# Patient Record
Sex: Female | Born: 1945 | Race: Black or African American | Hispanic: No | Smoking: Never smoker
Health system: Southern US, Community
[De-identification: ages and names within clinical notes are randomized; demographics above are authoritative.]

## PROBLEM LIST (undated history)

## (undated) DIAGNOSIS — M8580 Other specified disorders of bone density and structure, unspecified site: Secondary | ICD-10-CM

## (undated) DIAGNOSIS — E785 Hyperlipidemia, unspecified: Secondary | ICD-10-CM

## (undated) DIAGNOSIS — K625 Hemorrhage of anus and rectum: Secondary | ICD-10-CM

## (undated) DIAGNOSIS — M1990 Unspecified osteoarthritis, unspecified site: Secondary | ICD-10-CM

## (undated) DIAGNOSIS — K219 Gastro-esophageal reflux disease without esophagitis: Secondary | ICD-10-CM

## (undated) DIAGNOSIS — R1310 Dysphagia, unspecified: Secondary | ICD-10-CM

## (undated) DIAGNOSIS — Z860100 Personal history of colon polyps, unspecified: Secondary | ICD-10-CM

## (undated) DIAGNOSIS — R7303 Prediabetes: Secondary | ICD-10-CM

## (undated) DIAGNOSIS — F419 Anxiety disorder, unspecified: Secondary | ICD-10-CM

## (undated) DIAGNOSIS — C801 Malignant (primary) neoplasm, unspecified: Secondary | ICD-10-CM

## (undated) DIAGNOSIS — I10 Essential (primary) hypertension: Secondary | ICD-10-CM

## (undated) DIAGNOSIS — D869 Sarcoidosis, unspecified: Secondary | ICD-10-CM

## (undated) DIAGNOSIS — G4733 Obstructive sleep apnea (adult) (pediatric): Secondary | ICD-10-CM

## (undated) HISTORY — PX: BRONCHOSCOPY: SUR163

## (undated) HISTORY — DX: Essential (primary) hypertension: I10

## (undated) HISTORY — DX: Personal history of colonic polyps: Z86.010

## (undated) HISTORY — DX: Hyperlipidemia, unspecified: E78.5

## (undated) HISTORY — DX: Obstructive sleep apnea (adult) (pediatric): G47.33

## (undated) HISTORY — DX: Unspecified osteoarthritis, unspecified site: M19.90

## (undated) HISTORY — DX: Personal history of colon polyps, unspecified: Z86.0100

## (undated) HISTORY — DX: Gastro-esophageal reflux disease without esophagitis: K21.9

## (undated) HISTORY — DX: Dysphagia, unspecified: R13.10

## (undated) HISTORY — DX: Hemorrhage of anus and rectum: K62.5

## (undated) HISTORY — PX: TOTAL ABDOMINAL HYSTERECTOMY: SHX209

## (undated) HISTORY — DX: Other specified disorders of bone density and structure, unspecified site: M85.80

## (undated) HISTORY — DX: Sarcoidosis, unspecified: D86.9

---

## 2000-09-05 ENCOUNTER — Other Ambulatory Visit: Admission: RE | Admit: 2000-09-05 | Discharge: 2000-09-05 | Payer: Self-pay | Admitting: Obstetrics and Gynecology

## 2001-02-26 ENCOUNTER — Encounter: Admission: RE | Admit: 2001-02-26 | Discharge: 2001-02-26 | Payer: Self-pay | Admitting: *Deleted

## 2001-02-26 ENCOUNTER — Encounter: Payer: Self-pay | Admitting: *Deleted

## 2001-12-15 ENCOUNTER — Other Ambulatory Visit: Admission: RE | Admit: 2001-12-15 | Discharge: 2001-12-15 | Payer: Self-pay | Admitting: Obstetrics and Gynecology

## 2002-12-18 ENCOUNTER — Other Ambulatory Visit: Admission: RE | Admit: 2002-12-18 | Discharge: 2002-12-18 | Payer: Self-pay | Admitting: Obstetrics and Gynecology

## 2003-01-19 ENCOUNTER — Encounter: Payer: Self-pay | Admitting: *Deleted

## 2003-01-19 ENCOUNTER — Ambulatory Visit (HOSPITAL_COMMUNITY): Admission: RE | Admit: 2003-01-19 | Discharge: 2003-01-19 | Payer: Self-pay | Admitting: *Deleted

## 2003-03-03 ENCOUNTER — Ambulatory Visit (HOSPITAL_COMMUNITY): Admission: RE | Admit: 2003-03-03 | Discharge: 2003-03-03 | Payer: Self-pay | Admitting: Cardiology

## 2003-03-03 ENCOUNTER — Encounter (INDEPENDENT_AMBULATORY_CARE_PROVIDER_SITE_OTHER): Payer: Self-pay | Admitting: Cardiology

## 2003-03-15 ENCOUNTER — Ambulatory Visit (HOSPITAL_COMMUNITY): Admission: RE | Admit: 2003-03-15 | Discharge: 2003-03-15 | Payer: Self-pay | Admitting: Cardiology

## 2003-03-15 ENCOUNTER — Encounter (INDEPENDENT_AMBULATORY_CARE_PROVIDER_SITE_OTHER): Payer: Self-pay | Admitting: Cardiology

## 2003-06-11 ENCOUNTER — Ambulatory Visit: Payer: Self-pay

## 2004-04-20 ENCOUNTER — Encounter: Admission: RE | Admit: 2004-04-20 | Discharge: 2004-04-20 | Payer: Self-pay | Admitting: Internal Medicine

## 2004-09-11 ENCOUNTER — Other Ambulatory Visit: Admission: RE | Admit: 2004-09-11 | Discharge: 2004-09-11 | Payer: Self-pay | Admitting: Obstetrics and Gynecology

## 2004-11-21 ENCOUNTER — Encounter: Admission: RE | Admit: 2004-11-21 | Discharge: 2004-11-21 | Payer: Self-pay | Admitting: Cardiology

## 2004-12-06 ENCOUNTER — Ambulatory Visit: Payer: Self-pay | Admitting: Critical Care Medicine

## 2004-12-08 ENCOUNTER — Ambulatory Visit: Admission: RE | Admit: 2004-12-08 | Discharge: 2004-12-08 | Payer: Self-pay | Admitting: Critical Care Medicine

## 2004-12-08 ENCOUNTER — Encounter: Payer: Self-pay | Admitting: Critical Care Medicine

## 2004-12-12 ENCOUNTER — Ambulatory Visit (HOSPITAL_COMMUNITY): Admission: RE | Admit: 2004-12-12 | Discharge: 2004-12-12 | Payer: Self-pay | Admitting: Critical Care Medicine

## 2004-12-12 ENCOUNTER — Encounter (INDEPENDENT_AMBULATORY_CARE_PROVIDER_SITE_OTHER): Payer: Self-pay | Admitting: Specialist

## 2004-12-12 ENCOUNTER — Ambulatory Visit: Payer: Self-pay | Admitting: Critical Care Medicine

## 2004-12-28 ENCOUNTER — Ambulatory Visit: Payer: Self-pay | Admitting: Critical Care Medicine

## 2005-01-31 ENCOUNTER — Ambulatory Visit: Payer: Self-pay | Admitting: Critical Care Medicine

## 2005-02-27 ENCOUNTER — Ambulatory Visit: Payer: Self-pay | Admitting: Critical Care Medicine

## 2005-03-29 ENCOUNTER — Ambulatory Visit: Payer: Self-pay | Admitting: Critical Care Medicine

## 2005-04-18 ENCOUNTER — Ambulatory Visit: Payer: Self-pay | Admitting: Critical Care Medicine

## 2005-06-08 ENCOUNTER — Ambulatory Visit: Payer: Self-pay | Admitting: Critical Care Medicine

## 2005-08-06 ENCOUNTER — Ambulatory Visit: Payer: Self-pay | Admitting: Critical Care Medicine

## 2006-09-06 ENCOUNTER — Ambulatory Visit: Payer: Self-pay | Admitting: Critical Care Medicine

## 2006-09-11 ENCOUNTER — Encounter: Payer: Self-pay | Admitting: Critical Care Medicine

## 2006-09-11 ENCOUNTER — Ambulatory Visit: Payer: Self-pay | Admitting: Internal Medicine

## 2006-10-01 ENCOUNTER — Ambulatory Visit: Payer: Self-pay | Admitting: Critical Care Medicine

## 2006-10-11 ENCOUNTER — Ambulatory Visit: Payer: Self-pay | Admitting: Critical Care Medicine

## 2006-11-06 ENCOUNTER — Ambulatory Visit: Payer: Self-pay | Admitting: Critical Care Medicine

## 2006-11-06 LAB — CONVERTED CEMR LAB
ALT: 19 units/L (ref 0–40)
AST: 27 units/L (ref 0–37)
Albumin: 3.7 g/dL (ref 3.5–5.2)
Alkaline Phosphatase: 159 units/L — ABNORMAL HIGH (ref 39–117)
Basophils Absolute: 0 10*3/uL (ref 0.0–0.1)
Basophils Relative: 0.8 % (ref 0.0–1.0)
Bilirubin, Direct: 0.1 mg/dL (ref 0.0–0.3)
Eosinophils Absolute: 0.2 10*3/uL (ref 0.0–0.6)
Eosinophils Relative: 5.1 % — ABNORMAL HIGH (ref 0.0–5.0)
HCT: 36.6 % (ref 36.0–46.0)
Hemoglobin: 12.4 g/dL (ref 12.0–15.0)
Lymphocytes Relative: 30.8 % (ref 12.0–46.0)
MCHC: 33.8 g/dL (ref 30.0–36.0)
MCV: 82.3 fL (ref 78.0–100.0)
Monocytes Absolute: 0.4 10*3/uL (ref 0.2–0.7)
Monocytes Relative: 7.6 % (ref 3.0–11.0)
Neutro Abs: 2.8 10*3/uL (ref 1.4–7.7)
Neutrophils Relative %: 55.7 % (ref 43.0–77.0)
Platelets: 286 10*3/uL (ref 150–400)
RBC: 4.45 M/uL (ref 3.87–5.11)
RDW: 14.3 % (ref 11.5–14.6)
Total Bilirubin: 0.7 mg/dL (ref 0.3–1.2)
Total Protein: 8.9 g/dL — ABNORMAL HIGH (ref 6.0–8.3)
WBC: 4.9 10*3/uL (ref 4.5–10.5)

## 2006-12-04 ENCOUNTER — Ambulatory Visit: Payer: Self-pay | Admitting: Critical Care Medicine

## 2006-12-04 LAB — CONVERTED CEMR LAB
Basophils Absolute: 0 10*3/uL (ref 0.0–0.1)
Basophils Relative: 0.7 % (ref 0.0–1.0)
Eosinophils Absolute: 0.2 10*3/uL (ref 0.0–0.6)
Eosinophils Relative: 5.8 % — ABNORMAL HIGH (ref 0.0–5.0)
HCT: 34.4 % — ABNORMAL LOW (ref 36.0–46.0)
Hemoglobin: 11.8 g/dL — ABNORMAL LOW (ref 12.0–15.0)
Lymphocytes Relative: 34.7 % (ref 12.0–46.0)
MCHC: 34.3 g/dL (ref 30.0–36.0)
MCV: 83 fL (ref 78.0–100.0)
Monocytes Absolute: 0.2 10*3/uL (ref 0.2–0.7)
Monocytes Relative: 6 % (ref 3.0–11.0)
Neutro Abs: 2.1 10*3/uL (ref 1.4–7.7)
Neutrophils Relative %: 52.8 % (ref 43.0–77.0)
Platelets: 227 10*3/uL (ref 150–400)
RBC: 4.15 M/uL (ref 3.87–5.11)
RDW: 15.7 % — ABNORMAL HIGH (ref 11.5–14.6)
WBC: 3.8 10*3/uL — ABNORMAL LOW (ref 4.5–10.5)

## 2007-01-21 ENCOUNTER — Ambulatory Visit: Payer: Self-pay | Admitting: Critical Care Medicine

## 2007-03-21 DIAGNOSIS — G4733 Obstructive sleep apnea (adult) (pediatric): Secondary | ICD-10-CM | POA: Insufficient documentation

## 2007-03-21 DIAGNOSIS — D869 Sarcoidosis, unspecified: Secondary | ICD-10-CM | POA: Insufficient documentation

## 2007-03-21 DIAGNOSIS — D860 Sarcoidosis of lung: Secondary | ICD-10-CM

## 2007-03-21 DIAGNOSIS — I10 Essential (primary) hypertension: Secondary | ICD-10-CM | POA: Insufficient documentation

## 2007-03-21 DIAGNOSIS — I129 Hypertensive chronic kidney disease with stage 1 through stage 4 chronic kidney disease, or unspecified chronic kidney disease: Secondary | ICD-10-CM | POA: Insufficient documentation

## 2007-03-21 DIAGNOSIS — E785 Hyperlipidemia, unspecified: Secondary | ICD-10-CM | POA: Insufficient documentation

## 2007-03-21 HISTORY — DX: Obstructive sleep apnea (adult) (pediatric): G47.33

## 2007-03-21 HISTORY — DX: Sarcoidosis of lung: D86.0

## 2007-03-21 HISTORY — DX: Hypertensive chronic kidney disease with stage 1 through stage 4 chronic kidney disease, or unspecified chronic kidney disease: I12.9

## 2007-03-21 HISTORY — DX: Essential (primary) hypertension: I10

## 2007-05-27 ENCOUNTER — Encounter: Admission: RE | Admit: 2007-05-27 | Discharge: 2007-05-27 | Payer: Self-pay | Admitting: Internal Medicine

## 2007-06-11 ENCOUNTER — Ambulatory Visit: Payer: Self-pay | Admitting: Critical Care Medicine

## 2007-09-17 ENCOUNTER — Ambulatory Visit: Payer: Self-pay | Admitting: Critical Care Medicine

## 2007-09-22 ENCOUNTER — Encounter: Payer: Self-pay | Admitting: Critical Care Medicine

## 2007-10-06 ENCOUNTER — Ambulatory Visit: Payer: Self-pay | Admitting: Critical Care Medicine

## 2008-02-05 ENCOUNTER — Ambulatory Visit: Payer: Self-pay | Admitting: Critical Care Medicine

## 2008-02-16 ENCOUNTER — Encounter: Payer: Self-pay | Admitting: Critical Care Medicine

## 2008-02-17 ENCOUNTER — Encounter: Payer: Self-pay | Admitting: Critical Care Medicine

## 2008-02-18 ENCOUNTER — Encounter: Payer: Self-pay | Admitting: Critical Care Medicine

## 2008-06-02 ENCOUNTER — Ambulatory Visit: Payer: Self-pay | Admitting: Critical Care Medicine

## 2009-07-06 ENCOUNTER — Encounter: Admission: RE | Admit: 2009-07-06 | Discharge: 2009-07-14 | Payer: Self-pay | Admitting: Internal Medicine

## 2009-07-12 ENCOUNTER — Encounter: Admission: RE | Admit: 2009-07-12 | Discharge: 2009-07-12 | Payer: Self-pay | Admitting: Critical Care Medicine

## 2010-03-08 ENCOUNTER — Ambulatory Visit (HOSPITAL_BASED_OUTPATIENT_CLINIC_OR_DEPARTMENT_OTHER): Admission: RE | Admit: 2010-03-08 | Discharge: 2010-03-08 | Payer: Self-pay | Admitting: Neurology

## 2010-03-08 ENCOUNTER — Encounter: Payer: Self-pay | Admitting: Internal Medicine

## 2010-03-11 ENCOUNTER — Ambulatory Visit: Payer: Self-pay | Admitting: Internal Medicine

## 2010-05-03 ENCOUNTER — Ambulatory Visit: Payer: Self-pay | Admitting: Internal Medicine

## 2010-05-06 DIAGNOSIS — E118 Type 2 diabetes mellitus with unspecified complications: Secondary | ICD-10-CM

## 2010-05-06 DIAGNOSIS — E119 Type 2 diabetes mellitus without complications: Secondary | ICD-10-CM | POA: Insufficient documentation

## 2010-05-06 DIAGNOSIS — N182 Chronic kidney disease, stage 2 (mild): Secondary | ICD-10-CM | POA: Insufficient documentation

## 2010-05-06 DIAGNOSIS — E1122 Type 2 diabetes mellitus with diabetic chronic kidney disease: Secondary | ICD-10-CM | POA: Insufficient documentation

## 2010-05-06 HISTORY — DX: Type 2 diabetes mellitus without complications: E11.9

## 2010-06-06 ENCOUNTER — Encounter: Payer: Self-pay | Admitting: Internal Medicine

## 2010-06-13 ENCOUNTER — Ambulatory Visit
Admission: RE | Admit: 2010-06-13 | Discharge: 2010-06-13 | Payer: Self-pay | Source: Home / Self Care | Attending: Internal Medicine | Admitting: Internal Medicine

## 2010-07-04 ENCOUNTER — Encounter: Payer: Self-pay | Admitting: Internal Medicine

## 2010-07-04 NOTE — Assessment & Plan Note (Signed)
Summary: sleep/cb   Primary Provider/Referring Provider:  Dr. Dorothyann Peng  CC:  Sleep Consult-Dr. Vela Prose.  History of Present Illness: May 03, 2010- 65 yoF referred courtesy of Dr lewitt for concern of insomnia and sleep apnea. Her complaint is that she sleeps no more than 3-5 hrs/ night. She is alone and not told if she snores or kicks. She hasn't felt she slept well in about 8 years, blaming work stress until problems persisted after she retired in 2006. Dx'd with sleep apnea in 2004. Wears CPAP irregularly. Comfortable in bed.  Bedtime 10PM 30 minutes sleep latency, wakes 33-4 x/ night before up between 5-7AM. NPSG10/5/11- AHI 19.8/ hr moderate obstructive apnea. CPAP titrated to 10 cwp for AHI 0.8/hr.  Preventive Screening-Counseling & Management  Alcohol-Tobacco     Smoking Status: never  Current Medications (verified): 1)  Lescol Xl 80 Mg  Tb24 (Fluvastatin Sodium) .Marland Kitchen.. 1 Tab By Mouth Daily 2)  Felodipine 10 Mg Xr24h-Tab (Felodipine) .... Take 1 By Mouth Once Daily 3)  Vitamin D3 2000 Unit Caps (Cholecalciferol) .... Take 1 By Mouth Once Daily 4)  Glucosamine-Chondroitin  Caps (Glucosamine-Chondroit-Vit C-Mn) .... Take 2 By Mouth Once Daily 5)  Fish Oil 1000 Mg Caps (Omega-3 Fatty Acids) .... Take 2 By Mouth Once Daily 6)  Cpap .... 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 CPAP 10 HYPERTENSION (ICD-401.9) HYPERLIPIDEMIA (ICD-272.4) Diabetes, Type 2  Past Surgical History: Total Abdominal Hysterectomy Bronchoscopy- dx'd sarcoid  Family History: Allergies: sister,mother,daughter Asthma: daughter Heart Disease: irregular heartbeats, uncle and sister both havehad by heart  surgery(blockages) Father died accidental , did snore Mother- living  Social History: Divorced; lives with 3 daughters non smoker  No ETOH  Review of Systems      See HPI       The patient complains of shortness of breath with activity, non-productive cough, irregular heartbeats, indigestion, nasal congestion/difficulty breathing through nose, ear ache, and joint stiffness or pain.  The patient denies shortness of breath at rest, productive cough, coughing up blood, chest pain, acid heartburn, loss of appetite, weight change, abdominal pain, difficulty swallowing, sore throat, tooth/dental problems, headaches, sneezing, itching, anxiety, depression, hand/feet swelling, rash, change in color of mucus, and fever.    Vital Signs:  Patient profile:   65 year old female Height:      59 inches Weight:      157 pounds BMI:     31.82 O2 Sat:      97 % on Room air Pulse rate:   71 / minute BP sitting:   128 / 66  (left arm) Cuff size:   regular  Vitals Entered By: Reynaldo Minium CMA (May 03, 2010 10:37 AM)  O2 Flow:  Room air CC: Sleep Consult-Dr. Vela Prose   Physical Exam  Additional Exam:  General: A/Ox3; pleasant and cooperative, NAD, medium build SKIN: no rash, lesions NODES: no lymphadenopathy HEENT: [place]/AT, EOM- WNL, Conjuctivae- clear, PERRLA, TM-WNL, Nose- clear, Throat- clear and wnl NECK: Supple w/ fair ROM, JVD- none, normal carotid impulses w/o bruits Thyroid- normal to palpation CHEST: Clear to P&A HEART: RRR, no m/g/r heard ABDOMEN: No hepatosplenomegaly found. ZOX:WRUE, nl pulses, no edema  NEURO: Grossly intact to observation  Impression & Recommendations:  Problem # 1:  OBSTRUCTIVE SLEEP APNEA (ICD-327.23) We will start CPAP again. Her original company [employer], is out of business here. We will address her insomnia w/ a trial of temazepam, noting that she has found some meds including ambien have seemed to depress her. We discussed medical concerns,  importance of weight control and responsibility to drive carefully.   Medications Added to Medication List This Visit: 1)  Felodipine 10 Mg Xr24h-tab (Felodipine) .... Take 1 by mouth once daily 2)  Vitamin D3 2000 Unit Caps (Cholecalciferol) .... Take 1 by mouth once daily 3)  Glucosamine-chondroitin Caps (Glucosamine-chondroit-vit c-mn) .... Take 2 by mouth once daily 4)  Fish Oil 1000 Mg Caps (Omega-3 fatty acids) .... Take 2 by mouth once daily 5)  Cpap  .... Set on 2 6)  Temazepam 15 Mg Caps (Temazepam) .Marland Kitchen.. 1 for sleep if needed  Other Orders: Consultation Level IV (16109) DME Referral (DME)  Patient Instructions: 1)  Please schedule a follow-up appointment in 1 month. 2)  See PCC to start CPAP 3)  Script for trial of temaxzepam as a sleep aid to try if you wish.  4)  cc Dr Vela Prose, Dr Allyne Gee Prescriptions: TEMAZEPAM 15 MG CAPS (TEMAZEPAM) 1 for sleep if needed  #10 x 0   Entered and Authorized by:   Waymon Budge MD   Signed by:   Waymon Budge MD on 05/03/2010   Method used:   Print then Give to Patient   RxID:   [id number]

## 2010-07-06 NOTE — Assessment & Plan Note (Signed)
Summary: rov 1 month///kp   Primary Provider/Referring Provider:  Dr. Dorothyann Peng  CC:  1 month follow up visit-sleep; Using CPAP each night but wondering if pressure is too much..  History of Present Illness: History of Present Illness: May 03, 2010- 64 yoF referred courtesy of Dr lewitt for concern of insomnia and sleep apnea. Her complaint is that she sleeps no more than 3-5 hrs/ night. She is alone and not told if she snores or kicks. She hasn't felt she slept well in about 8 years, blaming work stress until problems persisted after she retired in 2006. Dx'd with sleep apnea in 2004. Wears CPAP irregularly. Comfortable in bed.  Bedtime 10PM 30 minutes sleep latency, wakes 33-4 x/ night before up between 5-7AM. NPSG10/5/11- AHI 19.8/ hr moderate obstructive apnea. CPAP titrated to 10 cwp for AHI 0.8/hr.  June 13, 2010- Insomnia, OSA Nurse-CC: 1 month follow up visit-sleep; Using CPAP each night but wondering if pressure is too much. At last visit we restarted CPAP and gave temazepam. CPAP seems too high with aerophagia. She is using it all night, every night and recognizes she is sleeping better with it. Temazepam didn't help sleep and caused crying spells similar to other sleep meds. We discussed trial of Silenor as a different chemistry worth trying.     Preventive Screening-Counseling & Management  Alcohol-Tobacco     Smoking Status: never  Current Medications (verified): 1)  Lescol Xl 80 Mg  Tb24 (Fluvastatin Sodium) .Marland Kitchen.. 1 Tab By Mouth Daily 2)  Felodipine 10 Mg Xr24h-Tab (Felodipine) .... Take 1 By Mouth Once Daily 3)  Vitamin D3 2000 Unit Caps (Cholecalciferol) .... Take 1 By Mouth Once Daily 4)  Glucosamine-Chondroitin  Caps (Glucosamine-Chondroit-Vit C-Mn) .... Take 2 By Mouth Once Daily 5)  Fish Oil 1000 Mg Caps (Omega-3 Fatty Acids) .... Take 2 By Mouth Once Daily 6)  Cpap .... Set On 2  Allergies (verified): No Known Drug Allergies  Vital Signs:  Patient  profile:   65 year old female Height:      59 inches Weight:      155.50 pounds BMI:     31.52 O2 Sat:      98 % on Room air Pulse rate:   73 / minute BP sitting:   136 / 70  (left arm) Cuff size:   regular  Vitals Entered By: Reynaldo Minium CMA (June 13, 2010 11:08 AM)  O2 Flow:  Room air CC: 1 month follow up visit-sleep; Using CPAP each night but wondering if pressure is too much.   Physical Exam  Additional Exam:  General: A/Ox3; pleasant and cooperative, NAD, medium build, very intelligent woman.  SKIN: no rash, lesions NODES: no lymphadenopathy HEENT: [place]/AT, EOM- WNL, Conjuctivae- clear, PERRLA, TM-WNL, Nose- clear, Throat- clear and wnl, Mallampati  II-III NECK: Supple w/ fair ROM, JVD- none, normal carotid impulses w/o bruits Thyroid- normal to palpation CHEST: Clear to P&A HEART: RRR, no m/g/r heard ABDOMEN: No hepatosplenomegaly found. KVQ:QVZD, nl pulses, no edema  NEURO: Grossly intact to observation      Impression & Recommendations:  Problem # 1:  OBSTRUCTIVE SLEEP APNEA (ICD-327.23) Compliance and control were good on 10 cwp as of download June 12, 2010.  We will reduce her pressure to 9 to reduce air swallowing.  Her chronic insomnia is not just related to OSA. We discussed sleep hygiene and will give a trial of Silenor samples.   Problem # 2:  SARCOIDOSIS (ICD-135) This is clincially inactive. She does  notice some seasonal congestion not likely related to any active concern now, but she notices it when she has to take cpap off to cough.   Medications Added to Medication List This Visit: 1)  Cpap 10 Cwp  2)  Cpap 9 Cwp Sms   Other Orders: Est. Patient Level III (52841) DME Referral (DME) Influenza Vaccine MCR (32440)  Patient Instructions: 1)  Please schedule a follow-up appointment in 4 months. 2)  We will have your CPAP pressure reduced to 9.  3)  Try samples of Silenor 6 mg, one each night, for sleep.  4)  Flu vax   Immunizations  Administered:  Influenza Vaccine # 1:    Vaccine Type: Fluvax MCR    Site: left deltoid    Mfr: GlaxoSmithKline    Dose: 0.5 ml    Route: IM    Given by: Carver Fila    Exp. Date: 12/02/2010    Lot #: [id number]  Flu Vaccine Consent Questions:    Do you have a history of severe allergic reactions to this vaccine? no    Any prior history of allergic reactions to egg and/or gelatin? no    Do you have a sensitivity to the preservative Thimersol? no    Do you have a past history of Guillan-Barre Syndrome? no    Do you currently have an acute febrile illness? no    Have you ever had a severe reaction to latex? no    Vaccine information given and explained to patient? yes    Are you currently pregnant? no

## 2010-10-06 ENCOUNTER — Encounter: Payer: Self-pay | Admitting: Internal Medicine

## 2010-10-10 ENCOUNTER — Encounter: Payer: Self-pay | Admitting: Internal Medicine

## 2010-10-10 ENCOUNTER — Ambulatory Visit (INDEPENDENT_AMBULATORY_CARE_PROVIDER_SITE_OTHER)
Admission: RE | Admit: 2010-10-10 | Discharge: 2010-10-10 | Disposition: A | Discharge status: Home / Self Care | Payer: Medicare Other | Source: Ambulatory Visit | Attending: Internal Medicine | Admitting: Internal Medicine

## 2010-10-10 ENCOUNTER — Ambulatory Visit (INDEPENDENT_AMBULATORY_CARE_PROVIDER_SITE_OTHER): Payer: Medicare Other | Admitting: Internal Medicine

## 2010-10-10 VITALS — BP 146/76 | HR 65 | Ht 59.0 in | Wt 153.8 lb

## 2010-10-10 DIAGNOSIS — G4733 Obstructive sleep apnea (adult) (pediatric): Secondary | ICD-10-CM

## 2010-10-10 DIAGNOSIS — D869 Sarcoidosis, unspecified: Secondary | ICD-10-CM

## 2010-10-10 NOTE — Assessment & Plan Note (Signed)
Control is good, but there is room to drop her pressure one more step and I would like to see if that can make her airswallowing and cough better. Will reduce pressure to 8.

## 2010-10-10 NOTE — Patient Instructions (Signed)
Orders-   1) PCC- reduce CPAP to 8 for comfort                   2) CXR- for dx sarcoid

## 2010-10-10 NOTE — Progress Notes (Signed)
  Subjective:    Patient ID: Mariah Burns, female    DOB: [birth year], 65 y.o.   MRN: [id number]  HPI 10/10/10- 89 yoF followed with hx OSA and Sarcoid, complicated by HTN and DM.  Last here June 13, 2010- We had reduced CPAP to 9 and compliance check looked good on that.  Mentions some vertigo on first getting up in AM. Asks about trying a little lower pressure.  Sometimes a nonproductive morning cough - discussed indication for cxr.    Review of Systems Constitutional:   No weight loss, night sweats,  Fevers, chills, fatigue, lassitude. HEENT:   No headaches,  Difficulty swallowing,  Tooth/dental problems,  Sore throat,                No sneezing, itching, ear ache, nasal congestion, post nasal drip,   CV:  No chest pain,  Orthopnea, PND, swelling in lower extremities, anasarca, dizziness, palpitations  GI  No heartburn, indigestion, abdominal pain, nausea, vomiting, diarrhea, change in bowel habits, loss of appetite  Resp: No shortness of breath with exertion or at rest.  No excess mucus, No coughing up of blood.  No change in color of mucus.  No wheezing.    Skin: no rash or lesions.  GU: no dysuria, change in color of urine, no urgency or frequency.  No flank pain.  MS:  .  No decreased range of motion.  No back pain.  Psych:  No change in mood or affect. No depression or anxiety.  No memory loss.      Objective:   Physical Exam General- Alert, Oriented, Affect-appropriate, Distress- none acute  Skin- rash-none, lesions- none, excoriation- none  Lymphadenopathy- none  Head- atraumatic  Eyes- Gross vision intact, PERRLA, conjunctivae clear, secretions  Ears- Hearing, canals, Tm L ,   R ,-normal  Nose- Clear,No-Septal dev, mucus, polyps, erosion, perforation   Throat- Mallampati II-III, mucosa clear , drainage- none, tonsils- atrophic  Neck- flexible , trachea midline, no stridor , thyroid nl, carotid no bruit  Chest - symmetrical excursion , unlabored   Heart/CV- RRR , no murmur , no gallop  , no rub, nl s1 s2                     - JVD- none , edema- none, stasis changes- none, varices- none     Lung- clear to P&A, wheeze- none, cough- none , dullness-none, rub- none     Chest wall-  Abd- tender-no, distended-no, bowel sounds-present, HSM- no  Br/ Gen/ Rectal- Not done, not indicated  Extrem- cyanosis- none, clubbing, none, atrophy- none, strength- nl  Neuro- grossly intact to observation         Assessment & Plan:

## 2010-10-10 NOTE — Assessment & Plan Note (Signed)
Mild persistent cough. We will update CXR.

## 2010-10-13 ENCOUNTER — Telehealth: Payer: Self-pay | Admitting: Internal Medicine

## 2010-10-13 NOTE — Progress Notes (Signed)
Quick Note:  LMTCB ______ 

## 2010-10-13 NOTE — Telephone Encounter (Signed)
CXR- scarring from Sarcoid. Probably nothing new. There is some fullness that is probably a lymph node, but we can look at it again in the future.   Pt called back and advised her of cxr results. Pt verbalized understanding and had no questions.

## 2010-10-13 NOTE — Telephone Encounter (Signed)
LMTCB- need to advise of cxr results per append.

## 2010-10-15 ENCOUNTER — Encounter: Payer: Self-pay | Admitting: Internal Medicine

## 2010-10-17 NOTE — Assessment & Plan Note (Signed)
[facility]                             PULMONARY OFFICE NOTE   ADONNA, HORSLEY                    MRN:          [id number]  DATE:01/21/2007                            DOB:          Nov 17, 1945    Ms. Riles is a 65 year old African-American female with history of  sarcoidosis.  We attempted to give her systemic Imuran after she failed  prednisone.  She had significant side effects of dizziness and nausea on  the Imuran.  All these symptoms have resolved since the Imuran has been  stopped.  Her fatigue and lethargy are improved, as well.  Her shortness  of breath is stable.  She still has some joint discomfort in the hands  and ankles.  She maintains Lescol 80 mg daily, Plendil 5 mg daily.   EXAM:  Temperature 98, blood pressure 120/78, pulse 79, saturation 97%  on room air.  CHEST:  Showed that to be clear without evidence of wheeze, rale or  other adventitious breath sounds.  CARDIAC EXAM:  Showed a regular rate and rhythm without S3, normal S1  and S2.  ABDOMEN:  Soft, nontender.  EXTREMITIES:  Showed no edema or clubbing.  SKIN:  Clear.  NEUROLOGIC EXAM:  Intact.   IMPRESSION:  Sarcoidosis, stable at this time.   PLAN:  The plan for this patient is to maintain off systemic anti-  inflammatory therapy at this time and we will see the patient back in  return followup.     Charlcie Cradle Delford Field, MD, [facility]  Electronically Signed    PEW/MedQ  DD: 01/21/2007  DT: 01/22/2007  Job #: [id number]

## 2010-10-17 NOTE — Assessment & Plan Note (Signed)
[facility]                             PULMONARY OFFICE NOTE   SARIN, COMUNALE                    MRN:          [id number]  DATE:12/04/2006                            DOB:          08-Jul-1945    [name] is a 65 year old African-American female with a history of  sarcoidosis, obstructive sleep apnea.  We had tried her on Imuran,  starting in March.  Unfortunately, over the last several weeks, she has  had increased nausea and is unable to tolerate the Imuran, also noting  dizziness.  Since stopping the Imuran, the nausea and dizziness have  resolved.  She is only on the Lescol 80 mg daily and Plendil 5 mg daily.   On exam, temp 98, blood pressure 128/70, pulse 79, saturation 97% on  room air.   PHYSICAL EXAMINATION:  GENERAL:  This is a mildly obese female in no  distress.  VITAL SIGNS:  Temp 98, blood pressure 140/76, pulse 78, saturation 96%  on room air.  CHEST:  A few dry rales at the bases, otherwise clear.  CARDIAC:  Regular rate and rhythm.  ABDOMEN:  Soft, nontender.  EXTREMITIES:  No edema or clubbing.   Labs today show a white count of 3.8, hemoglobin 11.8, platelet count  227,000.   IMPRESSION:  Sarcoidosis.  She actually had a positive response to  Imuran therapy but unfortunately has evolved multiple side effects and  now will need to stop the Imuran and no longer utilize it.  We will see  the patient back in followup in six weeks and make a determination  whether she may be a candidate for Plaquenil.     Charlcie Cradle Delford Field, MD, [facility]  Electronically Signed    PEW/MedQ  DD: 12/05/2006  DT: 12/05/2006  Job #: [id number]   cc:   Candyce Churn. Allyne Gee, M.D.

## 2010-10-17 NOTE — Assessment & Plan Note (Signed)
[facility]                             PULMONARY OFFICE NOTE   Mariah Burns, Mariah Burns                    MRN:          [id number]  DATE:10/11/2006                            DOB:          1945-07-01    [name] is a 65 year old African-American female with advanced  sarcoidosis, obstructive sleep apnea.  She is here to go over pulmonary  function results, which reveal a drop in her diffusion capacity of 30%  between 2006 and 2008.  Lung capacity is stable at 84% predicted.  The  spirometry shows an FE of 75-85 that has dropped by 20%.  She has now  started Imuran 100 mg daily.  She maintains Lescol and Plendil daily.   EXAMINATION:  VITAL SIGNS:  Temp 98, blood pressure 128/70, pulse 79,  saturation was 97% on room air.  CHEST:  Showed distant breath sounds with prolonged expiratory phase.  No wheezes or rhonchi were noted.  CARDIAC:  Showed a regular rate and rhythm without S3, normal S1, S2.  ABDOMEN:  Soft, nontender.  EXTREMITIES:  Showed no edema, clubbing or venous disease.  SKIN:  Clear.  NEUROLOGIC:  Intact.  HEENT:  Showed no JVD, no lymphadenopathy, oropharynx clear.  NECK:  Supple.   IMPRESSION:  That of sarcoidosis with poor response to prednisone  therapy.   PLAN:  To continue Imuran 100 mg daily.  She will return in 6 weeks for  a CBC and hepatic function panel.     Charlcie Cradle Delford Field, MD, [facility]  Electronically Signed    PEW/MedQ  DD: 10/11/2006  DT: 10/12/2006  Job #: [id number]   cc:   Candyce Churn. Allyne Gee, M.D.

## 2010-10-17 NOTE — Assessment & Plan Note (Signed)
[facility]                             PULMONARY OFFICE NOTE   Mariah Burns, Mariah Burns                    MRN:          [id number]  DATE:11/06/2006                            DOB:          27-Dec-1945    Mariah Burns is a 65 year old female with history of sarcoidosis,  obstructive sleep apnea. She has had continued loss of energy level and  is still short of breath with walking. She has been on Imuran now for  one month at 100 mg daily. Maintains Plendil 5 mg daily, Lescol XL 80 mg  daily.   PHYSICAL EXAMINATION:  Temperature 97.8, blood pressure 130/76, pulse  81, saturation 97% room air.  CHEST: Showed diminished breath sounds. No wheeze or rhonchi noted.  CARDIAC: Showed a regular rate and rhythm without S3. Normal S1, S2.  ABDOMEN: Soft, nontender.  EXTREMITIES: Showed no edema or clubbing.  SKIN: Was clear.   IMPRESSION:  Sarcoidosis, indeterminate response as of yet to Imuran  therapy.   PLAN:  For this patient is to maintain Imuran 100 mg daily and we will  check a CBC and liver function profile today in the office and she will  return in six weeks.     Charlcie Cradle Delford Field, MD, [facility]  Electronically Signed    PEW/MedQ  DD: 11/06/2006  DT: 11/06/2006  Job #: [id number]

## 2010-10-20 NOTE — Op Note (Signed)
NAME:  LATONDRA, GEBHART NO.:  [id number]   MEDICAL RECORD NO.:  [id number]          PATIENT TYPE:  AMB   LOCATION:  ENDO                         FACILITY:  [facility]   PHYSICIAN:  Shan Levans, M.D. LHCDATE OF BIRTH:  Apr 03, 1946   DATE OF PROCEDURE:  12/12/2004  DATE OF DISCHARGE:                                 OPERATIVE REPORT   INDICATIONS:  Bilateral pulmonary nodules, evaluate for cause.   OPERATOR:  Shan Levans, M.D.   ANESTHESIA:  One percent Xylocaine local.   PREOPERATIVE MEDICATION:  Demerol 30 mg IV, Versed 3 mg IV.   PROCEDURE:  The Olympus video bronchoscope was introduced via the right  naris. The upper airways were visualized and were unremarkable. The entire  tracheobronchial tree was visualized and revealed diffuse endotracheal  nodular lesions seen compatible with sarcoidosis. Attention was then paid to  the right lower lobe. Transbronchial biopsies to the lateral segment were  obtained x5. Bronchial washings were obtained.   COMPLICATIONS:  None.   IMPRESSION:  Bilateral endobronchial lesions and bilateral pulmonary nodule  changes compatible with sarcoidosis.   RECOMMENDATIONS:  Follow up pathologic data.       PW/MEDQ  D:  12/12/2004  T:  12/12/2004  Job:  [id number]

## 2010-10-20 NOTE — Assessment & Plan Note (Signed)
[facility]                             PULMONARY OFFICE NOTE   Mariah, Burns                    MRN:          [id number]  DATE:09/06/2006                            DOB:          June 01, 1946    Mariah Burns is a 65 year old, African-American female, a history of  sarcoidosis and obstructive sleep apnea.  She has had pulmonary  involvement, had adverse effects to corticosteroids.  We last saw her in  March of 2007.  At that time, we recommended Imuran; however, she failed  to fill the prescription.  She maintains Lescol 80 mg daily, Plendil 5  mg daily.  Despite not filling the Imuran, her cough and shortness of  breath have actually improved over the past year, and she is currently  not having active respiratory complaints.   PHYSICAL EXAMINATION:  GENERAL:  This is a middle-aged, African-American  female in no distress.  VITAL SIGNS:  Temp 98, blood pressure 168/82, pulse 88, saturation 98%  room air.  CHEST:  Completely clear to auscultation, and there was no evidence of  wheeze or rhonchi.  CARDIAC:  Regular rate and rhythm without S3, normal S1 S2.  ABDOMEN:  Soft and nontender.  EXTREMITIES:  No edema or clubbing.  SKIN:  Clear.   IMPRESSION:  Sarcoidosis, stable at this time.   PLAN:  Obtain a surveillance CT scan of the chest and a full set of  pulmonary function studies, and then she will return in followup.     Charlcie Cradle Delford Field, MD, [facility]  Electronically Signed    PEW/MedQ  DD: 09/06/2006  DT: 09/06/2006  Job #: [id number]   cc:   Candyce Churn. Allyne Gee, M.D.  Kathryne Hitch, MD

## 2011-04-10 ENCOUNTER — Ambulatory Visit (INDEPENDENT_AMBULATORY_CARE_PROVIDER_SITE_OTHER): Payer: Medicare Other | Admitting: Internal Medicine

## 2011-04-10 ENCOUNTER — Encounter: Payer: Self-pay | Admitting: Internal Medicine

## 2011-04-10 VITALS — BP 138/78 | HR 67 | Ht 59.0 in | Wt 155.8 lb

## 2011-04-10 DIAGNOSIS — G4733 Obstructive sleep apnea (adult) (pediatric): Secondary | ICD-10-CM

## 2011-04-10 DIAGNOSIS — Z23 Encounter for immunization: Secondary | ICD-10-CM

## 2011-04-10 DIAGNOSIS — G4730 Sleep apnea, unspecified: Secondary | ICD-10-CM

## 2011-04-10 NOTE — Patient Instructions (Signed)
[facility]  [facility] cwp

## 2011-04-10 NOTE — Progress Notes (Signed)
Subjective:    Patient ID: Mariah Burns, female    DOB: 25-Feb-1946, 65 y.o.   MRN: [id number]  HPI 10/10/10- 73 yoF followed with hx OSA and Sarcoid, complicated by HTN and DM.  Last here June 13, 2010- We had reduced CPAP to 9 and compliance check looked good on that.  Mentions some vertigo on first getting up in AM. Asks about trying a little lower pressure.  Sometimes a nonproductive morning cough - discussed indication for cxr.   04/10/11- 64 yoF followed with hx OSA and Sarcoid, complicated by HTN and DM.  We discussed flu vaccine. She is using CPAP but says some days it makes her cough or burp or feel bloated. She asks help with sleep maintenance. Sleep onset is good but she wakes after a couple of hours. The problem may be partly uncomfortable CPAP. Her mouth gets dry despite using Biotene.  Review of Systems Constitutional:   No-   weight loss, night sweats, fevers, chills, fatigue, lassitude. HEENT:   No-  headaches, difficulty swallowing, tooth/dental problems, sore throat,       No-  sneezing, itching, ear ache, nasal congestion, post nasal drip,  CV:  No-   chest pain, orthopnea, PND, swelling in lower extremities, anasarca, dizziness, palpitations Resp: No-   shortness of breath with exertion or at rest.              No-   productive cough,  No non-productive cough,  No- coughing up of blood.              No-   change in color of mucus.  No- wheezing.   Skin: No-   rash or lesions. GI:  No-   heartburn, indigestion, abdominal pain, nausea, vomiting, diarrhea,                 change in bowel habits, loss of appetite GU: No-   dysuria, change in color of urine, no urgency or frequency.  No- flank pain. MS:  No-   joint pain or swelling.  No- decreased range of motion.  No- back pain. Neuro-     nothing unusual Psych:  No- change in mood or affect. No depression or anxiety.  No memory loss.      Objective:   Physical Exam General- Alert, Oriented, Affect-appropriate,  Distress- none acute; medium build Skin- rash-none, lesions- none, excoriation- none Lymphadenopathy- none Head- atraumatic            Eyes- Gross vision intact, PERRLA, conjunctivae clear secretions            Ears- Hearing, canals-normal            Nose- Clear, no-Septal dev, mucus, polyps, erosion, perforation             Throat- Mallampati II , mucosa clear , drainage- none, tonsils- atrophic Neck- flexible , trachea midline, no stridor , thyroid nl, carotid no bruit Chest - symmetrical excursion , unlabored           Heart/CV- RRR , no murmur , no gallop  , no rub, nl s1 s2                           - JVD- none , edema- none, stasis changes- none, varices- none           Lung- clear to P&A, wheeze- none, cough- none , dullness-none, rub- none  Chest wall-  Abd- tender-no, distended-no, bowel sounds-present, HSM- no Br/ Gen/ Rectal- Not done, not indicated Extrem- cyanosis- none, clubbing, none, atrophy- none, strength- nl Neuro- grossly intact to observation

## 2011-04-12 NOTE — Assessment & Plan Note (Signed)
Her sleep apnea is controlled but it sounds as if her CPAP was uncomfortably high. We're going to reduce pressure from 8-7. If this does not help we will auto titrate. Flu vaccine.

## 2011-05-01 ENCOUNTER — Telehealth: Payer: Self-pay | Admitting: Internal Medicine

## 2011-05-01 NOTE — Telephone Encounter (Signed)
LMOMTCB x 1 

## 2011-05-01 NOTE — Telephone Encounter (Signed)
I spoke with the patient and she states she has not heard from [facility] about setting cpap pressure. I ATCx1 and line was busy I WCB. Carron Curie, CMA

## 2011-05-01 NOTE — Telephone Encounter (Signed)
Pt can be reached at [phone].  Mariah Burns

## 2011-05-01 NOTE — Telephone Encounter (Signed)
Second Request faxed to [facility] at [phone] to contact patient ASAP to reduce CPAP pressure to 7 cwp.

## 2011-05-01 NOTE — Telephone Encounter (Signed)
Pt is aware and we will forward the msg to the PCC's so they have the new fax number.

## 2011-05-01 NOTE — Telephone Encounter (Signed)
SMS local phone # has been disconnected and was giving # [phone] to call. I called them and they state they never received fax on pt to decrease CPAP pressure. SMS states to refax the order to [phone]. LM for pt to call back to make her aware. Will forward ot [facility] so they can refax order as well

## 2011-05-02 NOTE — Telephone Encounter (Signed)
LMOAM for Mariah Burns with [facility] about this issue. Truddie Crumble that I faxed over a second request last evening. Asked that patient be contacted today to arrange this pressure change. Spoke with patient and advised her of the above. Asked patient that if she hasn't heard from anyone from [facility] by 4:30 pm today to call our office back and ask to speak with me. Pt voiced understanding.

## 2011-09-03 HISTORY — PX: NEUROPLASTY / TRANSPOSITION ULNAR NERVE AT ELBOW: SUR895

## 2011-10-05 ENCOUNTER — Ambulatory Visit: Payer: Self-pay | Admitting: Obstetrics and Gynecology

## 2011-10-05 ENCOUNTER — Encounter: Payer: Self-pay | Admitting: Obstetrics and Gynecology

## 2011-10-05 ENCOUNTER — Ambulatory Visit (INDEPENDENT_AMBULATORY_CARE_PROVIDER_SITE_OTHER): Payer: Medicare Other | Admitting: Obstetrics and Gynecology

## 2011-10-05 VITALS — BP 138/62 | Temp 98.7°F | Ht 59.0 in | Wt 155.0 lb

## 2011-10-05 DIAGNOSIS — M899 Disorder of bone, unspecified: Secondary | ICD-10-CM

## 2011-10-05 DIAGNOSIS — Z01419 Encounter for gynecological examination (general) (routine) without abnormal findings: Secondary | ICD-10-CM

## 2011-10-05 DIAGNOSIS — Z124 Encounter for screening for malignant neoplasm of cervix: Secondary | ICD-10-CM

## 2011-10-05 DIAGNOSIS — E785 Hyperlipidemia, unspecified: Secondary | ICD-10-CM

## 2011-10-05 DIAGNOSIS — E119 Type 2 diabetes mellitus without complications: Secondary | ICD-10-CM

## 2011-10-05 DIAGNOSIS — I10 Essential (primary) hypertension: Secondary | ICD-10-CM

## 2011-10-05 DIAGNOSIS — M8580 Other specified disorders of bone density and structure, unspecified site: Secondary | ICD-10-CM | POA: Insufficient documentation

## 2011-10-05 DIAGNOSIS — G4733 Obstructive sleep apnea (adult) (pediatric): Secondary | ICD-10-CM

## 2011-10-05 MED ORDER — FLUCONAZOLE 150 MG PO TABS: 150.0000 mg | ORAL_TABLET | Freq: Once | ORAL | Status: AC

## 2011-10-05 MED ORDER — CLINDAMYCIN HCL 300 MG PO CAPS: 300.0000 mg | ORAL_CAPSULE | Freq: Two times a day (BID) | ORAL | Status: DC

## 2011-10-05 NOTE — Progress Notes (Signed)
Subjective:    Mariah Burns is a 66 y.o. female who presents for annual exam complaining of vaginal dryness with mild irritation.  Review of Systems Pertinent items are noted in HPI. Gastrointestinal:No change in bowel habits, no abdominal pain, no rectal bleeding Genitourinary:negative for dysuria, frequency, hematuria, nocturia and urinary incontinence    Objective:  BP 138/62  Temp. 98.7 F    Weight 155    Height 4\' 11"  BMI: 31.3 General Appearance: Alert, appropriate appearance for age. No acute distress HEENT: Grossly normal Neck / Thyroid: Supple, no masses, nodes or enlargement Lungs: clear to auscultation bilaterally Back: No CVA tenderness Breast Exam: No masses or nodes.No dimpling, nipple retraction or discharge. Cardiovascular: Regular rate and rhythm. S1, S2, no murmur Gastrointestinal: Soft, non-tender, no masses or organomegaly Pelvic Exam: External genitalia: atrophic Vaginal: atrophic mucosa Cervix: removed surgically uterus removed surgically adnexae-no masses or tendeness Rectovaginal: normal rectal, no masses Lymphatic Exam: Non-palpable nodes in neck, clavicular, axillary, or inguinal regions Skin: no rash or abnormalities Neurologic: Normal gait and speech, no tremor  Psychiatric: Alert and oriented, appropriate affect.  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

## 2011-10-05 NOTE — Progress Notes (Signed)
Regular Periods: no Mammogram: yes  Monthly Breast Ex.: no Exercise: yes  Tetanus < 10 years: yes Seatbelts: yes  NI. Bladder Functn.: yes Abuse at home: no  Daily BM's: yes Stressful Work: no  Healthy Diet: yes Sigmoid-Colonoscopy: 2 years ago nl  Calcium: no Medical problems this year: vaginal dryness   LAST PAP:2006 nl  Contraception: pm Mammogram:  2011 nl  PCP: DR. Dorothyann Peng  ZOX:WRUEAVW ON ELBOW FMH: NO CHANGE Last Bone Scan:WITHIN 10 YEARS

## 2011-10-05 NOTE — Patient Instructions (Signed)
Give patient information on the endometrial ablation and/or Novasure.

## 2011-10-09 ENCOUNTER — Encounter: Payer: Self-pay | Admitting: Internal Medicine

## 2011-10-09 ENCOUNTER — Ambulatory Visit (INDEPENDENT_AMBULATORY_CARE_PROVIDER_SITE_OTHER): Payer: Medicare Other | Admitting: Internal Medicine

## 2011-10-09 ENCOUNTER — Ambulatory Visit: Payer: Self-pay | Admitting: Obstetrics and Gynecology

## 2011-10-09 VITALS — BP 144/72 | HR 69 | Ht 59.0 in | Wt 154.6 lb

## 2011-10-09 DIAGNOSIS — G4733 Obstructive sleep apnea (adult) (pediatric): Secondary | ICD-10-CM

## 2011-10-09 DIAGNOSIS — D869 Sarcoidosis, unspecified: Secondary | ICD-10-CM

## 2011-10-09 NOTE — Progress Notes (Signed)
Subjective:    Patient ID: Mariah Burns, female    DOB: 06/17/1945, 66 y.o.   MRN: [id number]  HPI 10/10/10- 66 yoF followed with hx OSA and Sarcoid, complicated by HTN and DM.  Last here June 13, 2010- We had reduced CPAP to 9 and compliance check looked good on that.  Mentions some vertigo on first getting up in AM. Asks about trying a little lower pressure.  Sometimes a nonproductive morning cough - discussed indication for cxr.   04/10/11- 66 yoF followed with hx OSA and Sarcoid, complicated by HTN and DM.  We discussed flu vaccine. She is using CPAP but says some days it makes her cough or burp or feel bloated. She asks help with sleep maintenance. Sleep onset is good but she wakes after a couple of hours. The problem may be partly uncomfortable CPAP. Her mouth gets dry despite using Biotene.  10/09/11-66 yoF followed with hx OSA and Sarcoid, complicated by HTN and DM.  Using Oxycodone for shoulder pain and helps sleep;still using CPAP; breathing is doing well Comfortable with CPAP 7/SMS, but understands DME company has changed. We will need to help put her in touch with her new company which is probably [employer]. Does get some bloating and some dry mouth from CPAP suggesting air swallowing. We may be able to reduce the pressure a little if necessary.  Review of Systems-see HPI Constitutional:   No-   weight loss, night sweats, fevers, chills, fatigue, lassitude. HEENT:   No-  headaches, difficulty swallowing, tooth/dental problems, sore throat,       No-  sneezing, itching, ear ache, nasal congestion, post nasal drip,  CV:  No-   chest pain, orthopnea, PND, swelling in lower extremities, anasarca, dizziness, palpitations Resp: No-   shortness of breath with exertion or at rest.              No-   productive cough,  No non-productive cough,  No- coughing up of blood.              No-   change in color of mucus.  No- wheezing.   Skin: No-   rash or lesions. GI:  No-   heartburn,  indigestion, abdominal pain, nausea, vomiting,  GU:  MS:  No-   joint pain or swelling.   Neuro-     nothing unusual Psych:  No- change in mood or affect. No depression or anxiety.  No memory loss.      Objective:   Physical Exam General- Alert, Oriented, Affect-appropriate, Distress- none acute; medium build Skin- rash-none, lesions- none, excoriation- none Lymphadenopathy- none Head- atraumatic            Eyes- Gross vision intact, PERRLA, conjunctivae clear secretions            Ears- Hearing, canals-normal            Nose- Clear, no-Septal dev, mucus, polyps, erosion, perforation             Throat- Mallampati II , mucosa clear , drainage- none, tonsils- atrophic Neck- flexible , trachea midline, no stridor , thyroid nl, carotid no bruit Chest - symmetrical excursion , unlabored           Heart/CV- RRR , no murmur , no gallop  , no rub, nl s1 s2                           - JVD- none ,  edema- none, stasis changes- none, varices- none           Lung- clear to P&A, wheeze- none, cough- none , dullness-none, rub- none           Chest wall-  Abd- t Br/ Gen/ Rectal- Not done, not indicated Extrem- cyanosis- none, clubbing, none, atrophy- none, strength- nl Neuro- grossly intact to observation

## 2011-10-09 NOTE — Patient Instructions (Signed)
Order- [facility] - DME Was [facility] so now ?[employer]  Change CPAP to autotitration 5-10 cwp      Dx OSA

## 2011-10-14 NOTE — Assessment & Plan Note (Signed)
This is clinically in remission

## 2011-10-14 NOTE — Assessment & Plan Note (Addendum)
Current DME is [facility] pressure recorded as 9. Plan-auto titrate for pressure recommendation.

## 2011-12-03 ENCOUNTER — Other Ambulatory Visit: Payer: Self-pay | Admitting: Orthopaedic Surgery

## 2011-12-03 DIAGNOSIS — M25531 Pain in right wrist: Secondary | ICD-10-CM

## 2011-12-18 ENCOUNTER — Ambulatory Visit
Admission: RE | Admit: 2011-12-18 | Discharge: 2011-12-18 | Disposition: A | Discharge status: Home / Self Care | Payer: Medicare Other | Source: Ambulatory Visit | Attending: Orthopaedic Surgery | Admitting: Orthopaedic Surgery

## 2011-12-18 DIAGNOSIS — M25531 Pain in right wrist: Secondary | ICD-10-CM

## 2011-12-18 MED ORDER — IOHEXOL 180 MG/ML  SOLN
2.5000 mL | Freq: Once | INTRAMUSCULAR | Status: AC | PRN
  Administered 2011-12-18: 2.5 mL via INTRA_ARTICULAR

## 2012-01-16 ENCOUNTER — Other Ambulatory Visit: Payer: Self-pay | Admitting: Gastroenterology

## 2012-01-16 DIAGNOSIS — R1310 Dysphagia, unspecified: Secondary | ICD-10-CM

## 2012-01-21 ENCOUNTER — Ambulatory Visit
Admission: RE | Admit: 2012-01-21 | Discharge: 2012-01-21 | Disposition: A | Discharge status: Home / Self Care | Payer: Medicare Other | Source: Ambulatory Visit | Attending: Gastroenterology | Admitting: Gastroenterology

## 2012-01-21 DIAGNOSIS — R1310 Dysphagia, unspecified: Secondary | ICD-10-CM

## 2012-02-14 ENCOUNTER — Other Ambulatory Visit: Payer: Self-pay | Admitting: Internal Medicine

## 2012-02-14 DIAGNOSIS — E041 Nontoxic single thyroid nodule: Secondary | ICD-10-CM

## 2012-02-21 ENCOUNTER — Ambulatory Visit
Admission: RE | Admit: 2012-02-21 | Discharge: 2012-02-21 | Disposition: A | Discharge status: Home / Self Care | Payer: Medicare Other | Source: Ambulatory Visit | Attending: Internal Medicine | Admitting: Internal Medicine

## 2012-02-21 DIAGNOSIS — E041 Nontoxic single thyroid nodule: Secondary | ICD-10-CM

## 2012-02-27 ENCOUNTER — Ambulatory Visit
Admission: RE | Admit: 2012-02-27 | Discharge: 2012-02-27 | Disposition: A | Discharge status: Home / Self Care | Payer: Medicare Other | Source: Ambulatory Visit | Attending: Internal Medicine | Admitting: Internal Medicine

## 2012-02-27 ENCOUNTER — Other Ambulatory Visit: Payer: Self-pay | Admitting: Internal Medicine

## 2012-02-27 ENCOUNTER — Other Ambulatory Visit (HOSPITAL_COMMUNITY)
Admission: RE | Admit: 2012-02-27 | Discharge: 2012-02-27 | Disposition: A | Discharge status: Home / Self Care | Payer: Medicare Other | Source: Ambulatory Visit | Attending: Interventional Radiology | Admitting: Interventional Radiology

## 2012-02-27 DIAGNOSIS — E041 Nontoxic single thyroid nodule: Secondary | ICD-10-CM

## 2012-02-27 DIAGNOSIS — E042 Nontoxic multinodular goiter: Secondary | ICD-10-CM

## 2012-02-27 DIAGNOSIS — E049 Nontoxic goiter, unspecified: Secondary | ICD-10-CM | POA: Insufficient documentation

## 2012-04-15 ENCOUNTER — Encounter: Payer: Self-pay | Admitting: Internal Medicine

## 2012-04-15 ENCOUNTER — Ambulatory Visit (INDEPENDENT_AMBULATORY_CARE_PROVIDER_SITE_OTHER): Payer: Medicare Other | Admitting: Internal Medicine

## 2012-04-15 VITALS — BP 126/76 | HR 62 | Ht 59.0 in | Wt 152.0 lb

## 2012-04-15 DIAGNOSIS — D869 Sarcoidosis, unspecified: Secondary | ICD-10-CM

## 2012-04-15 DIAGNOSIS — Z23 Encounter for immunization: Secondary | ICD-10-CM

## 2012-04-15 DIAGNOSIS — G4733 Obstructive sleep apnea (adult) (pediatric): Secondary | ICD-10-CM

## 2012-04-15 NOTE — Patient Instructions (Addendum)
Order-DME/[facility]  Replacement CPAP mask of choice and supplies                                             Narrow CPAP autopap range 8-15 cwp                                              Ask about disconnecting hose from mask if you need to get up, rather than un-strapping    DX OSA       Flu vax

## 2012-04-15 NOTE — Progress Notes (Signed)
Subjective:    Patient ID: Mariah Burns, female    DOB: 1946-01-27, 66 y.o.   MRN: [id number]  HPI 10/10/10- 44 yoF followed with hx OSA and Sarcoid, complicated by HTN and DM.  Last here June 13, 2010- We had reduced CPAP to 9 and compliance check looked good on that.  Mentions some vertigo on first getting up in AM. Asks about trying a little lower pressure.  Sometimes a nonproductive morning cough - discussed indication for cxr.   04/10/11- 64 yoF followed with hx OSA and Sarcoid, complicated by HTN and DM.  We discussed flu vaccine. She is using CPAP but says some days it makes her cough or burp or feel bloated. She asks help with sleep maintenance. Sleep onset is good but she wakes after a couple of hours. The problem may be partly uncomfortable CPAP. Her mouth gets dry despite using Biotene.  10/09/11-64 yoF followed with hx OSA and Sarcoid, complicated by HTN and DM.  Using Oxycodone for shoulder pain and helps sleep;still using CPAP; breathing is doing well Comfortable with CPAP 7/SMS, but understands DME company has changed. We will need to help put her in touch with her new company which is probably [employer]. Does get some bloating and some dry mouth from CPAP suggesting air swallowing. We may be able to reduce the pressure a little if necessary.  04/15/12- 64 yoF followed with hx OSA and Sarcoid, complicated by HTN and DM.  Still using CPAP autopap/ [facility] every night for about 4-5 hours. Breathing is doing well but does have some SOB at times Mask leak dries her eyes so she takes the mask off when she gets up to the bathroom and leaves it off. 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  Cardiomegaly.  Question right hilar adenopathy versus prominent vasculature.  Original Report Authenticated By: Cyndie Chime, M.D.   Review of  Systems-see HPI Constitutional:   No-   weight loss, night sweats, fevers, chills, +fatigue, lassitude. HEENT:   No-  headaches, difficulty swallowing, tooth/dental problems, sore throat,       No-  sneezing, itching, ear ache, nasal congestion, post nasal drip,  CV:  No-   chest pain, orthopnea, PND, swelling in lower extremities, anasarca, dizziness, palpitations Resp: No-   shortness of breath with exertion or at rest.              No-   productive cough,  No non-productive cough,  No- coughing up of blood.              No-   change in color of mucus.  No- wheezing.   Skin: No-   rash or lesions. GI:  No-   heartburn, indigestion, abdominal pain, nausea, vomiting,  GU:  MS:  No-   joint pain or swelling.   Neuro-     nothing unusual Psych:  No- change in mood or affect. No depression or anxiety.  No memory loss.      Objective:   Physical Exam General- Alert, Oriented, Affect-appropriate, Distress- none acute; medium build Skin- rash-none, lesions- none, excoriation- none Lymphadenopathy- none Head- atraumatic            Eyes- Gross vision intact, PERRLA, conjunctivae clear secretions            Ears- Hearing, canals-normal  Nose- Clear, no-Septal dev, mucus, polyps, erosion, perforation             Throat- Mallampati II , mucosa clear , drainage- none, tonsils- atrophic Neck- flexible , trachea midline, no stridor , thyroid nl, carotid no bruit Chest - symmetrical excursion , unlabored           Heart/CV- RRR , no murmur , no gallop  , no rub, nl s1 s2                           - JVD- none , edema- none, stasis changes- none, varices- none           Lung- clear to P&A, wheeze- none, cough- none , dullness-none, rub- none           Chest wall-  Abd-  Br/ Gen/ Rectal- Not done, not indicated Extrem- cyanosis- none, clubbing, none, atrophy- none, strength- nl Neuro- grossly intact to observation

## 2012-04-25 NOTE — Assessment & Plan Note (Signed)
Good control. Some problems with dryness and discomfort which we discussed. Plan-replacement mask of choice. Narrow AutoPap range between 8 and 15

## 2012-04-25 NOTE — Assessment & Plan Note (Signed)
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 Hopefully this is burned out.

## 2012-09-18 ENCOUNTER — Ambulatory Visit (INDEPENDENT_AMBULATORY_CARE_PROVIDER_SITE_OTHER)
Admission: RE | Admit: 2012-09-18 | Discharge: 2012-09-18 | Disposition: A | Discharge status: Home / Self Care | Payer: Medicare Other | Source: Ambulatory Visit | Attending: Internal Medicine | Admitting: Internal Medicine

## 2012-09-18 ENCOUNTER — Ambulatory Visit (INDEPENDENT_AMBULATORY_CARE_PROVIDER_SITE_OTHER): Payer: Medicare Other | Admitting: Internal Medicine

## 2012-09-18 ENCOUNTER — Encounter: Payer: Self-pay | Admitting: Internal Medicine

## 2012-09-18 VITALS — BP 138/68 | HR 76 | Ht 59.5 in | Wt 154.8 lb

## 2012-09-18 DIAGNOSIS — D869 Sarcoidosis, unspecified: Secondary | ICD-10-CM

## 2012-09-18 DIAGNOSIS — G4733 Obstructive sleep apnea (adult) (pediatric): Secondary | ICD-10-CM

## 2012-09-18 NOTE — Patient Instructions (Addendum)
Order- CXR  Dx hx Sarcoid  Order- DME [facility] order- CPAP mask of choice and supplies    Dx OSA

## 2012-09-18 NOTE — Progress Notes (Signed)
Subjective:    Patient ID: Mariah Burns, female    DOB: January 05, 1946, 67 y.o.   MRN: [id number]  HPI 10/10/10- 59 yoF followed with hx OSA and Sarcoid, complicated by HTN and DM.  Last here June 13, 2010- We had reduced CPAP to 9 and compliance check looked good on that.  Mentions some vertigo on first getting up in AM. Asks about trying a little lower pressure.  Sometimes a nonproductive morning cough - discussed indication for cxr.   04/10/11- 64 yoF followed with hx OSA and Sarcoid, complicated by HTN and DM.  We discussed flu vaccine. She is using CPAP but says some days it makes her cough or burp or feel bloated. She asks help with sleep maintenance. Sleep onset is good but she wakes after a couple of hours. The problem may be partly uncomfortable CPAP. Her mouth gets dry despite using Biotene.  10/09/11-64 yoF followed with hx OSA and Sarcoid, complicated by HTN and DM.  Using Oxycodone for shoulder pain and helps sleep;still using CPAP; breathing is doing well Comfortable with CPAP 7/SMS, but understands DME company has changed. We will need to help put her in touch with her new company which is probably [employer]. Does get some bloating and some dry mouth from CPAP suggesting air swallowing. We may be able to reduce the pressure a little if necessary.  04/15/12- 64 yoF followed with hx OSA and Sarcoid, complicated by HTN and DM.  Still using CPAP autopap/ [facility] every night for about 4-5 hours. Breathing is doing well but does have some SOB at times Mask leak dries her eyes so she takes the mask off when she gets up to the bathroom and leaves it off. 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  Cardiomegaly.  Question right hilar adenopathy versus prominent vasculature.  Original Report Authenticated By: Cyndie Chime, M.D.    09/18/12- 39 yoF  followed with hx OSA and Sarcoid, complicated by HTN and DM.  FOLLOWS FOR: pt wearing CPAP everynight, on avg 2-5hrs per night- patient states she needs new mask ordered (worn), c/o dry mouth in morning from CPAP -- denies any new concerns CPAP 9/[facility]. All night every night with good control. No routine cough or chest tightness. Exercise class II days a week. She may get a little tired with that and with swimming 3 days per week.  Review of Systems-see HPI Constitutional:   No-   weight loss, night sweats, fevers, chills, +fatigue, lassitude. HEENT:   No-  headaches, difficulty swallowing, tooth/dental problems, sore throat,       No-  sneezing, itching, ear ache, nasal congestion, post nasal drip,  CV:  No-   chest pain, orthopnea, PND, swelling in lower extremities, anasarca, dizziness, palpitations Resp: No-   shortness of breath with exertion or at rest.              No-   productive cough,  No non-productive cough,  No- coughing up of blood.              No-   change in color of mucus.  No- wheezing.   Skin: No-   rash or lesions. GI:  No-   heartburn, indigestion, abdominal pain, nausea, vomiting,  GU:  MS:  No-   joint pain or swelling.   Neuro-     nothing unusual  Psych:  No- change in mood or affect. No depression or anxiety.  No memory loss.  Objective:   Physical Exam General- Alert, Oriented, Affect-appropriate, Distress- none acute; medium build Skin- rash-none, lesions- none, excoriation- none Lymphadenopathy- none Head- atraumatic            Eyes- Gross vision intact, PERRLA, conjunctivae clear secretions            Ears- Hearing, canals-normal            Nose- Clear, no-Septal dev, mucus, polyps, erosion, perforation             Throat- Mallampati II , mucosa +dry , drainage- none, tonsils- atrophic Neck- flexible , trachea midline, no stridor , thyroid nl, carotid no bruit Chest - symmetrical excursion , unlabored           Heart/CV- RRR , no murmur , no gallop   , no rub, nl s1 s2                           - JVD- none , edema- none, stasis changes- none, varices- none           Lung- clear to P&A, wheeze- none, cough- none , dullness-none, rub- none           Chest wall-  Abd-  Br/ Gen/ Rectal- Not done, not indicated Extrem- cyanosis- none, clubbing, none, atrophy- none, strength- nl Neuro- grossly intact to observation

## 2012-09-25 ENCOUNTER — Telehealth: Payer: Self-pay | Admitting: Internal Medicine

## 2012-09-25 NOTE — Telephone Encounter (Signed)
Per EPIC this order was faxed over on 09-18-12 to [facility]. [facility] can you please check into what the issue is with the order for cpap mask and supplies. Millie also states she needs last OV note as well.Carron Curie, CMA

## 2012-09-25 NOTE — Assessment & Plan Note (Signed)
Clinically in remission. We reviewed symptoms that could suggest reactivation

## 2012-09-25 NOTE — Telephone Encounter (Signed)
Order and ov faxed to [facility]

## 2012-09-25 NOTE — Assessment & Plan Note (Signed)
Good compliance and control as she continues CPAP now at 9

## 2012-09-26 NOTE — Progress Notes (Signed)
Quick Note:  Pt aware of results. ______ 

## 2013-07-25 ENCOUNTER — Encounter (HOSPITAL_COMMUNITY): Payer: Self-pay | Admitting: Emergency Medicine

## 2013-07-25 ENCOUNTER — Emergency Department (HOSPITAL_COMMUNITY)
Admission: EM | Admit: 2013-07-25 | Discharge: 2013-07-25 | Disposition: A | Discharge status: Home / Self Care | Payer: Medicare Other | Attending: Emergency Medicine | Admitting: Emergency Medicine

## 2013-07-25 ENCOUNTER — Emergency Department (HOSPITAL_COMMUNITY): Payer: Medicare Other

## 2013-07-25 DIAGNOSIS — Z7982 Long term (current) use of aspirin: Secondary | ICD-10-CM | POA: Insufficient documentation

## 2013-07-25 DIAGNOSIS — I10 Essential (primary) hypertension: Secondary | ICD-10-CM | POA: Insufficient documentation

## 2013-07-25 DIAGNOSIS — E119 Type 2 diabetes mellitus without complications: Secondary | ICD-10-CM | POA: Insufficient documentation

## 2013-07-25 DIAGNOSIS — Y939 Activity, unspecified: Secondary | ICD-10-CM | POA: Insufficient documentation

## 2013-07-25 DIAGNOSIS — Z8669 Personal history of other diseases of the nervous system and sense organs: Secondary | ICD-10-CM | POA: Insufficient documentation

## 2013-07-25 DIAGNOSIS — W010XXA Fall on same level from slipping, tripping and stumbling without subsequent striking against object, initial encounter: Secondary | ICD-10-CM | POA: Insufficient documentation

## 2013-07-25 DIAGNOSIS — Z79899 Other long term (current) drug therapy: Secondary | ICD-10-CM | POA: Insufficient documentation

## 2013-07-25 DIAGNOSIS — W19XXXA Unspecified fall, initial encounter: Secondary | ICD-10-CM

## 2013-07-25 DIAGNOSIS — S060X9A Concussion with loss of consciousness of unspecified duration, initial encounter: Secondary | ICD-10-CM | POA: Insufficient documentation

## 2013-07-25 DIAGNOSIS — E785 Hyperlipidemia, unspecified: Secondary | ICD-10-CM | POA: Insufficient documentation

## 2013-07-25 DIAGNOSIS — S1093XA Contusion of unspecified part of neck, initial encounter: Secondary | ICD-10-CM

## 2013-07-25 DIAGNOSIS — S0093XA Contusion of unspecified part of head, initial encounter: Secondary | ICD-10-CM

## 2013-07-25 DIAGNOSIS — Z8739 Personal history of other diseases of the musculoskeletal system and connective tissue: Secondary | ICD-10-CM | POA: Insufficient documentation

## 2013-07-25 DIAGNOSIS — W1809XA Striking against other object with subsequent fall, initial encounter: Secondary | ICD-10-CM | POA: Insufficient documentation

## 2013-07-25 DIAGNOSIS — Y92009 Unspecified place in unspecified non-institutional (private) residence as the place of occurrence of the external cause: Secondary | ICD-10-CM | POA: Insufficient documentation

## 2013-07-25 DIAGNOSIS — Z8619 Personal history of other infectious and parasitic diseases: Secondary | ICD-10-CM | POA: Insufficient documentation

## 2013-07-25 DIAGNOSIS — S0003XA Contusion of scalp, initial encounter: Secondary | ICD-10-CM | POA: Insufficient documentation

## 2013-07-25 DIAGNOSIS — S0083XA Contusion of other part of head, initial encounter: Secondary | ICD-10-CM

## 2013-07-25 DIAGNOSIS — S060XAA Concussion with loss of consciousness status unknown, initial encounter: Secondary | ICD-10-CM

## 2013-07-25 NOTE — Discharge Instructions (Signed)

## 2013-07-25 NOTE — ED Notes (Signed)
Bed: WA10 Expected date:  Expected time:  Means of arrival:  Comments: Hold for triage  

## 2013-07-25 NOTE — ED Provider Notes (Signed)
CSN: [id number]     Arrival date & time 07/25/13  1646 History   First MD Initiated Contact with Patient 07/25/13 1722     Chief Complaint  Patient presents with  . Fall  . Head Injury     (Consider location/radiation/quality/duration/timing/severity/associated sxs/prior Treatment) HPI 68 year old female who fell at home this morning and struck the back of her head. She denies any loss of consciousness, vomiting, visual change, or lateralized weakness. She does have some bilateral frontal headache. She was seen at an urgent care advice come to the emergency department for further evaluation and a head CT. Past Medical History  Diagnosis Date  . 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 OSA (obstructive sleep apnea)     NPSG 03/08/2010  AHI 19.8/hr   . Hypertension   . Hyperlipidemia   . Type 2 diabetes mellitus   . Osteopenia    Past Surgical History  Procedure Laterality Date  . Total abdominal hysterectomy    . Bronchoscopy      dx'd sarcoid  . Neuroplasty / transposition ulnar nerve at elbow  09/2011   Family History  Problem Relation Age of Onset  . Allergies Sister   . Heart disease Sister   . Allergies Mother   . Allergies Daughter   . Asthma Daughter   . Heart disease Sister   . Gout Brother    History  Substance Use Topics  . Smoking status: Never Smoker   . Smokeless tobacco: Never Used  . Alcohol Use: No   OB History   Grav Para Term Preterm Abortions TAB SAB Ect Mult Living   3 3        3      Review of Systems  All other systems reviewed and are negative.      Allergies  Review of patient's allergies indicates no known allergies.  Home Medications   Current Outpatient Rx  Name  Route  Sig  Dispense  Refill  . Cholecalciferol (VITAMIN D3) 2000 UNITS TABS   Oral   Take 1 tablet by mouth daily.           . felodipine (PLENDIL) 10 MG 24 hr  tablet   Oral   Take 10 mg by mouth daily.           . fluvastatin XL (LESCOL XL) 80 MG 24 hr tablet   Oral   Take 80 mg by mouth daily.           Marland Kitchen aspirin 81 MG tablet   Oral   Take 81 mg by mouth. Every once in a while.          . carvedilol (COREG) 3.125 MG tablet   Oral   Take 1 tablet by mouth Twice daily.         . fish oil-omega-3 fatty acids 1000 MG capsule   Oral   Take 2 capsules by mouth daily.           Marland Kitchen GLUCOSAMINE-CHONDROITIN PO   Oral   Take 2 tablets by mouth daily.           . Misc Natural Products (TART CHERRY ADVANCED PO)   Oral   Take 2 tablets by mouth daily.         Marland Kitchen NITROSTAT 0.4 MG SL tablet   Sublingual   Place 1 tablet under the tongue  as needed.         Marland Kitchen omeprazole (PRILOSEC) 40 MG capsule   Oral   Take 1 tablet by mouth Daily.         Marland Kitchen oxyCODONE-acetaminophen (PERCOCET) 5-325 MG per tablet   Oral   Take 1 tablet by mouth At bedtime.          BP 148/58  Pulse 60  Temp(Src) 98.4 F (36.9 C) (Oral)  Resp 18  SpO2 98% Physical Exam  Nursing note and vitals reviewed. Constitutional: She is oriented to person, place, and time. She appears well-developed and well-nourished.  HENT:  Head: Normocephalic and atraumatic.  Right Ear: Tympanic membrane and external ear normal.  Left Ear: Tympanic membrane and external ear normal.  Nose: Nose normal. Right sinus exhibits no maxillary sinus tenderness and no frontal sinus tenderness. Left sinus exhibits no maxillary sinus tenderness and no frontal sinus tenderness.  Mouth/Throat: Oropharynx is clear and moist.  Eyes: Conjunctivae and EOM are normal. Pupils are equal, round, and reactive to light. Right eye exhibits no nystagmus. Left eye exhibits no nystagmus.  Neck: Normal range of motion. Neck supple.  Cardiovascular: Normal rate, regular rhythm, normal heart sounds and intact distal pulses.   Pulmonary/Chest: Effort normal and breath sounds normal. No respiratory  distress. She exhibits no tenderness.  Abdominal: Soft. Bowel sounds are normal. She exhibits no distension and no mass. There is no tenderness.  Musculoskeletal: Normal range of motion. She exhibits no edema and no tenderness.  Neurological: She is alert and oriented to person, place, and time. She has normal strength and normal reflexes. No sensory deficit. She displays a negative Romberg sign. GCS eye subscore is 4. GCS verbal subscore is 5. GCS motor subscore is 6.  Reflex Scores:      Tricep reflexes are 2+ on the right side and 2+ on the left side.      Bicep reflexes are 2+ on the right side and 2+ on the left side.      Brachioradialis reflexes are 2+ on the right side and 2+ on the left side.      Patellar reflexes are 2+ on the right side and 2+ on the left side.      Achilles reflexes are 2+ on the right side and 2+ on the left side. Patient with normal gait without ataxia, shuffling, spasm, or antalgia. Speech is normal without dysarthria, dysphasia, or aphasia. Muscle strength is 5/5 in bilateral shoulders, elbow flexor and extensors, wrist flexor and extensors, and intrinsic hand muscles. 5/5 bilateral lower extremity hip flexors, extensors, knee flexors and extensors, and ankle dorsi and plantar flexors.    Skin: Skin is warm and dry. No rash noted.  Psychiatric: She has a normal mood and affect. Her behavior is normal. Judgment and thought content normal.    ED Course  Procedures (including critical care time) Labs Review Labs Reviewed - No data to display Imaging Review Ct Head Wo Contrast  07/25/2013   CLINICAL DATA:  [place], striking the back of the head.  EXAM: CT HEAD WITHOUT CONTRAST  TECHNIQUE: Contiguous axial images were obtained from the base of the skull through the vertex without intravenous contrast.  COMPARISON:  05/27/2007.  FINDINGS: Ventricular system normal in size and appearance for age. No mass lesion. No midline shift. No acute hemorrhage or hematoma. No  extra-axial fluid collections. No evidence of acute infarction. No focal brain parenchymal abnormalities. No significant interval change.  No skull fractures or other focal osseous  abnormalities involving the skull. Visualized paranasal sinuses, bilateral mastoid air cells, and bilateral middle ear cavities well-aerated.  IMPRESSION: Normal and stable examination.   Electronically Signed   By: Evangeline Dakin M.D.   On: 07/25/2013 18:59    EKG Interpretation   None       MDM   Final diagnoses:  Fall  Head contusion  Concussion       Shaune Pollack, MD 07/26/13 [phone]

## 2013-07-25 NOTE — ED Notes (Signed)
Pt presents with c/o fall that occurred around 12:30 pm today. Pt slipped on the ice and hit her head. Pt says she does not remember any LOC but felt dazed after the fall. Pt was seen at Urgent Care and they referred her here to rule out any bleeding. Pt is alert and oriented x 4, experiencing some dizziness and sensitivity to light.

## 2013-09-01 ENCOUNTER — Encounter: Payer: Self-pay | Admitting: Diagnostic Neuroimaging

## 2013-09-01 ENCOUNTER — Ambulatory Visit (INDEPENDENT_AMBULATORY_CARE_PROVIDER_SITE_OTHER): Payer: Medicare Other | Admitting: Diagnostic Neuroimaging

## 2013-09-01 ENCOUNTER — Encounter (INDEPENDENT_AMBULATORY_CARE_PROVIDER_SITE_OTHER): Payer: Self-pay

## 2013-09-01 VITALS — BP 153/72 | HR 59 | Temp 98.0°F | Ht 59.0 in | Wt 153.0 lb

## 2013-09-01 DIAGNOSIS — F0781 Postconcussional syndrome: Secondary | ICD-10-CM

## 2013-09-01 NOTE — Patient Instructions (Signed)
POST CONCUSSION SYNDROME - Gradually increase physical activity as tolerated.   Insomnia Insomnia is frequent trouble falling and/or staying asleep. Insomnia can be a long term problem or a short term problem. Both are common. Insomnia can be a short term problem when the wakefulness is related to a certain stress or worry. Long term insomnia is often related to ongoing stress during waking hours and/or poor sleeping habits. Overtime, sleep deprivation itself can make the problem worse. Every little thing feels more severe because you are overtired and your ability to cope is decreased.  TREATMENT   Try melatonin supplement at bedtime  You can promote easier sleeping by making lifestyle changes such as:  Using relaxation techniques that help with breathing and reduce muscle tension.  Exercising earlier in the day.  Changing your diet and the time of your last meal. No night time snacks.  Establish a regular time to go to bed.  Counseling can help with stressful problems and worry.  Soothing music and white noise may be helpful if there are background noises you cannot remove.  Stop tedious detailed work at least one hour before bedtime. HOME CARE INSTRUCTIONS   Keep a diary. Inform your caregiver about your progress. This includes any medication side effects. See your caregiver regularly. Take note of:  Times when you are asleep.  Times when you are awake during the night.  The quality of your sleep.  How you feel the next day. This information will help your caregiver care for you.  Get out of bed if you are still awake after 15 minutes. Read or do some quiet activity. Keep the lights down. Wait until you feel sleepy and go back to bed.  Keep regular sleeping and waking hours. Avoid naps.  Exercise regularly.  Avoid distractions at bedtime. Distractions include watching television or engaging in any intense or detailed activity like attempting to balance the household  checkbook.  Develop a bedtime ritual. Keep a familiar routine of bathing, brushing your teeth, climbing into bed at the same time each night, listening to soothing music. Routines increase the success of falling to sleep faster.  Use relaxation techniques. This can be using breathing and muscle tension release routines. It can also include visualizing peaceful scenes. You can also help control troubling or intruding thoughts by keeping your mind occupied with boring or repetitive thoughts like the old concept of counting sheep. You can make it more creative like imagining planting one beautiful flower after another in your backyard garden.  During your day, work to eliminate stress. When this is not possible use some of the previous suggestions to help reduce the anxiety that accompanies stressful situations.

## 2013-09-01 NOTE — Progress Notes (Signed)
[facility]  PATIENT: Mariah Burns DOB: [birth year]  REFERRING CLINICIAN: Domenick Bookbinder HISTORY FROM: patient  REASON FOR VISIT: new consult   HISTORICAL  CHIEF COMPLAINT:  Chief Complaint  Patient presents with  . Fall    HISTORY OF PRESENT ILLNESS:   68 year old female here for evaluation of post traumatic headaches since 07/25/13.  Patient was walking, slipped on ice, fell and struck her head. She had significant stunned and dazed feeling. She had pain on the right side of her head and left occipital region. Patient was able to stand up and go inside. When patient's daughter came home they went to urgent care and then referred emergency room. Patient was diagnosed with concussion. Neuroimaging studies are unremarkable.  Since that time patient has had intermittent headaches, blurred vision, photosensitivity, photophobia phonophobia, decreased attention, verbal and cognitive difficulties. She's also having more agitation lately.  REVIEW OF SYSTEMS: Full 14 system review of systems performed and notable only for snoring years but isn't double vision memory loss confusion headache slurred speech dizziness not a sleep.  ALLERGIES: No Known Allergies  HOME MEDICATIONS: Outpatient Prescriptions Prior to Visit  Medication Sig Dispense Refill  . aspirin 81 MG tablet Take 81 mg by mouth. Every once in a while.       . carvedilol (COREG) 3.125 MG tablet Take 1 tablet by mouth Twice daily.      . Cholecalciferol (VITAMIN D3) 2000 UNITS TABS Take 1 tablet by mouth daily.        . felodipine (PLENDIL) 10 MG 24 hr tablet Take 10 mg by mouth daily.        . fish oil-omega-3 fatty acids 1000 MG capsule Take 2 capsules by mouth daily.        . fluvastatin XL (LESCOL XL) 80 MG 24 hr tablet Take 80 mg by mouth daily.        Marland Kitchen GLUCOSAMINE-CHONDROITIN PO Take 2 tablets by mouth daily.        . Misc Natural Products (TART CHERRY ADVANCED PO) Take 2 tablets by mouth daily.       Marland Kitchen NITROSTAT 0.4 MG SL tablet Place 1 tablet under the tongue as needed.      Marland Kitchen omeprazole (PRILOSEC) 40 MG capsule Take 1 tablet by mouth Daily.      Marland Kitchen oxyCODONE-acetaminophen (PERCOCET) 5-325 MG per tablet Take 1 tablet by mouth At bedtime.       No facility-administered medications prior to visit.    PAST MEDICAL HISTORY: Past Medical History  Diagnosis Date  . 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 OSA (obstructive sleep apnea)     NPSG 03/08/2010  AHI 19.8/hr   . Hypertension   . Hyperlipidemia   . Type 2 diabetes mellitus   . Osteopenia     PAST SURGICAL HISTORY: Past Surgical History  Procedure Laterality Date  . Total abdominal hysterectomy    . Bronchoscopy      dx'd sarcoid  . Neuroplasty / transposition ulnar nerve at elbow  09/2011    FAMILY HISTORY: Family History  Problem Relation Age of Onset  . Allergies Sister   . Heart disease Sister   . Allergies Mother   . Allergies Daughter   . Asthma Daughter   . Heart disease Sister   . Gout Brother   . Heart failure Father  SOCIAL HISTORY:  History   Social History  . Marital Status: Divorced    Spouse Name: N/A    Number of Children: 3  . Years of Education: College   Occupational History  . Retired    Social History Main Topics  . Smoking status: Never Smoker   . Smokeless tobacco: Never Used  . Alcohol Use: No  . Drug Use: No  . Sexual Activity: Not on file   Other Topics Concern  . Not on file   Social History Narrative   Patient lives at home with daughter.   Caffeine Use: none     PHYSICAL EXAM  Filed Vitals:   09/01/13 0943  BP: 153/72  Pulse: 59  Temp: 98 F (36.7 C)  TempSrc: Oral  Height: 4\' 11"  (1.499 m)  Weight: 153 lb (69.4 kg)    Not recorded    Body mass index is 30.89 kg/(m^2).  GENERAL EXAM: Patient is in no distress; well developed, nourished and  groomed; neck is supple; SOFT SPOKEN  CARDIOVASCULAR: Regular rate and rhythm, no murmurs, no carotid bruits  NEUROLOGIC: MENTAL STATUS: awake, alert, oriented to person, place and time, recent and remote memory intact, normal attention and concentration, language fluent, comprehension intact, naming intact, fund of knowledge appropriate CRANIAL NERVE: no papilledema on fundoscopic exam, pupils equal and reactive to light, visual fields full to confrontation, extraocular muscles intact, no nystagmus, facial sensation and strength symmetric, hearing intact, palate elevates symmetrically, uvula midline, shoulder shrug symmetric, tongue midline. MOTOR: normal bulk and tone, full strength in the BUE, BLE SENSORY: normal and symmetric to light touch, pinprick, temperature, vibration COORDINATION: finger-nose-finger, fine finger movements normal REFLEXES: deep tendon reflexes present and symmetric GAIT/STATION: narrow based gait; able to walk tandem; romberg is negative    DIAGNOSTIC DATA (LABS, IMAGING, TESTING) - I reviewed patient records, labs, notes, testing and imaging myself where available.  Lab Results  Component Value Date   WBC 3.8* 12/04/2006   HGB 11.8* 12/04/2006   HCT 34.4* 12/04/2006   MCV 83.0 12/04/2006   PLT 227 12/04/2006      Component Value Date/Time   PROT 8.9* 11/06/2006 1557   ALBUMIN 3.7 11/06/2006 1557   AST 27 11/06/2006 1557   ALT 19 11/06/2006 1557   ALKPHOS 159* 11/06/2006 1557   BILITOT 0.7 11/06/2006 1557   No results found for this basename: CHOL, HDL, LDLCALC, LDLDIRECT, TRIG, CHOLHDL   No results found for this basename: HGBA1C   No results found for this basename: VITAMINB12   No results found for this basename: TSH    I reviewed images myself and agree with interpretation. -vrp  07/25/13 CT HEAD - Normal and stable examination.   ASSESSMENT AND PLAN  68 y.o. 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 Neuro  exam unremarkable. Also continue to struggle with insomnia (since 2005).  PLAN: - sleep hygiene strategies reviewed - gradually increase physical activity - ibuprofen prn headache - symptoms should gradually improve over next 3-6 months, or earlier  Return if symptoms worsen or fail to improve, for return to PCP.    Penni Bombard, MD 11/28/9483, 46:27 AM Certified in Neurology, Neurophysiology and Neuroimaging  [facility] [address] [place], [place] [address] [phone]

## 2013-09-18 ENCOUNTER — Ambulatory Visit (INDEPENDENT_AMBULATORY_CARE_PROVIDER_SITE_OTHER): Payer: Medicare Other | Admitting: Internal Medicine

## 2013-09-18 ENCOUNTER — Encounter: Payer: Self-pay | Admitting: Internal Medicine

## 2013-09-18 ENCOUNTER — Encounter (INDEPENDENT_AMBULATORY_CARE_PROVIDER_SITE_OTHER): Payer: Self-pay

## 2013-09-18 VITALS — BP 126/74 | HR 58 | Ht 59.5 in | Wt 156.6 lb

## 2013-09-18 DIAGNOSIS — G4733 Obstructive sleep apnea (adult) (pediatric): Secondary | ICD-10-CM

## 2013-09-18 DIAGNOSIS — D869 Sarcoidosis, unspecified: Secondary | ICD-10-CM

## 2013-09-18 NOTE — Patient Instructions (Signed)
Order- DME [facility]- reduce CPAP to 8 cwp      Dx OSA  Please call as needed

## 2013-09-18 NOTE — Progress Notes (Signed)
Subjective:    Patient ID: Mariah Burns, female    DOB: 1945-11-06, 68 y.o.   MRN: [id number]  HPI 10/10/10- 22 yoF followed with hx OSA and Sarcoid, complicated by HTN and DM.  Last here June 13, 2010- We had reduced CPAP to 9 and compliance check looked good on that.  Mentions some vertigo on first getting up in AM. Asks about trying a little lower pressure.  Sometimes a nonproductive morning cough - discussed indication for cxr.   04/10/11- 64 yoF followed with hx OSA and Sarcoid, complicated by HTN and DM.  We discussed flu vaccine. She is using CPAP but says some days it makes her cough or burp or feel bloated. She asks help with sleep maintenance. Sleep onset is good but she wakes after a couple of hours. The problem may be partly uncomfortable CPAP. Her mouth gets dry despite using Biotene.  10/09/11-64 yoF followed with hx OSA and Sarcoid, complicated by HTN and DM.  Using Oxycodone for shoulder pain and helps sleep;still using CPAP; breathing is doing well Comfortable with CPAP 7/SMS, but understands DME company has changed. We will need to help put her in touch with her new company which is probably [employer]. Does get some bloating and some dry mouth from CPAP suggesting air swallowing. We may be able to reduce the pressure a little if necessary.  04/15/12- 84 yoF followed with hx OSA and Sarcoid, complicated by HTN and DM.  Still using CPAP autopap/ [facility] every night for about 4-5 hours. Breathing is doing well but does have some SOB at times Mask leak dries her eyes so she takes the mask off when she gets up to the bathroom and leaves it off. 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  Cardiomegaly.  Question right hilar adenopathy versus prominent vasculature.  Original Report Authenticated By: Raelyn Number, M.D.    09/18/12- 28 yoF  followed with hx OSA and Sarcoid, complicated by HTN and DM.  FOLLOWS FOR: pt wearing CPAP everynight, on avg 2-5hrs per night- patient states she needs new mask ordered (worn), c/o dry mouth in morning from CPAP -- denies any new concerns CPAP 9/[facility]. All night every night with good control. No routine cough or chest tightness. Exercise class II days a week. She may get a little tired with that and with swimming 3 days per week.  09/18/13-67  yoF followed with hx OSA and Sarcoid, complicated by HTN and DM.  FOLLOWS FOR: wears CPAP 9/ [facility] every night for about 5 hours; DME is [facility] Occasionally CPAP over dries her airway. She dislikes Biotene. Likes the nasal pillows mask. Occasional dyspnea on exertion while walking, not consistent.  Review of Systems-see HPI Constitutional:   No-   weight loss, night sweats, fevers, chills, +fatigue, lassitude. HEENT:   No-  headaches, difficulty swallowing, tooth/dental problems, sore throat,       No-  sneezing, itching, ear ache, nasal congestion, post nasal drip,  CV:  No-   chest pain, orthopnea, PND, swelling in lower extremities, anasarca, dizziness, palpitations Resp: N+shortness of breath with exertion or at rest.              No-   productive cough,  No non-productive cough,  No- coughing up of blood.  No-   change in color of mucus.  No- wheezing.   Skin: No-   rash or lesions. GI:  No-   heartburn, indigestion, abdominal pain, nausea, vomiting,  GU:  MS:  No-   joint pain or swelling.   Neuro-     nothing unusual Psych:  No- change in mood or affect. No depression or anxiety.  No memory loss.  Objective:   Physical Exam General- Alert, Oriented, Affect-appropriate, Distress- none acute; medium build Skin- rash-none, lesions- none, excoriation- none Lymphadenopathy- none Head- atraumatic            Eyes- Gross vision intact, PERRLA, conjunctivae clear secretions            Ears- Hearing,  canals-normal            Nose- Clear, no-Septal dev, mucus, polyps, erosion, perforation             Throat- Mallampati III , mucosa- not very dry , drainage- none, tonsils- atrophic Neck- flexible , trachea midline, no stridor , thyroid nl, carotid no bruit Chest - symmetrical excursion , unlabored           Heart/CV- RRR , no murmur , no gallop  , no rub, nl s1 s2                           - JVD- none , edema- none, stasis changes- none, varices- none           Lung- clear to P&A, wheeze- none, cough- none , dullness-none, rub- none           Chest wall-  Abd-  Br/ Gen/ Rectal- Not done, not indicated Extrem- cyanosis- none, clubbing, none, atrophy- none, strength- nl Neuro- grossly intact to observation

## 2013-10-18 NOTE — Assessment & Plan Note (Signed)
Dyspnea on exertion sounds pretty vague in her description. I suggested she walk more regularly for stamina and see what she notices.

## 2013-10-18 NOTE — Assessment & Plan Note (Signed)
Good compliance and control at 9 but because of the dryness we're going to try reducing her flow to 8

## 2014-04-05 ENCOUNTER — Encounter: Payer: Self-pay | Admitting: Internal Medicine

## 2014-09-20 ENCOUNTER — Encounter: Payer: Self-pay | Admitting: Internal Medicine

## 2014-09-20 ENCOUNTER — Ambulatory Visit (INDEPENDENT_AMBULATORY_CARE_PROVIDER_SITE_OTHER): Payer: Medicare Other | Admitting: Internal Medicine

## 2014-09-20 VITALS — BP 120/68 | HR 67 | Ht 59.5 in | Wt 152.4 lb

## 2014-09-20 DIAGNOSIS — G4733 Obstructive sleep apnea (adult) (pediatric): Secondary | ICD-10-CM

## 2014-09-20 NOTE — Patient Instructions (Addendum)
Order- DME [facility] change fixed CPAP 11, Please advise patient when she might be able to get a new machine  Download on CPAP 11 for pressure compliance       Dx OSA

## 2014-09-20 NOTE — Progress Notes (Signed)
Subjective:    Patient ID: Mariah Burns, female    DOB: 07/03/1945, 69 y.o.   MRN: [id number]  HPI 10/10/10- 18 yoF followed with hx OSA and Sarcoid, complicated by HTN and DM.  Last here June 13, 2010- We had reduced CPAP to 9 and compliance check looked good on that.  Mentions some vertigo on first getting up in AM. Asks about trying a little lower pressure.  Sometimes a nonproductive morning cough - discussed indication for cxr.   04/10/11- 64 yoF followed with hx OSA and Sarcoid, complicated by HTN and DM.  We discussed flu vaccine. She is using CPAP but says some days it makes her cough or burp or feel bloated. She asks help with sleep maintenance. Sleep onset is good but she wakes after a couple of hours. The problem may be partly uncomfortable CPAP. Her mouth gets dry despite using Biotene.  10/09/11-64 yoF followed with hx OSA and Sarcoid, complicated by HTN and DM.  Using Oxycodone for shoulder pain and helps sleep;still using CPAP; breathing is doing well Comfortable with CPAP 7/SMS, but understands DME company has changed. We will need to help put her in touch with her new company which is probably [place]. Does get some bloating and some dry mouth from CPAP suggesting air swallowing. We may be able to reduce the pressure a little if necessary.  04/15/12- 50 yoF followed with hx OSA and Sarcoid, complicated by HTN and DM.  Still using CPAP autopap/ [facility] every night for about 4-5 hours. Breathing is doing well but does have some SOB at times Mask leak dries her eyes so she takes the mask off when she gets up to the bathroom and leaves it off. 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  Cardiomegaly.  Question right hilar adenopathy versus prominent vasculature.  Original Report Authenticated By: Raelyn Number, M.D.    09/18/12- 10 yoF  followed with hx OSA and Sarcoid, complicated by HTN and DM.  FOLLOWS FOR: pt wearing CPAP everynight, on avg 2-5hrs per night- patient states she needs new mask ordered (worn), c/o dry mouth in morning from CPAP -- denies any new concerns CPAP 9/[facility]. All night every night with good control. No routine cough or chest tightness. Exercise class II days a week. She may get a little tired with that and with swimming 3 days per week.  09/18/13-67  yoF followed with hx OSA and Sarcoid, complicated by HTN and DM.  FOLLOWS FOR: wears CPAP 9/ Choice [facility] every night for about 5 hours; DME is [facility] Occasionally CPAP over dries her airway. She dislikes Biotene. Likes the nasal pillows mask. Occasional dyspnea on exertion while walking, not consistent.  09/20/14- 66  yoF followed with hx OSA and Sarcoid, complicated by HTN and DM FOLLOWS FOR: DME is [facility]; Pt states she wears CPAP auto 8-15/ every night for about 3 hours.[facility]. Takes CPAP off when she wakes up during the night almost every night. Watches TV for a while then dozes back off to sleep. Download shows inadequate use but good control on auto titration 8-15  Review of Systems-see HPI Constitutional:   No-   weight loss, night sweats, fevers, chills, +fatigue, lassitude. HEENT:   No-  headaches, difficulty swallowing, tooth/dental problems, sore throat,       No-  sneezing, itching, ear ache,  nasal congestion, post nasal drip,  CV:  No-   chest pain, orthopnea, PND, swelling in lower extremities, anasarca, dizziness, palpitations Resp: N+shortness of breath with exertion or at rest.              No-   productive cough,  No non-productive cough,  No- coughing up of blood.              No-   change in color of mucus.  No- wheezing.   Skin: No-   rash or lesions. GI:  No-   heartburn, indigestion, abdominal pain, nausea, vomiting,  GU:  MS:  No-   joint pain or swelling.   Neuro-     nothing unusual Psych:  No-  change in mood or affect. No depression or anxiety.  No memory loss.  Objective:   Physical Exam General- Alert, Oriented, Affect-appropriate, Distress- none acute; medium build Skin- rash-none, lesions- none, excoriation- none Lymphadenopathy- none Head- atraumatic            Eyes- Gross vision intact, PERRLA, conjunctivae clear secretions            Ears- Hearing, canals-normal            Nose- Clear, no-Septal dev, mucus, polyps, erosion, perforation             Throat- Mallampati III , mucosa- not very dry , drainage- none, tonsils- atrophic Neck- flexible , trachea midline, no stridor , thyroid nl, carotid no bruit Chest - symmetrical excursion , unlabored           Heart/CV- RRR , no murmur , no gallop  , no rub, nl s1 s2                           - JVD- none , edema- none, stasis changes- none, varices- none           Lung- clear to P&A, wheeze- none, cough- none , dullness-none, rub- none           Chest wall-  Abd-  Br/ Gen/ Rectal- Not done, not indicated Extrem- cyanosis- none, clubbing, none, atrophy- none, strength- nl Neuro- grossly intact to observation

## 2014-11-27 ENCOUNTER — Encounter (HOSPITAL_COMMUNITY): Payer: Self-pay | Admitting: *Deleted

## 2014-11-27 ENCOUNTER — Emergency Department (HOSPITAL_COMMUNITY): Payer: Medicare Other

## 2014-11-27 ENCOUNTER — Emergency Department (HOSPITAL_COMMUNITY)
Admission: EM | Admit: 2014-11-27 | Discharge: 2014-11-27 | Disposition: A | Discharge status: Home / Self Care | Payer: Medicare Other | Attending: Emergency Medicine | Admitting: Emergency Medicine

## 2014-11-27 DIAGNOSIS — Z79899 Other long term (current) drug therapy: Secondary | ICD-10-CM | POA: Insufficient documentation

## 2014-11-27 DIAGNOSIS — Z862 Personal history of diseases of the blood and blood-forming organs and certain disorders involving the immune mechanism: Secondary | ICD-10-CM | POA: Insufficient documentation

## 2014-11-27 DIAGNOSIS — Z8669 Personal history of other diseases of the nervous system and sense organs: Secondary | ICD-10-CM | POA: Diagnosis not present

## 2014-11-27 DIAGNOSIS — R002 Palpitations: Secondary | ICD-10-CM | POA: Diagnosis present

## 2014-11-27 DIAGNOSIS — E785 Hyperlipidemia, unspecified: Secondary | ICD-10-CM | POA: Insufficient documentation

## 2014-11-27 DIAGNOSIS — E119 Type 2 diabetes mellitus without complications: Secondary | ICD-10-CM | POA: Insufficient documentation

## 2014-11-27 DIAGNOSIS — I10 Essential (primary) hypertension: Secondary | ICD-10-CM | POA: Insufficient documentation

## 2014-11-27 DIAGNOSIS — Z8739 Personal history of other diseases of the musculoskeletal system and connective tissue: Secondary | ICD-10-CM | POA: Insufficient documentation

## 2014-11-27 DIAGNOSIS — Z7982 Long term (current) use of aspirin: Secondary | ICD-10-CM | POA: Insufficient documentation

## 2014-11-27 LAB — CBC
HEMATOCRIT: 36.4 % (ref 36.0–46.0)
HEMOGLOBIN: 12.2 g/dL (ref 12.0–15.0)
MCH: 28 pg (ref 26.0–34.0)
MCHC: 33.5 g/dL (ref 30.0–36.0)
MCV: 83.5 fL (ref 78.0–100.0)
PLATELETS: 206 10*3/uL (ref 150–400)
RBC: 4.36 MIL/uL (ref 3.87–5.11)
RDW: 14.2 % (ref 11.5–15.5)
WBC: 4.5 10*3/uL (ref 4.0–10.5)

## 2014-11-27 LAB — I-STAT TROPONIN, ED: TROPONIN I, POC: 0.01 ng/mL (ref 0.00–0.08)

## 2014-11-27 LAB — BASIC METABOLIC PANEL
Anion gap: 8 (ref 5–15)
BUN: 12 mg/dL (ref 6–20)
CHLORIDE: 107 mmol/L (ref 101–111)
CO2: 26 mmol/L (ref 22–32)
CREATININE: 0.91 mg/dL (ref 0.44–1.00)
Calcium: 9.2 mg/dL (ref 8.9–10.3)
GFR calc non Af Amer: >60 mL/min (ref >60)
Glucose, Bld: 99 mg/dL (ref 65–99)
POTASSIUM: 3.4 mmol/L — AB (ref 3.5–5.1)
Sodium: 141 mmol/L (ref 135–145)

## 2014-11-27 NOTE — ED Notes (Signed)
Pt is in stable condition upon d/c and is escorted via wheelchair by staff from ED.

## 2014-11-27 NOTE — Discharge Instructions (Signed)

## 2014-11-27 NOTE — ED Notes (Signed)
Pt sent here from triage urgent care due to palpitations, HR 66 at triage. No acute distress noted. Denies CP or SOB.

## 2014-11-27 NOTE — ED Provider Notes (Signed)
CSN: [id number]     Arrival date & time 11/27/14  1645 History   First MD Initiated Contact with Patient 11/27/14 1653     Chief Complaint  Patient presents with  . Palpitations     (Consider location/radiation/quality/duration/timing/severity/associated sxs/prior Treatment) HPI Comments: 69 year old female presenting with palpitations beginning at 1 PM today while she was laying in her recliner. Reports a sensation of her heart beating fast and as if she can "see her heartbeat". Prior to living in her recliner, she was not doing anything exertional. Palpitations lasted about 3 hours, until she arrived at urgent care for evaluation. Palpitations slowly eased off on their own without any intervention. At the urgent care, they didn't EKG and advised her to go to the emergency department. At no point has she had chest pain, shortness of breath, nausea, vomiting, diaphoresis. Denies recent illness. States she has been feeling perfectly fine. One year ago, she saw Dr. Einar Gip and states she had a stress test which was normal. No issues since.  Patient is a 69 y.o. female presenting with palpitations. The history is provided by the patient.  Palpitations   Past Medical History  Diagnosis Date  . 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 OSA (obstructive sleep apnea)     NPSG 03/08/2010  AHI 19.8/hr   . Hypertension   . Hyperlipidemia   . Type 2 diabetes mellitus   . Osteopenia    Past Surgical History  Procedure Laterality Date  . Total abdominal hysterectomy    . Bronchoscopy      dx'd sarcoid  . Neuroplasty / transposition ulnar nerve at elbow  09/2011   Family History  Problem Relation Age of Onset  . Allergies Sister   . Heart disease Sister   . Allergies Mother   . Allergies Daughter   . Asthma Daughter   . Heart disease Sister   . Gout Brother   . Heart failure Father     History  Substance Use Topics  . Smoking status: Never Smoker   . Smokeless tobacco: Never Used  . Alcohol Use: No   OB History    Gravida Para Term Preterm AB TAB SAB Ectopic Multiple Living   3 3        3      Review of Systems  Cardiovascular: Positive for palpitations.  All other systems reviewed and are negative.     Allergies  Motrin and Omeprazole  Home Medications   Prior to Admission medications   Medication Sig Start Date End Date Taking? Authorizing Provider  alendronate (FOSAMAX) 70 MG tablet Take 1 tablet by mouth once a week. On Friday 06/29/14   Historical Provider, MD  aspirin 81 MG tablet Take 81 mg by mouth. Every once in a while.     Historical Provider, MD  Cholecalciferol (VITAMIN D3) 2000 UNITS TABS Take 1 tablet by mouth daily.      Historical Provider, MD  felodipine (PLENDIL) 10 MG 24 hr tablet Take 10 mg by mouth daily.      Historical Provider, MD  fish oil-omega-3 fatty acids 1000 MG capsule Take 2 capsules by mouth daily.      Historical Provider, MD  fluvastatin XL (LESCOL XL) 80 MG 24 hr tablet Take 80 mg by mouth daily.      Historical Provider, MD  GLUCOSAMINE-CHONDROITIN PO Take 2 tablets by mouth  daily.      Historical Provider, MD  Misc Natural Products (TART CHERRY ADVANCED PO) Take 2 tablets by mouth daily.    Historical Provider, MD  NITROSTAT 0.4 MG SL tablet Place 1 tablet under the tongue as needed. 02/29/12   Historical Provider, MD  traMADol (ULTRAM) 50 MG tablet Take 1-2 tablets by mouth at bedtime as needed    Historical Provider, MD   BP 163/58 mmHg  Pulse 66  Temp(Src) 98 F (36.7 C) (Oral)  Resp 18  Ht 4\' 11"  (1.499 m)  Wt 152 lb (68.947 kg)  BMI 30.68 kg/m2  SpO2 96% Physical Exam  Constitutional: She is oriented to person, place, and time. She appears well-developed and well-nourished. No distress.  HENT:  Head: Normocephalic and atraumatic.  Mouth/Throat: Oropharynx is clear and moist.  Eyes: Conjunctivae and EOM  are normal. Pupils are equal, round, and reactive to light.  Neck: Normal range of motion. Neck supple. No JVD present.  Cardiovascular: Normal rate, regular rhythm, normal heart sounds and intact distal pulses.   No extremity edema.  Pulmonary/Chest: Effort normal and breath sounds normal. No respiratory distress.  Abdominal: Soft. Bowel sounds are normal. There is no tenderness.  Musculoskeletal: Normal range of motion. She exhibits no edema.  Neurological: She is alert and oriented to person, place, and time. She has normal strength. No sensory deficit.  Speech fluent, goal oriented. Moves limbs without ataxia. Equal grip strength bilateral.  Skin: Skin is warm and dry. She is not diaphoretic.  Psychiatric: She has a normal mood and affect. Her behavior is normal.  Nursing note and vitals reviewed.   ED Course  Procedures (including critical care time) Labs Review Labs Reviewed  BASIC METABOLIC PANEL - Abnormal; Notable for the following:    Potassium 3.4 (*)    All other components within normal limits  CBC  I-STAT TROPOININ, ED    Imaging Review Dg Chest 2 View  11/27/2014   CLINICAL DATA:  Heart palpitations. Hypertension. History of sarcoid.  EXAM: CHEST  2 VIEW  COMPARISON:  09/18/2012  FINDINGS: Heart size is normal. There is no pleural effusion or edema. No airspace consolidation identified. Mild diffuse coarsened interstitial markings appear similar to the previous exam. No superimposed airspace consolidation.  IMPRESSION: 1. Chronic interstitial coarsening. 2. No acute findings.   Electronically Signed   By: Kerby Moors M.D.   On: 11/27/2014 17:27     EKG Interpretation   Date/Time:  Saturday November 27 2014 16:51:07 EDT Ventricular Rate:  67 PR Interval:  152 QRS Duration: 118 QT Interval:  438 QTC Calculation: 462 R Axis:   -18 Text Interpretation:  Normal sinus rhythm Incomplete right bundle branch  block Nonspecific T wave abnormality Confirmed by HARRISON   MD, FORREST  ([id number]) on 11/27/2014 5:09:56 PM      MDM   Final diagnoses:  Palpitations   Nontoxic appearing, NAD. AF VSS. Asymptomatic here in the emergency department. At no point did she have any chest pain or shortness of breath. Workup without any acute findings. Doubt ACS. Doubt PE. Patient stable for discharge. Advised her to follow-up with both her PCP and cardiologist. Return precautions given. Patient states understanding of treatment care plan and is agreeable.  Discussed with attending Dr. Aline Brochure who also evaluated patient and agrees with plan of care.   Carman Ching, PA-C 11/27/14 1759  Pamella Pert, MD 11/28/14 [phone]

## 2014-12-16 ENCOUNTER — Encounter: Payer: Medicare Other | Attending: Internal Medicine

## 2014-12-16 VITALS — Ht 59.0 in | Wt 151.8 lb

## 2014-12-16 DIAGNOSIS — E119 Type 2 diabetes mellitus without complications: Secondary | ICD-10-CM

## 2014-12-16 DIAGNOSIS — Z6830 Body mass index (BMI) 30.0-30.9, adult: Secondary | ICD-10-CM | POA: Diagnosis not present

## 2014-12-16 DIAGNOSIS — Z713 Dietary counseling and surveillance: Secondary | ICD-10-CM | POA: Diagnosis not present

## 2014-12-19 NOTE — Assessment & Plan Note (Signed)
Educated again about importance of meeting compliance guidelines for her own benefit. Needs to use her machine at least 4 hours a night, at least 70% of nights. Plan-DME [facility] change CPAP to fixed pressure 11

## 2014-12-23 DIAGNOSIS — E119 Type 2 diabetes mellitus without complications: Secondary | ICD-10-CM | POA: Diagnosis not present

## 2014-12-23 NOTE — Progress Notes (Signed)

## 2014-12-23 NOTE — Progress Notes (Signed)

## 2014-12-30 DIAGNOSIS — E119 Type 2 diabetes mellitus without complications: Secondary | ICD-10-CM

## 2015-01-03 NOTE — Progress Notes (Signed)
Patient was seen on 12/30/14 for the third of a series of three diabetes self-management courses at the [facility].   Catalina Gravel the amount of activity recommended for healthy living . Describe activities suitable for individual needs . Identify ways to regularly incorporate activity into daily life . Identify barriers to activity and ways to over come these barriers  Identify diabetes medications being personally used and their primary action for lowering glucose and possible side effects . Describe role of stress on blood glucose and develop strategies to address psychosocial issues . Identify diabetes complications and ways to prevent them  Explain how to manage diabetes during illness . Evaluate success in meeting personal goal . Establish 2-3 goals that they will plan to diligently work on until they return for the  32-month follow-up visit  Goals:   I will count my carb choices at most meals and snacks  I will be active  7 times a week  I will test my glucose at least 1 times a day, 7 days a week  I will look at patterns in my record book at least 4 days a month  Exclude stressful thing/people from my life  Your patient has identified these potential barriers to change:  Stress  Your patient has identified their diabetes self-care support plan as  [facility] On-line Resources Plan:  Attend Core 4 in 4 months

## 2015-02-17 ENCOUNTER — Encounter: Payer: Self-pay | Admitting: Interventional Cardiology

## 2015-02-17 ENCOUNTER — Ambulatory Visit (INDEPENDENT_AMBULATORY_CARE_PROVIDER_SITE_OTHER): Payer: Medicare Other | Admitting: Interventional Cardiology

## 2015-02-17 VITALS — BP 140/74 | HR 69 | Ht 59.0 in | Wt 152.4 lb

## 2015-02-17 DIAGNOSIS — I450 Right fascicular block: Secondary | ICD-10-CM

## 2015-02-17 DIAGNOSIS — R0600 Dyspnea, unspecified: Secondary | ICD-10-CM | POA: Insufficient documentation

## 2015-02-17 DIAGNOSIS — I4510 Unspecified right bundle-branch block: Secondary | ICD-10-CM | POA: Insufficient documentation

## 2015-02-17 DIAGNOSIS — I313 Pericardial effusion (noninflammatory): Secondary | ICD-10-CM | POA: Insufficient documentation

## 2015-02-17 DIAGNOSIS — E118 Type 2 diabetes mellitus with unspecified complications: Secondary | ICD-10-CM

## 2015-02-17 DIAGNOSIS — D869 Sarcoidosis, unspecified: Secondary | ICD-10-CM | POA: Diagnosis not present

## 2015-02-17 DIAGNOSIS — I3139 Other pericardial effusion (noninflammatory): Secondary | ICD-10-CM

## 2015-02-17 DIAGNOSIS — R0609 Other forms of dyspnea: Secondary | ICD-10-CM | POA: Insufficient documentation

## 2015-02-17 DIAGNOSIS — I319 Disease of pericardium, unspecified: Secondary | ICD-10-CM

## 2015-02-17 DIAGNOSIS — G4733 Obstructive sleep apnea (adult) (pediatric): Secondary | ICD-10-CM | POA: Diagnosis not present

## 2015-02-17 DIAGNOSIS — I10 Essential (primary) hypertension: Secondary | ICD-10-CM | POA: Diagnosis not present

## 2015-02-17 DIAGNOSIS — R011 Cardiac murmur, unspecified: Secondary | ICD-10-CM | POA: Insufficient documentation

## 2015-02-17 HISTORY — DX: Unspecified right bundle-branch block: I45.10

## 2015-02-17 HISTORY — DX: Other pericardial effusion (noninflammatory): I31.39

## 2015-02-17 LAB — BRAIN NATRIURETIC PEPTIDE: PRO B NATRI PEPTIDE: 25 pg/mL (ref 0.0–100.0)

## 2015-02-17 NOTE — Progress Notes (Signed)
Cardiology Office Note   Date:  02/17/2015   ID:  Mariah Burns, DOB Oct 29, 1945, MRN [id number]  PCP:  [name], MD  Cardiologist:  Sinclair Grooms, MD   Chief Complaint  Patient presents with  . Shortness of Breath      History of Present Illness: Mariah Burns is a 69 y.o. female who presents for dyspnea. She has had a prior cardiac evaluation by Dr. Adrian Prows. Results of that evaluation are not available. There is also a history of hypertension, type 2 diabetes, sarcoidosis, hyperlipidemia, and obstructive sleep apnea.  In June 2016 the patient developed a sudden episode of tachycardia with associated dyspnea. The episode lasted greater than 2 hours.  It started in the early afternoon after eating lunch. She had never had this sensation previously. Since recovery from that episode she has had no recurrence of palpitation. She also denies even brief episodes of similar complaint. She has noticed some exertional dyspnea but no chest discomfort. There is a 2-4 year history of diet controlled diabetes mellitus.  She cannot tell me why she had the evaluation with Dr.G. It apparently included an echocardiogram and an exercise treadmill test. She states that her diagnosis was never given.     Past Medical History  Diagnosis Date  . 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 OSA (obstructive sleep apnea)     NPSG 03/08/2010  AHI 19.8/hr   . Hypertension   . Hyperlipidemia   . Type 2 diabetes mellitus   . Osteopenia     Past Surgical History  Procedure Laterality Date  . Total abdominal hysterectomy    . Bronchoscopy      dx'd sarcoid  . Neuroplasty / transposition ulnar nerve at elbow  09/2011     Current Outpatient Prescriptions  Medication Sig Dispense Refill  . aspirin 81 MG tablet Take 81 mg by mouth. Every once in a while.     . Cholecalciferol  (VITAMIN D3) 2000 UNITS TABS Take 1 tablet by mouth daily.      . felodipine (PLENDIL) 10 MG 24 hr tablet Take 10 mg by mouth daily.      . fish oil-omega-3 fatty acids 1000 MG capsule Take 2 capsules by mouth daily.      . fluvastatin XL (LESCOL XL) 80 MG 24 hr tablet Take 80 mg by mouth daily.      Marland Kitchen GLUCOSAMINE-CHONDROITIN PO Take 2 tablets by mouth daily.      . Misc Natural Products (TART CHERRY ADVANCED PO) Take 2 tablets by mouth daily.    Marland Kitchen NITROSTAT 0.4 MG SL tablet Place 1 tablet under the tongue every 5 (five) minutes as needed for chest pain.     . traMADol (ULTRAM) 50 MG tablet Take 1-2 tablets by mouth at bedtime as needed for pain.     No current facility-administered medications for this visit.    Allergies:   Fosamax; Omeprazole; and Motrin    Social History:  The patient  reports that she has never smoked. She has never used smokeless tobacco. She reports that she does not drink alcohol or use illicit drugs.   Family History:  The patient's family history includes Allergies in her daughter, mother, and sister; Gout in her brother and brother; Healthy in her daughter and daughter; Heart disease in her brother, sister, and sister; Heart failure  in her father; Hypertension in her brother, brother, brother, and brother; Lupus in her sister; Post-traumatic stress disorder in her brother.    ROS:  Please see the history of present illness.   Otherwise, review of systems are positive for back discomfort occasional chest discomfort and shortness of breath. Palpitations as noted above..   All other systems are reviewed and negative.    PHYSICAL EXAM: VS:  BP 140/74 mmHg  Pulse 69  Ht 4\' 11"  (1.499 m)  Wt 69.128 kg (152 lb 6.4 oz)  BMI 30.76 kg/m2  SpO2 97% , BMI Body mass index is 30.76 kg/(m^2). GEN: Well nourished, well developed, in no acute distress HEENT: normal Neck: no JVD, carotid bruits, or masses Cardiac: RRR.  There is a 2/6 systolic crescendo decrescendo murmur.  No rub, or gallop. There is no edema. Respiratory:  clear to auscultation bilaterally, normal work of breathing. GI: soft, nontender, nondistended, + BS MS: no deformity or atrophy Skin: warm and dry, no rash Neuro:  Strength and sensation are intact Psych: euthymic mood, full affect   EKG:  EKG is not ordered today. The ekg reveals normal sinus rhythm with incomplete right bundle branch block versus RVH from the June the emergency room visit.   Recent Labs: 11/27/2014: BUN 12; Creatinine, Ser 0.91; Hemoglobin 12.2; Platelets 206; Potassium 3.4*; Sodium 141    Lipid Panel No results found for: CHOL, TRIG, HDL, CHOLHDL, VLDL, LDLCALC, LDLDIRECT    Wt Readings from Last 3 Encounters:  02/17/15 69.128 kg (152 lb 6.4 oz)  12/23/14 68.856 kg (151 lb 12.8 oz)  11/27/14 68.947 kg (152 lb)      Other studies Reviewed: Additional studies/ records that were reviewed today include: I reviewed in detail the records from Dr Baird Cancer. I reviewed the emergency room visit and evaluation from June 2016.Marland Kitchen The findings include right ventricular conduction delay is noted.. An echocardiogram performed in 2004 demonstrated a moderate pericardial effusion. This was prior to the diagnosis of sarcoidosis. Heart valves were normal at that time.    ASSESSMENT AND PLAN:   1. Dyspnea on exertion Uncertain need the allergy and may be related to diastolic dysfunction. Rule out anginal equivalent  2. Essential hypertension Borderline control  3. DM (diabetes mellitus), type 2 with complications Diet controlled  4. Obstructive sleep apnea Wears C Pap  5. Sarcoidosis Followed by pulmonary 1 and she nearly  6. Episode of tachycardia in June 2016 without documented arrhythmia.  Baseline EKG reveals incomplete right bundle branch block. Certainly atrial fibrillation and PSVT would be considerations. We will further evaluate if palpitations recur.  7. Systolic heart murmur  Compatible with aortic  valve sclerosis  8. History of pericardial effusion,  2004 prior to the diagnosis of sarcoidosis.   Current medicines are reviewed at length with the patient today.  The patient has the following concerns regarding medicines: No discussion concerning her medications as there are no active CV therapies. Apparently Dr. Mellody Dance started carvedilol but she discontinued it because it caused her to feel weak..  The following changes/actions have been instituted:    Obtain previous cardiac workup from Dr. Einar Gip. This may have been done within the past 2 years.  2-D Doppler echocardiogram to reassess heart murmur. I suspect aortic valve sclerosis. This will also allow [place] to follow-up on the possible presence of pericardial effusion  BNP  If recurrent episodes of tachycardia, will need to wear a continuous monitor. We discussed this today and she is not in favor  currently as there've been no recurrent episodes.   We will consolidate thoughts concerning her condition after obtaining the information from Dr. Darnell Level. and reviewing and updated ECHO  I advocated low-salt diet and weight reduction for blood pressure control    Labs/ tests ordered today include:   Orders Placed This Encounter  Procedures  . B Nat Peptide  . Echocardiogram     Disposition:   FU with HS in 6 weeks  Signed, Sinclair Grooms, MD  02/17/2015 9:22 AM    [facility] [place], [place], [place]  [address] Phone: [phone]; Fax: [phone]

## 2015-02-17 NOTE — Patient Instructions (Signed)
Medication Instructions:  Your physician recommends that you continue on your current medications as directed. Please refer to the Current Medication list given to you today.   Labwork: Bnp today  Testing/Procedures: Your physician has requested that you have an echocardiogram. Echocardiography is a painless test that uses sound waves to create images of your heart. It provides your doctor with information about the size and shape of your heart and how well your heart's chambers and valves are working. This procedure takes approximately one hour. There are no restrictions for this procedure.   Follow-Up: You have a follow up appointment scheduled on 03/29/15 @ 9:45am  Any Other Special Instructions Will Be Listed Below (If Applicable).

## 2015-02-18 ENCOUNTER — Telehealth: Payer: Self-pay | Admitting: Interventional Cardiology

## 2015-02-18 NOTE — Telephone Encounter (Signed)
ROI faxed to [facility] office records rec placed in chart prep bin.

## 2015-02-22 ENCOUNTER — Telehealth: Payer: Self-pay

## 2015-02-22 NOTE — Telephone Encounter (Signed)
Pt aware of lab results. No evidence of fluid buildup In the lungs based on the lab tests Pt verbalized understanding.

## 2015-02-22 NOTE — Telephone Encounter (Signed)
-----   Message from Belva Crome, MD sent at 02/18/2015  6:12 PM EDT ----- No evidence of fluid buildup  In the lungs based on the lab tests

## 2015-02-24 ENCOUNTER — Other Ambulatory Visit: Payer: Self-pay

## 2015-02-24 ENCOUNTER — Ambulatory Visit (HOSPITAL_COMMUNITY): Payer: Medicare Other | Attending: Cardiology

## 2015-02-24 DIAGNOSIS — I517 Cardiomegaly: Secondary | ICD-10-CM | POA: Insufficient documentation

## 2015-02-24 DIAGNOSIS — I272 Other secondary pulmonary hypertension: Secondary | ICD-10-CM | POA: Insufficient documentation

## 2015-02-24 DIAGNOSIS — I340 Nonrheumatic mitral (valve) insufficiency: Secondary | ICD-10-CM | POA: Diagnosis not present

## 2015-02-24 DIAGNOSIS — R0609 Other forms of dyspnea: Secondary | ICD-10-CM

## 2015-02-24 DIAGNOSIS — I313 Pericardial effusion (noninflammatory): Secondary | ICD-10-CM | POA: Diagnosis not present

## 2015-02-24 DIAGNOSIS — I351 Nonrheumatic aortic (valve) insufficiency: Secondary | ICD-10-CM | POA: Insufficient documentation

## 2015-02-24 DIAGNOSIS — R0600 Dyspnea, unspecified: Secondary | ICD-10-CM | POA: Insufficient documentation

## 2015-02-25 ENCOUNTER — Telehealth: Payer: Self-pay

## 2015-02-25 NOTE — Telephone Encounter (Signed)
Pt aware of echo results. Normal LV function. 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 Small circumferential pericardial effusion. Heart murmur is related to vigorous pumping function. No worries based upon this study. Pt verbalized understanding.

## 2015-02-25 NOTE — Telephone Encounter (Signed)
-----   Message from Belva Crome, MD sent at 02/24/2015  6:29 PM EDT ----- Normal LV function. 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 Small circumferential pericardial effusion. Heart murmur is related to vigorous pumping function. No worries based upon this study.

## 2015-03-29 ENCOUNTER — Ambulatory Visit (INDEPENDENT_AMBULATORY_CARE_PROVIDER_SITE_OTHER): Payer: Medicare Other | Admitting: Interventional Cardiology

## 2015-03-29 ENCOUNTER — Encounter: Payer: Self-pay | Admitting: Interventional Cardiology

## 2015-03-29 VITALS — BP 140/70 | HR 70 | Ht 59.0 in | Wt 153.4 lb

## 2015-03-29 DIAGNOSIS — G4733 Obstructive sleep apnea (adult) (pediatric): Secondary | ICD-10-CM | POA: Diagnosis not present

## 2015-03-29 DIAGNOSIS — R0609 Other forms of dyspnea: Secondary | ICD-10-CM

## 2015-03-29 DIAGNOSIS — I450 Right fascicular block: Secondary | ICD-10-CM

## 2015-03-29 DIAGNOSIS — R0600 Dyspnea, unspecified: Secondary | ICD-10-CM

## 2015-03-29 DIAGNOSIS — I4510 Unspecified right bundle-branch block: Secondary | ICD-10-CM

## 2015-03-29 DIAGNOSIS — I319 Disease of pericardium, unspecified: Secondary | ICD-10-CM

## 2015-03-29 DIAGNOSIS — I313 Pericardial effusion (noninflammatory): Secondary | ICD-10-CM

## 2015-03-29 DIAGNOSIS — I10 Essential (primary) hypertension: Secondary | ICD-10-CM

## 2015-03-29 DIAGNOSIS — I3139 Other pericardial effusion (noninflammatory): Secondary | ICD-10-CM

## 2015-03-29 NOTE — Progress Notes (Signed)
Cardiology Office Note   Date:  03/29/2015   ID:  Mariah Burns, DOB June 11, 1945, MRN [id number]  PCP:  Mariah Greenland, MD  Cardiologist:  Mariah Grooms, MD   Chief Complaint  Patient presents with  . Shortness of Breath      History of Present Illness: Mariah Burns is a 69 y.o. female who presents for  Mild LVH, sarcoidosis, mild pulmonary hypertension, hypertension, hyperlipidemia, and diabetes.   Patient has experienced some mild to moderate exertional dyspnea. She also had 1 instance of palpitation associated with rapid heartbeat. This is not recurred. An evaluation that included an echocardiogram, BNP, and review of the previous cardiac evaluation by Dr.Ganji demonstrates minimal left ventricular hypertrophy, normal LV function, mild pulmonary hypertension, and a normal nuclear perfusion study with stress.   She has not been wearing her C Pap apparatus.  There are no clinical features to suggest coronary artery disease. She had a low risk myocardial perfusion study 1-1/2 years ago at the office of Dr. Einar Burns. If further ischemic evaluation is necessary it would need to be with coronary angiography.    Past Medical History  Diagnosis Date  . 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 OSA (obstructive sleep apnea)     NPSG 03/08/2010  AHI 19.8/hr   . Hypertension   . Hyperlipidemia   . Type 2 diabetes mellitus ([place])   . Osteopenia     Past Surgical History  Procedure Laterality Date  . Total abdominal hysterectomy    . Bronchoscopy      dx'd sarcoid  . Neuroplasty / transposition ulnar nerve at elbow  09/2011     Current Outpatient Prescriptions  Medication Sig Dispense Refill  . aspirin 81 MG tablet Take 81 mg by mouth. Every once in a while.     . Cholecalciferol (VITAMIN D3) 2000 UNITS TABS Take 1 tablet by mouth daily.      . felodipine  (PLENDIL) 10 MG 24 hr tablet Take 10 mg by mouth daily.      . fish oil-omega-3 fatty acids 1000 MG capsule Take 2 capsules by mouth daily.      . fluvastatin XL (LESCOL XL) 80 MG 24 hr tablet Take 80 mg by mouth daily.      Marland Kitchen GLUCOSAMINE-CHONDROITIN PO Take 2 tablets by mouth daily.      . Misc Natural Products (TART CHERRY ADVANCED PO) Take 2 tablets by mouth daily.    Marland Kitchen NITROSTAT 0.4 MG SL tablet Place 1 tablet under the tongue every 5 (five) minutes as needed for chest pain.     . traMADol (ULTRAM) 50 MG tablet Take 1-2 tablets by mouth at bedtime as needed for pain.     No current facility-administered medications for this visit.    Allergies:   Fosamax; Omeprazole; and Motrin    Social History:  The patient  reports that she has never smoked. She has never used smokeless tobacco. She reports that she does not drink alcohol or use illicit drugs.   Family History:  The patient's family history includes Allergies in her daughter, mother, and sister; Gout in her brother and brother; Healthy in her daughter and daughter; Heart disease in her brother, sister, and sister; Heart failure in her father; Hypertension in her brother, brother, brother, and brother; Lupus in her sister; Post-traumatic stress disorder in her  brother.    ROS:  Please see the history of present illness.   Otherwise, review of systems are positive for  Snoring, cough, difficulty sleeping with C Pap. No recent equipment update..   All other systems are reviewed and negative.    PHYSICAL EXAM: VS:  BP 140/70 mmHg  Pulse 70  Ht 4\' 11"  (1.499 m)  Wt 69.582 kg (153 lb 6.4 oz)  BMI 30.97 kg/m2  SpO2 98% , BMI Body mass index is 30.97 kg/(m^2). GEN: Well nourished, well developed, in no acute distress HEENT: normal Neck: no JVD, carotid bruits, or masses Cardiac: RRR.  There is no murmur, rub, or gallop. There is no edema. Respiratory:  clear to auscultation bilaterally, normal work of breathing. GI: soft, nontender,  nondistended, + BS MS: no deformity or atrophy Skin: warm and dry, no rash Neuro:  Strength and sensation are intact Psych: euthymic mood, full affect   EKG:  EKG is not ordered today   Recent Labs: 11/27/2014: BUN 12; Creatinine, Ser 0.91; Hemoglobin 12.2; Platelets 206; Potassium 3.4*; Sodium 141 02/17/2015: Pro B Natriuretic peptide (BNP) 25.0    Lipid Panel No results found for: CHOL, TRIG, HDL, CHOLHDL, VLDL, LDLCALC, LDLDIRECT    Wt Readings from Last 3 Encounters:  03/29/15 69.582 kg (153 lb 6.4 oz)  02/17/15 69.128 kg (152 lb 6.4 oz)  12/23/14 68.856 kg (151 lb 12.8 oz)      Other studies Reviewed: Additional studies/ records that were reviewed today include:  Records from [facility]. The findings include  Office notes and prior evaluation..    ASSESSMENT AND PLAN:  1. Dyspnea on exertion  stable with mild to moderate dyspnea on exertion. Workup including a repeat echo and BNP were mostly unremarkable. There was mild pulmonary hypertension an mild LVH unchanged from 2013. Negative ischemic workup in 2013.  2. Essential hypertension  adequate control  3. Obstructive sleep apnea  not currently wearing her C Pap  4. Pericardial effusion  no evidence of pericardial effusion on recent echo  5. Incomplete right bundle branch block  no change  Current medicines are reviewed at length with the patient today.  The patient has the following concerns regarding medicines:  Questions whether she should resume carvedilol which was prescribed in 2013. She has not taken this medication in over a year..  The following changes/actions have been instituted:     Reassurance with reference to the heart   Expressed the need to resume C Pap therapy for sleep apnea   Continuous ambulatory monitoring if palpitations recur. It would be to exclude the possibility of paroxysmal atrial fibrillation.  If dyspnea worsens we will need to do further evaluation perhaps with coronary  angiography.  Clinical observation for now  Labs/ tests ordered today include:  No orders of the defined types were placed in this encounter.     Disposition:   FU with HS   When necessary    Signed, Mariah Grooms, MD  03/29/2015 11:03 AM    [facility] [place], [place], [place]  [address] Phone: [phone]; Fax: [phone]

## 2015-03-29 NOTE — Patient Instructions (Signed)
Medication Instructions:  No changes.  Labwork: none  Testing/Procedures: None   Follow-Up: You do not need to schedule a follow up appointment.    If you need a refill on your cardiac medications before your next appointment, please call your pharmacy.

## 2015-03-30 ENCOUNTER — Telehealth: Payer: Self-pay | Admitting: Internal Medicine

## 2015-03-30 DIAGNOSIS — G4733 Obstructive sleep apnea (adult) (pediatric): Secondary | ICD-10-CM

## 2015-03-30 NOTE — Telephone Encounter (Signed)
Spoke with pt. States that she is having issues with her CPAP machine. The machine is not tracking how many hours she is using the machine. Advised her that she would need to contact her home care company about this. Also she is only using the machine 2 hours per night for the past few months. Last night she woke up and her mouth and sinuses were very dry. She wants her pressure changed, it's currently at 11cm.  CY - please advise. Thanks.

## 2015-03-30 NOTE — Telephone Encounter (Signed)
Spoke with pt. She is aware of CY's response. Order has been placed. Nothing further was needed.

## 2015-03-30 NOTE — Telephone Encounter (Signed)
Please order CPAP download and ask her to have her DME company check her machine for any mechanical problems.

## 2015-04-15 ENCOUNTER — Encounter: Payer: Self-pay | Admitting: Internal Medicine

## 2015-05-04 ENCOUNTER — Ambulatory Visit: Payer: Medicare Other

## 2015-05-04 ENCOUNTER — Encounter: Payer: Medicare Other | Attending: Internal Medicine

## 2015-05-04 VITALS — Wt 156.1 lb

## 2015-05-04 DIAGNOSIS — E118 Type 2 diabetes mellitus with unspecified complications: Secondary | ICD-10-CM | POA: Insufficient documentation

## 2015-05-04 DIAGNOSIS — Z713 Dietary counseling and surveillance: Secondary | ICD-10-CM | POA: Insufficient documentation

## 2015-05-04 NOTE — Progress Notes (Signed)
Appt start time: 0900 end time:  1000.  Patient was seen on 05/04/2015 for a review of the series of three diabetes self-management courses at the [facility]. The following learning objectives were met by the patient during this class:  . Reviewed blood glucose monitoring and interpretation including the recommended target ranges and Hgb A1c.  . Reviewed on carb counting, importance of regularly scheduled meals/snacks, and meal planning.  . Reviewed the effects of physical activity on glucose levels and long-term glucose control.  Recommended goal of 150 minutes of physical activity/week. . Reviewed patient medications and discussed role of medication on blood glucose and possible side effects. . Discussed strategies to manage stress, psychosocial issues, and other obstacles to diabetes management. . Encouraged moderate weight reduction to improve glucose levels.   . Reviewed short-term complications: hyper- and hypo-glycemia.  Discussed causes, symptoms, and treatment options. . Reviewed prevention, detection, and treatment of long-term complications.  Discussed the role of prolonged elevated glucose levels on body systems.  Goals:  Follow Diabetes Meal Plan as instructed  Eat 3 meals and 2 snacks, every 3-5 hrs  Limit carbohydrate intake to 45 grams carbohydrate/meal Limit carbohydrate intake to 15 grams carbohydrate/snack Add lean protein foods to meals/snacks  Monitor glucose levels as instructed by your doctor  Aim for goal of 15-30 mins of physical activity daily as tolerated  Bring food record and glucose log to your next nutrition visit

## 2015-09-20 ENCOUNTER — Ambulatory Visit (INDEPENDENT_AMBULATORY_CARE_PROVIDER_SITE_OTHER): Payer: Medicare Other | Admitting: Internal Medicine

## 2015-09-20 ENCOUNTER — Encounter: Payer: Self-pay | Admitting: Internal Medicine

## 2015-09-20 ENCOUNTER — Other Ambulatory Visit: Payer: Medicare Other

## 2015-09-20 ENCOUNTER — Ambulatory Visit: Payer: Medicare Other | Admitting: Internal Medicine

## 2015-09-20 ENCOUNTER — Ambulatory Visit (INDEPENDENT_AMBULATORY_CARE_PROVIDER_SITE_OTHER)
Admission: RE | Admit: 2015-09-20 | Discharge: 2015-09-20 | Disposition: A | Discharge status: Home / Self Care | Payer: Medicare Other | Source: Ambulatory Visit | Attending: Internal Medicine | Admitting: Internal Medicine

## 2015-09-20 VITALS — BP 118/72 | HR 67 | Ht 59.5 in | Wt 155.8 lb

## 2015-09-20 DIAGNOSIS — R011 Cardiac murmur, unspecified: Secondary | ICD-10-CM | POA: Diagnosis not present

## 2015-09-20 DIAGNOSIS — G4733 Obstructive sleep apnea (adult) (pediatric): Secondary | ICD-10-CM | POA: Diagnosis not present

## 2015-09-20 DIAGNOSIS — D869 Sarcoidosis, unspecified: Secondary | ICD-10-CM

## 2015-09-20 DIAGNOSIS — R0600 Dyspnea, unspecified: Secondary | ICD-10-CM

## 2015-09-20 DIAGNOSIS — R0609 Other forms of dyspnea: Secondary | ICD-10-CM | POA: Diagnosis not present

## 2015-09-20 NOTE — Assessment & Plan Note (Signed)
Download confirms inadequate use but good control. We will try reducing the pressure from 11-9

## 2015-09-20 NOTE — Assessment & Plan Note (Signed)
I doubt her valvular heart disease is hemodynamically significant. Plan-office spirometry done-within normal limits. She is encouraged to walk more regularly for exercise and to discuss fatigue in the legs with her cardiologist for possibility of peripheral vascular disease.

## 2015-09-20 NOTE — Progress Notes (Signed)
Subjective:    Patient ID: Mariah Burns, female    DOB: 11/16/1945, 70 y.o.   MRN: [id number]  HPI 10/10/10- 48 yoF followed with hx OSA and Sarcoid, complicated by HTN and DM.  Last here June 13, 2010- We had reduced CPAP to 9 and compliance check looked good on that.  Mentions some vertigo on first getting up in AM. Asks about trying a little lower pressure.  Sometimes a nonproductive morning cough - discussed indication for cxr.   04/10/11- 64 yoF followed with hx OSA and Sarcoid, complicated by HTN and DM.  We discussed flu vaccine. She is using CPAP but says some days it makes her cough or burp or feel bloated. She asks help with sleep maintenance. Sleep onset is good but she wakes after a couple of hours. The problem may be partly uncomfortable CPAP. Her mouth gets dry despite using Biotene.  10/09/11-64 yoF followed with hx OSA and Sarcoid, complicated by HTN and DM.  Using Oxycodone for shoulder pain and helps sleep;still using CPAP; breathing is doing well Comfortable with CPAP 7/SMS, but understands DME company has changed. We will need to help put her in touch with her new company which is probably [place]. Does get some bloating and some dry mouth from CPAP suggesting air swallowing. We may be able to reduce the pressure a little if necessary.  04/15/12- 52 yoF followed with hx OSA and Sarcoid, complicated by HTN and DM.  Still using CPAP autopap/ [facility] every night for about 4-5 hours. Breathing is doing well but does have some SOB at times Mask leak dries her eyes so she takes the mask off when she gets up to the bathroom and leaves it off. 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  Cardiomegaly.  Question right hilar adenopathy versus prominent vasculature.  Original Report Authenticated By: Raelyn Number, M.D.    09/18/12- 33 yoF  followed with hx OSA and Sarcoid, complicated by HTN and DM.  FOLLOWS FOR: pt wearing CPAP everynight, on avg 2-5hrs per night- patient states she needs new mask ordered (worn), c/o dry mouth in morning from CPAP -- denies any new concerns CPAP 9/[facility]. All night every night with good control. No routine cough or chest tightness. Exercise class II days a week. She may get a little tired with that and with swimming 3 days per week.  09/18/13-67  yoF followed with hx OSA and Sarcoid, complicated by HTN and DM.  FOLLOWS FOR: wears CPAP 9/ Choice [facility] every night for about 5 hours; DME is [facility] Occasionally CPAP over dries her airway. She dislikes Biotene. Likes the nasal pillows mask. Occasional dyspnea on exertion while walking, not consistent.  09/20/14- 35  yoF followed with hx OSA and Sarcoid, complicated by HTN and DM FOLLOWS FOR: DME is [facility]; Pt states she wears CPAP auto 8-15/ every night for about 3 hours.[facility]. Takes CPAP off when she wakes up during the night almost every night. Watches TV for a while then dozes back off to sleep. Download shows inadequate use but good control on auto titration 8-15  09/20/2015-70 year old female never smoker followed for OSA, Sarcoid, complicated by HBP, DM CPAP 11/[facility]> 9 FOLLOWS FOR: DME [facility]; DL attached.Pt does not wear CPAP all night-gets to be too much air through the night Pressure too high, causes  belching Mentions dyspnea with exertion, not new. She gets a "tired" feeling in her legs while walking  No chest pain or palpitation. We reviewed chest x-ray with shallow inspiration and prominent interstitial markings consistent with old sarcoid. Moderate pulmonary hypertension identified on echocardiogram 02/24/2015 at attributed to sarcoid CXR 11/27/2014 IMPRESSION: 1. Chronic interstitial coarsening. 2. No acute findings. Electronically Signed  By: Kerby Moors M.D.  On: 11/27/2014  17:27  Review of Systems-see HPI Constitutional:   No-   weight loss, night sweats, fevers, chills, +fatigue, lassitude. HEENT:   No-  headaches, difficulty swallowing, tooth/dental problems, sore throat,       No-  sneezing, itching, ear ache, nasal congestion, post nasal drip,  CV:  No-   chest pain, orthopnea, PND, swelling in lower extremities, anasarca, dizziness, palpitations Resp: +shortness of breath with exertion or at rest.              No-   productive cough,  No non-productive cough,  No- coughing up of blood.              No-   change in color of mucus.  No- wheezing.   Skin: No-   rash or lesions. GI:  No-   heartburn, indigestion, abdominal pain, nausea, vomiting,  GU:  MS:  No-   joint pain or swelling.   Neuro-     nothing unusual Psych:  No- change in mood or affect. No depression or anxiety.  No memory loss.  Objective:   Physical Exam General- Alert, Oriented, Affect-appropriate, Distress- none acute; medium build Skin- rash-none, lesions- none, excoriation- none Lymphadenopathy- none Head- atraumatic            Eyes- Gross vision intact, PERRLA, conjunctivae clear secretions            Ears- Hearing, canals-normal            Nose- Clear, no-Septal dev, mucus, polyps, erosion, perforation             Throat- Mallampati III , mucosa- not very dry , drainage- none, tonsils- atrophic Neck- flexible , trachea midline, no stridor , thyroid nl, carotid no bruit Chest - symmetrical excursion , unlabored           Heart/CV- RRR ,  Murmur+Trace systolic AS , no gallop  , no rub, nl s1 s2                           - JVD- none , edema- none, stasis changes- none, varices- none           Lung- clear to P&A, wheeze- none, cough- none , dullness-none, rub- none           Chest wall-  Abd-  Br/ Gen/ Rectal- Not done, not indicated Extrem- cyanosis- none, clubbing, none, atrophy- none, strength- nl Neuro- grossly intact to observation

## 2015-09-20 NOTE — Patient Instructions (Signed)
Order- office spirometry     Dx dyspnea on exertion, sarcoid  Order- lab-    ACE level    Dx sarcoid   Order- CXR   Dx sarcoid   Order- DME [facility]- reduce CPAP pressure to 9    Continue mask of choice, humidifier, supplies, AirView  Dx OSA  Please call as needed

## 2015-09-20 NOTE — Assessment & Plan Note (Addendum)
Sounds like hemodynamically insignificant aortic stenosis. Note echocardiogram assessment by Dr. Smith/cardiology

## 2015-09-20 NOTE — Assessment & Plan Note (Addendum)
Chest x-ray is consistent with old stage IV sarcoid. Symptoms don't suggest active inflammatory disease. Plan-ACE level, CXR

## 2015-09-23 LAB — ACETAMINOPHEN LEVEL: Acetaminophen (Tylenol), Serum: <10 mg/L — ABNORMAL LOW (ref 10–20)

## 2015-09-26 ENCOUNTER — Other Ambulatory Visit: Payer: Medicare Other

## 2015-09-26 DIAGNOSIS — D869 Sarcoidosis, unspecified: Secondary | ICD-10-CM

## 2015-09-26 NOTE — Progress Notes (Signed)
Pt advised that additional lab ordered. Pt will be coming by today to have drawn. Advised that we will call with results. Nothing further needed.

## 2015-09-27 LAB — ANGIOTENSIN CONVERTING ENZYME: ANGIOTENSIN-CONVERTING ENZYME: 15 U/L (ref 8–52)

## 2015-09-28 ENCOUNTER — Telehealth: Payer: Self-pay | Admitting: Internal Medicine

## 2015-09-28 NOTE — Telephone Encounter (Signed)
Notes Recorded by Deneise Lever, MD on 09/20/2015 at 2:30 PM CXR- Stable thickened markings consistent with old, burned- out sarcoid as discussed at office meeting. Nothing looks active  Called spoke with pt. Reviewed results and recs. Pt voiced understanding and had no further questions.

## 2015-09-28 NOTE — Progress Notes (Signed)
Quick Note:  Called spoke with pt. Reviewed results and recs. Pt voiced understanding and had no further questions. ______ 

## 2015-09-29 ENCOUNTER — Encounter: Payer: Self-pay | Admitting: Internal Medicine

## 2015-09-30 ENCOUNTER — Telehealth: Payer: Self-pay | Admitting: Internal Medicine

## 2015-09-30 NOTE — Progress Notes (Signed)
Quick Note:  Called spoke with pt. Reviewed results and recs. Pt voiced understanding and had no further questions. ______ 

## 2015-09-30 NOTE — Telephone Encounter (Signed)
Notes Recorded by Deneise Lever, MD on 09/28/2015 at 9:29 PM ACE level is low and normal. This argues against active sarcoid. Looks like just old scarring now.  Called spoke with pt. Reviewed results and recs. Pt voiced understanding and had no further questions.

## 2015-11-02 ENCOUNTER — Telehealth: Payer: Self-pay | Admitting: Internal Medicine

## 2015-11-02 NOTE — Telephone Encounter (Signed)
Spoke with Ivin Booty at [facility]. Pt has been scheduled to see TP on 11/21/15 at 10:15am. Nothing further was needed.

## 2015-11-21 ENCOUNTER — Encounter: Payer: Self-pay | Admitting: Adult Health

## 2015-11-21 ENCOUNTER — Ambulatory Visit (INDEPENDENT_AMBULATORY_CARE_PROVIDER_SITE_OTHER): Payer: Medicare Other | Admitting: Adult Health

## 2015-11-21 VITALS — BP 130/66 | HR 63 | Temp 98.0°F | Ht 59.0 in | Wt 153.0 lb

## 2015-11-21 DIAGNOSIS — G4733 Obstructive sleep apnea (adult) (pediatric): Secondary | ICD-10-CM

## 2015-11-21 NOTE — Assessment & Plan Note (Signed)
Well compensated on C Pap good compliance  Plan  Use saline nasal spray and gel At bedtime  .  Also try Saline lubricating eye drops.  Adjust face mask for comfort.  Wear CPAP At bedtime  .  Goal is to wear for at least 4hr each night .  Work on weight loss.  Do not drive if sleepy.  Follow up Dr. Annamaria Boots  In 1 year and As needed

## 2015-11-21 NOTE — Patient Instructions (Addendum)
Use saline nasal spray and gel At bedtime  .  Also try Saline lubricating eye drops.  Adjust face mask for comfort.  Wear CPAP At bedtime  .  Goal is to wear for at least 4hr each night .  Work on weight loss.  Do not drive if sleepy.  Follow up Dr. Annamaria Boots  In 1 year and As needed

## 2015-11-21 NOTE — Progress Notes (Signed)
Subjective:    Patient ID: Mariah Burns, female    DOB: Apr 15, 1946, 70 y.o.   MRN: [id number]  HPI 70 yo female with OSA    11/21/2015 Follow up : OSA .  Pt returns for a 2 month follow up . She has recently gotten A new C Pap machine. Patient says that she likes her new machine and is doing well. She feels more rested. Uses CPAP on average 3-8 hours nightly, mask fits well. Pt c/o watery eyes in the morning. Discussed using saline eyedrops. Download shows excellent compliance . Avg usage at [place] . AHI at 4.2. Min leaks.     Past Medical History  Diagnosis Date  . 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 OSA (obstructive sleep apnea)     NPSG 03/08/2010  AHI 19.8/hr   . Hypertension   . Hyperlipidemia   . Type 2 diabetes mellitus ([place])   . Osteopenia    Current Outpatient Prescriptions on File Prior to Visit  Medication Sig Dispense Refill  . aspirin 81 MG tablet Take 81 mg by mouth. Every once in a while.     . Cholecalciferol (VITAMIN D3) 2000 UNITS TABS Take 1 tablet by mouth daily.      . felodipine (PLENDIL) 10 MG 24 hr tablet Take 10 mg by mouth daily.      . fish oil-omega-3 fatty acids 1000 MG capsule Take 2 capsules by mouth daily.      . fluvastatin XL (LESCOL XL) 80 MG 24 hr tablet Take 80 mg by mouth daily.      Marland Kitchen GLUCOSAMINE-CHONDROITIN PO Take 2 tablets by mouth daily.      . Misc Natural Products (TART CHERRY ADVANCED PO) Take 2 tablets by mouth daily.    Marland Kitchen NITROSTAT 0.4 MG SL tablet Place 1 tablet under the tongue every 5 (five) minutes as needed for chest pain.     . traMADol (ULTRAM) 50 MG tablet Take 1-2 tablets by mouth at bedtime as needed for pain.     No current facility-administered medications on file prior to visit.    Review of Systems Constitutional:   No  weight loss, night sweats,  Fevers, chills,  +fatigue, or  lassitude.  HEENT:    No headaches,  Difficulty swallowing,  Tooth/dental problems, or  Sore throat,                No sneezing, itching, ear ache,  +nasal congestion, post nasal drip,   CV:  No chest pain,  Orthopnea, PND, swelling in lower extremities, anasarca, dizziness, palpitations, syncope.   GI  No heartburn, indigestion, abdominal pain, nausea, vomiting, diarrhea, change in bowel habits, loss of appetite, bloody stools.   Resp: No shortness of breath with exertion or at rest.  No excess mucus, no productive cough,  No non-productive cough,  No coughing up of blood.  No change in color of mucus.  No wheezing.  No chest wall deformity  Skin: no rash or lesions.  GU: no dysuria, change in color of urine, no urgency or frequency.  No flank pain, no hematuria   MS:  No joint pain or swelling.  No decreased range of motion.  No back pain.  Psych:  No change in mood or affect. No depression or anxiety.  No memory loss.  Objective:   Physical Exam   Filed Vitals:   11/21/15 1038  BP: 130/66  Pulse: 63  Temp: 98 F (36.7 C)  TempSrc: Oral  Height: 4\' 11"  (1.499 m)  Weight: 153 lb (69.4 kg)  SpO2: 99%    GEN: A/Ox3; pleasant , NAD, elderly   HEENT:  [place]/AT,  EACs-clear, TMs-wnl, NOSE-clear, THROAT-clear, no lesions, no postnasal drip or exudate noted. Class 2-3 MP airway   NECK:  Supple w/ fair ROM; no JVD; normal carotid impulses w/o bruits; no thyromegaly or nodules palpated; no lymphadenopathy.  RESP  Clear  P & A; w/o, wheezes/ rales/ or rhonchi.no accessory muscle use, no dullness to percussion  CARD:  RRR, no m/r/g  , no peripheral edema, pulses intact, no cyanosis or clubbing.  GI:   Soft & nt; nml bowel sounds; no organomegaly or masses detected.  Musco: Warm bil, no deformities or joint swelling noted.   Neuro: alert, no focal deficits noted.    Skin: Warm, no lesions or rashes  Tai Skelly NP-C  [facility]  11/21/2015

## 2015-12-14 ENCOUNTER — Encounter: Payer: Self-pay | Admitting: Adult Health

## 2015-12-27 ENCOUNTER — Telehealth: Payer: Self-pay | Admitting: Internal Medicine

## 2015-12-27 DIAGNOSIS — G4733 Obstructive sleep apnea (adult) (pediatric): Secondary | ICD-10-CM

## 2015-12-27 NOTE — Telephone Encounter (Signed)
Called spoke with pt. Reviewed CY's recs. She voiced understanding and had no further questions. Order has been placed. Nothing further needed at this time.

## 2015-12-27 NOTE — Telephone Encounter (Signed)
Ok - order- DME reduce CPAP [presure to 7, continue mask of choice, humidifier, supplies, AirView  Dx OSA

## 2015-12-27 NOTE — Telephone Encounter (Signed)
Called spoke with pt. She is requesting her CPAP pressure be reduced. She c/o gas, dry mouth, watery eyes, dizziness, and not being able to stay asleep due to pressure. I explained to her that an order was placed on 09/20/15 to reduce her pressure to 9. She states that she feels this needs to be lowered. I explained that I would send a message to CY for his recs. She voiced understanding and had no further questions.   CY please advise

## 2016-03-05 ENCOUNTER — Ambulatory Visit (INDEPENDENT_AMBULATORY_CARE_PROVIDER_SITE_OTHER): Payer: Medicare Other | Admitting: Rheumatology

## 2016-03-05 DIAGNOSIS — M1712 Unilateral primary osteoarthritis, left knee: Secondary | ICD-10-CM | POA: Diagnosis not present

## 2016-03-05 DIAGNOSIS — M1711 Unilateral primary osteoarthritis, right knee: Secondary | ICD-10-CM | POA: Diagnosis not present

## 2016-03-12 ENCOUNTER — Ambulatory Visit (INDEPENDENT_AMBULATORY_CARE_PROVIDER_SITE_OTHER): Payer: Medicare Other | Admitting: Rheumatology

## 2016-03-12 DIAGNOSIS — M1711 Unilateral primary osteoarthritis, right knee: Secondary | ICD-10-CM

## 2016-03-12 DIAGNOSIS — M1712 Unilateral primary osteoarthritis, left knee: Secondary | ICD-10-CM

## 2016-06-05 ENCOUNTER — Ambulatory Visit: Payer: Medicare Other | Admitting: Cardiology

## 2016-06-05 NOTE — Progress Notes (Signed)
Cardiology Office Note   Date:  06/06/2016   ID:  Mariah Burns, DOB 10-Mar-1946, MRN [id number]  PCP:  [name], MD  Cardiologist:  Dr. Tamala Julian     Chief Complaint  Patient presents with  . Pre-op Exam    for colonoscopy      History of Present Illness: Mariah Burns is a 71 y.o. female who presents for surgical clearance for colonoscopy.    She has a hx of Mild LVH, sarcoidosis, mild pulmonary hypertension, hypertension, hyperlipidemia, and diabetes.  An evaluation that included an echocardiogram, BNP, and review of the previous cardiac evaluation by Dr.Ganji demonstrates minimal left ventricular hypertrophy, normal LV function, mild pulmonary hypertension, and a normal nuclear perfusion study with stress.   She is wearing her C Pap apparatus now .  She had a low risk myocardial perfusion study 2-1/2 years ago at the office of Dr. Einar Burns. per previous note from Dr. Tamala Julian " If further ischemic evaluation is necessary it would need to be with coronary angiography".    Today she denies any chest pain.  She does walk up 2 flights of stairs at times without chest pain.  No significant SOB.  Dr. Annamaria Boots follows her sarcoid and sleep apnea.   She has had 2 brief episodes of tachycardia a year apart that lasted about 5 mins and no associated symptoms.    She had blood in hemoccult and colonoscopy was recommended.  Scheduled for 06/13/16 with Dr. Collene Mares.  Past Medical History:  Diagnosis Date  . Hyperlipidemia   . Hypertension   . OSA (obstructive sleep apnea)    NPSG 03/08/2010  AHI 19.8/hr   . Osteopenia   . 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 Type 2 diabetes mellitus ([place])     Past Surgical History:  Procedure Laterality Date  . BRONCHOSCOPY     dx'd sarcoid  . NEUROPLASTY / TRANSPOSITION ULNAR NERVE AT ELBOW  09/2011  . TOTAL ABDOMINAL HYSTERECTOMY        Current Outpatient Prescriptions  Medication Sig Dispense Refill  . aspirin 81 MG tablet Take 81 mg by mouth. Every once in a while.     . Cholecalciferol (VITAMIN D3) 2000 UNITS TABS Take 1 tablet by mouth daily.      . felodipine (PLENDIL) 10 MG 24 hr tablet Take 10 mg by mouth daily.      . fish oil-omega-3 fatty acids 1000 MG capsule Take 2 capsules by mouth daily.      Marland Kitchen GLUCOSAMINE-CHONDROITIN PO Take 2 tablets by mouth daily.      Marland Kitchen glucose blood (ONETOUCH VERIO) test strip use as directed to check blood sugars 1 time per day dx: e11.65    . L-Methylfolate-Algae-B12-B6 (METANX) 3-90.314-2-35 MG CAPS     . Misc Natural Products (TART CHERRY ADVANCED PO) Take 2 tablets by mouth daily.    . nitroGLYCERIN (NITROSTAT) 0.4 MG SL tablet Place 1 tablet (0.4 mg total) under the tongue every 5 (five) minutes as needed for chest pain. 25 tablet 2  . ONETOUCH DELICA LANCETS 99991111 MISC stick use as directed to check blood sugars 1 time per day dx: e11.65     No current facility-administered medications for this visit.     Allergies:   Fosamax [alendronate sodium]; Omeprazole; and Motrin [ibuprofen]    Social History:  The patient  reports  that she has never smoked. She has never used smokeless tobacco. She reports that she does not drink alcohol or use drugs.   Family History:  The patient's family history includes Allergies in her daughter, mother, and sister; Gout in her brother and brother; Healthy in her daughter and daughter; Heart disease in her brother, sister, and sister; Heart failure in her father; Hypertension in her brother, brother, brother, and brother; Lupus in her sister; Post-traumatic stress disorder in her brother.    ROS:  General:no colds or fevers, mild weight loss Skin:no rashes or ulcers HEENT:no blurred vision, no congestion CV:see HPI PUL:see HPI GI:no diarrhea constipation or melena, no indigestion GU:no hematuria, no dysuria MS:no joint pain, no  claudication Neuro:no syncope, no lightheadedness Endo:+ diabetes, no thyroid disease  Wt Readings from Last 3 Encounters:  06/06/16 150 lb (68 kg)  11/21/15 153 lb (69.4 kg)  09/20/15 155 lb 12.8 oz (70.7 kg)     PHYSICAL EXAM: VS:  BP 140/70 (BP Location: Right Arm, Patient Position: Sitting, Cuff Size: Normal)   Pulse 72   Ht 4\' 11"  (1.499 m)   Wt 150 lb (68 kg)   BMI 30.30 kg/m  , BMI Body mass index is 30.3 kg/m. General:Pleasant affect, NAD Skin:Warm and dry, brisk capillary refill HEENT:normocephalic, sclera clear, mucus membranes moist Neck:supple, no JVD, no bruits  Heart:S1S2 RRR with soft systolic murmur, no gallup, rub or click Lungs:clear without rales, rhonchi, or wheezes [id number], non tender, + BS, do not palpate liver spleen or masses Ext:no lower ext edema, 2+ pedal pulses, 2+ radial pulses Neuro:alert and oriented X 3, MAE, follows commands, + facial symmetry    EKG:  EKG is ordered today. The ekg ordered today demonstrates SR with RBBB.     Recent Labs: No results found for requested labs within last 8760 hours.    Lipid Panel No results found for: CHOL, TRIG, HDL, CHOLHDL, VLDL, LDLCALC, LDLDIRECT     Other studies Reviewed: Additional studies/ records that were reviewed today include: . Echo 02/2015 Study Conclusions  - Left ventricle: The cavity size was normal. Wall thickness was   increased in a pattern of mild LVH. Systolic function was   vigorous. The estimated ejection fraction was in the range of 65%   to 70%. Wall motion was normal; there were no regional wall   motion abnormalities. Doppler parameters are consistent with   abnormal left ventricular relaxation (grade 1 diastolic   dysfunction). - Aortic valve: There was no stenosis. There was trivial   regurgitation. - Mitral valve: There was trivial regurgitation. - Left atrium: The atrium was mildly dilated. - Right ventricle: The cavity size was normal. Systolic function    was normal. - Tricuspid valve: Peak RV-RA gradient (S): 39 mm Hg. - Pulmonary arteries: PA peak pressure: 42 mm Hg (S). - Inferior vena cava: The vessel was normal in size. The   respirophasic diameter changes were in the normal range (= 50%),   consistent with normal central venous pressure. - Pericardium, extracardiac: Small circumferential pericardial   effusion.  Impressions:  - Normal LV size with mild LV hypertrophy. EF 65-70%. Normal RV   size and systolic function. No significant valvular   abnormalities. Small pericardial effusion. Mild pulmonary   hypertension.   ASSESSMENT AND PLAN:  1.  Pre-op exam for colonoscopy for melena, no chest pain and no change in her routine SOB.  She is able to walk up 2 flights of stairs without stopping without chest  pain or SOB. Previous neg nuc for ischemia 2.5 yrs ago and Echo with pulm HTN.  Will review with Dr. Tamala Julian but pt stable for colonoscopy.   2. DM-2 followed by PCP.  3. Hyperlipidemia followed by PCP       Current medicines are reviewed with the patient today.  The patient Has no concerns regarding medicines.  The following changes have been made:  See above Labs/ tests ordered today include:see above  Disposition:   FU:  see above  Signed, Cecilie Kicks, NP  06/06/2016 12:00 PM    [address], [place], [place], [place] Phone: [phone]; Fax: [phone]

## 2016-06-06 ENCOUNTER — Encounter (INDEPENDENT_AMBULATORY_CARE_PROVIDER_SITE_OTHER): Payer: Self-pay

## 2016-06-06 ENCOUNTER — Other Ambulatory Visit: Payer: Self-pay | Admitting: *Deleted

## 2016-06-06 ENCOUNTER — Encounter: Payer: Self-pay | Admitting: Cardiology

## 2016-06-06 ENCOUNTER — Ambulatory Visit (INDEPENDENT_AMBULATORY_CARE_PROVIDER_SITE_OTHER): Payer: Medicare Other | Admitting: Cardiology

## 2016-06-06 VITALS — BP 140/70 | HR 72 | Ht 59.0 in | Wt 150.0 lb

## 2016-06-06 DIAGNOSIS — I10 Essential (primary) hypertension: Secondary | ICD-10-CM

## 2016-06-06 DIAGNOSIS — I4510 Unspecified right bundle-branch block: Secondary | ICD-10-CM | POA: Diagnosis not present

## 2016-06-06 DIAGNOSIS — Z01818 Encounter for other preprocedural examination: Secondary | ICD-10-CM

## 2016-06-06 DIAGNOSIS — D869 Sarcoidosis, unspecified: Secondary | ICD-10-CM | POA: Diagnosis not present

## 2016-06-06 MED ORDER — NITROGLYCERIN 0.4 MG SL SUBL: 0.4000 mg | SUBLINGUAL_TABLET | SUBLINGUAL | 2 refills | Status: DC | PRN

## 2016-06-06 NOTE — Progress Notes (Signed)
I think it is okay and appropriate to clear for low stress/risk procedure without further workup in absence of symptoms.

## 2016-06-06 NOTE — Patient Instructions (Signed)
Medication Instructions:    Your physician recommends that you continue on your current medications as directed. Please refer to the Current Medication list given to you today.  --- If you need a refill on your cardiac medications before your next appointment, please call your pharmacy. ---  Labwork:  None ordered  Testing/Procedures:  None ordered  Follow-Up:  Your physician wants you to follow-up in: 1 year with Dr. Tamala Julian. reminder letter in the mail two months in advance. If you don't receive a letter, please call our office to schedule the follow-up appointment.  Thank you for choosing [facility]!!

## 2016-06-13 LAB — HM COLONOSCOPY

## 2016-06-18 ENCOUNTER — Ambulatory Visit (INDEPENDENT_AMBULATORY_CARE_PROVIDER_SITE_OTHER): Payer: Medicare Other | Admitting: Rheumatology

## 2016-06-18 ENCOUNTER — Encounter: Payer: Self-pay | Admitting: Rheumatology

## 2016-06-18 VITALS — BP 145/62 | HR 62 | Resp 13 | Ht 59.0 in | Wt 150.0 lb

## 2016-06-18 DIAGNOSIS — M25561 Pain in right knee: Secondary | ICD-10-CM

## 2016-06-18 DIAGNOSIS — G8929 Other chronic pain: Secondary | ICD-10-CM | POA: Diagnosis not present

## 2016-06-18 DIAGNOSIS — M170 Bilateral primary osteoarthritis of knee: Secondary | ICD-10-CM | POA: Diagnosis not present

## 2016-06-18 DIAGNOSIS — D869 Sarcoidosis, unspecified: Secondary | ICD-10-CM | POA: Diagnosis not present

## 2016-06-18 DIAGNOSIS — M25552 Pain in left hip: Secondary | ICD-10-CM

## 2016-06-18 DIAGNOSIS — M25511 Pain in right shoulder: Secondary | ICD-10-CM | POA: Diagnosis not present

## 2016-06-18 DIAGNOSIS — M25562 Pain in left knee: Secondary | ICD-10-CM

## 2016-06-18 DIAGNOSIS — M25551 Pain in right hip: Secondary | ICD-10-CM | POA: Diagnosis not present

## 2016-06-18 DIAGNOSIS — M25512 Pain in left shoulder: Secondary | ICD-10-CM

## 2016-06-18 NOTE — Progress Notes (Signed)
Office Visit Note  Patient: Mariah Burns             Date of Birth: December 26, 1945           MRN: [id number]             PCP: [name], MD Referring: Glendale Chard, MD Visit Date: 06/18/2016 Occupation: @GUAROCC @    Subjective:  No chief complaint on file. 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 female  Last seen 12/15/2015 Doing well with the pulmonary sarcoidosis. No flares. Continues to see Dr. Annamaria Boots.   Complaining of having joint pain to bilateral shoulders hips knee joints hands and feet.  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 She'll be seeing her for Dr. next week and I've asked the patient to bring it up with them and if not she can discuss it with PCP.  Patient has ongoing knee joint pain and she feels like she hurts more now than she did before she had her Hyalgan injections. I advised the patient that if she has this type of pain she may be due for knee surgery and she would benefit from a orthopedic referral. She states that the pain comes from time to time and on most a she is doing okay and is not that bad but when the pain is severe she calls a pain of 10 on a scale of 0-10   Activities of Daily Living:  Patient reports morning stiffness for 30 minutes.   Patient Denies nocturnal pain.  Difficulty dressing/grooming: Denies Difficulty climbing stairs: Reports Difficulty getting out of chair: Reports Difficulty using hands for taps, buttons, cutlery, and/or writing: Reports   Review of Systems  Constitutional: Negative for fatigue.  HENT: Negative for mouth sores and mouth dryness.   Eyes: Negative for dryness.  Respiratory: Negative for shortness of breath.   Gastrointestinal: Negative for constipation and diarrhea.  Musculoskeletal: Negative for myalgias and myalgias.  Skin: Negative for sensitivity to sunlight.  Psychiatric/Behavioral: Negative for decreased concentration and sleep  disturbance.    PMFS History:  Patient Active Problem List   Diagnosis Date Noted  . Dyspnea on exertion 02/17/2015  . Incomplete right bundle branch block 02/17/2015  . Systolic murmur 0000000  . Pericardial effusion 02/17/2015  . Osteopenia 10/05/2011  . DM (diabetes mellitus), type 2 with complications ([place]) [id number]  . SARCOIDOSIS 03/21/2007  . HYPERLIPIDEMIA 03/21/2007  . Obstructive sleep apnea 03/21/2007  . Essential hypertension 03/21/2007    Past Medical History:  Diagnosis Date  . Hyperlipidemia   . Hypertension   . OSA (obstructive sleep apnea)    NPSG 03/08/2010  AHI 19.8/hr   . Osteopenia   . 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 Type 2 diabetes mellitus ([facility])     Family History  Problem Relation Age of Onset  . Allergies Sister   . Heart disease Sister   . Lupus Sister   . Allergies Mother   . Allergies Daughter   . Healthy Daughter   . Heart disease Sister   . Gout Brother   . Hypertension Brother   . Heart disease Brother   . Heart failure Father   . Gout Brother   . Hypertension Brother   . Hypertension Brother   . Hypertension Brother   . Post-traumatic stress disorder Brother   .  Healthy Daughter    Past Surgical History:  Procedure Laterality Date  . BRONCHOSCOPY     dx'd sarcoid  . NEUROPLASTY / TRANSPOSITION ULNAR NERVE AT ELBOW  09/2011  . TOTAL ABDOMINAL HYSTERECTOMY     Social History   Social History Narrative   Patient lives at home with daughter.   Caffeine Use: none     Objective: Vital Signs: BP (!) 145/62 (BP Location: Left Arm, Patient Position: Sitting, Cuff Size: Large)   Pulse 62   Resp 13   Ht 4\' 11"  (1.499 m)   Wt 150 lb (68 kg)   BMI 30.30 kg/m    Physical Exam  Constitutional: She is oriented to person, place, and time. She appears well-developed and well-nourished.  HENT:  Head: Normocephalic  and atraumatic.  Eyes: EOM are normal. Pupils are equal, round, and reactive to light.  Cardiovascular: Normal rate, regular rhythm and normal heart sounds.  Exam reveals no gallop and no friction rub.   No murmur heard. Pulmonary/Chest: Effort normal and breath sounds normal. She has no wheezes. She has no rales.  Abdominal: Soft. Bowel sounds are normal. She exhibits no distension. There is no tenderness. There is no guarding. No hernia.  Musculoskeletal: Normal range of motion. She exhibits no edema, tenderness or deformity.  Lymphadenopathy:    She has no cervical adenopathy.  Neurological: She is alert and oriented to person, place, and time. Coordination normal.  Skin: Skin is warm and dry. Capillary refill takes less than 2 seconds. No rash noted.  Psychiatric: She has a normal mood and affect. Her behavior is normal.  Nursing note and vitals reviewed.    Musculoskeletal Exam:  Full range of motion of all joints Grip strength is equal and strong bilaterally Fiber myalgia tender points are all absent  CDAI Exam: CDAI Homunculus Exam:   Joint Counts:  CDAI Tender Joint count: 0 CDAI Swollen Joint count: 0  No synovitis   Investigation: No additional findings.  No visits with results within 4 Month(s) from this visit.  Latest known visit with results is:  Appointment on 09/26/2015  Component Date Value Ref Range Status  . Angiotensin-Converting Enzyme 09/27/2015 15  8 - 52 U/L Final    Imaging: No results found. Follow-up on pulmonary sarcoidosis, OA of the knee joint, knee joint pain  Speciality Comments: No specialty comments available.    Procedures:  No procedures performed Allergies: Fosamax [alendronate sodium]; Omeprazole; Motrin [ibuprofen]; and Fluvastatin   Assessment / Plan:     Visit Diagnoses: SARCOIDOSIS  Chronic pain of both knees  Primary osteoarthritis of both knees  Hip pain, bilateral  Acute pain of both shoulders    Plan: Sarcoidosis. Stable. #2: OA of the knee joint. Worse pain now than before despite getting Hyalgan few months ago. #3: Advised patient she may need an orthopedic referral. Patient declines at this time. 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 She will be seeing a foot doctor soon and she will bring it up with them. If that's not appropriate she will discuss with her PCP. #5: No change in treatment #6: Patient continues to do physical therapy/exercises. This includes water aerobics  Orders: No orders of the defined types were placed in this encounter.  No orders of the defined types were placed in this encounter.   Face-to-face time spent with patient was 30 minutes. 50% of time was spent in counseling and coordination of care.  Follow-Up Instructions: Return in about 6 months (around 12/16/2016) for sarcoidosis; ddd  c spine; oa kj; kj pain; sj pain;  feet neuropathy.   Eliezer Lofts, PA-C  Note - This record has been created using [employer].  Chart creation errors have been sought, but may not always  have been located. Such creation errors do not reflect on  the standard of medical care.

## 2016-08-16 ENCOUNTER — Telehealth: Payer: Self-pay | Admitting: Interventional Cardiology

## 2016-08-16 NOTE — Telephone Encounter (Signed)
If she is not having chest discomfort and new cardiac complaints, she is cleared to proceed with knee replacement surgery. Aspirin hold is okay.

## 2016-08-16 NOTE — Telephone Encounter (Signed)
Follow up message    Pt is returning Jennifer's call , surgical clearance

## 2016-08-16 NOTE — Telephone Encounter (Signed)
Spoke with pt and she denies any cardiac complaints.  Advised I will send clearance to requesting office.

## 2016-08-16 NOTE — Telephone Encounter (Signed)
Left message to call back  

## 2016-08-16 NOTE — Telephone Encounter (Signed)
Request for surgical clearance:  1. What type of surgery is being performed? Left knee scope   2. When is this surgery scheduled?  Pending   3. Are there any medications that need to be held prior to surgery and how long? ASA, if needed.   4. Name of physician performing surgery?  Dr. Mardelle Matte   5. What is your office phone and fax number? Fax [phone] Jules Husbands

## 2016-09-20 ENCOUNTER — Ambulatory Visit: Payer: Medicare Other | Admitting: Internal Medicine

## 2016-11-20 ENCOUNTER — Encounter: Payer: Self-pay | Admitting: Internal Medicine

## 2016-11-22 ENCOUNTER — Ambulatory Visit (INDEPENDENT_AMBULATORY_CARE_PROVIDER_SITE_OTHER): Payer: Medicare Other | Admitting: Internal Medicine

## 2016-11-22 ENCOUNTER — Encounter: Payer: Self-pay | Admitting: Internal Medicine

## 2016-11-22 VITALS — BP 124/64 | HR 56 | Ht 59.0 in | Wt 146.2 lb

## 2016-11-22 DIAGNOSIS — G4733 Obstructive sleep apnea (adult) (pediatric): Secondary | ICD-10-CM

## 2016-11-22 DIAGNOSIS — D869 Sarcoidosis, unspecified: Secondary | ICD-10-CM | POA: Diagnosis not present

## 2016-11-22 NOTE — Progress Notes (Signed)
Subjective:    Patient ID: Mariah Burns, female    DOB: Feb 18, 1946, 71 y.o.   MRN: [id number]  HPI female never smoker followed for OSA, Sarcoid, complicated by HBP, DM NPSG 03/08/2010  AHI 19.8/hr  Office spirometry 09/20/15- WNL-FEV1/FVC 0.85 ------------------------------------------------------------------------------------------  09/20/2015-71 year old female never smoker followed for OSA, Sarcoid, complicated by HBP, DM CPAP 11/[facility]> 9 FOLLOWS FOR: DME [facility]; DL attached.Pt does not wear CPAP all night-gets to be too much air through the night Pressure too high, causes belching Mentions dyspnea with exertion, not new. She gets a "tired" feeling in her legs while walking  No chest pain or palpitation. We reviewed chest x-ray with shallow inspiration and prominent interstitial markings consistent with old sarcoid. Moderate pulmonary hypertension identified on echocardiogram 02/24/2015 at attributed to sarcoid CXR 11/27/2014 IMPRESSION: 1. Chronic interstitial coarsening. 2. No acute findings. Electronically Signed  By: Kerby Moors M.D.  On: 11/27/2014 17:27  11/22/16- 71 year old female never smoker followed for OSA, Sarcoid, complicated by HBP, DM CPAP 7/[facility] OSA follow up for DME is CHO[facility] patient sleeps for about 5 hours a night  patient states that her CPAP is working well for her.  She denies new issues or concerns. Download confirms 77% 4 hour compliance, AHI 7.7/hour. She says she feels a little groggy in the morning and wonder what her oxygen levels were like. We had reduce CPAP pressure originally from 11, finally down to 7 for comfort. CXR 09/20/15 Chronic interstitial thickening/prominence, likely residua of prior sarcoidosis. No new opacity. No adenopathy. Stable cardiac silhouette.  Review of Systems-see HPI Constitutional:   No-   weight loss, night sweats, fevers, chills, +fatigue, lassitude. HEENT:   No-  headaches,  difficulty swallowing, tooth/dental problems, sore throat,       No-  sneezing, itching, ear ache, nasal congestion, post nasal drip,  CV:  No-   chest pain, orthopnea, PND, swelling in lower extremities, anasarca, dizziness, palpitations Resp: +shortness of breath with exertion or at rest.              No-   productive cough,  No non-productive cough,  No- coughing up of blood.              No-   change in color of mucus.  No- wheezing.   Skin: No-   rash or lesions. GI:  No-   heartburn, indigestion, abdominal pain, nausea, vomiting,  GU:  MS:  No-   joint pain or swelling.   Neuro-     nothing unusual Psych:  No- change in mood or affect. No depression or anxiety.  No memory loss.  Objective:   Physical Exam   Stable baseline exam General- Alert, Oriented, Affect-appropriate, Distress- none acute; medium build Skin- rash-none, lesions- none, excoriation- none Lymphadenopathy- none Head- atraumatic            Eyes- Gross vision intact, PERRLA, conjunctivae clear secretions            Ears- Hearing, canals-normal            Nose- Clear, no-Septal dev, mucus, polyps, erosion, perforation             Throat- Mallampati III , mucosa- not very dry , drainage- none, tonsils- atrophic Neck- flexible , trachea midline, no stridor , thyroid nl, carotid no bruit Chest - symmetrical excursion , unlabored           Heart/CV- RRR ,  Murmur+Trace systolic AS , no gallop  ,  no rub, nl s1 s2                           - JVD- none , edema- none, stasis changes- none, varices- none           Lung- clear to P&A, wheeze- none, cough- none , dullness-none, rub- none           Chest wall-  Abd-  Br/ Gen/ Rectal- Not done, not indicated Extrem- cyanosis- none, clubbing, none, atrophy- none, strength- nl Neuro- grossly intact to observation

## 2016-11-22 NOTE — Patient Instructions (Signed)
We can continue CPAP 7, mask of choice, humidifier, supplies, AirView     Dx OSA   Order- ONOX room air     Dx Sarcoid  Please call if we can help

## 2016-11-22 NOTE — Assessment & Plan Note (Signed)
Satisfactory compliance. Control could be a little better with residual AHI 7.7/hour. Medically the difference between 7.7/hour and goal of 5/hour is trivial. Since we had turned her pressure down for comfort because of air swallowing complaints, we're not going to increase now. She asks about her oxygen saturation at night so we will check overnight oximetry on CPAP.

## 2016-11-22 NOTE — Assessment & Plan Note (Signed)
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

## 2016-12-10 ENCOUNTER — Encounter: Payer: Self-pay | Admitting: Rheumatology

## 2016-12-10 ENCOUNTER — Ambulatory Visit (INDEPENDENT_AMBULATORY_CARE_PROVIDER_SITE_OTHER): Payer: Medicare Other | Admitting: Rheumatology

## 2016-12-10 VITALS — BP 128/62 | Resp 16 | Ht 59.0 in | Wt 148.0 lb

## 2016-12-10 DIAGNOSIS — G8929 Other chronic pain: Secondary | ICD-10-CM | POA: Diagnosis not present

## 2016-12-10 DIAGNOSIS — M5030 Other cervical disc degeneration, unspecified cervical region: Secondary | ICD-10-CM

## 2016-12-10 DIAGNOSIS — M25562 Pain in left knee: Secondary | ICD-10-CM

## 2016-12-10 DIAGNOSIS — M25561 Pain in right knee: Secondary | ICD-10-CM | POA: Diagnosis not present

## 2016-12-10 DIAGNOSIS — D869 Sarcoidosis, unspecified: Secondary | ICD-10-CM

## 2016-12-10 DIAGNOSIS — M810 Age-related osteoporosis without current pathological fracture: Secondary | ICD-10-CM

## 2016-12-10 NOTE — Progress Notes (Signed)
Office Visit Note  Patient: Mariah Burns             Date of Birth: [birth year]           MRN: [id number]             PCP: Glendale Chard, MD Referring: Glendale Chard, MD Visit Date: 12/10/2016 Occupation: @GUAROCC @    Subjective:  Medication Management   History of Present Illness: Mariah Burns is a 71 y.o. female    Patient saw Dr. Mardelle Matte, orthopedist, approximately March or April 2018 and had a discussion regarding her knee pain. She does not recall the exact contents of the conversation but he did offer some kind of surgery and she believes it may have been "or arthroscopic surgery". Patient has not had a return call phone call from him to schedule surgery so she is not moving forward with that at this time.   Patient recalls that Dr. Mardelle Matte may have used the word "torn meniscus" during their office visit. I do not have access to those notes nor does patient recalls precisely what was discussed and so I'm unable to know for sure what the exact discussion was.  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 She is not short of breath. No flares. Patient does not require any immunosuppressant at this time for sarcoidosis. Her pulmonologist is Dr. Annamaria Boots who she sees annually. Her last visit Dr. Janee Morn office was June 2018 and her exam was normal.     Activities of Daily Living:  Patient reports morning stiffness for 15 minutes.   Patient Denies nocturnal pain.  Difficulty dressing/grooming: Denies Difficulty climbing stairs: Denies Difficulty getting out of chair: Denies Difficulty using hands for taps, buttons, cutlery, and/or writing: Denies   No Rheumatology ROS completed.   PMFS History:  Patient Active Problem List   Diagnosis Date Noted  . Dyspnea on exertion 02/17/2015  . Incomplete right bundle branch block 02/17/2015  . Systolic murmur 82/50/5397  . Pericardial effusion 02/17/2015  . Osteopenia 10/05/2011  . DM (diabetes mellitus), type 2  with complications ([place]) 67/34/1937  . SARCOIDOSIS 03/21/2007  . HYPERLIPIDEMIA 03/21/2007  . Obstructive sleep apnea 03/21/2007  . Essential hypertension 03/21/2007    Past Medical History:  Diagnosis Date  . Hyperlipidemia   . Hypertension   . OSA (obstructive sleep apnea)    NPSG 03/08/2010  AHI 19.8/hr   . Osteopenia   . 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 Type 2 diabetes mellitus (HCC)     Family History  Problem Relation Age of Onset  . Allergies Sister   . Heart disease Sister   . Lupus Sister   . Allergies Mother   . Allergies Daughter   . Healthy Daughter   . Heart disease Sister   . Gout Brother   . Hypertension Brother   . Heart disease Brother   . Heart failure Father   . Gout Brother   . Hypertension Brother   . Hypertension Brother   . Hypertension Brother   . Post-traumatic stress disorder Brother   . Healthy Daughter    Past Surgical History:  Procedure Laterality Date  . BRONCHOSCOPY     dx'd sarcoid  . NEUROPLASTY / TRANSPOSITION ULNAR NERVE AT ELBOW  09/2011  . TOTAL ABDOMINAL HYSTERECTOMY     Social History   Social History Narrative  Patient lives at home with daughter.   Caffeine Use: none     Objective: Vital Signs: BP 128/62   Resp 16   Ht 4\' 11"  (1.499 m)   Wt 148 lb (67.1 kg)   BMI 29.89 kg/m    Physical Exam   Musculoskeletal Exam:  Full range of motion of all joints Grip strength is equal and strong bilaterally Fiber myalgia tender points are all absent  CDAI Exam: No CDAI exam completed.  No synovitis on examination except Right fifth MCP with deformity after trauma many years ago no synovitis  Investigation: No additional findings. No visits with results within 6 Month(s) from this visit.  Latest known visit with results is:  Appointment on 09/26/2015  Component Date Value Ref Range Status  .  Angiotensin-Converting Enzyme 09/26/2015 15  8 - 52 U/L Final     Imaging: No results found.  Speciality Comments: No specialty comments available.    Procedures:  No procedures performed Allergies: Fosamax [alendronate sodium]; Omeprazole; Motrin [ibuprofen]; and Fluvastatin   Assessment / Plan:     Visit Diagnoses: Sarcoidosis  Chronic pain of both knees - 12/10/2016: Possible left knee with meniscal tear according to patient after she discussed it with Dr. Mardelle Matte after an MRI was done  Chronic pain of left knee  Age-related osteoporosis without current pathological fracture   Plan: #1: Sarcoidosis. No flare. Doing well. Seeing Dr. Annamaria Boots, pulmonologist annually. Her last visit was June 2018 and everything was normal.  #2: High risk prescription: None.; Uses Voltaren gel when necessary  #3: Left knee pain. Patient saw Dr. Mardelle Matte at [facility] orthopedics. She does not recall the exact conversation, he did offer surgery. She thinks that she may have a meniscal tear based on her recollection of the discussion. She did have an MRI done.  #4: Osteoporosis. Bone density done recently at Dr. Baird Cancer office. Patient wants to bring the bone density results to our office so we can take over treating osteoporosis. She has not tolerated Fosamax well in the past. She does not recall what the current bone density results were that she plans to stop by their office get a copy and bring it to our office.  #5. Return to clinic in 5 months for sarcoidosis visit and we may need an earlier visit to discuss bone density report and treatment options.  Orders: No orders of the defined types were placed in this encounter.  No orders of the defined types were placed in this encounter.   Face-to-face time spent with patient was 30 minutes. 50% of time was spent in counseling and coordination of care.  Follow-Up Instructions: No Follow-up on file.   Eliezer Lofts, PA-C  Note -  This record has been created using [employer].  Chart creation errors have been sought, but may not always  have been located. Such creation errors do not reflect on  the standard of medical care.

## 2016-12-17 ENCOUNTER — Ambulatory Visit: Payer: Medicare Other | Admitting: Rheumatology

## 2017-01-30 DIAGNOSIS — N6001 Solitary cyst of right breast: Secondary | ICD-10-CM

## 2017-01-30 HISTORY — DX: Solitary cyst of right breast: N60.01

## 2017-03-13 HISTORY — PX: KNEE ARTHROPLASTY: SHX992

## 2017-05-02 NOTE — Progress Notes (Deleted)
Office Visit Note  Patient: Mariah Burns             Date of Birth: [birth year]           MRN: [id number]             PCP: Glendale Chard, MD Referring: Glendale Chard, MD Visit Date: 05/14/2017 Occupation: @GUAROCC @    Subjective:  No chief complaint on file.   History of Present Illness: Mariah Burns is a 71 y.o. female ***   Activities of Daily Living:  Patient reports morning stiffness for *** {minute/hour:19697}.   Patient {ACTIONS;DENIES/REPORTS:21021675::"Denies"} nocturnal pain.  Difficulty dressing/grooming: {ACTIONS;DENIES/REPORTS:21021675::"Denies"} Difficulty climbing stairs: {ACTIONS;DENIES/REPORTS:21021675::"Denies"} Difficulty getting out of chair: {ACTIONS;DENIES/REPORTS:21021675::"Denies"} Difficulty using hands for taps, buttons, cutlery, and/or writing: {ACTIONS;DENIES/REPORTS:21021675::"Denies"}   No Rheumatology ROS completed.   PMFS History:  Patient Active Problem List   Diagnosis Date Noted  . Dyspnea on exertion 02/17/2015  . Incomplete right bundle branch block 02/17/2015  . Systolic murmur 27/78/2423  . Pericardial effusion 02/17/2015  . Osteopenia 10/05/2011  . DM (diabetes mellitus), type 2 with complications ([place]) 53/61/4431  . SARCOIDOSIS 03/21/2007  . HYPERLIPIDEMIA 03/21/2007  . Obstructive sleep apnea 03/21/2007  . Essential hypertension 03/21/2007    Past Medical History:  Diagnosis Date  . Hyperlipidemia   . Hypertension   . OSA (obstructive sleep apnea)    NPSG 03/08/2010  AHI 19.8/hr   . Osteopenia   . 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 Type 2 diabetes mellitus (HCC)     Family History  Problem Relation Age of Onset  . Allergies Sister   . Heart disease Sister   . Lupus Sister   . Allergies Mother   . Allergies Daughter   . Healthy Daughter   . Heart disease Sister   . Gout Brother   . Hypertension  Brother   . Heart disease Brother   . Heart failure Father   . Gout Brother   . Hypertension Brother   . Hypertension Brother   . Hypertension Brother   . Post-traumatic stress disorder Brother   . Healthy Daughter    Past Surgical History:  Procedure Laterality Date  . BRONCHOSCOPY     dx'd sarcoid  . NEUROPLASTY / TRANSPOSITION ULNAR NERVE AT ELBOW  09/2011  . TOTAL ABDOMINAL HYSTERECTOMY     Social History   Social History Narrative   Patient lives at home with daughter.   Caffeine Use: none     Objective: Vital Signs: There were no vitals taken for this visit.   Physical Exam   Musculoskeletal Exam: ***  CDAI Exam: No CDAI exam completed.    Investigation: No additional findings.   Imaging: No results found.  Speciality Comments: No specialty comments available.    Procedures:  No procedures performed Allergies: Fosamax [alendronate sodium]; Omeprazole; Motrin [ibuprofen]; and Fluvastatin   Assessment / Plan:     Visit Diagnoses: No diagnosis found.    Orders: No orders of the defined types were placed in this encounter.  No orders of the defined types were placed in this encounter.   Face-to-face time spent with patient was *** minutes. 50% of time was spent in counseling and coordination of care.  Follow-Up Instructions: No Follow-up on file.   Earnestine Mealing, CMA  Note - This record has been created using Editor, commissioning.  Chart creation errors have  been sought, but may not always  have been located. Such creation errors do not reflect on  the standard of medical care.

## 2017-05-14 ENCOUNTER — Ambulatory Visit: Payer: Medicare Other | Admitting: Rheumatology

## 2017-05-16 NOTE — Progress Notes (Signed)
Office Visit Note  Patient: Mariah Burns             Date of Birth: 01/26/1946           MRN: [id number]             PCP: Glendale Chard, MD Referring: Glendale Chard, MD Visit Date: 05/20/2017 Occupation: @GUAROCC @    Subjective:  Bilateral elbow pain    History of Present Illness: Mariah Burns is a 71 y.o. female with history of pulmonary sarcoidosis and osteoarthritis.  Patient states she not had any recent flares of Sarcoidosis.  She denies any shortness of breath.  She has been doing water aerobics and walking frequently.  She states she see Dr. Annamaria Boots once yearly, and will be following up with him around June 2019.   Patient states that since her last visit she had a left knee arthroscopy with Dr. Mardelle Matte on 03/07/17.  She states she has been doing very well and follows up with Dr. Mardelle Matte on 05/22/17.  Denies any mechanical symptoms at this time. She denies any joint pain or swelling.  She continues to have epicondylitis of both elbows.  She has noticed slightly decreased grip strength.     Activities of Daily Living:  Patient reports morning stiffness for 5 minutes.   Patient Reports nocturnal pain.  Difficulty dressing/grooming: Denies Difficulty climbing stairs: Reports Difficulty getting out of chair: Reports Difficulty using hands for taps, buttons, cutlery, and/or writing: Reports   Review of Systems  Constitutional: Positive for fatigue. Negative for weakness.  HENT: Negative for mouth sores, mouth dryness and nose dryness.   Eyes: Negative for redness and dryness.  Respiratory: Negative for cough, hemoptysis, shortness of breath and difficulty breathing.   Cardiovascular: Negative for chest pain, palpitations, hypertension and swelling in legs/feet.  Gastrointestinal: Negative for blood in stool, constipation and diarrhea.  Endocrine: Negative for increased urination.  Genitourinary: Negative for painful urination.  Musculoskeletal: Positive for  arthralgias, joint pain and morning stiffness. Negative for joint swelling, myalgias, muscle weakness, muscle tenderness and myalgias.  Skin: Negative for color change, rash, hair loss, nodules/bumps, redness, skin tightness, ulcers and sensitivity to sunlight.  Allergic/Immunologic: Negative for susceptible to infections.  Neurological: Negative for dizziness, numbness and headaches.  Hematological: Negative for swollen glands.  Psychiatric/Behavioral: Positive for depressed mood and sleep disturbance (Interrupted sleep due to CPAP). The patient is not nervous/anxious.     PMFS History:  Patient Active Problem List   Diagnosis Date Noted  . Dyspnea on exertion 02/17/2015  . Incomplete right bundle branch block 02/17/2015  . Systolic murmur 94/85/4627  . Pericardial effusion 02/17/2015  . Osteopenia 10/05/2011  . DM (diabetes mellitus), type 2 with complications ([place]) 03/50/0938  . SARCOIDOSIS 03/21/2007  . HYPERLIPIDEMIA 03/21/2007  . Obstructive sleep apnea 03/21/2007  . Essential hypertension 03/21/2007    Past Medical History:  Diagnosis Date  . Hyperlipidemia   . Hypertension   . OSA (obstructive sleep apnea)    NPSG 03/08/2010  AHI 19.8/hr   . Osteopenia   . 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 Type 2 diabetes mellitus (HCC)     Family History  Problem Relation Age of Onset  . Allergies Sister   . Heart disease Sister   . Lupus Sister   . Allergies Mother   . Allergies Daughter   . Healthy Daughter   .  Heart disease Sister   . Gout Brother   . Hypertension Brother   . Heart disease Brother   . Heart failure Father   . Gout Brother   . Hypertension Brother   . Hypertension Brother   . Hypertension Brother   . Post-traumatic stress disorder Brother   . Healthy Daughter    Past Surgical History:  Procedure Laterality Date  . BRONCHOSCOPY     dx'd sarcoid    . KNEE ARTHROPLASTY Left 03/13/2017  . NEUROPLASTY / TRANSPOSITION ULNAR NERVE AT ELBOW  09/2011  . TOTAL ABDOMINAL HYSTERECTOMY     Social History   Social History Narrative   Patient lives at home with daughter.   Caffeine Use: none     Objective: Vital Signs: BP 133/60 (BP Location: Left Arm, Patient Position: Sitting, Cuff Size: Normal)   Pulse 61   Resp 16   Ht 4\' 11"  (1.499 m)   Wt 148 lb (67.1 kg)   BMI 29.89 kg/m    Physical Exam  Constitutional: She is oriented to person, place, and time. She appears well-developed and well-nourished.  HENT:  Head: Normocephalic and atraumatic.  Eyes: Conjunctivae and EOM are normal.  Neck: Normal range of motion.  Cardiovascular: Normal rate, regular rhythm, normal heart sounds and intact distal pulses.  Pulmonary/Chest: Effort normal and breath sounds normal.  Abdominal: Soft. Bowel sounds are normal.  Lymphadenopathy:    She has no cervical adenopathy.  Neurological: She is alert and oriented to person, place, and time.  Skin: Skin is warm and dry. Capillary refill takes less than 2 seconds.  Psychiatric: She has a normal mood and affect. Her behavior is normal.  Nursing note and vitals reviewed.    Musculoskeletal Exam: C-spine, thoracic, and lumbar spine good ROM. Shoulder joints, elbow joints, and wrist joints good ROM. Bilateral elbow epicondylitis.  MCPs, PIPs, and DIPs good ROM with no synovitis. DIP synovial thickening.  Hip joints, knee joints, and ankle joints.  MTPs, PIP, and DIP good ROM with no synovitis.    CDAI Exam: No CDAI exam completed.    Investigation: No additional findings.   Imaging: No results found.  Speciality Comments: No specialty comments available.    Procedures:  No procedures performed Allergies: Fosamax [alendronate sodium]; Omeprazole; Motrin [ibuprofen]; and Fluvastatin   Assessment / Plan:     Visit Diagnoses: Pulmonary sarcoidosis ([place]) - +RF, +ANA: Patient is doing well  clinically.  She has had no recent flares or shortness of breath.  She is being seen yearly by Dr. Annamaria Boots.  Her next appointment will be around June 2019.    Primary osteoarthritis of both hands: No synovitis on exam. Discussed joint protection and muscle strengthening.  She uses a "stress ball" frequently at home for muscle strengthening.    Primary osteoarthritis of both knees: Good ROM with no synovitis.  Left knee doing well after arthroscopic surgery with Dr. Mardelle Matte.  She will be following up with him on 05/22/17.  She performs water aerobics and walks several days a week for exercise.     DDD (degenerative disc disease), cervical: Good ROM with no discomfort.    Lateral epicondylitis of both elbows: Continues to have discomfort.  Patient was given a handout for exercises she can perform at home.  Declined brace at this time.  Discussed physical therapy as a treatment option.    Carpal tunnel syndrome, unspecified laterality: Continues to have numbness and tingling in hands. Had surgery on ulnar nerve, which did not  help her symptoms.    Other medical conditions are listed as follows:   History of hypertension  History of vitamin D deficiency: She continues to take 2,000 IU of vitamin D daily.    History of sleep apnea: Uses CPAP machine  History of diabetes mellitus  Osteopenia of multiple sites: Managed by Dr. Baird Cancer.  She is going to continue going to Dr. Baird Cancer for management of osteopenia.    History of hyperlipidemia    Orders: No orders of the defined types were placed in this encounter.  No orders of the defined types were placed in this encounter.     Follow-Up Instructions: Return in about 6 months (around 11/18/2017) for Sarcoidosis, Osteoarthritis. Bo Merino, MD Note - This record has been created using Editor, commissioning.  Chart creation errors have been sought, but may not always  have been located. Such creation errors do not reflect on  the standard of  medical care.

## 2017-05-20 ENCOUNTER — Ambulatory Visit: Payer: Medicare Other | Admitting: Rheumatology

## 2017-05-20 ENCOUNTER — Encounter: Payer: Self-pay | Admitting: Rheumatology

## 2017-05-20 VITALS — BP 133/60 | HR 61 | Resp 16 | Ht 59.0 in | Wt 148.0 lb

## 2017-05-20 DIAGNOSIS — M7712 Lateral epicondylitis, left elbow: Secondary | ICD-10-CM

## 2017-05-20 DIAGNOSIS — Z8669 Personal history of other diseases of the nervous system and sense organs: Secondary | ICD-10-CM

## 2017-05-20 DIAGNOSIS — Z8639 Personal history of other endocrine, nutritional and metabolic disease: Secondary | ICD-10-CM

## 2017-05-20 DIAGNOSIS — G5600 Carpal tunnel syndrome, unspecified upper limb: Secondary | ICD-10-CM

## 2017-05-20 DIAGNOSIS — M5030 Other cervical disc degeneration, unspecified cervical region: Secondary | ICD-10-CM

## 2017-05-20 DIAGNOSIS — M19041 Primary osteoarthritis, right hand: Secondary | ICD-10-CM | POA: Diagnosis not present

## 2017-05-20 DIAGNOSIS — M7711 Lateral epicondylitis, right elbow: Secondary | ICD-10-CM | POA: Diagnosis not present

## 2017-05-20 DIAGNOSIS — M19042 Primary osteoarthritis, left hand: Secondary | ICD-10-CM

## 2017-05-20 DIAGNOSIS — D860 Sarcoidosis of lung: Secondary | ICD-10-CM | POA: Diagnosis not present

## 2017-05-20 DIAGNOSIS — Z8679 Personal history of other diseases of the circulatory system: Secondary | ICD-10-CM

## 2017-05-20 DIAGNOSIS — M170 Bilateral primary osteoarthritis of knee: Secondary | ICD-10-CM

## 2017-05-20 DIAGNOSIS — M8589 Other specified disorders of bone density and structure, multiple sites: Secondary | ICD-10-CM | POA: Diagnosis not present

## 2017-05-20 NOTE — Patient Instructions (Signed)
Tennis Elbow  Tennis Elbow Rehab Ask your health care provider which exercises are safe for you. Do exercises exactly as told by your health care provider and adjust them as directed. It is normal to feel mild stretching, pulling, tightness, or discomfort as you do these exercises, but you should stop right away if you feel sudden pain or your pain gets worse. Do not begin these exercises until told by your health care provider. Stretching and range of motion exercises These exercises warm up your muscles and joints and improve the movement and flexibility of your elbow. These exercises also help to relieve pain, numbness, and tingling. Exercise A: Wrist extensor stretch 1. Extend your left / right elbow with your fingers pointing down. 2. Gently pull the palm of your left / right hand toward you until you feel a gentle stretch on the top of your forearm. 3. To increase the stretch, push your left / right hand toward the outer edge or pinkie side of your forearm. 4. Hold this position for __________ seconds. Repeat __________ times. Complete this exercise __________ times a day. If directed by your health care provider, repeat this stretch except do it with a bent elbow this time. Exercise B: Wrist flexor stretch  1. Extend your left / right elbow and turn your palm upward. 2. Gently pull your left / right palm and fingertips back so your wrist extends and your fingers point more toward the ground. 3. You should feel a gentle stretch on the inside of your forearm. 4. Hold this position for __________ seconds. Repeat __________ times. Complete this exercise __________ times a day. If directed by your health care provider, repeat this stretch except do it with a bent elbow this time. Strengthening exercises These exercises build strength and endurance in your elbow. Endurance is the ability to use your muscles for a long time, even after they get tired. Exercise C: Wrist extensors  1. Sit with  your left / right forearm palm-down and fully supported on a table or countertop. Your elbow should be resting below the height of your shoulder. 2. Let your left / right wrist extend over the edge of the surface. 3. Loosely hold a __________ weight or a piece of rubber exercise band or tubing in your left / right hand. Slowly curl your left / right hand up toward your forearm. If you are using band or tubing, hold the band or tubing in place with your other hand to provide resistance. 4. Hold this position for __________ seconds. 5. Slowly return to the starting position. Repeat __________ times. Complete this exercise __________ times a day. Exercise D: Radial deviators  1. Stand with a __________ weight in your left / righthand. Or, sit while holding a rubber exercise band or tubing with your other arm supported on a table or countertop. Position your hand so your thumb is on top. 2. Raise your hand upward in front of you so your thumb travels toward your forearm, or pull up on the rubber tubing. 3. Hold this position for __________ seconds. 4. Slowly return to the starting position. Repeat __________ times. Complete this exercise __________ times a day. Exercise E: Eccentric wrist extensors 1. Sit with your left / right forearm palm-down and fully supported on a table or countertop. Your elbow should be resting below the height of your shoulder. 2. If told by your health care provider, hold a __________ weight in your hand. 3. Let your left / right wrist extend over  the edge of the surface. 4. Use your other hand to lift up your left / right hand toward your forearm. Keep your forearm on the table. 5. Using only the muscles in your left / right hand, slowly lower your hand back down to the starting position. Repeat __________ times. Complete this exercise __________ times a day. This information is not intended to replace advice given to you by your health care provider. Make sure you  discuss any questions you have with your health care provider. Document Released: 05/21/2005 Document Revised: 01/25/2016 Document Reviewed: 02/17/2015 Elsevier Interactive Patient Education  2018 [employer]. Tennis elbow is puffiness (inflammation) of the outer tendons of your forearm close to your elbow. Your tendons attach your muscles to your bones. Tennis elbow can happen in any sport or job in which you use your elbow too much. It is caused by doing the same motion over and over. Tennis elbow can cause:  Pain and tenderness in your forearm and the outer part of your elbow.  A burning feeling. This runs from your elbow through your arm.  Weak grip in your hands.  Follow these instructions at home: Activity  Rest your elbow and wrist as told by your doctor. Try to avoid any activities that caused the problem until your doctor says that you can do them again.  If a physical therapist teaches you exercises, do all of them as told.  If you lift an object, lift it with your palm facing up. This is easier on your elbow. Lifestyle  If your tennis elbow is caused by sports, check your equipment and make sure that: ? You are using it correctly. ? It fits you well.  If your tennis elbow is caused by work, take breaks often, if you are able. Talk with your manager about doing your work in a way that is safe for you. ? If your tennis elbow is caused by computer use, talk with your manager about any changes that can be made to your work setup. General instructions  If told, apply ice to the painful area: ? Put ice in a plastic bag. ? Place a towel between your skin and the bag. ? Leave the ice on for 20 minutes, 2-3 times per day.  Take medicines only as told by your doctor.  If you were given a brace, wear it as told by your doctor.  Keep all follow-up visits as told by your doctor. This is important. Contact a doctor if:  Your pain does not get better with treatment.  Your  pain gets worse.  You have weakness in your forearm, hand, or fingers.  You cannot feel your forearm, hand, or fingers. This information is not intended to replace advice given to you by your health care provider. Make sure you discuss any questions you have with your health care provider. Document Released: 11/08/2009 Document Revised: 01/19/2016 Document Reviewed: 05/17/2014 Elsevier Interactive Patient Education  [employer].

## 2017-09-25 ENCOUNTER — Encounter: Payer: Self-pay | Admitting: Interventional Cardiology

## 2017-10-10 ENCOUNTER — Encounter: Payer: Self-pay | Admitting: Interventional Cardiology

## 2017-10-10 ENCOUNTER — Ambulatory Visit: Payer: Medicare Other | Admitting: Interventional Cardiology

## 2017-10-10 VITALS — BP 138/66 | HR 59 | Ht 59.0 in | Wt 145.0 lb

## 2017-10-10 DIAGNOSIS — I4510 Unspecified right bundle-branch block: Secondary | ICD-10-CM | POA: Diagnosis not present

## 2017-10-10 DIAGNOSIS — I313 Pericardial effusion (noninflammatory): Secondary | ICD-10-CM | POA: Diagnosis not present

## 2017-10-10 DIAGNOSIS — E118 Type 2 diabetes mellitus with unspecified complications: Secondary | ICD-10-CM | POA: Diagnosis not present

## 2017-10-10 DIAGNOSIS — D869 Sarcoidosis, unspecified: Secondary | ICD-10-CM | POA: Diagnosis not present

## 2017-10-10 DIAGNOSIS — I10 Essential (primary) hypertension: Secondary | ICD-10-CM

## 2017-10-10 DIAGNOSIS — I3139 Other pericardial effusion (noninflammatory): Secondary | ICD-10-CM

## 2017-10-10 NOTE — Patient Instructions (Signed)
Medication Instructions:  Your physician recommends that you continue on your current medications as directed. Please refer to the Current Medication list given to you today.  Labwork: None  Testing/Procedures: None  Follow-Up: Your physician wants you to follow-up in: 1 year with Dr. Tamala Julian or a NP on his team.  You will receive a reminder letter in the mail two months in advance. If you don't receive a letter, please call our office to schedule the follow-up appointment.   Any Other Special Instructions Will Be Listed Below (If Applicable).     If you need a refill on your cardiac medications before your next appointment, please call your pharmacy.

## 2017-10-10 NOTE — Progress Notes (Signed)
Cardiology Office Note    Date:  10/10/2017   ID:  Mariah Burns, DOB 01-02-1946, MRN [id number]  PCP:  Glendale Chard, MD  Cardiologist: Sinclair Grooms, MD   Chief Complaint  Patient presents with  . Hypertension  . Irregular Heart Beat    History of Present Illness:  Mariah Burns is a 72 y.o. female has a hx of Mild LVH, sarcoidosis, mild pulmonary hypertension, hypertension, hyperlipidemia, and diabetes.  Doing well.  Denies cardiac complaints.  Does have dyspnea with exertion.  She has a water aerobics at least 3 times per week.  She tries to walk in her neighborhood.  Knee arthritis limits her mobility.  She denies orthopnea.  No chest pain.  Has occasional palpitations and one occasion 2 years ago that started without provocation while at rest.  It lasted minutes.  She went to an urgent care center and was told she was okay.  By the time she was evaluated in the rapid heart beating had resolved.   Past Medical History:  Diagnosis Date  . Hyperlipidemia   . Hypertension   . OSA (obstructive sleep apnea)    NPSG 03/08/2010  AHI 19.8/hr   . Osteopenia   . 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 Type 2 diabetes mellitus ([place])     Past Surgical History:  Procedure Laterality Date  . BRONCHOSCOPY     dx'd sarcoid  . KNEE ARTHROPLASTY Left 03/13/2017  . NEUROPLASTY / TRANSPOSITION ULNAR NERVE AT ELBOW  09/2011  . TOTAL ABDOMINAL HYSTERECTOMY      Current Medications: Outpatient Medications Prior to Visit  Medication Sig Dispense Refill  . Ascorbic Acid (VITAMIN C PO) Chew one (1) tablet by mouth weekly.    Marland Kitchen aspirin 81 MG tablet Take 81 mg by mouth. Every once in a while.     . Cholecalciferol (VITAMIN D3) 2000 UNITS TABS Take 1 tablet by mouth daily.      . felodipine (PLENDIL) 10 MG 24 hr tablet Take 10 mg by mouth daily.      . fish oil-omega-3 fatty  acids 1000 MG capsule Take 2 capsules by mouth daily.      Marland Kitchen GLUCOSAMINE-CHONDROITIN PO Take 2 tablets by mouth daily.      Marland Kitchen glucose blood (ONETOUCH VERIO) test strip use as directed to check blood sugars 1 time per day dx: e11.65    . L-Methylfolate-Algae-B12-B6 (METANX) 3-90.314-2-35 MG CAPS Take 1 capsule by mouth 2 (two) times daily.     . Liniments (BLUE-EMU SUPER STRENGTH EX) Apply 1 application topically daily as needed (pain).     . Misc Natural Products (TART CHERRY ADVANCED PO) Take 2 tablets by mouth daily.    . nitroGLYCERIN (NITROSTAT) 0.4 MG SL tablet Place 1 tablet (0.4 mg total) under the tongue every 5 (five) minutes as needed for chest pain. 25 tablet 2  . ONETOUCH DELICA LANCETS 51W MISC stick use as directed to check blood sugars 1 time per day dx: e11.65    . Polyvinyl Alcohol-Povidone PF (REFRESH) 1.4-0.6 % SOLN Place 1 drop into both eyes daily as needed (dry eyes).     . Ascorbic Acid (VITAMIN C PO) Take by mouth daily.    . diclofenac sodium (VOLTAREN) 1 % GEL Apply topically 4 (four) times daily.     No facility-administered medications prior to visit.  Allergies:   Fosamax [alendronate sodium]; Omeprazole; Motrin [ibuprofen]; and Fluvastatin   Social History   Socioeconomic History  . Marital status: Divorced    Spouse name: Not on file  . Number of children: 3  . Years of education: College  . Highest education level: Not on file  Occupational History  . Occupation: Retired    Fish farm manager: RETIRED  Social Needs  . Financial resource strain: Not on file  . Food insecurity:    Worry: Not on file    Inability: Not on file  . Transportation needs:    Medical: Not on file    Non-medical: Not on file  Tobacco Use  . Smoking status: Never Smoker  . Smokeless tobacco: Never Used  Substance and Sexual Activity  . Alcohol use: No  . Drug use: No  . Sexual activity: Not on file  Lifestyle  . Physical activity:    Days per week: Not on file    Minutes  per session: Not on file  . Stress: Not on file  Relationships  . Social connections:    Talks on phone: Not on file    Gets together: Not on file    Attends religious service: Not on file    Active member of club or organization: Not on file    Attends meetings of clubs or organizations: Not on file    Relationship status: Not on file  Other Topics Concern  . Not on file  Social History Narrative   Patient lives at home with daughter.   Caffeine Use: none     Family History:  The patient's family history includes Allergies in her daughter, mother, and sister; Gout in her brother and brother; Healthy in her daughter and daughter; Heart disease in her brother, sister, and sister; Heart failure in her father; Hypertension in her brother, brother, brother, and brother; Lupus in her sister; Post-traumatic stress disorder in her brother.   ROS:   Please see the history of present illness.    None  All other systems reviewed and are negative.   PHYSICAL EXAM:   VS:  BP 138/66   Pulse (!) 59   Ht 4\' 11"  (1.499 m)   Wt 145 lb (65.8 kg)   BMI 29.29 kg/m    GEN: Well nourished, well developed, in no acute distress  HEENT: normal  Neck: no JVD, carotid bruits, or masses Cardiac: RRR; no murmurs, rubs, or gallops,no edema  Respiratory:  clear to auscultation bilaterally, normal work of breathing GI: soft, nontender, nondistended, + BS MS: no deformity or atrophy  Skin: warm and dry, no rash Neuro:  Alert and Oriented x 3, Strength and sensation are intact Psych: euthymic mood, full affect  Wt Readings from Last 3 Encounters:  10/10/17 145 lb (65.8 kg)  05/20/17 148 lb (67.1 kg)  12/10/16 148 lb (67.1 kg)      Studies/Labs Reviewed:   EKG:  EKG normal sinus rhythm with right bundle and left axis deviation.  Prominent voltage suggesting LVH.  When compared to prior, no changes noted.  Recent Labs: No results found for requested labs within last 8760 hours.   Lipid Panel No  results found for: CHOL, TRIG, HDL, CHOLHDL, VLDL, LDLCALC, LDLDIRECT  Additional studies/ records that were reviewed today include:  2D echocardiogram September 2016: Impressions:  - Normal LV size with mild LV hypertrophy. EF 65-70%. Normal RV   size and systolic function. No significant valvular   abnormalities. Small pericardial effusion. Mild pulmonary  hypertension.   ASSESSMENT:    1. Pericardial effusion   2. Sarcoidosis   3. Incomplete right bundle branch block   4. Essential hypertension   5. Type 2 diabetes mellitus with complication, without long-term current use of insulin (HCC)      PLAN:  In order of problems listed above:  1. No clinical evidence of pericardial disease. 2. Followed by primary care 3. Unchanged 4. Adequate control.  Discussed target of 130/80 mmHg. 5. Encourage aerobic activity to help with risk factor control.  Encouraged to call us if any prolonged palpitations.  Chest pain and shortness of breath would also be a concern especially if associated with edema.  Clinical cardiovascular follow-up in 1 year with me or an APP for risk factor reassessment.  Medication Adjustments/Labs and Tests Ordered: Current medicines are reviewed at length with the patient today.  Concerns regarding medicines are outlined above.  Medication changes, Labs and Tests ordered today are listed in the Patient Instructions below. Patient Instructions  Medication Instructions:  Your physician recommends that you continue on your current medications as directed. Please refer to the Current Medication list given to you today.  Labwork: None  Testing/Procedures: None  Follow-Up: Your physician wants you to follow-up in: 1 year with Dr. Tamala Julian or a NP on his team.  You will receive a reminder letter in the mail two months in advance. If you don't receive a letter, please call our office to schedule the follow-up appointment.   Any Other Special Instructions Will Be  Listed Below (If Applicable).     If you need a refill on your cardiac medications before your next appointment, please call your pharmacy.      Signed, Sinclair Grooms, MD  10/10/2017 11:27 AM    [address], [place], [place]  [address] Phone: [phone]; Fax: [phone]

## 2017-11-21 ENCOUNTER — Encounter: Payer: Self-pay | Admitting: Internal Medicine

## 2017-11-22 ENCOUNTER — Telehealth: Payer: Self-pay | Admitting: Internal Medicine

## 2017-11-22 ENCOUNTER — Encounter: Payer: Self-pay | Admitting: Internal Medicine

## 2017-11-22 ENCOUNTER — Ambulatory Visit: Payer: Medicare Other | Admitting: Internal Medicine

## 2017-11-22 VITALS — BP 122/78 | HR 70 | Ht 59.0 in | Wt 149.4 lb

## 2017-11-22 DIAGNOSIS — G4733 Obstructive sleep apnea (adult) (pediatric): Secondary | ICD-10-CM

## 2017-11-22 DIAGNOSIS — D869 Sarcoidosis, unspecified: Secondary | ICD-10-CM | POA: Diagnosis not present

## 2017-11-22 MED ORDER — ZALEPLON 5 MG PO CAPS: 5.0000 mg | ORAL_CAPSULE | Freq: Every evening | ORAL | 5 refills | Status: DC | PRN

## 2017-11-22 NOTE — Progress Notes (Signed)
Subjective:    Patient ID: Mariah Burns, female    DOB: [birth year], 72 y.o.   MRN: [id number]  HPI female never smoker followed for OSA, Sarcoid, complicated by HBP, DM NPSG 03/08/2010  AHI 19.8/hr  Office spirometry 09/20/15- WNL-FEV1/FVC 0.85 ------------------------------------------------------------------------------------------ 11/22/16- 72 year old female never smoker followed for OSA, Sarcoid, complicated by HBP, DM CPAP 7/[facility] OSA follow up for DME is CHOICE patient sleeps for about 5 hours a night  patient states that her CPAP is working well for her.  She denies new issues or concerns. Download confirms 77% 4 hour compliance, AHI 7.7/hour. She says she feels a little groggy in the morning and wonder what her oxygen levels were like. We had reduced CPAP pressure originally from 11, finally down to 7 for comfort. CXR 09/20/15 Chronic interstitial thickening/prominence, likely residua of prior sarcoidosis. No new opacity. No adenopathy. Stable cardiac silhouette.  11/22/17- 72 year old female never smoker followed for OSA, Sarcoid, complicated by HBP, DM CPAP 7/[facility] -----Sarcoidosis and OSA: DME [facility]. Pt states her breathing is doing well and wears CPAP nightly-DL attached and will need order for new supplies.  Download compliance 90%, AHI 3.7/ hr.  Sleeps better with CPAP but cites insomnia, waking 2AM with some difficulty getting back to sleep, which leaves her tired in the AM. Denies significant cough, rash, nightsweats or adenopathy to suggest active sarcoid.  Review of Systems-see HPI  + = positive Constitutional:   No-   weight loss, night sweats, fevers, chills, +fatigue, lassitude. HEENT:   No-  headaches, difficulty swallowing, tooth/dental problems, sore throat,       No-  sneezing, itching, ear ache, nasal congestion, post nasal drip,  CV:  No-   chest pain, orthopnea, PND, swelling in lower extremities, anasarca, dizziness,  palpitations Resp: +shortness of breath with exertion or at rest.              No-   productive cough,  No non-productive cough,  No- coughing up of blood.              No-   change in color of mucus.  No- wheezing.   Skin: No-   rash or lesions. GI:  No-   heartburn, indigestion, abdominal pain, nausea, vomiting,  GU:  MS:  No-   joint pain or swelling.   Neuro-     nothing unusual Psych:  No- change in mood or affect. No depression or anxiety.  No memory loss.  Objective:   Physical Exam   Stable baseline exam General- Alert, Oriented, Affect-appropriate, Distress- none acute; medium build Skin- rash-none, lesions- none, excoriation- none Lymphadenopathy- none Head- atraumatic            Eyes- Gross vision intact, PERRLA, conjunctivae clear secretions            Ears- Hearing, canals-normal            Nose- Clear, no-Septal dev, mucus, polyps, erosion, perforation             Throat- Mallampati III , mucosa- not very dry , drainage- none, tonsils- atrophic Neck- flexible , trachea midline, no stridor , thyroid nl, carotid no bruit Chest - symmetrical excursion , unlabored           Heart/CV- RRR ,  Murmur+Trace systolic AS , no gallop  , no rub, nl s1 s2                           -  JVD- none , edema- none, stasis changes- none, varices- none           Lung- clear to P&A, wheeze- none, cough- none , dullness-none, rub- none           Chest wall-  Abd-  Br/ Gen/ Rectal- Not done, not indicated Extrem- cyanosis- none, clubbing, none, atrophy- none, strength- nl Neuro- grossly intact to observation

## 2017-11-22 NOTE — Patient Instructions (Signed)
I suggest a nap and simple comfort measures like fluids and throat lozenges today, otc cold remedies if needed.  Order- DME [facility]- continue CPAP 7, mask of choice, humidifier, supplies, AirView                              She needs replacement mask and supplies now please  Please call if we can help

## 2017-11-22 NOTE — Progress Notes (Signed)
Office Visit Note  Patient: Mariah Burns             Date of Birth: [birth year]           MRN: [id number]             PCP: Glendale Chard, MD Referring: Glendale Chard, MD Visit Date: 11/28/2017 Occupation: @GUAROCC @    Subjective:  Pain in multiple joints   History of Present Illness: Mariah Burns is a 72 y.o. female with history of pulmonary sarcoidosis, osteoarthritis, and DDD.  She reports she has occasional discomfort in bilateral knee joints and feet.  She occasionally has discomfort in the lower back.  She will intermittently have bilateral trochanteric bursitis.  Her right shoulder has been causing more discomfort with ROM.   She denies any shortness of breath or pulmonary symptoms.  She had a follow up visit with Dr. Annamaria Boots on 11/22/17.  Per patient everything has been stable.     Activities of Daily Living:  Patient reports morning stiffness for 2-5 minutes.   Patient Reports nocturnal pain.  Difficulty dressing/grooming: Denies Difficulty climbing stairs: Denies Difficulty getting out of chair: Reports Difficulty using hands for taps, buttons, cutlery, and/or writing: Denies   Review of Systems  Constitutional: Positive for fatigue. Negative for fever.  HENT: Negative for mouth sores, mouth dryness and nose dryness.   Eyes: Negative for pain, visual disturbance and dryness.  Respiratory: Negative for cough, hemoptysis, shortness of breath and difficulty breathing.   Cardiovascular: Negative for chest pain, palpitations, hypertension and swelling in legs/feet.  Gastrointestinal: Negative for blood in stool, constipation and diarrhea.  Endocrine: Negative for increased urination.  Genitourinary: Negative for difficulty urinating and painful urination.  Musculoskeletal: Positive for arthralgias, joint pain, myalgias, muscle weakness, morning stiffness and myalgias. Negative for joint swelling and muscle tenderness.  Skin: Negative for color change, pallor,  rash, hair loss, nodules/bumps, skin tightness, ulcers and sensitivity to sunlight.  Allergic/Immunologic: Negative for susceptible to infections.  Neurological: Negative for dizziness and headaches.  Hematological: Negative for bruising/bleeding tendency and swollen glands.  Psychiatric/Behavioral: Positive for depressed mood and sleep disturbance. The patient is not nervous/anxious.     PMFS History:  Patient Active Problem List   Diagnosis Date Noted  . Dyspnea on exertion 02/17/2015  . Incomplete right bundle branch block 02/17/2015  . Systolic murmur 62/83/1517  . Pericardial effusion 02/17/2015  . Osteopenia 10/05/2011  . DM (diabetes mellitus), type 2 with complications ([place]) 61/60/7371  . SARCOIDOSIS 03/21/2007  . HYPERLIPIDEMIA 03/21/2007  . Obstructive sleep apnea 03/21/2007  . Essential hypertension 03/21/2007    Past Medical History:  Diagnosis Date  . Hyperlipidemia   . Hypertension   . OSA (obstructive sleep apnea)    NPSG 03/08/2010  AHI 19.8/hr   . Osteopenia   . 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 Type 2 diabetes mellitus (HCC)     Family History  Problem Relation Age of Onset  . Allergies Sister   . Heart disease Sister   . Lupus Sister   . Allergies Mother   . Allergies Daughter   . Healthy Daughter   . Heart disease Sister   . Gout Brother   . Hypertension Brother   . Heart disease Brother   . Heart failure Father   . Gout Brother   . Hypertension Brother   . Hypertension Brother   .  Hypertension Brother   . Post-traumatic stress disorder Brother   . Healthy Daughter    Past Surgical History:  Procedure Laterality Date  . BRONCHOSCOPY     dx'd sarcoid  . KNEE ARTHROPLASTY Left 03/13/2017  . NEUROPLASTY / TRANSPOSITION ULNAR NERVE AT ELBOW  09/2011  . TOTAL ABDOMINAL HYSTERECTOMY     Social History   Social History Narrative    Patient lives at home with daughter.   Caffeine Use: none     Objective: Vital Signs: BP (!) 153/93 (BP Location: Left Arm, Patient Position: Sitting, Cuff Size: Normal)   Pulse (!) 59   Ht 4\' 11"  (1.499 m)   Wt 148 lb (67.1 kg)   BMI 29.89 kg/m    Physical Exam  Constitutional: She is oriented to person, place, and time. She appears well-developed and well-nourished.  HENT:  Head: Normocephalic and atraumatic.  Eyes: Conjunctivae and EOM are normal.  Neck: Normal range of motion.  Cardiovascular: Normal rate, regular rhythm, normal heart sounds and intact distal pulses.  Pulmonary/Chest: Effort normal and breath sounds normal.  Abdominal: Soft. Bowel sounds are normal.  Lymphadenopathy:    She has no cervical adenopathy.  Neurological: She is alert and oriented to person, place, and time.  Skin: Skin is warm and dry. Capillary refill takes less than 2 seconds.  Psychiatric: She has a normal mood and affect. Her behavior is normal.  Nursing note and vitals reviewed.   Musculoskeletal Exam: Slightly limited ROM of C-spine.  Thoracic and lumbar spine good ROM. She has tenderness in the sacral region. Right shoulder joint discomfort with ROM. Left shoulder full ROM with no discomfort. Elbow joints, wrist joints, MCPs, PIPs, and DIPs good ROM with no synovitis.  Hip joints, knee joints, ankle joints, MTPs, PIPs, and DIPs good ROM with no synovitis.  No warmth or effusion of knee joints.  No tenderness of trochanteric bursa bilaterally.   CDAI Exam: No CDAI exam completed.    Investigation: No additional findings.   Imaging: No results found.  Speciality Comments: No specialty comments available.    Procedures:  No procedures performed Allergies: Fosamax [alendronate sodium]; Omeprazole; Motrin [ibuprofen]; and Fluvastatin   Assessment / Plan:     Visit Diagnoses: Pulmonary sarcoidosis ([place]) - +RF, +ANA: She is clinically doing well.  She has not had any recurrences of  shortness of breath recently.  Her last visit with Dr. Annamaria Boots was on 11/22/17.  According to Dr. Janee Morn note, sarcoidosis is in long-term remission and reactivation is unlikely.  She was advised to follow up with Dr. Annamaria Boots if she develops any new pulmonary symptoms.    Primary osteoarthritis of both hands: She has no synovitis.  She has no discomfort in her hands at this time.  Joint protection and muscle strengthening were discussed.   Primary osteoarthritis of both knees: No warmth or effusion of bilateral knee joints.  She has good range of motion on exam.  She has been having occasional discomfort in the knee joint especially kneeling or climbing up steps.  DDD (degenerative disc disease), cervical: She has had a limited range of motion of her C-spine.  No symptoms of radiculopathy at this time.  She has no discomfort in her neck at this time.  Carpal tunnel syndrome, unspecified laterality: She continues to have intermittent numbness in bilateral hands.   Lateral epicondylitis of both elbows: She has no tenderness on exam today.  Osteopenia of multiple sites - Managed by Dr. Baird Cancer.  She is going  to continue going to Dr. Baird Cancer for management of osteopenia.    Sacral pain: She has been having increased sacral back pain.  We discussed exercises she can perform at home.  We also discussed limiting how long she sits during the day.  She should also try using a cushion if she is sitting for prolonged periods of time.   Other medical conditions are listed as follows:   History of hyperlipidemia  History of hypertension  History of sleep apnea - Uses CPAP machine  History of diabetes mellitus  History of vitamin D deficiency    Orders: No orders of the defined types were placed in this encounter.  No orders of the defined types were placed in this encounter.  Face-to-face time spent patient was 30 minutes.  Greater than 50% time was spent counseling and coordination of  care.   Follow-Up Instructions: Return in about 6 months (around 05/30/2018) for Pulmonary sarcoidosis, Osteoarthritis, DDD.   Ofilia Neas, PA-C I examined and evaluated the patient with Hazel Sams PA.  Patient complains of multiple arthralgias.  She had no synovitis on examination.  She has been experiencing a lot of sacral discomfort and had tenderness on palpation over her coccyx and sacral region on my exam.  Some exercises were demonstrated in the office.  The plan of care was discussed as noted above.  Bo Merino, MD Note - This record has been created using Editor, commissioning.  Chart creation errors have been sought, but may not always  have been located. Such creation errors do not reflect on  the standard of medical care.

## 2017-11-22 NOTE — Telephone Encounter (Signed)
Received a fax from the pharmacy for [place]. I have called [employer] at [phone] and have initiated a PA.#[id number]. I will follow up on this.

## 2017-11-25 NOTE — Assessment & Plan Note (Signed)
She continues to benefit from CPAP with better sleep. Educated on good sleep habits and discussed insomnia.  She would need something with short half-life because she is waking at 2 AM. Plan-continue CPAP 7.  Try Sonata 5 mg with discussion good sleep hygiene for insomnia component.

## 2017-11-25 NOTE — Assessment & Plan Note (Signed)
Early in long-term remission.  Reactivation is unlikely as discussed.

## 2017-11-26 NOTE — Telephone Encounter (Signed)
rec'd documents from [place] regarding medication of Zaleplon is denied. Provided these documents to [facility] and placed in CY cubby  Routed message to Katie to follow up on with patient.

## 2017-11-27 NOTE — Telephone Encounter (Signed)
  Per CMM- We are unable to process your request for prior authorization for ZALEPLON CAP 5MG  for the above member due to [employer] has a denied request on file for ZALEPLON CAP 5MG  for this member. Please refer to the appeals process outlined in the original denial or contact Prior Authorization Department at [phone] for further questions.  Will route to Katie to follow up

## 2017-11-28 ENCOUNTER — Ambulatory Visit: Payer: Medicare Other | Admitting: Rheumatology

## 2017-11-28 ENCOUNTER — Encounter: Payer: Self-pay | Admitting: Rheumatology

## 2017-11-28 VITALS — BP 153/93 | HR 59 | Ht 59.0 in | Wt 148.0 lb

## 2017-11-28 DIAGNOSIS — M7711 Lateral epicondylitis, right elbow: Secondary | ICD-10-CM | POA: Diagnosis not present

## 2017-11-28 DIAGNOSIS — M533 Sacrococcygeal disorders, not elsewhere classified: Secondary | ICD-10-CM | POA: Diagnosis not present

## 2017-11-28 DIAGNOSIS — M170 Bilateral primary osteoarthritis of knee: Secondary | ICD-10-CM | POA: Diagnosis not present

## 2017-11-28 DIAGNOSIS — Z8639 Personal history of other endocrine, nutritional and metabolic disease: Secondary | ICD-10-CM

## 2017-11-28 DIAGNOSIS — M5030 Other cervical disc degeneration, unspecified cervical region: Secondary | ICD-10-CM | POA: Diagnosis not present

## 2017-11-28 DIAGNOSIS — M7712 Lateral epicondylitis, left elbow: Secondary | ICD-10-CM

## 2017-11-28 DIAGNOSIS — Z8669 Personal history of other diseases of the nervous system and sense organs: Secondary | ICD-10-CM | POA: Diagnosis not present

## 2017-11-28 DIAGNOSIS — G5600 Carpal tunnel syndrome, unspecified upper limb: Secondary | ICD-10-CM | POA: Diagnosis not present

## 2017-11-28 DIAGNOSIS — M8589 Other specified disorders of bone density and structure, multiple sites: Secondary | ICD-10-CM | POA: Diagnosis not present

## 2017-11-28 DIAGNOSIS — M19042 Primary osteoarthritis, left hand: Secondary | ICD-10-CM | POA: Diagnosis not present

## 2017-11-28 DIAGNOSIS — M19041 Primary osteoarthritis, right hand: Secondary | ICD-10-CM | POA: Diagnosis not present

## 2017-11-28 DIAGNOSIS — D860 Sarcoidosis of lung: Secondary | ICD-10-CM

## 2017-11-28 DIAGNOSIS — Z8679 Personal history of other diseases of the circulatory system: Secondary | ICD-10-CM

## 2018-01-28 ENCOUNTER — Telehealth: Payer: Self-pay | Admitting: Internal Medicine

## 2018-01-28 NOTE — Telephone Encounter (Signed)
PA started on CMM KEY: [id number]  PA in process and will take up to 72 hours for decision.

## 2018-01-29 NOTE — Telephone Encounter (Signed)
Called patient, informed patient that [employer] was denied via her insurance and MD Young recommends [employer]. Patient voiced understanding and was okay with changing to [place]. Belsomra called into [facility] at [address]. Nothing further needed at this time.

## 2018-01-29 NOTE — Telephone Encounter (Signed)
Attempted to call pt. I did not receive an answer. I have left a message for pt to return our call.   Will route message to [facility] for follow up.

## 2018-01-29 NOTE — Telephone Encounter (Signed)
Please tell her insurance will not cover the Sonata I suggested, and wants her to use Belsomra.  Offer Rx Belsomra 15 mg, # 30, 1 at bedtime for sleep.

## 2018-01-29 NOTE — Telephone Encounter (Signed)
Pt is calling back [phone]

## 2018-01-29 NOTE — Telephone Encounter (Signed)
Received a denial via fax from the pt's insurance company. Pt must try and fail Belsomra and [place].  CY - please advise. Thanks.

## 2018-02-26 ENCOUNTER — Telehealth: Payer: Self-pay | Admitting: Internal Medicine

## 2018-02-26 NOTE — Telephone Encounter (Signed)
PA initiated for Zaleplon 5mg  Capsules today 02/26/2018 Key: [id number] - PA Case ID: [id number] - Rx #: [id number]  Additional Information Required We are unable to process your request for prior authorization for ZALEPLON CAP 5MG  for the above member due to [facility] has a denied request on file for ZALEPLON CAP 5MG  for this member. Please refer to the appeals process outlined in the original denial or contact Prior Authorization Department at [phone] for further questions.  Routing to [employer] to f/u with patient and PA process for an appeal.

## 2018-02-28 NOTE — Telephone Encounter (Signed)
Spoke with Joellen Jersey regarding this medication fo rZaleplon 5mg . Pt is aware she has to pay out of pocket for medications. Nothing further needed.

## 2018-04-06 ENCOUNTER — Other Ambulatory Visit: Payer: Self-pay | Admitting: Internal Medicine

## 2018-05-15 ENCOUNTER — Encounter: Payer: Self-pay | Admitting: Internal Medicine

## 2018-05-15 ENCOUNTER — Ambulatory Visit: Payer: Medicare Other | Admitting: Internal Medicine

## 2018-05-15 VITALS — BP 132/80 | HR 55 | Temp 98.1°F | Ht 59.0 in | Wt 146.8 lb

## 2018-05-15 DIAGNOSIS — F430 Acute stress reaction: Secondary | ICD-10-CM

## 2018-05-15 DIAGNOSIS — M816 Localized osteoporosis [Lequesne]: Secondary | ICD-10-CM

## 2018-05-15 DIAGNOSIS — M545 Low back pain, unspecified: Secondary | ICD-10-CM

## 2018-05-15 DIAGNOSIS — N182 Chronic kidney disease, stage 2 (mild): Secondary | ICD-10-CM | POA: Insufficient documentation

## 2018-05-15 DIAGNOSIS — I129 Hypertensive chronic kidney disease with stage 1 through stage 4 chronic kidney disease, or unspecified chronic kidney disease: Secondary | ICD-10-CM | POA: Diagnosis not present

## 2018-05-15 DIAGNOSIS — G4733 Obstructive sleep apnea (adult) (pediatric): Secondary | ICD-10-CM | POA: Diagnosis not present

## 2018-05-15 DIAGNOSIS — Z6829 Body mass index (BMI) 29.0-29.9, adult: Secondary | ICD-10-CM | POA: Insufficient documentation

## 2018-05-15 DIAGNOSIS — E1122 Type 2 diabetes mellitus with diabetic chronic kidney disease: Secondary | ICD-10-CM | POA: Diagnosis not present

## 2018-05-15 DIAGNOSIS — G8929 Other chronic pain: Secondary | ICD-10-CM

## 2018-05-15 HISTORY — DX: Hypertensive chronic kidney disease with stage 1 through stage 4 chronic kidney disease, or unspecified chronic kidney disease: I12.9

## 2018-05-15 HISTORY — DX: Chronic kidney disease, stage 2 (mild): N18.2

## 2018-05-15 LAB — POCT URINALYSIS DIPSTICK
Bilirubin, UA: NEGATIVE
Glucose, UA: NEGATIVE
KETONES UA: NEGATIVE
Nitrite, UA: NEGATIVE
PROTEIN UA: NEGATIVE
RBC UA: NEGATIVE
SPEC GRAV UA: 1.02 (ref 1.010–1.025)
Urobilinogen, UA: 0.2 E.U./dL
pH, UA: 6 (ref 5.0–8.0)

## 2018-05-15 NOTE — Patient Instructions (Signed)

## 2018-05-16 LAB — CMP14+EGFR
A/G RATIO: 1.7 (ref 1.2–2.2)
ALK PHOS: 73 IU/L (ref 39–117)
ALT: 16 IU/L (ref 0–32)
AST: 21 IU/L (ref 0–40)
Albumin: 4.9 g/dL — ABNORMAL HIGH (ref 3.5–4.8)
BILIRUBIN TOTAL: 0.5 mg/dL (ref 0.0–1.2)
BUN/Creatinine Ratio: 14 (ref 12–28)
BUN: 14 mg/dL (ref 8–27)
CHLORIDE: 108 mmol/L — AB (ref 96–106)
CO2: 23 mmol/L (ref 20–29)
Calcium: 9.5 mg/dL (ref 8.7–10.3)
Creatinine, Ser: 0.97 mg/dL (ref 0.57–1.00)
GFR calc Af Amer: 67 mL/min/{1.73_m2} (ref >59)
GFR calc non Af Amer: 58 mL/min/{1.73_m2} — ABNORMAL LOW (ref >59)
GLOBULIN, TOTAL: 2.9 g/dL (ref 1.5–4.5)
Glucose: 95 mg/dL (ref 65–99)
POTASSIUM: 3.6 mmol/L (ref 3.5–5.2)
Sodium: 149 mmol/L — ABNORMAL HIGH (ref 134–144)
Total Protein: 7.8 g/dL (ref 6.0–8.5)

## 2018-05-16 LAB — LIPID PANEL
CHOLESTEROL TOTAL: 216 mg/dL — AB (ref 100–199)
Chol/HDL Ratio: 3.4 ratio (ref 0.0–4.4)
HDL: 64 mg/dL (ref >39)
LDL CALC: 138 mg/dL — AB (ref 0–99)
TRIGLYCERIDES: 72 mg/dL (ref 0–149)
VLDL CHOLESTEROL CAL: 14 mg/dL (ref 5–40)

## 2018-05-16 LAB — HEMOGLOBIN A1C
Est. average glucose Bld gHb Est-mCnc: 126 mg/dL
Hgb A1c MFr Bld: 6 % — ABNORMAL HIGH (ref 4.8–5.6)

## 2018-05-17 ENCOUNTER — Encounter: Payer: Self-pay | Admitting: Internal Medicine
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
## 2018-05-17 NOTE — Progress Notes (Signed)
Your hba1c is 6.0, this is great. Your LDL, bad chol is 138. Ideally, this should be less than 70 as a diabetic. Are you willing to take a cholesterol medication? Have you not tolerated them in the past? Your sodium level is elevated, this implies that you are not drinking enough water. This is important to help preserve your kidney fxn. Pls make necessary changes.

## 2018-05-19 ENCOUNTER — Telehealth: Payer: Self-pay | Admitting: Internal Medicine

## 2018-05-19 NOTE — Telephone Encounter (Signed)
Medication name and strength: Zaleplon 5mg  capsule Provider: Dr. Annamaria Boots  Was the PA started on CMM?  Yes If yes, please enter the Key: [id number] Time frame for approval/denial: Time frame was not given  Will route to [facility] for follow up.

## 2018-05-21 ENCOUNTER — Telehealth: Payer: Self-pay

## 2018-05-21 MED ORDER — TRAZODONE HCL 50 MG PO TABS: 50.0000 mg | ORAL_TABLET | Freq: Every day | ORAL | 5 refills | Status: DC

## 2018-05-21 NOTE — Telephone Encounter (Signed)
The Zaleplon has been denied Dr. Annamaria Boots has advised to try trazodone 50 mg qhs. Patient is aware nothing further needed at this time.

## 2018-05-21 NOTE — Telephone Encounter (Signed)
Left the pt a message to call back for her lab results. 

## 2018-05-21 NOTE — Telephone Encounter (Signed)
-----   Message from Glendale Chard, MD sent at 05/17/2018  6:20 AM EST ----- Your hba1c is 6.0, this is great. Your LDL, bad chol is 138. Ideally, this should be less than 70 as a diabetic. Are you willing to take a cholesterol medication? Have you not tolerated them in the past? Your sodium level is elevated, this implies that you are not drinking enough water. This is important to help preserve your kidney fxn. Pls make necessary changes.

## 2018-05-21 NOTE — Progress Notes (Signed)
pls call in pravastatin 40mg  take MWF only. #30/ 1 refill. Schedule for chol check with me in six weeks.

## 2018-05-22 ENCOUNTER — Other Ambulatory Visit: Payer: Self-pay

## 2018-05-22 ENCOUNTER — Telehealth: Payer: Self-pay

## 2018-05-22 MED ORDER — PRAVASTATIN SODIUM 40 MG PO TABS: ORAL_TABLET | ORAL | 1 refills | Status: DC

## 2018-05-22 NOTE — Telephone Encounter (Signed)
-----   Message from Glendale Chard, MD sent at 05/21/2018  6:12 PM EST ----- pls call in pravastatin 40mg  take MWF only. #30/ 1 refill. Schedule for chol check with me in six weeks.

## 2018-05-22 NOTE — Telephone Encounter (Signed)
Left the pt a message to call back so that I can let her know pravastatin has been sent to the pharmacy for her to take Mon, Wednes, Fri and that she needs a f/u appt with Dr. Baird Cancer in 6 weeks.

## 2018-05-23 ENCOUNTER — Telehealth: Payer: Self-pay

## 2018-05-23 ENCOUNTER — Other Ambulatory Visit: Payer: Self-pay

## 2018-05-23 DIAGNOSIS — Z1159 Encounter for screening for other viral diseases: Secondary | ICD-10-CM

## 2018-05-23 NOTE — Telephone Encounter (Signed)
Pt notified of pravastatin sent to the pharmacy.  Follow-up appt scheduled and the pt was also notified that the office needed her to return to the lab for lab work.

## 2018-05-23 NOTE — Telephone Encounter (Signed)
-----   Message from Glendale Chard, MD sent at 05/17/2018  9:42 AM EST ----- Pls add on hepatitis c ab to her labs

## 2018-05-24 LAB — HEPATITIS C ANTIBODY: Hep C Virus Ab: <0.1 s/co ratio (ref 0.0–0.9)

## 2018-05-30 NOTE — Progress Notes (Signed)
Office Visit Note  Patient: Mariah Burns             Date of Birth: 1945-11-09           MRN: [id number]             PCP: Glendale Chard, MD Referring: Glendale Chard, MD Visit Date: 06/12/2018 Occupation: @GUAROCC @  Subjective:  Lower back pain   History of Present Illness: Mariah Burns is a 72 y.o. female with history of pulmonary sarcoidosis, osteoarthritis, and DDD.  She reports she has been having increased lower back pain recently.  She was evaluated by Dr. Baird Cancer who recommended starting physical therapy which has been helping.  She states her lower back pain wakes her up at night occasionally.  She denies any radiating pain or numbness.  She denies any lower extremity muscle weakness.   She states she has pain in the left knee joint, right thumb, and bilateral shoulder joints. She denies any joint swelling.  She denies any flares of pulmonary sarcoidosis.  She has occasional SOB with exertion but denies any coughing.  She is following up with Dr. Annamaria Boots on 11/24/18.   Activities of Daily Living:  Patient reports morning stiffness for 5  minutes.   Patient Reports nocturnal pain.  Difficulty dressing/grooming: Denies Difficulty climbing stairs: Denies Difficulty getting out of chair: Reports Difficulty using hands for taps, buttons, cutlery, and/or writing: Denies  Review of Systems  Constitutional: Positive for fatigue.  HENT: Positive for mouth dryness. Negative for mouth sores and nose dryness.   Eyes: Positive for dryness. Negative for pain and visual disturbance.  Respiratory: Negative for cough, hemoptysis, shortness of breath and difficulty breathing.   Cardiovascular: Negative for chest pain, palpitations, hypertension and swelling in legs/feet.  Gastrointestinal: Negative for blood in stool, constipation and diarrhea.  Endocrine: Negative for increased urination.  Genitourinary: Negative for painful urination.  Musculoskeletal: Positive for arthralgias  and joint pain. Negative for joint swelling, myalgias, muscle weakness, morning stiffness, muscle tenderness and myalgias.  Skin: Negative for color change, pallor, rash, hair loss, nodules/bumps, skin tightness, ulcers and sensitivity to sunlight.  Allergic/Immunologic: Negative for susceptible to infections.  Neurological: Negative for dizziness, numbness, headaches and weakness.  Hematological: Negative for swollen glands.  Psychiatric/Behavioral: Positive for depressed mood and sleep disturbance. The patient is nervous/anxious.     PMFS History:  Patient Active Problem List   Diagnosis Date Noted  . Chronic renal disease, stage II 05/15/2018  . Hypertensive nephropathy 05/15/2018  . Adult BMI 29.0-29.9 kg/sq m 05/15/2018  . Dyspnea on exertion 02/17/2015  . Incomplete right bundle branch block 02/17/2015  . Systolic murmur 93/71/6967  . Pericardial effusion 02/17/2015  . Osteopenia 10/05/2011  . DM (diabetes mellitus), type 2 with complications ([place]) 89/38/1017  . SARCOIDOSIS 03/21/2007  . HYPERLIPIDEMIA 03/21/2007  . Obstructive sleep apnea 03/21/2007  . Essential hypertension 03/21/2007    Past Medical History:  Diagnosis Date  . Hyperlipidemia   . Hypertension   . OSA (obstructive sleep apnea)    NPSG 03/08/2010  AHI 19.8/hr   . Osteopenia   . 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 Type 2 diabetes mellitus (HCC)     Family History  Problem Relation Age of Onset  . Allergies Sister   . Heart disease Sister   . Lupus Sister   . Allergies Mother   .  Allergies Daughter   . Healthy Daughter   . Heart disease Sister   . Gout Brother   . Hypertension Brother   . Heart disease Brother   . Heart failure Father   . Gout Brother   . Hypertension Brother   . Hypertension Brother   . Hypertension Brother   . Post-traumatic stress disorder Brother   . Healthy Daughter      Past Surgical History:  Procedure Laterality Date  . BRONCHOSCOPY     dx'd sarcoid  . KNEE ARTHROPLASTY Left 03/13/2017  . NEUROPLASTY / TRANSPOSITION ULNAR NERVE AT ELBOW  09/2011  . TOTAL ABDOMINAL HYSTERECTOMY     Social History   Social History Narrative   Patient lives at home with daughter.   Caffeine Use: none    Objective: Vital Signs: BP 132/67 (BP Location: Left Arm, Patient Position: Sitting, Cuff Size: Small)   Pulse (!) 57   Resp 12   Ht 4\' 11"  (1.499 m)   Wt 151 lb (68.5 kg)   BMI 30.50 kg/m    Physical Exam Vitals signs and nursing note reviewed.  Constitutional:      Appearance: She is well-developed.  HENT:     Head: Normocephalic and atraumatic.  Eyes:     Conjunctiva/sclera: Conjunctivae normal.  Neck:     Musculoskeletal: Normal range of motion.  Cardiovascular:     Rate and Rhythm: Normal rate and regular rhythm.     Heart sounds: Normal heart sounds.  Pulmonary:     Effort: Pulmonary effort is normal.     Breath sounds: Normal breath sounds.  Abdominal:     General: Bowel sounds are normal.     Palpations: Abdomen is soft.  Lymphadenopathy:     Cervical: No cervical adenopathy.  Skin:    General: Skin is warm and dry.     Capillary Refill: Capillary refill takes less than 2 seconds.  Neurological:     Mental Status: She is alert and oriented to person, place, and time.  Psychiatric:        Behavior: Behavior normal.      Musculoskeletal Exam: C-spine good ROM. Limited ROM of lumbar spine with discomfort. Midline spinal tenderness in lumbar region.  No SI joint tenderness. Shoulder joints, elbow joints, wrist joints, MCPs, PIPs, and DIPs good ROM with no synovitis.  Complete fist formation bilaterally. Mild PIP and DIP synovial thickening.  Synovial thickening of bilateral CMC joints.  Hip joints, knee joints, ankle joints, MTPs, PIPs, and DIPs good ROM with no synovitis.  No warmth or effusion of knee joints.  No tenderness or swelling  of ankle joints.  No tenderness over trochanteric bursa bilaterally.   CDAI Exam: CDAI Score: Not documented Patient Global Assessment: Not documented; Provider Global Assessment: Not documented Swollen: Not documented; Tender: Not documented Joint Exam   Not documented   There is currently no information documented on the homunculus. Go to the Rheumatology activity and complete the homunculus joint exam.  Investigation: No additional findings.  Imaging: No results found.  Recent Labs: Lab Results  Component Value Date   WBC 4.5 11/27/2014   HGB 12.2 11/27/2014   PLT 206 11/27/2014   NA 149 (H) 05/15/2018   K 3.6 05/15/2018   CL 108 (H) 05/15/2018   CO2 23 05/15/2018   GLUCOSE 95 05/15/2018   BUN 14 05/15/2018   CREATININE 0.97 05/15/2018   BILITOT 0.5 05/15/2018   ALKPHOS 73 05/15/2018   AST 21 05/15/2018   ALT  16 05/15/2018   PROT 7.8 05/15/2018   ALBUMIN 4.9 (H) 05/15/2018   CALCIUM 9.5 05/15/2018   GFRAA 67 05/15/2018    Speciality Comments: No specialty comments available.  Procedures:  No procedures performed Allergies: Fosamax [alendronate sodium]; Omeprazole; Motrin [ibuprofen]; and Fluvastatin   Assessment / Plan:     Visit Diagnoses: Pulmonary sarcoidosis ([place]) - +RF, +ANA:  She has not had any recent flares or recurrences. She has occasional dyspnea on exertion.  She denies any coughing or new pulmonary symptoms.  She will be following up with Dr. Annamaria Boots on 11/24/18. According to his most recent note from 11/25/17 she appears to be in long-term remission and reactivation is unlikely. She is seeing him on a yearly basis.  She was advised to notify us and Dr. Annamaria Boots if she develops any new or worsening symptoms.   Primary osteoarthritis of both hands: She has mild PIP and DIP synovial thickening consistent with osteoarthritis of both hands.  She has no synovitis on exam. She has complete fist formation bilaterally.  Joint protection and muscle strengthening were  discussed.   Primary osteoarthritis of both knees: No warmth or effusion. She has good ROM with no discomfort.  She has been having increased left knee joint pain.  She reports she has been unable to ride the stationary bike recently.  She is going to water aerobics.  She declined cortisone injections and visco gel injections due to having inadequate responses in the past.  She is seeing an orthopedic specialist.   DDD (degenerative disc disease), cervical: She has good ROM of the C-spine.  She has no symptoms of radiculopathy at this time.   Chronic midline low back pain without sciatica: She has limited painful ROM of the lumbar spine.  She has midline spinal tenderness in the lumbar region.  She has no SI joint tenderness.  She has no symptoms of sciatica at this time.  She declined a x-ray at this time.  She is going to physical therapy, which has been helping.  She was given additional exercises that she can perform at home.  She was advised to notify us if she develops worsening lower back pain.   Carpal tunnel syndrome, unspecified laterality: Resolved.   Lateral epicondylitis of both elbows: Resolved.   Osteopenia of multiple sites - Managed by Dr. Baird Cancer.  Other medical conditions are listed as follows:   History of sleep apnea - Uses CPAP machine  History of vitamin D deficiency  History of hyperlipidemia  History of diabetes mellitus  History of hypertension   Orders: No orders of the defined types were placed in this encounter.  No orders of the defined types were placed in this encounter.    Follow-Up Instructions: Return in about 6 months (around 12/11/2018) for Pulmonary sarcoidosis, Osteoarthritis, DDD.   Ofilia Neas, PA-C   I examined and evaluated the patient with Hazel Sams PA.  Patient had no synovitis on examination.  We reviewed her chart and her pulmonary sarcoidosis is in remission.  She continues to have some discomfort in her joints from underlying  osteoarthritis and degenerative disc disease.  The plan of care was discussed as noted above.  Bo Merino, MD  Note - This record has been created using Editor, commissioning.  Chart creation errors have been sought, but may not always  have been located. Such creation errors do not reflect on  the standard of medical care.

## 2018-06-01 NOTE — Progress Notes (Signed)
You are neg for hepatitis c virus.

## 2018-06-12 ENCOUNTER — Encounter: Payer: Self-pay | Admitting: Rheumatology

## 2018-06-12 ENCOUNTER — Ambulatory Visit: Payer: Medicare Other | Admitting: Rheumatology

## 2018-06-12 VITALS — BP 132/67 | HR 57 | Resp 12 | Ht 59.0 in | Wt 151.0 lb

## 2018-06-12 DIAGNOSIS — Z8639 Personal history of other endocrine, nutritional and metabolic disease: Secondary | ICD-10-CM

## 2018-06-12 DIAGNOSIS — M170 Bilateral primary osteoarthritis of knee: Secondary | ICD-10-CM

## 2018-06-12 DIAGNOSIS — M19041 Primary osteoarthritis, right hand: Secondary | ICD-10-CM

## 2018-06-12 DIAGNOSIS — Z8679 Personal history of other diseases of the circulatory system: Secondary | ICD-10-CM

## 2018-06-12 DIAGNOSIS — M7712 Lateral epicondylitis, left elbow: Secondary | ICD-10-CM

## 2018-06-12 DIAGNOSIS — G8929 Other chronic pain: Secondary | ICD-10-CM

## 2018-06-12 DIAGNOSIS — M5030 Other cervical disc degeneration, unspecified cervical region: Secondary | ICD-10-CM

## 2018-06-12 DIAGNOSIS — G5600 Carpal tunnel syndrome, unspecified upper limb: Secondary | ICD-10-CM

## 2018-06-12 DIAGNOSIS — D860 Sarcoidosis of lung: Secondary | ICD-10-CM

## 2018-06-12 DIAGNOSIS — M545 Low back pain, unspecified: Secondary | ICD-10-CM

## 2018-06-12 DIAGNOSIS — Z8669 Personal history of other diseases of the nervous system and sense organs: Secondary | ICD-10-CM

## 2018-06-12 DIAGNOSIS — M7711 Lateral epicondylitis, right elbow: Secondary | ICD-10-CM

## 2018-06-12 DIAGNOSIS — M8589 Other specified disorders of bone density and structure, multiple sites: Secondary | ICD-10-CM

## 2018-06-12 DIAGNOSIS — M19042 Primary osteoarthritis, left hand: Secondary | ICD-10-CM

## 2018-06-12 NOTE — Patient Instructions (Signed)
Back Exercises If you have pain in your back, do these exercises 2-3 times each day or as told by your doctor. When the pain goes away, do the exercises once each day, but repeat the steps more times for each exercise (do more repetitions). If you do not have pain in your back, do these exercises once each day or as told by your doctor. Exercises Single Knee to Chest Do these steps 3-5 times in a row for each leg: 1. Lie on your back on a firm bed or the floor with your legs stretched out. 2. Bring one knee to your chest. 3. Hold your knee to your chest by grabbing your knee or thigh. 4. Pull on your knee until you feel a gentle stretch in your lower back. 5. Keep doing the stretch for 10-30 seconds. 6. Slowly let go of your leg and straighten it. Pelvic Tilt Do these steps 5-10 times in a row: 1. Lie on your back on a firm bed or the floor with your legs stretched out. 2. Bend your knees so they point up to the ceiling. Your feet should be flat on the floor. 3. Tighten your lower belly (abdomen) muscles to press your lower back against the floor. This will make your tailbone point up to the ceiling instead of pointing down to your feet or the floor. 4. Stay in this position for 5-10 seconds while you gently tighten your muscles and breathe evenly. Cat-Cow Do these steps until your lower back bends more easily: 1. Get on your hands and knees on a firm surface. Keep your hands under your shoulders, and keep your knees under your hips. You may put padding under your knees. 2. Let your head hang down, and make your tailbone point down to the floor so your lower back is round like the back of a cat. 3. Stay in this position for 5 seconds. 4. Slowly lift your head and make your tailbone point up to the ceiling so your back hangs low (sags) like the back of a cow. 5. Stay in this position for 5 seconds.  Press-Ups Do these steps 5-10 times in a row: 1. Lie on your belly (face-down) on the  floor. 2. Place your hands near your head, about shoulder-width apart. 3. While you keep your back relaxed and keep your hips on the floor, slowly straighten your arms to raise the top half of your body and lift your shoulders. Do not use your back muscles. To make yourself more comfortable, you may change where you place your hands. 4. Stay in this position for 5 seconds. 5. Slowly return to lying flat on the floor.  Bridges Do these steps 10 times in a row: 1. Lie on your back on a firm surface. 2. Bend your knees so they point up to the ceiling. Your feet should be flat on the floor. 3. Tighten your butt muscles and lift your butt off of the floor until your waist is almost as high as your knees. If you do not feel the muscles working in your butt and the back of your thighs, slide your feet 1-2 inches farther away from your butt. 4. Stay in this position for 3-5 seconds. 5. Slowly lower your butt to the floor, and let your butt muscles relax. If this exercise is too easy, try doing it with your arms crossed over your chest. Belly Crunches Do these steps 5-10 times in a row: 1. Lie on your back on a  firm bed or the floor with your legs stretched out. 2. Bend your knees so they point up to the ceiling. Your feet should be flat on the floor. 3. Cross your arms over your chest. 4. Tip your chin a little bit toward your chest but do not bend your neck. 5. Tighten your belly muscles and slowly raise your chest just enough to lift your shoulder blades a tiny bit off of the floor. 6. Slowly lower your chest and your head to the floor. Back Lifts Do these steps 5-10 times in a row: 1. Lie on your belly (face-down) with your arms at your sides, and rest your forehead on the floor. 2. Tighten the muscles in your legs and your butt. 3. Slowly lift your chest off of the floor while you keep your hips on the floor. Keep the back of your head in line with the curve in your back. Look at the floor  while you do this. 4. Stay in this position for 3-5 seconds. 5. Slowly lower your chest and your face to the floor. Contact a doctor if:  Your back pain gets a lot worse when you do an exercise.  Your back pain does not lessen 2 hours after you exercise. If you have any of these problems, stop doing the exercises. Do not do them again unless your doctor says it is okay. Get help right away if:  You have sudden, very bad back pain. If this happens, stop doing the exercises. Do not do them again unless your doctor says it is okay. This information is not intended to replace advice given to you by your health care provider. Make sure you discuss any questions you have with your health care provider. Document Released: 06/23/2010 Document Revised: 02/12/2018 Document Reviewed: 07/15/2014 Elsevier Interactive Patient Education  2019 [employer] [facility] Ask your health care provider which exercises are safe for you. Do exercises exactly as told by your health care provider and adjust them as directed. It is normal to feel mild stretching, pulling, tightness, or discomfort as you do these exercises, but you should stop right away if you feel sudden pain or your pain gets worse.Do not begin these exercises until told by your health care provider. Stretching and range of motion exercises These exercises warm up your muscles and joints and improve the movement and flexibility of your hip and pelvis. Exercise A: Quadriceps, prone  7. Lie on your abdomen on a firm surface, such as a bed or padded floor. 8. Bend your left / right knee and hold your ankle. If you cannot reach your ankle or pant leg, loop a belt around your foot and grab the belt instead. 9. Gently pull your heel toward your buttocks. Your knee should not slide out to the side. You should feel a stretch in the front of your thigh and knee. 10. Hold this position for __________ seconds. Repeat __________ times.  Complete this stretch __________ times a day. Exercise B: Iliotibial band  5. Lie on your side with your left / right leg in the top position. 6. Bend both of your knees and grab your left / right ankle. Stretch out your bottom arm to help you balance. 7. Slowly bring your top knee back so your thigh goes behind your trunk. 8. Slowly lower your top leg toward the floor until you feel a gentle stretch on the outside of your left / right hip and thigh. If you do not feel  a stretch and your knee will not fall farther, place the heel of your other foot on top of your knee and pull your knee down toward the floor with your foot. 9. Hold this position for __________ seconds. Repeat __________ times. Complete this stretch __________ times a day. Strengthening exercises These exercises build strength and endurance in your hip and pelvis. Endurance is the ability to use your muscles for a long time, even after they get tired. Exercise C: Straight leg raises (hip abductors)  6. Lie on your side with your left / right leg in the top position. Lie so your head, shoulder, knee, and hip line up. You may bend your bottom knee to help you balance. 7. Roll your hips slightly forward so your hips are stacked directly over each other and your left / right knee is facing forward. 8. Tense the muscles in your outer thigh and lift your top leg 4-6 inches (10-15 cm). 9. Hold this position for __________ seconds. 10. Slowly return to the starting position. Let your muscles relax completely before doing another repetition. Repeat __________ times. Complete this exercise __________ times a day. Exercise D: Straight leg raises (hip extensors) 6. Lie on your abdomen on your bed or a firm surface. You can put a pillow under your hips if that is more comfortable. 7. Bend your left / right knee so your foot is straight up in the air. 8. Squeeze your buttock muscles and lift your left / right thigh off the bed. Do not let  your back arch. 9. Tense this muscle as hard as you can without increasing any knee pain. 10. Hold this position for __________ seconds. 11. Slowly lower your leg to the starting position and allow it to relax completely. Repeat __________ times. Complete this exercise __________ times a day. Exercise E: Hip hike 6. Stand sideways on a bottom step. Stand on your left / right leg with your other foot unsupported next to the step. You can hold onto the railing or wall if needed for balance. 7. Keep your knees straight and your torso square. Then, lift your left / right hip up toward the ceiling. 8. Slowly let your left / right hip lower toward the floor, past the starting position. Your foot should get closer to the floor. Do not lean or bend your knees. Repeat __________ times. Complete this exercise __________ times a day. This information is not intended to replace advice given to you by your health care provider. Make sure you discuss any questions you have with your health care provider. Document Released: 05/21/2005 Document Revised: 01/24/2016 Document Reviewed: 04/22/2015 Elsevier Interactive Patient Education  2019 [employer].

## 2018-06-17 ENCOUNTER — Ambulatory Visit: Payer: Medicare Other | Admitting: Internal Medicine

## 2018-06-30 ENCOUNTER — Ambulatory Visit: Payer: Medicare Other | Admitting: Internal Medicine

## 2018-06-30 ENCOUNTER — Encounter: Payer: Self-pay | Admitting: Internal Medicine

## 2018-06-30 VITALS — BP 144/88 | HR 57 | Temp 98.1°F | Ht 59.8 in | Wt 148.6 lb

## 2018-06-30 DIAGNOSIS — E7800 Pure hypercholesterolemia, unspecified: Secondary | ICD-10-CM

## 2018-06-30 DIAGNOSIS — N182 Chronic kidney disease, stage 2 (mild): Secondary | ICD-10-CM | POA: Diagnosis not present

## 2018-06-30 DIAGNOSIS — J0100 Acute maxillary sinusitis, unspecified: Secondary | ICD-10-CM | POA: Diagnosis not present

## 2018-06-30 DIAGNOSIS — E1122 Type 2 diabetes mellitus with diabetic chronic kidney disease: Secondary | ICD-10-CM

## 2018-06-30 DIAGNOSIS — I129 Hypertensive chronic kidney disease with stage 1 through stage 4 chronic kidney disease, or unspecified chronic kidney disease: Secondary | ICD-10-CM

## 2018-06-30 MED ORDER — BENZONATATE 100 MG PO CAPS: 100.0000 mg | ORAL_CAPSULE | Freq: Four times a day (QID) | ORAL | 1 refills | Status: DC | PRN

## 2018-06-30 MED ORDER — AMOXICILLIN-POT CLAVULANATE 875-125 MG PO TABS: 1.0000 | ORAL_TABLET | Freq: Two times a day (BID) | ORAL | 0 refills | Status: AC

## 2018-06-30 NOTE — Progress Notes (Signed)
Subjective:     Patient ID: Mariah Burns , female    DOB: 07/25/1945 , 73 y.o.   MRN: [id number]   Chief Complaint  Patient presents with  . Hypertension  . Cough  . Hyperlipidemia    HPI  Hypertension  This is a chronic problem. The current episode started more than 1 year ago. The problem has been gradually improving since onset. The problem is uncontrolled. Pertinent negatives include no blurred vision, chest pain, palpitations or shortness of breath. Risk factors for coronary artery disease include diabetes mellitus, dyslipidemia, obesity, post-menopausal state and sedentary lifestyle. The current treatment provides moderate improvement. Compliance problems include exercise.  Hypertensive end-organ damage includes kidney disease. Identifiable causes of hypertension include chronic renal disease.  Cough  This is a new problem. The current episode started in the past 7 days. The problem has been gradually worsening. The cough is non-productive. Associated symptoms include ear congestion, nasal congestion and rhinorrhea. Pertinent negatives include no chest pain or shortness of breath. The symptoms are aggravated by lying down.  Hyperlipidemia  This is a chronic problem. The current episode started more than 1 year ago. Recent lipid tests were reviewed and are high. Exacerbating diseases include chronic renal disease and diabetes. Pertinent negatives include no chest pain or shortness of breath. Current antihyperlipidemic treatment includes statins.     Past Medical History:  Diagnosis Date  . Hyperlipidemia   . Hypertension   . OSA (obstructive sleep apnea)    NPSG 03/08/2010  AHI 19.8/hr   . Osteopenia   . 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 Type 2 diabetes mellitus (HCC)      Family History  Problem Relation Age of Onset  . Allergies Sister   . Heart disease Sister    . Lupus Sister   . Allergies Mother   . Allergies Daughter   . Healthy Daughter   . Heart disease Sister   . Gout Brother   . Hypertension Brother   . Heart disease Brother   . Heart failure Father   . Gout Brother   . Hypertension Brother   . Hypertension Brother   . Hypertension Brother   . Post-traumatic stress disorder Brother   . Healthy Daughter      Current Outpatient Medications:  .  felodipine (PLENDIL) 10 MG 24 hr tablet, TAKE 1 TABLET BY MOUTH EVERY DAY, Disp: 90 tablet, Rfl: 2 .  fish oil-omega-3 fatty acids 1000 MG capsule, Take 2 capsules by mouth daily.  , Disp: , Rfl:  .  GLUCOSAMINE-CHONDROITIN PO, Take 2 tablets by mouth daily.  , Disp: , Rfl:  .  glucose blood (ONETOUCH VERIO) test strip, use as directed to check blood sugars 1 time per day dx: e11.65, Disp: , Rfl:  .  L-Methylfolate-Algae-B12-B6 (METANX) 3-90.314-2-35 MG CAPS, Take 1 capsule by mouth 2 (two) times daily. , Disp: , Rfl:  .  Liniments (BLUE-EMU SUPER STRENGTH EX), Apply 1 application topically daily as needed (pain). , Disp: , Rfl:  .  Misc Natural Products (TART CHERRY ADVANCED PO), Take 2 tablets by mouth daily., Disp: , Rfl:  .  nitroGLYCERIN (NITROSTAT) 0.4 MG SL tablet, Place 1 tablet (0.4 mg total) under the tongue every 5 (five) minutes as needed for chest pain., Disp: 25 tablet, Rfl: 2 .  ONETOUCH DELICA LANCETS 94H MISC, stick use as directed to check  blood sugars 1 time per day dx: e11.65, Disp: , Rfl:  .  Polyvinyl Alcohol-Povidone PF (REFRESH) 1.4-0.6 % SOLN, Place 1 drop into both eyes daily as needed (dry eyes). , Disp: , Rfl:  .  pravastatin (PRAVACHOL) 40 MG tablet, Take 1 capsule by mouth on Monday, Wednesday, Friday, Disp: 30 tablet, Rfl: 1 .  Probiotic Product (PROBIOTIC PO), Take by mouth., Disp: , Rfl:  .  amoxicillin-clavulanate (AUGMENTIN) 875-125 MG tablet, Take 1 tablet by mouth 2 (two) times daily for 10 days., Disp: 20 tablet, Rfl: 0 .  benzonatate (TESSALON PERLES) 100  MG capsule, Take 1 capsule (100 mg total) by mouth every 6 (six) hours as needed for cough., Disp: 30 capsule, Rfl: 1 .  traZODone (DESYREL) 50 MG tablet, Take 50 mg by mouth at bedtime., Disp: , Rfl:    Allergies  Allergen Reactions  . Fosamax [Alendronate Sodium] Other (See Comments)    REACTION: "CAUSE GI UPSET AND FATIGUE"  . Omeprazole Other (See Comments)    Lower ab pain  . Motrin [Ibuprofen] Other (See Comments)    GI upset- has to eat prior to taking  . Fluvastatin Other (See Comments)    mild memoryproblems of confusion      Review of Systems  HENT: Positive for rhinorrhea and sinus pressure.   Eyes: Negative for blurred vision.  Respiratory: Positive for cough. Negative for shortness of breath.   Cardiovascular: Negative.  Negative for chest pain and palpitations.  Gastrointestinal: Negative.   Neurological: Negative.   Psychiatric/Behavioral: Negative.      Today's Vitals   06/30/18 1101  BP: (!) 144/88  Pulse: (!) 57  Temp: 98.1 F (36.7 C)  TempSrc: Oral  SpO2: 98%  Weight: 148 lb 9.6 oz (67.4 kg)  Height: 4' 11.8" (1.519 m)  PainSc: 2   PainLoc: Knee   Body mass index is 29.22 kg/m.   Objective:  Physical Exam Vitals signs and nursing note reviewed.  Constitutional:      Appearance: Normal appearance.  HENT:     Head: Normocephalic and atraumatic.     Comments: Maxillary sinuses tender to palpation    Right Ear: Tympanic membrane, ear canal and external ear normal.     Left Ear: Tympanic membrane, ear canal and external ear normal.     Mouth/Throat:     Mouth: Mucous membranes are moist.     Pharynx: Oropharynx is clear. Posterior oropharyngeal erythema present.  Cardiovascular:     Rate and Rhythm: Normal rate and regular rhythm.     Heart sounds: Normal heart sounds.  Pulmonary:     Effort: Pulmonary effort is normal.     Breath sounds: Normal breath sounds.  Skin:    General: Skin is warm.  Neurological:     General: No focal deficit  present.     Mental Status: She is alert.  Psychiatric:        Mood and Affect: Mood normal.         Assessment And Plan:     1. Parenchymal renal hypertension, stage 1 through stage 4 or unspecified chronic kidney disease  Fair control. She will continue with current meds. Today's elevation could be due to her acute illness.   2. Chronic renal disease, stage II  Chronic, I will check GFR, Cr at her next visit. She is encouraged to stay well hydrated.   3. Acute non-recurrent maxillary sinusitis  She was given rx augmentin 875mg  twice daily. She is encouraged to take  full abx course.  She was also given rx tessalon perles to use prn cough.   4. Pure hypercholesterolemia  I will check labs as listed below. She will continue with statin three days weekly. This schedule is working well with her, no myalgias.   - Lipid panel - Liver Profile  5. Type 2 diabetes mellitus with stage 2 chronic kidney disease, without long-term current use of insulin (HCC)  Chronic. She will rto in July 2020 for her next DM evaluation. Importance of regular exercise was discussed with the patient in full detail.         [name], MD

## 2018-06-30 NOTE — Patient Instructions (Signed)

## 2018-07-01 LAB — LIPID PANEL
Chol/HDL Ratio: 3.2 ratio (ref 0.0–4.4)
Cholesterol, Total: 172 mg/dL (ref 100–199)
HDL: 54 mg/dL (ref >39)
LDL CALC: 105 mg/dL — AB (ref 0–99)
Triglycerides: 67 mg/dL (ref 0–149)
VLDL Cholesterol Cal: 13 mg/dL (ref 5–40)

## 2018-07-01 LAB — HEPATIC FUNCTION PANEL
ALBUMIN: 4.6 g/dL (ref 3.7–4.7)
ALT: 22 IU/L (ref 0–32)
AST: 27 IU/L (ref 0–40)
Alkaline Phosphatase: 68 IU/L (ref 39–117)
Bilirubin Total: 0.4 mg/dL (ref 0.0–1.2)
Bilirubin, Direct: 0.1 mg/dL (ref 0.00–0.40)
Total Protein: 7.5 g/dL (ref 6.0–8.5)

## 2018-07-03 ENCOUNTER — Telehealth: Payer: Self-pay

## 2018-07-03 NOTE — Telephone Encounter (Signed)
Left the pt a message to call back for her lab results. 

## 2018-07-03 NOTE — Telephone Encounter (Signed)
-----   Message from Glendale Chard, MD sent at 07/02/2018  6:15 PM EST ----- Your ldl, bad chol is 105. This is down from 138! Wonderful! I am so proud of you! Continue with current meds and move as much as possible. Your liver enzymes are nl.

## 2018-08-03 DIAGNOSIS — C801 Malignant (primary) neoplasm, unspecified: Secondary | ICD-10-CM

## 2018-08-03 HISTORY — DX: Malignant (primary) neoplasm, unspecified: C80.1

## 2018-08-04 ENCOUNTER — Other Ambulatory Visit: Payer: Self-pay | Admitting: Radiology

## 2018-08-06 ENCOUNTER — Telehealth: Payer: Self-pay | Admitting: Hematology

## 2018-08-06 NOTE — Telephone Encounter (Signed)
Spoke with patient to confirm afternoon [facility] appointment for 3/11, [facility] patient no packet sent

## 2018-08-11 ENCOUNTER — Encounter: Payer: Self-pay | Admitting: *Deleted

## 2018-08-11 DIAGNOSIS — Z170 Estrogen receptor positive status [ER+]: Secondary | ICD-10-CM

## 2018-08-11 DIAGNOSIS — C50411 Malignant neoplasm of upper-outer quadrant of right female breast: Secondary | ICD-10-CM

## 2018-08-11 HISTORY — DX: Malignant neoplasm of upper-outer quadrant of right female breast: C50.411

## 2018-08-11 HISTORY — DX: Estrogen receptor positive status (ER+): Z17.0

## 2018-08-13 ENCOUNTER — Encounter: Payer: Self-pay | Admitting: Hematology

## 2018-08-13 ENCOUNTER — Encounter: Payer: Self-pay | Admitting: Physical Therapy

## 2018-08-13 ENCOUNTER — Inpatient Hospital Stay: Payer: Medicare Other | Attending: Hematology | Admitting: Hematology

## 2018-08-13 ENCOUNTER — Other Ambulatory Visit: Payer: Self-pay

## 2018-08-13 ENCOUNTER — Inpatient Hospital Stay: Payer: Medicare Other

## 2018-08-13 ENCOUNTER — Ambulatory Visit: Payer: Self-pay

## 2018-08-13 ENCOUNTER — Ambulatory Visit: Payer: Medicare Other | Attending: General Surgery | Admitting: Physical Therapy

## 2018-08-13 ENCOUNTER — Ambulatory Visit
Admission: RE | Admit: 2018-08-13 | Discharge: 2018-08-13 | Disposition: A | Discharge status: Home / Self Care | Payer: Medicare Other | Source: Ambulatory Visit | Attending: Radiation Oncology | Admitting: Radiation Oncology

## 2018-08-13 ENCOUNTER — Telehealth: Payer: Self-pay | Admitting: Hematology

## 2018-08-13 VITALS — BP 155/58 | HR 70 | Temp 98.9°F | Resp 18 | Ht 59.0 in | Wt 150.1 lb

## 2018-08-13 DIAGNOSIS — M1712 Unilateral primary osteoarthritis, left knee: Secondary | ICD-10-CM

## 2018-08-13 DIAGNOSIS — Z170 Estrogen receptor positive status [ER+]: Secondary | ICD-10-CM | POA: Diagnosis not present

## 2018-08-13 DIAGNOSIS — M1711 Unilateral primary osteoarthritis, right knee: Secondary | ICD-10-CM

## 2018-08-13 DIAGNOSIS — C50411 Malignant neoplasm of upper-outer quadrant of right female breast: Secondary | ICD-10-CM

## 2018-08-13 DIAGNOSIS — G4730 Sleep apnea, unspecified: Secondary | ICD-10-CM

## 2018-08-13 DIAGNOSIS — R7303 Prediabetes: Secondary | ICD-10-CM | POA: Diagnosis not present

## 2018-08-13 DIAGNOSIS — D860 Sarcoidosis of lung: Secondary | ICD-10-CM

## 2018-08-13 DIAGNOSIS — G629 Polyneuropathy, unspecified: Secondary | ICD-10-CM | POA: Diagnosis not present

## 2018-08-13 DIAGNOSIS — M19019 Primary osteoarthritis, unspecified shoulder: Secondary | ICD-10-CM

## 2018-08-13 DIAGNOSIS — R293 Abnormal posture: Secondary | ICD-10-CM | POA: Diagnosis present

## 2018-08-13 DIAGNOSIS — I10 Essential (primary) hypertension: Secondary | ICD-10-CM | POA: Diagnosis not present

## 2018-08-13 DIAGNOSIS — M8580 Other specified disorders of bone density and structure, unspecified site: Secondary | ICD-10-CM | POA: Diagnosis not present

## 2018-08-13 LAB — CMP (CANCER CENTER ONLY)
ALT: 14 U/L (ref 0–44)
AST: 18 U/L (ref 15–41)
Albumin: 4 g/dL (ref 3.5–5.0)
Alkaline Phosphatase: 79 U/L (ref 38–126)
Anion gap: 10 (ref 5–15)
BUN: 12 mg/dL (ref 8–23)
CO2: 26 mmol/L (ref 22–32)
Calcium: 9.5 mg/dL (ref 8.9–10.3)
Chloride: 107 mmol/L (ref 98–111)
Creatinine: 0.91 mg/dL (ref 0.44–1.00)
GFR, Est AFR Am: >60 mL/min (ref >60)
Glucose, Bld: 110 mg/dL — ABNORMAL HIGH (ref 70–99)
Potassium: 3.5 mmol/L (ref 3.5–5.1)
Sodium: 143 mmol/L (ref 135–145)
Total Bilirubin: 0.5 mg/dL (ref 0.3–1.2)
Total Protein: 8.2 g/dL — ABNORMAL HIGH (ref 6.5–8.1)

## 2018-08-13 LAB — CBC WITH DIFFERENTIAL (CANCER CENTER ONLY)
Abs Immature Granulocytes: 0.02 10*3/uL (ref 0.00–0.07)
Basophils Absolute: 0 10*3/uL (ref 0.0–0.1)
Basophils Relative: 1 %
Eosinophils Absolute: 0.1 10*3/uL (ref 0.0–0.5)
Eosinophils Relative: 3 %
HCT: 37.4 % (ref 36.0–46.0)
HEMOGLOBIN: 12 g/dL (ref 12.0–15.0)
Immature Granulocytes: 0 %
Lymphocytes Relative: 36 %
Lymphs Abs: 1.7 10*3/uL (ref 0.7–4.0)
MCH: 28.2 pg (ref 26.0–34.0)
MCHC: 32.1 g/dL (ref 30.0–36.0)
MCV: 88 fL (ref 80.0–100.0)
Monocytes Absolute: 0.4 10*3/uL (ref 0.1–1.0)
Monocytes Relative: 9 %
Neutro Abs: 2.4 10*3/uL (ref 1.7–7.7)
Neutrophils Relative %: 51 %
Platelet Count: 204 10*3/uL (ref 150–400)
RBC: 4.25 MIL/uL (ref 3.87–5.11)
RDW: 14.2 % (ref 11.5–15.5)
WBC Count: 4.6 10*3/uL (ref 4.0–10.5)
nRBC: 0 % (ref 0.0–0.2)

## 2018-08-13 NOTE — Patient Instructions (Signed)

## 2018-08-13 NOTE — Telephone Encounter (Signed)
No los per 3/11. °

## 2018-08-13 NOTE — Therapy (Signed)
[place], [place], [address] Phone: [phone]   Fax:  [phone]  Physical Therapy Evaluation  Patient Details  Name: Mariah Burns MRN: [id number] Date of Birth: 02-Dec-1945 Referring Provider (PT): Dr. Excell Seltzer   Encounter Date: 08/13/2018  PT End of Session - 08/13/18 1946    Visit Number  1    Number of Visits  2    Date for PT Re-Evaluation  10/08/18    PT Start Time  1527    PT Stop Time  1553    PT Time Calculation (min)  26 min    Activity Tolerance  Patient tolerated treatment well    Behavior During Therapy  [facility] for tasks assessed/performed       Past Medical History:  Diagnosis Date  . Hyperlipidemia   . Hypertension   . OSA (obstructive sleep apnea)    NPSG 03/08/2010  AHI 19.8/hr   . Osteopenia   . 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 Type 2 diabetes mellitus ([place])     Past Surgical History:  Procedure Laterality Date  . BRONCHOSCOPY     dx'd sarcoid  . KNEE ARTHROPLASTY Left 03/13/2017  . NEUROPLASTY / TRANSPOSITION ULNAR NERVE AT ELBOW  09/2011  . TOTAL ABDOMINAL HYSTERECTOMY      There were no vitals filed for this visit.   Subjective Assessment - 08/13/18 1933    Subjective  Patient reports she is here today to be seen by her medical team for her newly diagnosed right breat cancer.    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 It measures 1.9 cm and is located in the upper outer quadrant. It is ER/PR positive and HER2 negative with a Ki67 of 10%.     Patient Stated Goals  Reduce lymphedema risk and learn post op shoulder ROM HEP    Currently in Pain?  No/denies   Reports osteoarthritis in many joints; no c/o pain today         [facility] PT Assessment - 08/13/18 0001      Assessment   Medical Diagnosis  Right breast cancer    Referring Provider (PT)  Dr. Excell Seltzer    Onset Date/Surgical Date  08/06/18    Hand Dominance  Right    Prior Therapy  none      Precautions   Precautions  Other (comment)    Precaution Comments  active cancer      Restrictions   Weight Bearing Restrictions  No      Balance Screen   Has the patient fallen in the past 6 months  No    Has the patient had a decrease in activity level because of a fear of falling?   No    Is the patient reluctant to leave their home because of a fear of falling?   No      Home Environment   Living Environment  Private residence    Living Arrangements  Children   Lives with 37 y.o. daughter   Available Help at Discharge  Family      Prior Function   Level of [place]  Retired    Leisure  She does water aerobics and walks  15 min daily      Cognition   Overall Cognitive Status  Within Functional Limits for tasks assessed      Posture/Postural Control   Posture/Postural Control  Postural limitations    Postural Limitations  Rounded Shoulders;Forward head;Increased thoracic kyphosis      ROM / Strength   AROM / PROM / Strength  AROM;Strength      AROM   AROM Assessment Site  Shoulder;Cervical    Right/Left Shoulder  Right;Left    Right Shoulder Extension  45 Degrees    Right Shoulder Flexion  134 Degrees    Right Shoulder ABduction  147 Degrees    Right Shoulder Internal Rotation  60 Degrees    Right Shoulder External Rotation  60 Degrees    Left Shoulder Extension  33 Degrees    Left Shoulder Flexion  122 Degrees    Left Shoulder ABduction  130 Degrees    Left Shoulder Internal Rotation  61 Degrees    Left Shoulder External Rotation  76 Degrees    Cervical Flexion  WNL    Cervical Extension  WNL    Cervical - Right Side Bend  WNL    Cervical - Left Side Bend  WNL    Cervical - Right Rotation   WNL    Cervical - Left Rotation  WNL      Strength   Overall Strength  Within functional limits for tasks performed        LYMPHEDEMA/ONCOLOGY QUESTIONNAIRE - 08/13/18 1941      Type   Cancer Type  Right breast cancer      Lymphedema Assessments   Lymphedema Assessments  Upper extremities      Right Upper Extremity Lymphedema   10 cm Proximal to Olecranon Process  29.5 cm    Olecranon Process  24.9 cm    10 cm Proximal to Ulnar Styloid Process  22.3 cm    Just Proximal to Ulnar Styloid Process  15.4 cm    Across Hand at [employer]  19.6 cm    At [place] of 2nd Digit  6.3 cm      Left Upper Extremity Lymphedema   10 cm Proximal to Olecranon Process  28.9 cm    Olecranon Process  24.3 cm    10 cm Proximal to Ulnar Styloid Process  21.3 cm    Just Proximal to Ulnar Styloid Process  14.8 cm    Across Hand at [employer]  17.9 cm    At [place] of 2nd Digit  6.2 cm          Quick Dash - 08/13/18 0001    Open a tight or new jar  Severe difficulty    Do heavy household chores (wash walls, wash floors)  Moderate difficulty    Carry a shopping bag or briefcase  Moderate difficulty    Wash your back  Mild difficulty    Use a knife to cut food  No difficulty    Recreational activities in which you take some force or impact through your arm, shoulder, or hand (golf, hammering, tennis)  Mild difficulty    During the past week, to what extent has your arm, shoulder or hand problem interfered with your normal social activities with family, friends, neighbors, or groups?  Not at all    During the past week, to what extent has your arm, shoulder or hand problem limited your work or other regular daily activities  Slightly  Arm, shoulder, or hand pain.  Mild    Tingling (pins and needles) in your arm, shoulder, or hand  Mild    Difficulty Sleeping  Mild difficulty    DASH Score  29.55 %        Objective measurements completed on examination: See above findings.     Patient  was instructed today in a home exercise program today for post op shoulder range of motion. These included active assist shoulder flexion in sitting, scapular retraction, wall walking with shoulder abduction, and hands behind head external rotation.  She was encouraged to do these twice a day, holding 3 seconds and repeating 5 times when permitted by her physician.        PT Education - 08/13/18 1944    Education Details  Lymphedema risk reduction and post op shoulder ROM HEP    Person(s) Educated  Patient;Child(ren)    Methods  Explanation;Demonstration;Handout    Comprehension  Returned demonstration;Verbalized understanding          PT Long Term Goals - 08/13/18 1955      PT LONG TERM GOAL #1   Title  Patient will demonstrate she has regained full shoulder ROM and function post operatively compared to baselines.    Time  [facility] Goals - 08/13/18 1954      Patient will be able to verbalize understanding of pertinent lymphedema risk reduction practices relevant to her diagnosis specifically related to skin care.   Time  1    Period  Days    Status  Achieved      Patient will be able to return demonstrate and/or verbalize understanding of the post-op home exercise program related to regaining shoulder range of motion.   Time  1    Period  Days    Status  Achieved      Patient will be able to verbalize understanding of the importance of attending the postoperative After Breast Cancer Class for further lymphedema risk reduction education and therapeutic exercise.   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 It measures 1.9 cm and is located in the upper outer quadrant. It is ER/PR positive and HER2 negative with a Ki67 of 10%.  Her multidisciplinary medical  team met prior to her assessments to determine a recommended treatment plan. She is planning to have a right lumpectomy and sentinel node biopsy followed by Oncotype testing, possible radiation, and anti-estrogen therapy. She will benefit from a post op PT assessment to determine needs.    Stability/Clinical Decision Making  Stable/Uncomplicated    Clinical Decision Making  Low    Rehab Potential  Excellent    PT Frequency  --   Eval and 1 f/u visit   PT Treatment/Interventions  ADLs/Self Care Home Management;Patient/family education;Therapeutic exercise    PT Next Visit Plan  Will reassess 3-4 weeks post op to determine needs    PT Home Exercise Plan  Post op shoulder ROM HEP    Consulted and Agree with Plan of Care  Patient;Family member/caregiver    Family Member Consulted  daughter       Patient will benefit from skilled therapeutic  intervention in order to improve the following deficits and impairments:  Decreased range of motion, Impaired UE functional use, Pain, Decreased knowledge of precautions  Visit Diagnosis: Malignant neoplasm of upper-outer quadrant of right breast in female, estrogen receptor positive ([place]) - Plan: PT plan of care cert/re-cert  Abnormal posture - Plan: PT plan of care cert/re-cert   Patient will follow up at outpatient cancer rehab 3-4 weeks following surgery.  If the patient requires physical therapy at that time, a specific plan will be dictated and sent to the referring physician for approval. The patient was educated today on appropriate basic range of motion exercises to begin post operatively and the importance of attending the After Breast Cancer class following surgery.  Patient was educated today on lymphedema risk reduction practices as it pertains to recommendations that will benefit the patient immediately following surgery.  She verbalized good understanding.      Problem List Patient Active Problem List   Diagnosis Date Noted  . Malignant  neoplasm of upper-outer quadrant of right breast in female, estrogen receptor positive ([place]) 08/11/2018  . Chronic renal disease, stage II 05/15/2018  . Hypertensive nephropathy 05/15/2018  . Adult BMI 29.0-29.9 kg/sq m 05/15/2018  . Dyspnea on exertion 02/17/2015  . Incomplete right bundle branch block 02/17/2015  . Systolic murmur 92/06/69  . Pericardial effusion 02/17/2015  . Osteopenia 10/05/2011  . DM (diabetes mellitus), type 2 with complications ([place]) 21/97/5883  . SARCOIDOSIS 03/21/2007  . HYPERLIPIDEMIA 03/21/2007  . Obstructive sleep apnea 03/21/2007  . Essential hypertension 03/21/2007   Annia Friendly, PT 08/13/18 8:01 PM  [place], [place], [address] Phone: [phone]   Fax:  [phone]  Name: Mariah Burns MRN: [id number] Date of Birth: March 18, 1946

## 2018-08-13 NOTE — Progress Notes (Signed)
[place]   Telephone:([phone]) [phone] Fax:([phone] [place] Note   Patient Care Team: Glendale Chard, MD as PCP - General (Internal Medicine) Mauro Kaufmann, RN as Oncology Nurse Navigator Rockwell Germany, RN as Oncology Nurse Navigator Excell Seltzer, MD as Consulting Physician (General Surgery) Truitt Merle, MD as Consulting Physician (Hematology) Gery Pray, MD as Consulting Physician (Radiation Oncology)  Date of Service:  08/13/2018   CHIEF COMPLAINTS/PURPOSE OF CONSULTATION:  Newly Diagnosed Malignant neoplasm of upper-outer quadrant of right breast    Oncology History   Cancer Staging Malignant neoplasm of upper-outer quadrant of right breast in female, estrogen receptor positive ([place]) Staging form: Breast, AJCC 8th Edition - Clinical stage from 08/04/2018: Stage IA (cT1c, cN0, cM0, G2, ER+, PR+, HER2-) - Signed by Truitt Merle, MD on 08/12/2018       Malignant neoplasm of upper-outer quadrant of right breast in female, estrogen receptor positive ([place])   07/29/2018 Mammogram    Diagnostic Mammgram 07/29/18  IMPRESSION:  The 1.7cm oval mass in the right breast upper outer qudrant middle third, 3-5 from nipple is suspicious of malignancy.     08/04/2018 Cancer Staging    Staging form: Breast, AJCC 8th Edition - Clinical stage from 08/04/2018: Stage IA (cT1c, cN0, cM0, G2, ER+, PR+, HER2-) - Signed by Truitt Merle, MD on 08/12/2018    08/04/2018 Initial Biopsy    Diagnosis 08/04/18  Breast, right, needle core biopsy, 1.7 cm irregular solid mass at 9 o'clock, 4 cm fn - INVASIVE DUCTAL CARCINOMA WITH EXTRACELLULAR MUCIN.    08/04/2018 Receptors her2    Results: IMMUNOHISTOCHEMICAL AND MORPHOMETRIC ANALYSIS PERFORMED MANUALLY The tumor cells are NEGATIVE for Her2 (0). Estrogen Receptor: 100%, POSITIVE, STRONG STAINING INTENSITY Progesterone Receptor: 100%, POSITIVE, STRONG STAINING INTENSITY Proliferation Marker Ki67: 10%    08/11/2018 Initial  Diagnosis    Malignant neoplasm of upper-outer quadrant of right breast in female, estrogen receptor positive ([place])      HISTORY OF PRESENTING ILLNESS:  Mariah Burns 73 y.o. female is a here because of newly diagnosed right breast cancer. The patient presents to the Breast clinic today accompanied by her 2 daughters.   She found her mass by screening mammogram. She did not feel any lump in her breast. She feels at baseline with no noticeable breast change.  Today the patient notes OA pain in her knees, back, and shoulder. Mostly stable. She denies chest, breathing or abdominal issues. She eats adequately and denies weight loss. She notes neuropathy in her feet for which she takes Mentanx. She notes she is active with water aerobics. She has not been doing that lately.   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 She had 3 adult children. She lives with her youngest daughter. She does not drink or smoke.  She notes being pre-diabetic, not on medication. She has a PMHx of HLD, HTN, osteopenia and sarcoidosis in her lungs. She has sleep apnea and uses CPAP machine.  She had orthoscopic surgery to repair her meniscus and since then her knee pain is better. She ambulates well. She takes Glucosamine for her knee. She notes she has h/o of ulcers. She tries not to take meds that effect it. She notes her menopause was fine and took medication through this time. I reviewed her medication list with her.  She denies knowing any family history of cancer.     GYN HISTORY  Menarchal: 10 LMP: 1975 from hysterectomy due to menorrhagia. She went through menopause  later in her 14s.  Contraceptive: NA HRT: No G3P3: all daughters, first at 49yo.     REVIEW OF SYSTEMS:    Constitutional: Denies fevers, chills or abnormal night sweats Eyes: Denies blurriness of vision, double vision or watery eyes Ears, nose, mouth, throat, and face: Denies mucositis or sore throat Respiratory: Denies cough, dyspnea or  wheezes Cardiovascular: Denies palpitation, chest discomfort or lower extremity swelling Gastrointestinal:  Denies nausea, heartburn or change in bowel habits Skin: Denies abnormal skin rashes MSK: (+) OA in knees, shoulders, back   Lymphatics: Denies new lymphadenopathy or easy bruising Neurological: Denies new weaknesses (+) Nueropathy in feet  Behavioral/Psych: Mood is stable, no new changes  Breast: (+) Soreness from biopsy  All other systems were reviewed with the patient and are negative.   MEDICAL HISTORY:  Past Medical History:  Diagnosis Date  . Hyperlipidemia   . Hypertension   . OSA (obstructive sleep apnea)    NPSG 03/08/2010  AHI 19.8/hr   . Osteopenia   . 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 Type 2 diabetes mellitus ([place])     SURGICAL HISTORY: Past Surgical History:  Procedure Laterality Date  . BRONCHOSCOPY     dx'd sarcoid  . KNEE ARTHROPLASTY Left 03/13/2017  . NEUROPLASTY / TRANSPOSITION ULNAR NERVE AT ELBOW  09/2011  . TOTAL ABDOMINAL HYSTERECTOMY      SOCIAL HISTORY: Social History   Socioeconomic History  . Marital status: Divorced    Spouse name: Not on file  . Number of children: 3  . Years of education: College  . Highest education level: Not on file  Occupational History  . Occupation: Retired    Fish farm manager: RETIRED  Social Needs  . Financial resource strain: Not on file  . Food insecurity:    Worry: Not on file    Inability: Not on file  . Transportation needs:    Medical: Not on file    Non-medical: Not on file  Tobacco Use  . Smoking status: Never Smoker  . Smokeless tobacco: Never Used  Substance and Sexual Activity  . Alcohol use: No  . Drug use: No  . Sexual activity: Not on file  Lifestyle  . Physical activity:    Days per week: Not on file    Minutes per session: Not on file  . Stress: Not on file  Relationships  .  Social connections:    Talks on phone: Not on file    Gets together: Not on file    Attends religious service: Not on file    Active member of club or organization: Not on file    Attends meetings of clubs or organizations: Not on file    Relationship status: Not on file  . Intimate partner violence:    Fear of current or ex partner: Not on file    Emotionally abused: Not on file    Physically abused: Not on file    Forced sexual activity: Not on file  Other Topics Concern  . Not on file  Social History Narrative   Patient lives at home with daughter.   Caffeine Use: none    FAMILY HISTORY: Family History  Problem Relation Age of Onset  . Allergies Sister   . Heart disease Sister   . Lupus Sister   . Allergies Mother   . Allergies Daughter   . Healthy Daughter   .  Heart disease Sister   . Gout Brother   . Hypertension Brother   . Heart disease Brother   . Heart failure Father   . Gout Brother   . Hypertension Brother   . Hypertension Brother   . Hypertension Brother   . Post-traumatic stress disorder Brother   . Healthy Daughter     ALLERGIES:  is allergic to fosamax [alendronate sodium]; omeprazole; motrin [ibuprofen]; and fluvastatin.  MEDICATIONS:  Current Outpatient Medications  Medication Sig Dispense Refill  . aspirin 81 MG chewable tablet Chew by mouth daily. Not taking daily    . benzonatate (TESSALON PERLES) 100 MG capsule Take 1 capsule (100 mg total) by mouth every 6 (six) hours as needed for cough. 30 capsule 1  . felodipine (PLENDIL) 10 MG 24 hr tablet TAKE 1 TABLET BY MOUTH EVERY DAY 90 tablet 2  . fish oil-omega-3 fatty acids 1000 MG capsule Take 2 capsules by mouth daily.      Marland Kitchen GLUCOSAMINE-CHONDROITIN PO Take 2 tablets by mouth daily.      Marland Kitchen glucose blood (ONETOUCH VERIO) test strip use as directed to check blood sugars 1 time per day dx: e11.65    . L-Methylfolate-Algae-B12-B6 (METANX) 3-90.314-2-35 MG CAPS Take 1 capsule by mouth 2 (two) times  daily.     . Liniments (BLUE-EMU SUPER STRENGTH EX) Apply 1 application topically daily as needed (pain).     . Misc Natural Products (TART CHERRY ADVANCED PO) Take 2 tablets by mouth daily.    Glory Rosebush DELICA LANCETS 16B MISC stick use as directed to check blood sugars 1 time per day dx: e11.65    . Polyvinyl Alcohol-Povidone PF (REFRESH) 1.4-0.6 % SOLN Place 1 drop into both eyes daily as needed (dry eyes).     . pravastatin (PRAVACHOL) 40 MG tablet Take 1 capsule by mouth on Monday, Wednesday, Friday 30 tablet 1  . nitroGLYCERIN (NITROSTAT) 0.4 MG SL tablet Place 1 tablet (0.4 mg total) under the tongue every 5 (five) minutes as needed for chest pain. (Patient not taking: Reported on 08/13/2018) 25 tablet 2  . Probiotic Product (PROBIOTIC PO) Take by mouth.     No current facility-administered medications for this visit.     PHYSICAL EXAMINATION: ECOG PERFORMANCE STATUS: 0 - Asymptomatic  Vitals:   08/13/18 1322  BP: (!) 155/58  Pulse: 70  Resp: 18  Temp: 98.9 F (37.2 C)  SpO2: 100%   Filed Weights   08/13/18 1322  Weight: 150 lb 1.6 oz (68.1 kg)    GENERAL:alert, no distress and comfortable SKIN: skin color, texture, turgor are normal, no rashes or significant lesions EYES: normal, conjunctiva are pink and non-injected, sclera clear OROPHARYNX:no exudate, no erythema and lips, buccal mucosa, and tongue normal  NECK: supple, thyroid normal size, non-tender, without nodularity LYMPH:  no palpable lymphadenopathy in the cervical, axillary or inguinal LUNGS: clear to auscultation and percussion with normal breathing effort HEART: regular rate and no lower extremity edema (+) Mild heart murmur  ABDOMEN:abdomen soft, non-tender and normal bowel sounds Musculoskeletal:no cyanosis of digits and no clubbing  PSYCH: alert & oriented x 3 with fluent speech NEURO: no focal motor/sensory deficits BREAST: (+) right breast Mild skin ecchymosis at biopsy site with 1.5x2cm lump in right  breast around 7:00 position possibly from biopsy (+) No other palpable mass or adenopathy in either breasts  LABORATORY DATA:  I have reviewed the data as listed CBC Latest Ref Rng & Units 08/13/2018 11/27/2014 12/04/2006  WBC 4.0 -  10.5 K/uL 4.6 4.5 3.8(L)  Hemoglobin 12.0 - 15.0 g/dL 12.0 12.2 11.8(L)  Hematocrit 36.0 - 46.0 % 37.4 36.4 34.4(L)  Platelets 150 - 400 K/uL 204 206 227    CMP Latest Ref Rng & Units 08/13/2018 06/30/2018 05/15/2018  Glucose 70 - 99 mg/dL 110(H) - 95  BUN 8 - 23 mg/dL 12 - 14  Creatinine 0.44 - 1.00 mg/dL 0.91 - 0.97  Sodium 135 - 145 mmol/L 143 - 149(H)  Potassium 3.5 - 5.1 mmol/L 3.5 - 3.6  Chloride 98 - 111 mmol/L 107 - 108(H)  CO2 22 - 32 mmol/L 26 - 23  Calcium 8.9 - 10.3 mg/dL 9.5 - 9.5  Total Protein 6.5 - 8.1 g/dL 8.2(H) 7.5 7.8  Total Bilirubin 0.3 - 1.2 mg/dL 0.5 0.4 0.5  Alkaline Phos 38 - 126 U/L 79 68 73  AST 15 - 41 U/L 18 27 21   ALT 0 - 44 U/L 14 22 16      RADIOGRAPHIC STUDIES: I have personally reviewed the radiological images as listed and agreed with the findings in the report. No results found.  ASSESSMENT & PLAN:  Mariah Burns is a 73 y.o. African-American female with a history of mild heart murmur, osteoarthritis, HLD, HTN, Osteopenia, Pre-diabetic (not on medication), sarcoidosis of lungs, and sleep apnea (on CPAP).   1. Malignant neoplasm of upper-outer quadrant of right breast, Stage IA, c(T1c,N0,M0), ER/PR+, HER2-, Grade II -We discussed her image findings and the biopsy results in great details. She has invasive ductal carcinoma of right breast.  -Giving the relative small size of tumor, she is a candidate for lumpectomy with possible sentinel lymph node biopsy. She is agreeable with that. She was seen by Dr. Excell Seltzer today and likely will proceed with surgery soon.  -I discussed the option of Oncotype Dx test on the surgical sample to determine her risk of recurrence and benefit of adjuvant chemotherapy. I suspect low risk  disease given the strong ER and PR positive disease and early stage. She is interested. She is 22, but overall health is good, would be a candidate for chemo if needed -She was also seen by radiation oncologist Dr. Sondra Come today who discussed her option for adjuvant radiation to reduce the risk for local recurrence.  -Giving the strong ER and PR expression in her postmenopausal status, I recommend adjuvant endocrine therapy with aromatase inhibitor for a total of 5-10 years to reduce the risk of cancer recurrence. Potential benefits and side effects were discussed with patient and she is interested.  -We also discussed the breast cancer surveillance after her surgery. She will continue annual screening mammogram, self exam, and a routine office visit with lab and exam with [place]. -Labs reviewed, CBC and CMP WNL, BG at 110, Protein at 8.2.  -F/u likely after radiation   2. Genetic Testing  -She is not very aware of her family history of cancer.  -Given she has 3 daughters, she will consider genetic testing even if out of pocket.   3. HTN and DM -overall controlled, f/u with PCP   PLAN:  -Proceed with surgery soon  -oncotype on surgical sample  -F/u likely after radiation or sooner if needed    No orders of the defined types were placed in this encounter.   All questions were answered. The patient knows to call the clinic with any problems, questions or concerns. I spent 30 minutes counseling the patient face to face. The total time spent in the appointment was 45 minutes  and more than 50% was on counseling.     Truitt Merle, MD 08/13/2018 8:04 PM  I, Joslyn Devon, am acting as scribe for Truitt Merle, MD.   I have reviewed the above documentation for accuracy and completeness, and I agree with the above.

## 2018-08-13 NOTE — Progress Notes (Signed)
Nutrition Assessment  Reason for Assessment:  Pt seen in Breast Clinic  ASSESSMENT:  73 year old female with new diagnosis of breast cancer.  Past medical history of DM, HTN.    Patient reports normal appetite  Medications:  reviewed  Labs: glucose 110  Anthropometrics:   Height: 4'11" Weight: 150 lb 1.6 oz BMI: 30   NUTRITION DIAGNOSIS: Food and nutrition related knowledge deficit related to new diagnosis of breast cancer as evidenced by no prior need for nutrition related information.  INTERVENTION:   Discussed and provided packet of information regarding nutritional tips for breast cancer patients.  Questions answered.  Teachback method used.  Contact information provided and patient knows to contact me with questions/concerns.    MONITORING, EVALUATION, and GOAL: Pt will consume a healthy plant based diet to maintain lean body mass throughout treatment.   Jennell Janosik B. Zenia Resides, [place], [place] Registered Dietitian [phone] (pager)

## 2018-08-13 NOTE — Progress Notes (Signed)
Radiation Oncology         [phone]) [phone] ________________________________  Multidisciplinary Breast Oncology Clinic [facility]) Initial Outpatient Consultation  Name: Mariah Burns MRN: [id number]  Date: 08/13/2018  DOB: 1945/07/15  [id number], Bailey Mech, MD  Excell Seltzer, MD   REFERRING PHYSICIAN: Excell Seltzer, MD  DIAGNOSIS: The encounter diagnosis was Malignant neoplasm of upper-outer quadrant of right breast in female, estrogen receptor positive ([place]).   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 Malignant neoplasm of upper-outer quadrant of right breast in female, estrogen receptor positive ([place]) C50.411    Z17.0    Oncology History   Cancer Staging Malignant neoplasm of upper-outer quadrant of right breast in female, estrogen receptor positive ([place]) Staging form: Breast, AJCC 8th Edition - Clinical stage from 08/04/2018: Stage IA (cT1c, cN0, cM0, G2, ER+, PR+, HER2-) - Signed by Truitt Merle, MD on 08/12/2018       Malignant neoplasm of upper-outer quadrant of right breast in female, estrogen receptor positive ([place])   07/29/2018 Mammogram    Diagnostic Mammgram 07/29/18  IMPRESSION:  The 1.7cm oval mass in the right breast upper outer qudrant middle third, 3-5 from nipple is suspicious of malignancy.     08/04/2018 Cancer Staging    Staging form: Breast, AJCC 8th Edition - Clinical stage from 08/04/2018: Stage IA (cT1c, cN0, cM0, G2, ER+, PR+, HER2-) - Signed by Truitt Merle, MD on 08/12/2018    08/04/2018 Initial Biopsy    Diagnosis 08/04/18  Breast, right, needle core biopsy, 1.7 cm irregular solid mass at 9 o'clock, 4 cm fn - INVASIVE DUCTAL CARCINOMA WITH EXTRACELLULAR MUCIN.    08/04/2018 Receptors her2    Results: IMMUNOHISTOCHEMICAL AND MORPHOMETRIC ANALYSIS PERFORMED MANUALLY The tumor cells are NEGATIVE for Her2 (0). Estrogen Receptor: 100%, POSITIVE, STRONG STAINING INTENSITY Progesterone  Receptor: 100%, POSITIVE, STRONG STAINING INTENSITY Proliferation Marker Ki67: 10%    08/11/2018 Initial Diagnosis    Malignant neoplasm of upper-outer quadrant of right breast in female, estrogen receptor positive ([place])    HISTORY OF PRESENT ILLNESS::Mariah Burns is a 73 y.o. female who is presenting to the office today for evaluation of her newly diagnosed breast cancer. She is accompanied by two daughters. She is doing well overall.   She had routine screening mammography on 08/05/18 showing a possible abnormality in the right breast. She underwent bilateral diagnostic mammography with tomography and right breast ultrasonography at [facility] on 08/05/18 showing: 1.7 cm irregular solid mass in the right breast suspicious for malignancy.  Biopsy on 08/04/18 showed: a 1.7 cm irregular solid mass at 9 o'clock, 4 cm fn, invasive ductal carcinoma with extracellular mucin. Prognostic indicators significant for: estrogen receptor, 100% positive and progesterone receptor, 100% positive, both with strong staining intensity. Proliferation marker Ki67 at 10%. HER2 negative.  Menarche: 73 years old Age at first live birth: 73 years old GP: GxP3 LMP: 1975 Contraceptive: Yes HRT: No   The patient was referred today for presentation in the multidisciplinary conference.  Radiology studies and pathology slides were presented there for review and discussion of treatment options.  A consensus was discussed regarding potential next steps.  PREVIOUS RADIATION THERAPY: No  PAST MEDICAL HISTORY:  has a past medical history of Hyperlipidemia, Hypertension, OSA (obstructive sleep apnea), Osteopenia, Sarcoidosis, and Type 2 diabetes mellitus ([place]).    PAST SURGICAL HISTORY: Past Surgical History:  Procedure Laterality Date   BRONCHOSCOPY  dx'd sarcoid   KNEE ARTHROPLASTY Left 03/13/2017   NEUROPLASTY / TRANSPOSITION ULNAR NERVE AT ELBOW  09/2011   TOTAL ABDOMINAL HYSTERECTOMY      FAMILY HISTORY: family  history includes Allergies in her daughter, mother, and sister; Gout in her brother and brother; Healthy in her daughter and daughter; Heart disease in her brother, sister, and sister; Heart failure in her father; Hypertension in her brother, brother, brother, and brother; Lupus in her sister; Post-traumatic stress disorder in her brother.  SOCIAL HISTORY:  reports that she has never smoked. She has never used smokeless tobacco. She reports that she does not drink alcohol or use drugs. 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 She had 3 adult children. She lives with her youngest daughter.  ALLERGIES: Fosamax [alendronate sodium]; Omeprazole; Motrin [ibuprofen]; and Fluvastatin  MEDICATIONS:  Current Outpatient Medications  Medication Sig Dispense Refill   aspirin 81 MG chewable tablet Chew by mouth daily. Not taking daily     benzonatate (TESSALON PERLES) 100 MG capsule Take 1 capsule (100 mg total) by mouth every 6 (six) hours as needed for cough. 30 capsule 1   felodipine (PLENDIL) 10 MG 24 hr tablet TAKE 1 TABLET BY MOUTH EVERY DAY 90 tablet 2   fish oil-omega-3 fatty acids 1000 MG capsule Take 2 capsules by mouth daily.       GLUCOSAMINE-CHONDROITIN PO Take 2 tablets by mouth daily.       glucose blood (ONETOUCH VERIO) test strip use as directed to check blood sugars 1 time per day dx: e11.65     L-Methylfolate-Algae-B12-B6 (METANX) 3-90.314-2-35 MG CAPS Take 1 capsule by mouth 2 (two) times daily.      Liniments (BLUE-EMU SUPER STRENGTH EX) Apply 1 application topically daily as needed (pain).      Misc Natural Products (TART CHERRY ADVANCED PO) Take 2 tablets by mouth daily.     nitroGLYCERIN (NITROSTAT) 0.4 MG SL tablet Place 1 tablet (0.4 mg total) under the tongue every 5 (five) minutes as needed for chest pain. (Patient not taking: Reported on 08/13/2018) 25 tablet 2   ONETOUCH DELICA LANCETS 94B MISC stick use as directed to check blood sugars 1 time per day dx: e11.65      Polyvinyl Alcohol-Povidone PF (REFRESH) 1.4-0.6 % SOLN Place 1 drop into both eyes daily as needed (dry eyes).      pravastatin (PRAVACHOL) 40 MG tablet Take 1 capsule by mouth on Monday, Wednesday, Friday 30 tablet 1   Probiotic Product (PROBIOTIC PO) Take by mouth.     No current facility-administered medications for this encounter.     REVIEW OF SYSTEMS:  REVIEW OF SYSTEMS: A 10+ POINT REVIEW OF SYSTEMS WAS OBTAINED including neurology, dermatology, psychiatry, cardiac, respiratory, lymph, extremities, GI, GU, musculoskeletal, constitutional, reproductive, HEENT.On the provided form, she reports night sweats, sleep loss, pain, SOB with exertion, sleeping on multiple pillows, heartburn, back pain, arthritis, anxiety, depression, suicidal thoughts, and diabetes. She denies any other symptoms.    PHYSICAL EXAM:  Vitals with BMI 08/13/2018  Height 4' 11"   Weight 150 lbs 2 oz  BMI 09.6  Systolic 283  Diastolic 58  Pulse 70  Respirations 18   Lungs are clear to auscultation bilaterally. Heart has regular rate and rhythm. No palpable cervical, supraclavicular, or axillary adenopathy. Abdomen soft, non-tender, normal bowel sounds. Breast: Left breast with no palpable mass, nipple discharge, or bleeding. Right breast with some bruising in the lower outer quadrant and a palpable area of induration measuring approximately 2.5  x 3 cm. She also has some steristrips in place. .   ECOG = 1  0 - Asymptomatic (Fully active, able to carry on all predisease activities without restriction)  1 - Symptomatic but completely ambulatory (Restricted in physically strenuous activity but ambulatory and able to carry out work of a light or sedentary nature. For example, light housework, office work)  2 - Symptomatic, <50% in bed during the day (Ambulatory and capable of all self care but unable to carry out any work activities. Up and about more than 50% of waking hours)  3 - Symptomatic, >50% in bed, but not  bedbound (Capable of only limited self-care, confined to bed or chair 50% or more of waking hours)  4 - Bedbound (Completely disabled. Cannot carry on any self-care. Totally confined to bed or chair)  5 - Death   Eustace Pen MM, Creech RH, Tormey DC, et al. [phone]). "Toxicity and response criteria of the [facility]". [place] Oncol. 5 (6): 649-55  LABORATORY DATA:  Lab Results  Component Value Date   WBC 4.6 08/13/2018   HGB 12.0 08/13/2018   HCT 37.4 08/13/2018   MCV 88.0 08/13/2018   PLT 204 08/13/2018   Lab Results  Component Value Date   NA 143 08/13/2018   K 3.5 08/13/2018   CL 107 08/13/2018   CO2 26 08/13/2018   Lab Results  Component Value Date   ALT 14 08/13/2018   AST 18 08/13/2018   ALKPHOS 79 08/13/2018   BILITOT 0.5 08/13/2018    PULMONARY FUNCTION TEST:   Recent Review Flowsheet Data    There is no flowsheet data to display.      RADIOGRAPHY: No results found.    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 We discussed the general course of radiation, potential side effects, and toxicities with radiation and the patient is interested in this approach. Depending on the final pathologic results, she may be able to avoid radiation if she agrees to hormonal therapy.    PLAN:  1. Right lumpectomy with SLN vs. mastectomy 2. Oncotype 3. XRT 4. AI   ------------------------------------------------  Blair Promise, PhD, MD This document serves as a record of services personally performed by Gery Pray, MD. It was created on his behalf by Mary-Margaret Loma Messing, a trained medical scribe. The creation of this record is based on the scribe's personal observations and the provider's statements to them. This document has been checked and approved by the attending provider.

## 2018-08-14 ENCOUNTER — Telehealth: Payer: Self-pay | Admitting: *Deleted

## 2018-08-14 NOTE — Telephone Encounter (Signed)
   [facility] Pre-operative Risk Assessment    Request for surgical clearance:  1. What type of surgery is being performed? BREAST LUMPECTOMY   2. When is this surgery scheduled? TBD   3. What type of clearance is required (medical clearance vs. Pharmacy clearance to hold med vs. Both)? MEDICAL  4. Are there any medications that need to be held prior to surgery and how long?ASA   5. Practice name and name of physician performing surgery? [facility]; [place]   6. What is your office phone number [phone]    7.   What is your office fax number [phone]  8.   Anesthesia type (None, local, MAC, general) ? GENERAL   Mariah Burns 08/14/2018, 10:15 AM  _________________________________________________________________   (provider comments below)

## 2018-08-14 NOTE — Telephone Encounter (Signed)
   Primary Cardiologist:Henry Nicholes Stairs III, MD  Chart reviewed as part of pre-operative protocol coverage. Because of Mariah Burns's past medical history and time since last visit, he/she will require a follow-up visit in order to better assess preoperative cardiovascular risk.  Pre-op covering staff: - Please schedule appointment and call patient to inform them. - Please contact requesting surgeon's office via preferred method (i.e, phone, fax) to inform them of need for appointment prior to surgery.  If applicable, this message will also be routed to pharmacy pool and/or primary cardiologist for input on holding anticoagulant/antiplatelet agent as requested below so that this information is available at time of patient's appointment.   Cecilie Kicks, NP  08/14/2018, 2:38 PM

## 2018-08-14 NOTE — Telephone Encounter (Signed)
SPOKE WITH PT AND PT IS SCHEDULED WITH LG 08-19-18

## 2018-08-15 NOTE — Telephone Encounter (Signed)
Called and spoke with [facility] and they are aware that patient is being seen on 08/19/18 for surgical clearance and that once we clear patient we will send clearance to their office.

## 2018-08-18 ENCOUNTER — Encounter: Payer: Self-pay | Admitting: Internal Medicine

## 2018-08-19 ENCOUNTER — Encounter: Payer: Self-pay | Admitting: Nurse Practitioner

## 2018-08-19 ENCOUNTER — Telehealth: Payer: Self-pay | Admitting: *Deleted

## 2018-08-19 ENCOUNTER — Ambulatory Visit: Payer: Medicare Other | Admitting: Nurse Practitioner

## 2018-08-19 ENCOUNTER — Other Ambulatory Visit: Payer: Self-pay

## 2018-08-19 VITALS — BP 142/58 | HR 59 | Ht 59.0 in | Wt 152.0 lb

## 2018-08-19 DIAGNOSIS — Z01818 Encounter for other preprocedural examination: Secondary | ICD-10-CM | POA: Diagnosis not present

## 2018-08-19 NOTE — Telephone Encounter (Signed)
Spoke to pt concerning [place] from 3.11.20. Denies questions or concerns regarding dx or treatment care plan. Pt has decided to proceed with a lumpectomy. Physician team notified. Encourage pt to call with needs. Received verbal understanding.

## 2018-08-19 NOTE — Progress Notes (Addendum)
CARDIOLOGY OFFICE NOTE  Date:  08/19/2018    Mariah Burns Date of Birth: [birth year] Medical Record #[id number]  PCP:  Glendale Chard, MD  Cardiologist:  Jennings Books    Chief Complaint  Patient presents with  . Pre-op Exam    Seen for Dr. Tamala Julian    History of Present Illness: Mariah Burns is a 73 y.o. female who presents today for a pre op clearance visit. Seen for Dr. Tamala Julian.   She has a history of mild LVH, sarcoidosis, mild pulmonary hypertension, hypertension, hyperlipidemia, and diabetes.  She was last seen here by Dr. Tamala Julian back in May of 2019- DOE was stable at that time. She was exercising with water aerobics regularly.   Patient screened for recent travel, fever, URI symptoms and shortness of breath. Patient denies travel over the last 14 days and are currently without symptoms.    Comes in today. Here alone. Needing right breast lumpectomy - not scheduled yet. She is doing well from our standpoint. No chest pain. Breathing is actually improved - she is walking - has to stop less. She is not doing her actual water class but is walking more. She says she can walk a longer distance before she has to rest.  Not dizzy. No palpitations. No syncope. She can walk around the block and can go up a flight of stairs. Still doing her housework and shopping without issue. Overall, she has no real concerns. Recent labs noted. She follows with pulmonary just once a year. She does not take aspirin regularly.   Past Medical History:  Diagnosis Date  . Hyperlipidemia   . Hypertension   . OSA (obstructive sleep apnea)    NPSG 03/08/2010  AHI 19.8/hr   . Osteopenia   . 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 Type 2 diabetes mellitus ([place])     Past Surgical History:  Procedure Laterality Date  . BRONCHOSCOPY     dx'd sarcoid  . KNEE ARTHROPLASTY Left 03/13/2017  .  NEUROPLASTY / TRANSPOSITION ULNAR NERVE AT ELBOW  09/2011  . TOTAL ABDOMINAL HYSTERECTOMY       Medications: Current Meds  Medication Sig  . aspirin 81 MG chewable tablet Chew by mouth daily. Not taking daily  . [place] Oil-Levomenthol (FDGARD PO) Take 2 tablets by mouth as needed (upset stomach).  . Cholecalciferol (VITAMIN D-3 PO) Take 2,000 Units by mouth daily.  . clotrimazole (LOTRIMIN) 1 % cream Apply 1 application topically as needed (itching).  Marland Kitchen co-enzyme Q-10 30 MG capsule Take 30 mg by mouth daily.  . felodipine (PLENDIL) 10 MG 24 hr tablet TAKE 1 TABLET BY MOUTH EVERY DAY  . fish oil-omega-3 fatty acids 1000 MG capsule Take 2 capsules by mouth daily.    Marland Kitchen GLUCOSAMINE-CHONDROITIN PO Take 2 tablets by mouth daily.    Marland Kitchen L-Methylfolate-Algae-B12-B6 (METANX) 3-90.314-2-35 MG CAPS Take 1 capsule by mouth 2 (two) times daily.   . Menthol, Topical Analgesic, (BIOFREEZE ROLL-ON EX) Apply 1 application topically as needed (pain in back,neck,thumb).  . Misc Natural Products (TART CHERRY ADVANCED PO) Take 2 tablets by mouth daily.  . nitroGLYCERIN (NITROSTAT) 0.4 MG SL tablet Place 1 tablet (0.4 mg total) under the tongue every 5 (five) minutes as needed for chest pain.  . Polyvinyl Alcohol-Povidone PF (REFRESH) 1.4-0.6 % SOLN Place 1 drop into both eyes daily as needed (  dry eyes).   . pravastatin (PRAVACHOL) 40 MG tablet Take 1 capsule by mouth on Monday, Wednesday, Friday  . [DISCONTINUED] Liniments (BLUE-EMU SUPER STRENGTH EX) Apply 1 application topically daily as needed (pain).      Allergies: Allergies  Allergen Reactions  . Fosamax [Alendronate Sodium] Other (See Comments)    REACTION: "CAUSE GI UPSET AND FATIGUE"  . Omeprazole Other (See Comments)    Lower ab pain  . Motrin [Ibuprofen] Other (See Comments)    GI upset- has to eat prior to taking  . Fluvastatin Other (See Comments)    mild memoryproblems of confusion     Social History: The patient  reports that she has  never smoked. She has never used smokeless tobacco. She reports that she does not drink alcohol or use drugs.   Family History: The patient's family history includes Allergies in her daughter, mother, and sister; Gout in her brother and brother; Healthy in her daughter and daughter; Heart disease in her brother, sister, and sister; Heart failure in her father; Hypertension in her brother, brother, brother, and brother; Lupus in her sister; Post-traumatic stress disorder in her brother.   Review of Systems: Please see the history of present illness.   Otherwise, the review of systems is positive for none.   All other systems are reviewed and negative.   Physical Exam: VS:  BP (!) 142/58 (BP Location: Left Arm, Patient Position: Sitting, Cuff Size: Normal)   Pulse (!) 59   Ht 4\' 11"  (1.499 m)   Wt 152 lb (68.9 kg)   BMI 30.70 kg/m  .  BMI Body mass index is 30.7 kg/m.  Wt Readings from Last 3 Encounters:  08/19/18 152 lb (68.9 kg)  08/13/18 150 lb 1.6 oz (68.1 kg)  06/30/18 148 lb 9.6 oz (67.4 kg)   BP by me is 140/70  General: Pleasant. Well developed, well nourished and in no acute distress.   HEENT: Normal.  Neck: Supple, no JVD, carotid bruits, or masses noted.  Cardiac: Regular rate and rhythm. No murmurs, rubs, or gallops. No edema.  Respiratory:  Lungs are clear to auscultation bilaterally with normal work of breathing.  GI: Soft and nontender.  MS: No deformity or atrophy. Gait and ROM intact.  Skin: Warm and dry. Color is normal.  Neuro:  Strength and sensation are intact and no gross focal deficits noted.  Psych: Alert, appropriate and with normal affect.   LABORATORY DATA:  EKG:  EKG is ordered today. This demonstrates NSR with RBBB - unchanged.  Lab Results  Component Value Date   WBC 4.6 08/13/2018   HGB 12.0 08/13/2018   HCT 37.4 08/13/2018   PLT 204 08/13/2018   GLUCOSE 110 (H) 08/13/2018   CHOL 172 06/30/2018   TRIG 67 06/30/2018   HDL 54 06/30/2018    LDLCALC 105 (H) 06/30/2018   ALT 14 08/13/2018   AST 18 08/13/2018   NA 143 08/13/2018   K 3.5 08/13/2018   CL 107 08/13/2018   CREATININE 0.91 08/13/2018   BUN 12 08/13/2018   CO2 26 08/13/2018   HGBA1C 6.0 (H) 05/15/2018     BNP (last 3 results) No results for input(s): BNP in the last 8760 hours.  ProBNP (last 3 results) No results for input(s): PROBNP in the last 8760 hours.   Other Studies Reviewed Today:  Echo Study Conclusions 2016  - Left ventricle: The cavity size was normal. Wall thickness was   increased in a pattern of mild LVH.  Systolic function was   vigorous. The estimated ejection fraction was in the range of 65%   to 70%. Wall motion was normal; there were no regional wall   motion abnormalities. Doppler parameters are consistent with   abnormal left ventricular relaxation (grade 1 diastolic   dysfunction). - Aortic valve: There was no stenosis. There was trivial   regurgitation. - Mitral valve: There was trivial regurgitation. - Left atrium: The atrium was mildly dilated. - Right ventricle: The cavity size was normal. Systolic function   was normal. - Tricuspid valve: Peak RV-RA gradient (S): 39 mm Hg. - Pulmonary arteries: PA peak pressure: 42 mm Hg (S). - Inferior vena cava: The vessel was normal in size. The   respirophasic diameter changes were in the normal range (= 50%),   consistent with normal central venous pressure. - Pericardium, extracardiac: Small circumferential pericardial   effusion.  Impressions:  - Normal LV size with mild LV hypertrophy. EF 65-70%. Normal RV   size and systolic function. No significant valvular   abnormalities. Small pericardial effusion. Mild pulmonary   hypertension.  Assessment/Plan:  1. Pre op clearance for breast surgery - ok to proceed from our standpoint. No active cardiac issues. Will be available as needed. Ok to hold aspirin for 5 days.   2. Breast cancer - see above.   3. Sarcoid - followed  by pulmonary - no active issues noted.   4. HTN - BP is ok - typically lower at home. For now, no changes.   5. Pulmonary HTN - mild by last echo - her shortness of breath has improved with exercise.  Current medicines are reviewed with the patient today.  The patient does not have concerns regarding medicines other than what has been noted above.  The following changes have been made:  See above.  Labs/ tests ordered today include:    Orders Placed This Encounter  Procedures  . EKG 12-Lead     Disposition:   FU with Dr. Tamala Julian in 6 months.   Patient is agreeable to this plan and will call if any problems develop in the interim.   SignedTruitt Merle, NP  08/19/2018 9:49 AM  [facility] [address] [place], [place]  [address] Phone: [phone] Fax: [phone]

## 2018-08-19 NOTE — Progress Notes (Signed)
[employer] Psychosocial Distress Screening Spiritual Care  Met with Mariah Burns in [facility] to introduce [facility] team/resources, reviewing distress screen per protocol.  The patient scored a 5 on the Psychosocial Distress Thermometer which indicates moderate distress. Also assessed for distress and other psychosocial needs.   ONCBCN DISTRESS SCREENING 08/19/2018  Screening Type Initial Screening  Distress experienced in past week (1-10) 5  Family Problem type Other (comment)  Emotional problem type Depression;Nervousness/Anxiety;Isolation/feeling alone;Feeling hopeless;Adjusting to appearance changes  Spiritual/Religous concerns type Relating to God;Loss of Faith  Information Concerns Type Lack of info about treatment;Lack of info about complementary therapy choices  Physical Problem type Sleep/insomnia;Mouth sores/swallowing;Tingling hands/feet  Referral to support programs Yes   The pt presented to Breast Clinic with her daughter, who she expressed was a strong support in her life. The pt shared that her distress remains at a 5 due to still processing the information she received today. The pt expressed that she has felt like a "loner" throughout her life, and she has difficulty opening up to others due to her privacy not always being respected in the past. The pt indicated interest in speaking with a confidential support, so she was referred to [place], Lorrin Jackson and counseling services at [facility]. The pt reported that she will consider these referrals and reach out if she needs any support.   Social work and counselor encounters opened on 08/13/2018 can be disregarded- these were entered in error.  Follow up needed: No.  Doris Cheadle, Counseling Intern [phone]

## 2018-08-19 NOTE — Patient Instructions (Addendum)
We will be checking the following labs today - NONE   Medication Instructions:    Continue with your current medicines.    If you need a refill on your cardiac medications before your next appointment, please call your pharmacy.     Testing/Procedures To Be Arranged:  N/A  Follow-Up:   See Dr. Tamala Julian in 6 months (ok to move appointment)    At [facility], you and your health needs are our priority.  As part of our continuing mission to provide you with exceptional heart care, we have created designated Provider Care Teams.  These Care Teams include your primary Cardiologist (physician) and Advanced Practice Providers (APPs -  Physician Assistants and Nurse Practitioners) who all work together to provide you with the care you need, when you need it.  Special Instructions:  . I will send a note to Dr. Excell Seltzer  Call the [place] office at [phone] if you have any questions, problems or concerns.

## 2018-08-20 ENCOUNTER — Ambulatory Visit: Payer: Self-pay | Admitting: General Surgery

## 2018-08-20 DIAGNOSIS — Z170 Estrogen receptor positive status [ER+]: Secondary | ICD-10-CM

## 2018-08-20 DIAGNOSIS — C50411 Malignant neoplasm of upper-outer quadrant of right female breast: Secondary | ICD-10-CM

## 2018-08-27 ENCOUNTER — Other Ambulatory Visit: Payer: Self-pay

## 2018-08-27 ENCOUNTER — Encounter (HOSPITAL_BASED_OUTPATIENT_CLINIC_OR_DEPARTMENT_OTHER): Payer: Self-pay | Admitting: *Deleted

## 2018-08-29 NOTE — Progress Notes (Signed)
Gatorade and surgical soap given with instructions, pt verbalized understanding.

## 2018-09-01 NOTE — Anesthesia Preprocedure Evaluation (Addendum)
Anesthesia Evaluation  Patient identified by MRN, date of birth, ID band Patient awake    Reviewed: Allergy & Precautions, NPO status , Patient's Chart, lab work & pertinent test results  Airway Mallampati: II  TM Distance: >3 FB Neck ROM: Full    Dental no notable dental hx. (+) Teeth Intact, Dental Advisory Given   Pulmonary sleep apnea and Continuous Positive Airway Pressure Ventilation ,  Sarcoidosis: normal PFTs 2017   Pulmonary exam normal breath sounds clear to auscultation       Cardiovascular hypertension, Pt. on medications negative cardio ROS Normal cardiovascular exam Rhythm:Regular Rate:Normal  TTE 2016 Normal EF, valves normal, mild pulmonary HTN   Neuro/Psych PSYCHIATRIC DISORDERS Anxiety negative neurological ROS     GI/Hepatic Neg liver ROS, GERD  Medicated and Controlled,  Endo/Other  diabetes, Well Controlled  Renal/GU Renal disease  negative genitourinary   Musculoskeletal negative musculoskeletal ROS (+)   Abdominal   Peds  Hematology negative hematology ROS (+)   Anesthesia Other Findings Right breast cancer  Reproductive/Obstetrics                           Anesthesia Physical Anesthesia Plan  ASA: II  Anesthesia Plan: General and Regional   Post-op Pain Management:  Regional for Post-op pain   Induction: Intravenous  PONV Risk Score and Plan: 3 and Ondansetron, Dexamethasone and Treatment may vary due to age or medical condition  Airway Management Planned: LMA  Additional Equipment:   Intra-op Plan:   Post-operative Plan: Extubation in OR  Informed Consent: I have reviewed the patients History and Physical, chart, labs and discussed the procedure including the risks, benefits and alternatives for the proposed anesthesia with the patient or authorized representative who has indicated his/her understanding and acceptance.     Dental advisory  given  Plan Discussed with: CRNA  Anesthesia Plan Comments:         Anesthesia Quick Evaluation

## 2018-09-02 ENCOUNTER — Encounter (HOSPITAL_BASED_OUTPATIENT_CLINIC_OR_DEPARTMENT_OTHER)
Admission: RE | Disposition: A | Discharge status: Home / Self Care | Payer: Self-pay | Source: Home / Self Care | Attending: General Surgery

## 2018-09-02 ENCOUNTER — Other Ambulatory Visit (HOSPITAL_COMMUNITY): Payer: Medicare Other

## 2018-09-02 ENCOUNTER — Ambulatory Visit (HOSPITAL_BASED_OUTPATIENT_CLINIC_OR_DEPARTMENT_OTHER): Payer: Medicare Other | Admitting: Anesthesiology

## 2018-09-02 ENCOUNTER — Ambulatory Visit (HOSPITAL_COMMUNITY): Payer: Medicare Other

## 2018-09-02 ENCOUNTER — Encounter (HOSPITAL_BASED_OUTPATIENT_CLINIC_OR_DEPARTMENT_OTHER): Payer: Self-pay | Admitting: Anesthesiology

## 2018-09-02 ENCOUNTER — Ambulatory Visit (HOSPITAL_BASED_OUTPATIENT_CLINIC_OR_DEPARTMENT_OTHER)
Admission: RE | Admit: 2018-09-02 | Discharge: 2018-09-02 | Disposition: A | Discharge status: Home / Self Care | Payer: Medicare Other | Attending: General Surgery | Admitting: General Surgery

## 2018-09-02 ENCOUNTER — Ambulatory Visit (HOSPITAL_COMMUNITY)
Admission: RE | Admit: 2018-09-02 | Discharge: 2018-09-02 | Disposition: A | Discharge status: Home / Self Care | Payer: Medicare Other | Source: Ambulatory Visit | Attending: General Surgery | Admitting: General Surgery

## 2018-09-02 ENCOUNTER — Other Ambulatory Visit: Payer: Self-pay

## 2018-09-02 ENCOUNTER — Encounter: Payer: Self-pay | Admitting: Hematology

## 2018-09-02 DIAGNOSIS — Z170 Estrogen receptor positive status [ER+]: Secondary | ICD-10-CM | POA: Insufficient documentation

## 2018-09-02 DIAGNOSIS — Z9981 Dependence on supplemental oxygen: Secondary | ICD-10-CM | POA: Diagnosis not present

## 2018-09-02 DIAGNOSIS — D869 Sarcoidosis, unspecified: Secondary | ICD-10-CM | POA: Insufficient documentation

## 2018-09-02 DIAGNOSIS — I10 Essential (primary) hypertension: Secondary | ICD-10-CM | POA: Diagnosis not present

## 2018-09-02 DIAGNOSIS — C773 Secondary and unspecified malignant neoplasm of axilla and upper limb lymph nodes: Secondary | ICD-10-CM | POA: Insufficient documentation

## 2018-09-02 DIAGNOSIS — C50411 Malignant neoplasm of upper-outer quadrant of right female breast: Secondary | ICD-10-CM

## 2018-09-02 DIAGNOSIS — G4730 Sleep apnea, unspecified: Secondary | ICD-10-CM | POA: Insufficient documentation

## 2018-09-02 DIAGNOSIS — E119 Type 2 diabetes mellitus without complications: Secondary | ICD-10-CM | POA: Insufficient documentation

## 2018-09-02 HISTORY — DX: Prediabetes: R73.03

## 2018-09-02 HISTORY — PX: BREAST LUMPECTOMY WITH RADIOACTIVE SEED AND SENTINEL LYMPH NODE BIOPSY: SHX6550

## 2018-09-02 HISTORY — DX: Malignant (primary) neoplasm, unspecified: C80.1

## 2018-09-02 HISTORY — DX: Anxiety disorder, unspecified: F41.9

## 2018-09-02 LAB — GLUCOSE, CAPILLARY
GLUCOSE-CAPILLARY: 109 mg/dL — AB (ref 70–99)
Glucose-Capillary: 121 mg/dL — ABNORMAL HIGH (ref 70–99)

## 2018-09-02 SURGERY — BREAST LUMPECTOMY WITH RADIOACTIVE SEED AND SENTINEL LYMPH NODE BIOPSY
Anesthesia: Regional | Site: Breast | Laterality: Right

## 2018-09-02 MED ORDER — LACTATED RINGERS IV SOLN
INTRAVENOUS | Status: DC
  Administered 2018-09-02: 09:00:00 via INTRAVENOUS

## 2018-09-02 MED ORDER — TECHNETIUM TC 99M SULFUR COLLOID FILTERED
1.0000 | Freq: Once | INTRAVENOUS | Status: AC | PRN
  Administered 2018-09-02: 1 via INTRADERMAL

## 2018-09-02 MED ORDER — PROPOFOL 10 MG/ML IV BOLUS
INTRAVENOUS | Status: DC | PRN
  Administered 2018-09-02: 150 mg via INTRAVENOUS

## 2018-09-02 MED ORDER — BUPIVACAINE-EPINEPHRINE 0.25% -1:200000 IJ SOLN
INTRAMUSCULAR | Status: DC | PRN
  Administered 2018-09-02: 1 mL
  Administered 2018-09-02: 19 mL

## 2018-09-02 MED ORDER — LIDOCAINE 2% (20 MG/ML) 5 ML SYRINGE
INTRAMUSCULAR | Status: AC
  Filled 2018-09-02: qty 5

## 2018-09-02 MED ORDER — DEXAMETHASONE SODIUM PHOSPHATE 10 MG/ML IJ SOLN
INTRAMUSCULAR | Status: AC
  Filled 2018-09-02: qty 1

## 2018-09-02 MED ORDER — FENTANYL CITRATE (PF) 100 MCG/2ML IJ SOLN
INTRAMUSCULAR | Status: AC
  Filled 2018-09-02: qty 2

## 2018-09-02 MED ORDER — CEFAZOLIN SODIUM-DEXTROSE 2-4 GM/100ML-% IV SOLN
INTRAVENOUS | Status: AC
  Filled 2018-09-02: qty 100

## 2018-09-02 MED ORDER — 0.9 % SODIUM CHLORIDE (POUR BTL) OPTIME
TOPICAL | Status: DC | PRN
  Administered 2018-09-02: 1000 mL

## 2018-09-02 MED ORDER — PROPOFOL 10 MG/ML IV BOLUS
INTRAVENOUS | Status: AC
  Filled 2018-09-02: qty 20

## 2018-09-02 MED ORDER — FENTANYL CITRATE (PF) 100 MCG/2ML IJ SOLN
INTRAMUSCULAR | Status: DC | PRN
  Administered 2018-09-02: 25 ug via INTRAVENOUS

## 2018-09-02 MED ORDER — EPHEDRINE 5 MG/ML INJ
INTRAVENOUS | Status: AC
  Filled 2018-09-02: qty 10

## 2018-09-02 MED ORDER — CHLORHEXIDINE GLUCONATE CLOTH 2 % EX PADS: 6.0000 | MEDICATED_PAD | Freq: Once | CUTANEOUS | Status: DC

## 2018-09-02 MED ORDER — FENTANYL CITRATE (PF) 100 MCG/2ML IJ SOLN
50.0000 ug | INTRAMUSCULAR | Status: DC | PRN
  Administered 2018-09-02: 50 ug via INTRAVENOUS

## 2018-09-02 MED ORDER — CLONIDINE HCL (ANALGESIA) 100 MCG/ML EP SOLN
EPIDURAL | Status: DC | PRN
  Administered 2018-09-02: 100 ug

## 2018-09-02 MED ORDER — CEFAZOLIN SODIUM-DEXTROSE 2-4 GM/100ML-% IV SOLN
2.0000 g | INTRAVENOUS | Status: AC
  Administered 2018-09-02: 2 g via INTRAVENOUS

## 2018-09-02 MED ORDER — ROPIVACAINE HCL 5 MG/ML IJ SOLN
INTRAMUSCULAR | Status: DC | PRN
  Administered 2018-09-02: 30 mL via PERINEURAL

## 2018-09-02 MED ORDER — KETOROLAC TROMETHAMINE 30 MG/ML IJ SOLN
INTRAMUSCULAR | Status: AC
  Filled 2018-09-02: qty 1

## 2018-09-02 MED ORDER — MIDAZOLAM HCL 2 MG/2ML IJ SOLN
INTRAMUSCULAR | Status: AC
  Filled 2018-09-02: qty 2

## 2018-09-02 MED ORDER — FENTANYL CITRATE (PF) 100 MCG/2ML IJ SOLN: 25.0000 ug | INTRAMUSCULAR | Status: DC | PRN

## 2018-09-02 MED ORDER — SCOPOLAMINE 1 MG/3DAYS TD PT72
1.0000 | MEDICATED_PATCH | Freq: Once | TRANSDERMAL | Status: AC | PRN
  Administered 2018-09-02: 1 via TRANSDERMAL

## 2018-09-02 MED ORDER — ONDANSETRON HCL 4 MG/2ML IJ SOLN
INTRAMUSCULAR | Status: DC | PRN
  Administered 2018-09-02: 4 mg via INTRAVENOUS

## 2018-09-02 MED ORDER — ACETAMINOPHEN 500 MG PO TABS
ORAL_TABLET | ORAL | Status: AC
  Filled 2018-09-02: qty 2

## 2018-09-02 MED ORDER — LIDOCAINE 2% (20 MG/ML) 5 ML SYRINGE
INTRAMUSCULAR | Status: DC | PRN
  Administered 2018-09-02: 50 mg via INTRAVENOUS

## 2018-09-02 MED ORDER — GABAPENTIN 300 MG PO CAPS
300.0000 mg | ORAL_CAPSULE | ORAL | Status: AC
  Administered 2018-09-02: 300 mg via ORAL

## 2018-09-02 MED ORDER — MIDAZOLAM HCL 2 MG/2ML IJ SOLN
1.0000 mg | INTRAMUSCULAR | Status: DC | PRN
  Administered 2018-09-02: 1 mg via INTRAVENOUS

## 2018-09-02 MED ORDER — TRAMADOL HCL 50 MG PO TABS: 50.0000 mg | ORAL_TABLET | Freq: Four times a day (QID) | ORAL | 1 refills | Status: DC | PRN

## 2018-09-02 MED ORDER — ONDANSETRON HCL 4 MG/2ML IJ SOLN
INTRAMUSCULAR | Status: AC
  Filled 2018-09-02: qty 2

## 2018-09-02 MED ORDER — DEXAMETHASONE SODIUM PHOSPHATE 10 MG/ML IJ SOLN
INTRAMUSCULAR | Status: DC | PRN
  Administered 2018-09-02: 10 mg via INTRAVENOUS

## 2018-09-02 MED ORDER — ACETAMINOPHEN 500 MG PO TABS
1000.0000 mg | ORAL_TABLET | ORAL | Status: AC
  Administered 2018-09-02: 1000 mg via ORAL

## 2018-09-02 MED ORDER — SCOPOLAMINE 1 MG/3DAYS TD PT72
MEDICATED_PATCH | TRANSDERMAL | Status: AC
  Filled 2018-09-02: qty 1

## 2018-09-02 MED ORDER — EPHEDRINE SULFATE 50 MG/ML IJ SOLN
INTRAMUSCULAR | Status: DC | PRN
  Administered 2018-09-02 (×2): 10 mg via INTRAVENOUS

## 2018-09-02 MED ORDER — GABAPENTIN 300 MG PO CAPS
ORAL_CAPSULE | ORAL | Status: AC
  Filled 2018-09-02: qty 1

## 2018-09-02 SURGICAL SUPPLY — 50 items
ADH SKN CLS APL DERMABOND .7 (GAUZE/BANDAGES/DRESSINGS) ×1
APL PRP STRL LF DISP 70% ISPRP (MISCELLANEOUS) ×1
BINDER BREAST LRG (GAUZE/BANDAGES/DRESSINGS) IMPLANT
BINDER BREAST MEDIUM (GAUZE/BANDAGES/DRESSINGS) IMPLANT
BINDER BREAST XLRG (GAUZE/BANDAGES/DRESSINGS) IMPLANT
BINDER BREAST XXLRG (GAUZE/BANDAGES/DRESSINGS) ×1 IMPLANT
BLADE SURG 15 STRL LF DISP TIS (BLADE) ×1 IMPLANT
BLADE SURG 15 STRL SS (BLADE) ×2
CANISTER SUC SOCK COL 7IN (MISCELLANEOUS) IMPLANT
CANISTER SUCT 1200ML W/VALVE (MISCELLANEOUS) IMPLANT
CHLORAPREP W/TINT 26 (MISCELLANEOUS) ×2 IMPLANT
CLIP VESOCCLUDE MED 6/CT (CLIP) IMPLANT
CLIP VESOCCLUDE SM WIDE 6/CT (CLIP) ×1 IMPLANT
COVER BACK TABLE REUSABLE LG (DRAPES) ×2 IMPLANT
COVER MAYO STAND REUSABLE (DRAPES) ×2 IMPLANT
COVER PROBE W GEL 5X96 (DRAPES) ×2 IMPLANT
COVER WAND RF STERILE (DRAPES) IMPLANT
DECANTER SPIKE VIAL GLASS SM (MISCELLANEOUS) ×2 IMPLANT
DERMABOND ADVANCED (GAUZE/BANDAGES/DRESSINGS) ×1
DERMABOND ADVANCED .7 DNX12 (GAUZE/BANDAGES/DRESSINGS) ×1 IMPLANT
DRAPE LAPAROSCOPIC ABDOMINAL (DRAPES) ×2 IMPLANT
DRAPE UTILITY XL STRL (DRAPES) ×2 IMPLANT
ELECT COATED BLADE 2.86 ST (ELECTRODE) ×2 IMPLANT
ELECT REM PT RETURN 9FT ADLT (ELECTROSURGICAL) ×2
ELECTRODE REM PT RTRN 9FT ADLT (ELECTROSURGICAL) ×1 IMPLANT
GLOVE BIOGEL PI IND STRL 8 (GLOVE) ×1 IMPLANT
GLOVE BIOGEL PI INDICATOR 8 (GLOVE) ×1
GLOVE ECLIPSE 7.5 STRL STRAW (GLOVE) ×2 IMPLANT
GOWN STRL REUS W/ TWL LRG LVL3 (GOWN DISPOSABLE) ×1 IMPLANT
GOWN STRL REUS W/ TWL XL LVL3 (GOWN DISPOSABLE) ×1 IMPLANT
GOWN STRL REUS W/TWL LRG LVL3 (GOWN DISPOSABLE) ×2
GOWN STRL REUS W/TWL XL LVL3 (GOWN DISPOSABLE) ×2
ILLUMINATOR WAVEGUIDE N/F (MISCELLANEOUS) IMPLANT
KIT MARKER MARGIN INK (KITS) ×2 IMPLANT
NDL HYPO 25X1 1.5 SAFETY (NEEDLE) ×2 IMPLANT
NDL SAFETY ECLIPSE 18X1.5 (NEEDLE) ×1 IMPLANT
NEEDLE HYPO 18GX1.5 SHARP (NEEDLE) ×2
NEEDLE HYPO 25X1 1.5 SAFETY (NEEDLE) ×4 IMPLANT
NS IRRIG 1000ML POUR BTL (IV SOLUTION) IMPLANT
PACK BASIN DAY SURGERY FS (CUSTOM PROCEDURE TRAY) ×2 IMPLANT
PENCIL BUTTON HOLSTER BLD 10FT (ELECTRODE) ×2 IMPLANT
SLEEVE SCD COMPRESS KNEE MED (MISCELLANEOUS) ×2 IMPLANT
SPONGE LAP 4X18 RFD (DISPOSABLE) ×2 IMPLANT
SUT MON AB 5-0 PS2 18 (SUTURE) ×2 IMPLANT
SUT VICRYL 3-0 CR8 SH (SUTURE) ×3 IMPLANT
SYR CONTROL 10ML LL (SYRINGE) ×4 IMPLANT
TOWEL GREEN STERILE FF (TOWEL DISPOSABLE) ×2 IMPLANT
TRAY FAXITRON CT DISP (TRAY / TRAY PROCEDURE) ×2 IMPLANT
TUBE CONNECTING 20X1/4 (TUBING) IMPLANT
YANKAUER SUCT BULB TIP NO VENT (SUCTIONS) IMPLANT

## 2018-09-02 NOTE — Op Note (Signed)
Preoperative Diagnosis: RIGHT BREAST CANCER  Postoprative Diagnosis: RIGHT BREAST CANCER  Procedure: Procedure(s): RIGHT BREAST LUMPECTOMY WITH RADIOACTIVE SEED AND  RIGHT SENTINEL LYMPH NODE BIOPSY   Surgeon: Excell Seltzer T   Assistants: none  Anesthesia:  General LMA anesthesia  Indications: 73 year old female with a new diagnosis of cancer of the right breast, upper outer quadrant. Clinical stage 1B, ER positive, PR positive, HER-2 negative, primary tumor 1.7 cm.  After extensive preoperative workup and discussion detailed elsewhere we have elected to proceed with radial rectus is localized right breast lumpectomy and axillary sentinel lymph node biopsy was initial surgical therapy.    Procedure Detail:  See placement was confirmed in the holding area with the neoprobe. She underwent injection of 1 mCi of technetium sulfur colloidin intradermally around the right nipple in the holding area. She underwent a pectoral block by anesthesia. Patient was then take to the operating room, placed in the supine position on the operating table, and laryngeal mask general anesthesia induced. She is carefully positioned in the right arm extended and the entire right anterior chest and axilla and upper arm were widely sterilely prepped and draped. She received preoperative IV antibiotics. PAS were placed. Patient timeout was performed and correct procedure verified. The lumpectomy was approached initially. This he is localized in the lateral right breast several centimeters off the areolar edge. A circumareolar incision was used and the skin subcutaneous flap raised laterally over the area of high counts. Using the neoprobe for guidance in approximately 4 cm globular specimen of breast tissue was excised around proceed. There was a small palpable mass. The specimen was removed and intact for margins. 3-D specimen mammography showed the seat and marking clip and tumor contained within the specimen. This  was sent for pathology. Complete hemostasis was obtained to the lumpectomy cavity. The cavity was marked with clips. The deep breast except and his tissue was closed with interrupted 3-0 Vicryl. Attention was turned to the axilla. The knee a probe was used to locate a hot area of the axilla and a small transverse incision made. Dissection was deepened down to the separate tendinous tissue and the clavipectoral fascia cautery. Using the neoprobe for guidance I dissected down onto a slightly enlarged but soft with no markedly elevated counts. This was completely excised with cautery and the ex vivo head counseled about 650. I was able to identify to further lymph nodes with somewhat elevated counts without 100 and about the 50 and each was excised with cautery, soft and normal size. At this point there was no palpable adenopathy and minimal background counts within the axilla. Hemostasis was assured and the deep axillary and subtenon's tissue closed with interrupted 3-0 Vicryl. The skin incisions closed with subcuticular 5-0 Monocryl and Dermabond. Sponge needle and this may counts were correct.    Findings: As above  Estimated Blood Loss:  less than 50 mL         Drains: none  Blood Given: none          Specimens: #1 right breast lumpectomy   #2 right axillary sentinel lymph nodes 3        Complications:  * No complications entered in OR log *         Disposition: PACU - hemodynamically stable.         Condition: stable

## 2018-09-02 NOTE — Transfer of Care (Signed)
Immediate Anesthesia Transfer of Care Note  Patient: Mariah Burns  Procedure(s) Performed: RIGHT BREAST LUMPECTOMY WITH RADIOACTIVE SEED AND  RIGHT SENTINEL LYMPH NODE BIOPSY (Right Breast)  Patient Location: PACU  Anesthesia Type:General  Level of Consciousness: sedated and patient cooperative  Airway & Oxygen Therapy: Patient Spontanous Breathing and Patient connected to face mask oxygen  Post-op Assessment: Report given to RN and Post -op Vital signs reviewed and stable  Post vital signs: Reviewed and stable  Last Vitals:  Vitals Value Taken Time  BP 136/73 09/02/2018 12:23 PM  Temp    Pulse 72 09/02/2018 12:24 PM  Resp 12 09/02/2018 12:24 PM  SpO2 100 % 09/02/2018 12:24 PM  Vitals shown include unvalidated device data.  Last Pain:  Vitals:   09/02/18 0842  TempSrc: Oral  PainSc: 0-No pain      Patients Stated Pain Goal: 8 (17/91/50 5697)  Complications: No apparent anesthesia complications

## 2018-09-02 NOTE — Interval H&P Note (Signed)
History and Physical Interval Note:  09/02/2018 9:43 AM  Mariah Burns  has presented today for surgery, with the diagnosis of RIGHT BREAST CANCER.  The various methods of treatment have been discussed with the patient and family. After consideration of risks, benefits and other options for treatment, the patient has consented to  Procedure(s): RIGHT BREAST LUMPECTOMY WITH RADIOACTIVE SEED AND  RIGHT SENTINEL LYMPH NODE BIOPSY (Right) as a surgical intervention.  The patient's history has been reviewed, patient examined, no change in status, stable for surgery.  I have reviewed the patient's chart and labs.  Questions were answered to the patient's satisfaction.     Darene Lamer [name]

## 2018-09-02 NOTE — Progress Notes (Signed)
Provided emotional support during nuclear medicine injections.

## 2018-09-02 NOTE — Discharge Instructions (Signed)
[facility] °Office Phone Number [phone] ° °BREAST BIOPSY/ PARTIAL MASTECTOMY: POST OP INSTRUCTIONS ° °Always review your discharge instruction sheet given to you by the facility where your surgery was performed. ° °IF YOU HAVE DISABILITY OR FAMILY LEAVE FORMS, YOU MUST BRING THEM TO THE OFFICE FOR PROCESSING.  DO NOT GIVE THEM TO YOUR DOCTOR. ° °1. A prescription for pain medication may be given to you upon discharge.  Take your pain medication as prescribed, if needed.  If narcotic pain medicine is not needed, then you may take acetaminophen (Tylenol) or ibuprofen (Advil) as needed. °2. Take your usually prescribed medications unless otherwise directed °3. If you need a refill on your pain medication, please contact your pharmacy.  They will contact our office to request authorization.  Prescriptions will not be filled after 5pm or on week-ends. °4. You should eat very light the first 24 hours after surgery, such as soup, crackers, pudding, etc.  Resume your normal diet the day after surgery. °5. Most patients will experience some swelling and bruising in the breast.  Ice packs and a good support bra will help.  Swelling and bruising can take several days to resolve.  °6. It is common to experience some constipation if taking pain medication after surgery.  Increasing fluid intake and taking a stool softener will usually help or prevent this problem from occurring.  A mild laxative (Milk of Magnesia or Miralax) should be taken according to package directions if there are no bowel movements after 48 hours. °7. Unless discharge instructions indicate otherwise, you may remove your bandages 24-48 hours after surgery, and you may shower at that time.  You may have steri-strips (small skin tapes) in place directly over the incision.  These strips should be left on the skin for 7-10 days.  If your surgeon used skin glue on the incision, you may shower in 24 hours.  The glue will flake off over the  next 2-3 weeks.  Any sutures or staples will be removed at the office during your follow-up visit. °8. ACTIVITIES:  You may resume regular daily activities (gradually increasing) beginning the next day.  Wearing a good support bra or sports bra minimizes pain and swelling.  You may have sexual intercourse when it is comfortable. °a. You may drive when you no longer are taking prescription pain medication, you can comfortably wear a seatbelt, and you can safely maneuver your car and apply brakes. °b. RETURN TO WORK:  ______________________________________________________________________________________ °9. You should see your doctor in the office for a follow-up appointment approximately two weeks after your surgery.  Your doctor’s nurse will typically make your follow-up appointment when she calls you with your pathology report.  Expect your pathology report 2-3 business days after your surgery.  You may call to check if you do not hear from us after three days. °10. OTHER INSTRUCTIONS: _______________________________________________________________________________________________ _____________________________________________________________________________________________________________________________________ °_____________________________________________________________________________________________________________________________________ °_____________________________________________________________________________________________________________________________________ ° °WHEN TO CALL YOUR DOCTOR: °1. Fever over 101.0 °2. Nausea and/or vomiting. °3. Extreme swelling or bruising. °4. Continued bleeding from incision. °5. Increased pain, redness, or drainage from the incision. ° °The clinic staff is available to answer your questions during regular business hours.  Please don’t hesitate to call and ask to speak to one of the nurses for clinical concerns.  If you have a medical emergency, go to the nearest  emergency room or call 911.  A surgeon from [facility] is always on call at the hospital. ° °For further questions, please visit [email or web address]  ° ° ° ° °  Post Anesthesia Home Care Instructions ° °Activity: °Get plenty of rest for the remainder of the day. A responsible individual must stay with you for 24 hours following the procedure.  °For the next 24 hours, DO NOT: °-Drive a car °-Operate machinery °-Drink alcoholic beverages °-Take any medication unless instructed by your physician °-Make any legal decisions or sign important papers. ° °Meals: °Start with liquid foods such as gelatin or soup. Progress to regular foods as tolerated. Avoid greasy, spicy, heavy foods. If nausea and/or vomiting occur, drink only clear liquids until the nausea and/or vomiting subsides. Call your physician if vomiting continues. ° °Special Instructions/Symptoms: °Your throat may feel dry or sore from the anesthesia or the breathing tube placed in your throat during surgery. If this causes discomfort, gargle with warm salt water. The discomfort should disappear within 24 hours. ° °If you had a scopolamine patch placed behind your ear for the management of post- operative nausea and/or vomiting: ° °1. The medication in the patch is effective for 72 hours, after which it should be removed.  Wrap patch in a tissue and discard in the trash. Wash hands thoroughly with soap and water. °2. You may remove the patch earlier than 72 hours if you experience unpleasant side effects which may include dry mouth, dizziness or visual disturbances. °3. Avoid touching the patch. Wash your hands with soap and water after contact with the patch. °  ° °

## 2018-09-02 NOTE — Progress Notes (Signed)
Assisted D. Woodrum with right, ultrasound guided, pectoralis block. Side rails up, monitors on throughout procedure. See vital signs in flow sheet. Tolerated Procedure well.

## 2018-09-02 NOTE — Anesthesia Procedure Notes (Signed)
Anesthesia Regional Block: Pectoralis block   Pre-Anesthetic Checklist: ,, timeout performed, Correct Patient, Correct Site, Correct Laterality, Correct Procedure, Correct Position, site marked, Risks and benefits discussed,  Surgical consent,  Pre-op evaluation,  At surgeon's request and post-op pain management  Laterality: Right  Prep: Maximum Sterile Barrier Precautions used, chloraprep       Needles:  Injection technique: Single-shot  Needle Type: Echogenic Stimulator Needle     Needle Length: 9cm  Needle Gauge: 22     Additional Needles:   Procedures:,,,, ultrasound used (permanent image in chart),,,,  Narrative:  Start time: 09/02/2018 10:11 AM End time: 09/02/2018 10:21 AM Injection made incrementally with aspirations every 5 mL.  Performed by: Personally  Anesthesiologist: Freddrick March, MD  Additional Notes: Monitors applied. No increased pain on injection. No increased resistance to injection. Injection made in 5cc increments. Good needle visualization. Patient tolerated procedure well.

## 2018-09-02 NOTE — H&P (Signed)
History of Present Illness Mariah Burns T. Shakirah Kirkey MD; 08/13/2018 3:16 PM) The patient is a 73 year old female who presents with breast cancer. She is a postmenopausal female referred by Dr. Emmit Pomfret for evaluation of recently diagnosed carcinoma of the right breast. She recently presented for a screening mamogram revealing and apparent lobulated mass in the upper outer right breast. Subsequent imaging included diagnostic mamogram confirming a 1.7 cm mass and ultrasound showing a hypoechoic irregular solid mass measuring 1.7 cm 9:00 position right breast 4 cm from the nipple. An ultrasound guided breast biopsy was performed on 08/04/2018 with pathology revealing invasive ductal carcinoma of the breast. She is seen now in breast multidisciplinary clinic for initial treatment planning. She has experienced no breast symptoms, specifically mass or pain or nipple discharge or skin changes. She does not have a personal history of any previous breast problems.  Findings at that time were the following: Tumor size: 1.7 cm Tumor grade: 12, Ki-67 10% Estrogen Receptor: +100% Progesterone Receptor: +100% Her-2 neu: negative Lymph node status: negative    Past Surgical History Tawni Pummel, RN; 08/13/2018 7:24 AM) Hysterectomy (due to cancer) - Partial  Knee Surgery  Left. Shoulder Surgery  Left.  Diagnostic Studies History Tawni Pummel, RN; 08/13/2018 7:24 AM) Colonoscopy  1-5 years ago Mammogram  within last year Pap Smear  1-5 years ago  Medication History Tawni Pummel, RN; 08/13/2018 7:24 AM) Medications Reconciled  Social History Tawni Pummel, RN; 08/13/2018 7:24 AM) Caffeine use  Coffee, Tea. No alcohol use  No drug use  Tobacco use  Never smoker.  Family History Tawni Pummel, RN; 08/13/2018 7:24 AM) Alcohol Abuse  Father. Arthritis  Father, Mother. Cerebrovascular Accident  Mother. Heart Disease  Father, Mother. Hypertension  Father,  Mother.  Pregnancy / Birth History Tawni Pummel, RN; 08/13/2018 7:24 AM) Age at menarche  56 years. Age of menopause  <44 Contraceptive History  Oral contraceptives. Gravida  3 Maternal age  6-25 Para  3  Other Problems Tawni Pummel, RN; 08/13/2018 7:24 AM) Anxiety Disorder  Arthritis  Back Pain  Depression  Diabetes Mellitus  Gastric Ulcer  Gastroesophageal Reflux Disease  Heart murmur  High blood pressure  Home Oxygen Use  Hypercholesterolemia  Lump In Breast  Sleep Apnea     Review of Systems Sunday Spillers Ledford RN; 08/13/2018 7:24 AM) General Present- Fatigue, Night Sweats and Weight Gain. Not Present- Appetite Loss, Chills, Fever and Weight Loss. Skin Present- Dryness. Not Present- Change in Wart/Mole, Hives, Jaundice, New Lesions, Non-Healing Wounds, Rash and Ulcer. HEENT Present- Seasonal Allergies and Wears glasses/contact lenses. Not Present- Earache, Hearing Loss, Hoarseness, Nose Bleed, Oral Ulcers, Ringing in the Ears, Sinus Pain, Sore Throat, Visual Disturbances and Yellow Eyes. Respiratory Present- Snoring. Not Present- Bloody sputum, Chronic Cough, Difficulty Breathing and Wheezing. Cardiovascular Not Present- Chest Pain, Difficulty Breathing Lying Down, Leg Cramps, Palpitations, Rapid Heart Rate, Shortness of Breath and Swelling of Extremities. Gastrointestinal Present- Gets full quickly at meals. Not Present- Abdominal Pain, Bloating, Bloody Stool, Change in Bowel Habits, Chronic diarrhea, Constipation, Difficulty Swallowing, Excessive gas, Hemorrhoids, Indigestion, Nausea, Rectal Pain and Vomiting. Female Genitourinary Present- Frequency and Nocturia. Not Present- Painful Urination, Pelvic Pain and Urgency. Musculoskeletal Present- Back Pain, Joint Pain, Joint Stiffness, Muscle Pain and Muscle Weakness. Not Present- Swelling of Extremities. Neurological Present- Decreased Memory and Tingling. Not Present- Fainting, Headaches, Numbness,  Seizures, Tremor, Trouble walking and Weakness. Psychiatric Present- Anxiety, Change in Sleep Pattern, Fearful and Frequent crying. Not Present- Bipolar and Depression. Endocrine Present-  Cold Intolerance, Excessive Hunger and Hot flashes. Not Present- Hair Changes, Heat Intolerance and New Diabetes.   Physical Exam Mariah Burns T. Sharika Mosquera MD; 08/13/2018 3:18 PM) The physical exam findings are as follows: Note:General: Alert, petite, pleasant older African-American female, in no distress Skin: Warm and dry without rash or infection. HEENT: No palpable masses or thyromegaly. Sclera nonicteric. Pupils equal round and reactive. Breasts: There is some bruising and thickening with possible palpable mass lateral right breast. No other palpable abnormalities. No skin changes or nipple crusting or inversion. No palpable axillary adenopathy. Lymph nodes: No cervical, supraclavicular, nodes palpable. Lungs: Breath sounds clear and equal. No wheezing or increased work of breathing. Cardiovascular: Regular rate and rhythm, 2/6 systolic murmer. No JVD or edema. Abdomen: Nondistended. Soft and nontender. No masses palpable. No organomegaly. No palpable hernias. Extremities: No edema or joint swelling or deformity. No chronic venous stasis changes. Neurologic: Alert and fully oriented. Gait normal. No focal weakness. Psychiatric: Normal mood and affect. Thought content appropriate with normal judgement and insight    Assessment & Plan Mariah Burns T. Osmany Azer MD; 08/13/2018 3:22 PM) MALIGNANT NEOPLASM OF RIGHT BREAST, STAGE 1, ESTROGEN RECEPTOR POSITIVE (C50.911) Impression: 73 year old female with a new diagnosis of cancer of the right breast, upper outer quadrant. Clinical stage 1B, ER positive, PR positive, HER-2 negative. I discussed with the patient and her daughters present today initial surgical treatment options. We discussed options of breast conservation with lumpectomy or total mastectomy and sentinal  lymph node biopsy/dissection. After discussion she is undecided regarding lumpectomy versus mastectomy and would like a little time to consider this and call me back. We discussed the indications and nature of the procedures, and expected recovery, in detail. Surgical risks including anesthetic complications, cardiorespiratory complications, bleeding, infection, wound healing complications, blood clots, lymphedema, local and distant recurrence and possible need for further surgery based on the final pathology was discussed and understood. Chemotherapy, hormonal therapy and radiation therapy have been discussed. They have been provided with literature regarding the treatment of breast cancer. All questions were answered. she said she would call me back soon with her decision or any further questions. Current Plans Schedule for Surgery  After consideration the patient has elected to proceed with radioactive seed localized right breast lumpectomy and axillary sentinel lymph node biopsy

## 2018-09-02 NOTE — Anesthesia Postprocedure Evaluation (Signed)
Anesthesia Post Note  Patient: Mariah Burns  Procedure(s) Performed: RIGHT BREAST LUMPECTOMY WITH RADIOACTIVE SEED AND  RIGHT SENTINEL LYMPH NODE BIOPSY (Right Breast)     Patient location during evaluation: PACU Anesthesia Type: Regional and General Level of consciousness: awake and alert Pain management: pain level controlled Vital Signs Assessment: post-procedure vital signs reviewed and stable Respiratory status: spontaneous breathing, nonlabored ventilation, respiratory function stable and patient connected to nasal cannula oxygen Cardiovascular status: blood pressure returned to baseline and stable Postop Assessment: no apparent nausea or vomiting Anesthetic complications: no    Last Vitals:  Vitals:   09/02/18 1250 09/02/18 1300  BP:  136/64  Pulse: 68 64  Resp: 17 13  Temp:    SpO2: 95% 93%    Last Pain:  Vitals:   09/02/18 1245  TempSrc:   PainSc: 0-No pain                 Shontae Rosiles L Mauro Arps

## 2018-09-02 NOTE — Anesthesia Procedure Notes (Signed)
Procedure Name: LMA Insertion Date/Time: 09/02/2018 10:45 AM Performed by: Wanita Chamberlain, CRNA Pre-anesthesia Checklist: Patient identified, Emergency Drugs available, Suction available and Patient being monitored Patient Re-evaluated:Patient Re-evaluated prior to induction Oxygen Delivery Method: Circle system utilized Preoxygenation: Pre-oxygenation with 100% oxygen Induction Type: IV induction Ventilation: Mask ventilation without difficulty LMA: LMA inserted LMA Size: 4.0 Number of attempts: 1 Placement Confirmation: breath sounds checked- equal and bilateral,  CO2 detector and positive ETCO2 Tube secured with: Tape Dental Injury: Teeth and Oropharynx as per pre-operative assessment

## 2018-09-03 ENCOUNTER — Encounter (HOSPITAL_BASED_OUTPATIENT_CLINIC_OR_DEPARTMENT_OTHER): Payer: Self-pay | Admitting: General Surgery

## 2018-09-04 ENCOUNTER — Telehealth: Payer: Self-pay | Admitting: *Deleted

## 2018-09-04 NOTE — Telephone Encounter (Signed)
Received order for mammaprint testing. Requisition sent to [facility]

## 2018-09-16 ENCOUNTER — Telehealth: Payer: Self-pay | Admitting: *Deleted

## 2018-09-16 ENCOUNTER — Encounter: Payer: Self-pay | Admitting: *Deleted

## 2018-09-16 DIAGNOSIS — Z170 Estrogen receptor positive status [ER+]: Secondary | ICD-10-CM

## 2018-09-16 DIAGNOSIS — C50411 Malignant neoplasm of upper-outer quadrant of right female breast: Secondary | ICD-10-CM

## 2018-09-16 NOTE — Telephone Encounter (Signed)
Received Mammaprint result of LOW RISK. Physician team notified. Unable to leave msg to discuss results and inform that chemotherapy is not recommended based on these results. Will try again

## 2018-09-17 ENCOUNTER — Telehealth: Payer: Self-pay | Admitting: *Deleted

## 2018-09-17 NOTE — Telephone Encounter (Signed)
Spoke to pt regarding mammaprint results. Discussed based on these results she does not need chemotherapy. Informed her next step is xrt with Dr. Sondra Come. Confirmed appt date and time. Denies further questions or needs at this time.

## 2018-09-18 ENCOUNTER — Encounter (HOSPITAL_COMMUNITY): Payer: Self-pay | Admitting: Hematology

## 2018-09-24 ENCOUNTER — Ambulatory Visit
Admission: RE | Admit: 2018-09-24 | Discharge: 2018-09-24 | Disposition: A | Discharge status: Home / Self Care | Payer: Medicare Other | Source: Ambulatory Visit | Attending: Radiation Oncology | Admitting: Radiation Oncology

## 2018-09-24 ENCOUNTER — Encounter: Payer: Self-pay | Admitting: Radiation Oncology

## 2018-09-24 ENCOUNTER — Other Ambulatory Visit: Payer: Self-pay

## 2018-09-24 VITALS — BP 158/70 | HR 71 | Temp 98.4°F | Resp 20 | Ht 59.0 in | Wt 149.4 lb

## 2018-09-24 DIAGNOSIS — C50411 Malignant neoplasm of upper-outer quadrant of right female breast: Secondary | ICD-10-CM | POA: Diagnosis present

## 2018-09-24 DIAGNOSIS — Z7982 Long term (current) use of aspirin: Secondary | ICD-10-CM | POA: Insufficient documentation

## 2018-09-24 DIAGNOSIS — Z170 Estrogen receptor positive status [ER+]: Secondary | ICD-10-CM | POA: Diagnosis not present

## 2018-09-24 DIAGNOSIS — Z79899 Other long term (current) drug therapy: Secondary | ICD-10-CM | POA: Insufficient documentation

## 2018-09-24 NOTE — Progress Notes (Signed)
Radiation Oncology         [phone] ________________________________  Name: Mariah Burns MRN: [id number]  Date: 09/24/2018  DOB: [birth year]  Re-evaluation Note  CC: Glendale Chard, MD  Truitt Merle, MD    ICD-10-CM   1. Malignant neoplasm of upper-outer quadrant of right breast in female, estrogen receptor positive ([place]) C50.411    Z17.0    Diagnosis:   Stage IA (anatomic stage IIB) (pT2, pN1a, Mx) Right Breast UOQ Invasive Ductal Carcinoma, ER+ / PR+ / Her2-, Grade 1  Narrative:  The patient returns today to discuss radiation options following her definitive surgery. She was seen in the multidisciplinary breast clinic on 08/13/2018. Her daughter is present via telephone during the consult.  She opted to proceed with right breast lumpectomy with sentinel lymph node biopsy on 09/02/2018. Pathology from the procedure revealed: invasive ductal carcinoma with extracellular mucin, grade 1, 2.1 cm; ductal carcinoma in situ, intermediate grade; invasive carcinoma margins greater than 2 mm, DCIS margins less than 1 mm from anterior margin; metastatic breast carcinoma in one of three lymph nodes, 0.4 cm (1/3).  Mammaprint was obtained on the final surgical sample and the result of low risk predicts a risk of recurrence outside the breast over the next 5 years of 4%, if the patient's only systemic therapy is hormone therapy.  It also predicts no significant benefit from chemotherapy.  On review of systems, patient denies any lymphedema issues, breast pain, and any other symptoms.   ALLERGIES:  is allergic to fosamax [alendronate sodium]; omeprazole; motrin [ibuprofen]; and fluvastatin.  Meds: Current Outpatient Medications  Medication Sig Dispense Refill  . aspirin 81 MG chewable tablet Chew by mouth daily. Not taking daily    . [place] Oil-Levomenthol (FDGARD PO) Take 2 tablets by mouth as needed (upset stomach).    . Cholecalciferol (VITAMIN D-3 PO) Take 2,000 Units by mouth daily.     . clotrimazole (LOTRIMIN) 1 % cream Apply 1 application topically as needed (itching).    Marland Kitchen co-enzyme Q-10 30 MG capsule Take 30 mg by mouth daily.    . felodipine (PLENDIL) 10 MG 24 hr tablet TAKE 1 TABLET BY MOUTH EVERY DAY 90 tablet 2  . fish oil-omega-3 fatty acids 1000 MG capsule Take 2 capsules by mouth daily.      Marland Kitchen GLUCOSAMINE-CHONDROITIN PO Take 2 tablets by mouth daily.      Marland Kitchen L-Methylfolate-Algae-B12-B6 (METANX) 3-90.314-2-35 MG CAPS Take 1 capsule by mouth 2 (two) times daily.     . Menthol, Topical Analgesic, (BIOFREEZE ROLL-ON EX) Apply 1 application topically as needed (pain in back,neck,thumb).    . Misc Natural Products (TART CHERRY ADVANCED PO) Take 2 tablets by mouth daily.    . nitroGLYCERIN (NITROSTAT) 0.4 MG SL tablet Place 1 tablet (0.4 mg total) under the tongue every 5 (five) minutes as needed for chest pain. 25 tablet 2  . Polyvinyl Alcohol-Povidone PF (REFRESH) 1.4-0.6 % SOLN Place 1 drop into both eyes daily as needed (dry eyes).     . pravastatin (PRAVACHOL) 40 MG tablet Take 1 capsule by mouth on Monday, Wednesday, Friday 30 tablet 1  . traMADol (ULTRAM) 50 MG tablet Take 1 tablet (50 mg total) by mouth every 6 (six) hours as needed. 15 tablet 1  . traZODone (DESYREL) 50 MG tablet Take 50 mg by mouth at bedtime.     No current facility-administered medications for this encounter.     Physical Findings: The patient is in no acute distress. Patient is  alert and oriented.  height is _0  (1.499 m) and weight is 149 lb 6.4 oz (67.8 kg). Her oral temperature is 98.4 F (36.9 C). Her blood pressure is 158/70 (abnormal) and her pulse is 71. Her respiration is 20 and oxygen saturation is 100%. .  No significant changes. Lungs are clear to auscultation bilaterally. Heart has regular rate and rhythm. No palpable cervical, supraclavicular, or axillary adenopathy. Abdomen soft, non-tender, normal bowel sounds. Left Breast: no palpable mass, nipple discharge or bleeding.  Right Breast: shows a periareolar scar in the UOQ, which is healing well without signs of drainage or infection. She also has a separate scar on the axillary region, which is also healing well without signs of drainage or infection.   Lab Findings: Lab Results  Component Value Date   WBC 4.6 08/13/2018   HGB 12.0 08/13/2018   HCT 37.4 08/13/2018   MCV 88.0 08/13/2018   PLT 204 08/13/2018    Radiographic Findings: Nm Sentinel Node Inj-no Rpt (breast)  Result Date: 09/02/2018 Sulfur colloid was injected by the nuclear medicine technologist for melanoma sentinel node.    Impression:  Stage IA (anatomic stage IIB) (pT2, pN1a, Mx) Right Breast UOQ Invasive Ductal Carcinoma, ER+ / PR+ / Her2-, Grade 1-2  The patient was noted to have a low score on Mammaprint testing, and therefore, chemotherapy is not recommended. I would, however, recommend radiation therapy given her tumor size and node positivity.  Radiation fields would encompass the right breast and axillary region given the fact that she had metastatic spread to the axilla without a completion axillary dissection. the patient does not appear to have a very aggressive tumor, which is strongly ER and PR positive, and therefore, I feel we could delay initiation of her radiation therapy until June (given the covid-19 pandemic). Patient does have medium risk for complications with [id number] based on her age and medical history. During this period, the patient will need to start on adjuvant hormonal therapy, and I will speak with Dr. Burr Medico about initiating this treatment.  Today, I talked to the patient and daughter, who was present via telephone, about the findings and work-up thus far.  We discussed the natural history of breast cancer and general treatment, highlighting the role of radiotherapy in the management.  We discussed the available radiation techniques, and focused on the details of logistics and delivery.  We reviewed the anticipated  acute and late sequelae associated with radiation in this setting.  The patient was encouraged to ask questions that I answered to the best of my ability.  A patient consent form was discussed and signed.  We retained a copy for our records.  The patient would like to proceed with radiation and will be scheduled for CT simulation at a later date.  Plan: She is scheduled for CT simulation on 11/04/2018 at 3 pm. Patient will start hormonal therapy in the near future.  ____________________________________ -----------------------------------  Blair Promise, PhD, MD   This document serves as a record of services personally performed by Gery Pray, MD. It was created on his behalf by Wilburn Mylar, a trained medical scribe. The creation of this record is based on the scribe's personal observations and the provider's statements to them. This document has been checked and approved by the attending provider.

## 2018-09-24 NOTE — Patient Instructions (Signed)
Coronavirus (COVID-19) Are you at risk?  Are you at risk for the Coronavirus (COVID-19)?  To be considered HIGH RISK for Coronavirus (COVID-19), you have to meet the following criteria:  . Traveled to [place], [place], [place], [place] or [place]; or in the [place] to [place], [place], [place], or [place]; and have fever, cough, and shortness of breath within the last 2 weeks of travel OR . Been in close contact with a person diagnosed with COVID-19 within the last 2 weeks and have fever, cough, and shortness of breath . IF YOU DO NOT MEET THESE CRITERIA, YOU ARE CONSIDERED LOW RISK FOR COVID-19.  What to do if you are HIGH RISK for COVID-19?  . If you are having a medical emergency, call 911. . Seek medical care right away. Before you go to a doctor's office, urgent care or emergency department, call ahead and tell them about your recent travel, contact with someone diagnosed with COVID-19, and your symptoms. You should receive instructions from your physician's office regarding next steps of care.  . When you arrive at healthcare provider, tell the healthcare staff immediately you have returned from visiting [place], [place], [place], [place] or [place]; or traveled in the [place] to [place], [place], [place], or [place]; in the last two weeks or you have been in close contact with a person diagnosed with COVID-19 in the last 2 weeks.   . Tell the health care staff about your symptoms: fever, cough and shortness of breath. . After you have been seen by a medical provider, you will be either: o Tested for (COVID-19) and discharged home on quarantine except to seek medical care if symptoms worsen, and asked to  - Stay home and avoid contact with others until you get your results (4-5 days)  - Avoid travel on public transportation if possible (such as bus, train, or airplane) or o Sent to the Emergency Department by EMS for evaluation, COVID-19 testing, and possible  admission depending on your condition and test results.  What to do if you are LOW RISK for COVID-19?  Reduce your risk of any infection by using the same precautions used for avoiding the common cold or flu:  . Wash your hands often with soap and warm water for at least 20 seconds.  If soap and water are not readily available, use an alcohol-based hand sanitizer with at least 60% alcohol.  . If coughing or sneezing, cover your mouth and nose by coughing or sneezing into the elbow areas of your shirt or coat, into a tissue or into your sleeve (not your hands). . Avoid shaking hands with others and consider head nods or verbal greetings only. . Avoid touching your eyes, nose, or mouth with unwashed hands.  . Avoid close contact with people who are sick. . Avoid places or events with large numbers of people in one location, like concerts or sporting events. . Carefully consider travel plans you have or are making. . If you are planning any travel outside or inside the [place], visit the CDC's Travelers' Health webpage for the latest health notices. . If you have some symptoms but not all symptoms, continue to monitor at home and seek medical attention if your symptoms worsen. . If you are having a medical emergency, call 911.   ADDITIONAL HEALTHCARE OPTIONS FOR PATIENTS  [facility] / e-Visit: [email or web address]         [facility]: [phone]  [facility]: [phone]                   [facility]: [phone]   

## 2018-09-24 NOTE — Progress Notes (Signed)
Location of Breast Cancer: Malignant neoplasm of upper-outer quadrant of right breast in female, estrogen receptor positive ([facility]).  Histology per Pathology Report: 09/02/18:  Diagnosis 1. Breast, lumpectomy, Right w/seed - INVASIVE DUCTAL CARCINOMA WITH EXTRACELLULAR MUCIN, GRADE I, 2.1 CM. - DUCTAL CARCINOMA IN SITU, INTERMEDIATE NUCLEAR GRADE. - INVASIVE CARCINOMA IS 2 MM FROM SUPERIOR MARGIN AND 2.5 MM FROM LATERAL MARGIN. - DUCTAL CARCINOMA IN SITU IS LESS THAN 1 MM FROM ANTERIOR MARGIN, 4 MM FROM SUPERIOR AND LATERAL MARGINS, 5 MM FROM INFERIOR MARGIN AND 8 MM FROM THE POSTERIOR MARGIN. - SMALL INTRADUCTAL PAPILLOMA. - NO EVIDENCE OF LYMPHOVASCULAR OR PERINEURAL INVASION. - BIOPSY SITE CHANGES. - SEE ONCOLOGY TABLE. 2. Lymph node, sentinel, biopsy, Right Axillary - LYMPH NODE, NEGATIVE FOR CARCINOMA (0/1). 3. Lymph node, sentinel, biopsy, Right - LYMPH NODE, NEGATIVE FOR CARCINOMA (0/1). 4. Lymph node, sentinel, biopsy, Right - METASTATIC BREAST CARCINOMA TO ONE LYMPH NODE (1/1). - METASTATIC FOCUS MEASURES 0.4 CM IN GREATEST DIMENSION. - NO DEFINITE EVIDENCE OF EXTRANODAL EXTENSION.  Receptor Status: ER(100%), PR (100%), Her2-neu (negative), Ki-(10%)  Did patient present with symptoms (if so, please note symptoms) or was this found on screening mammography?: She found her mass by screening mammogram. She did not feel any lump in her breast  Past/Anticipated interventions by surgeon, if any: 09/02/18:  Procedure: Procedure(s): RIGHT BREAST LUMPECTOMY WITH RADIOACTIVE SEED AND  RIGHT SENTINEL LYMPH NODE BIOPSY   Surgeon: Hoxworth, Benjamin T   Past/Anticipated interventions by medical oncology, if any: Chemotherapy Per Dr. Feng 08/13/18: PLAN:  -Proceed with surgery soon  -oncotype on surgical sample  -F/u likely after radiation or sooner if needed  Lymphedema issues, if any:  Pt hasn't noticed any s/s.  Pain issues, if any:  Pt denies c/o pain in breast "yet".  SAFETY  ISSUES:  Prior radiation? No  Pacemaker/ICD? No  Possible current pregnancy? No  Is the patient on methotrexate? No  Current Complaints / other details:  Pt presents today for consult with Dr. Kinard. Pt will contact daughter and have daughter available via cell phone during consult. Pt is distractible and talkative.   BP (!) 158/70 (BP Location: Left Arm, Patient Position: Sitting)   Pulse 71   Temp 98.4 F (36.9 C) (Oral)   Resp 20   Ht 4' 11" (1.499 m)   Wt 149 lb 6.4 oz (67.8 kg)   SpO2 100%   BMI 30.18 kg/m   Wt Readings from Last 3 Encounters:  09/24/18 149 lb 6.4 oz (67.8 kg)  09/02/18 148 lb 13 oz (67.5 kg)  08/19/18 152 lb (68.9 kg)    W , RN BSN       W , RN 09/24/2018,1:44 PM    

## 2018-09-25 ENCOUNTER — Encounter: Payer: Self-pay | Admitting: *Deleted

## 2018-09-29 ENCOUNTER — Telehealth: Payer: Self-pay | Admitting: Hematology

## 2018-09-29 ENCOUNTER — Inpatient Hospital Stay: Payer: Medicare Other | Admitting: Hematology

## 2018-09-29 ENCOUNTER — Other Ambulatory Visit: Payer: Self-pay

## 2018-09-29 NOTE — Telephone Encounter (Signed)
Scheduled appt per 4/27 pt is aware of apt date and time on 4/29

## 2018-09-29 NOTE — Progress Notes (Incomplete)
[place]   [phone] [phone]   Clinic Follow up Note   Patient Care Team: Glendale Chard, MD as PCP - General (Internal Medicine) Belva Crome, MD as PCP - Cardiology (Cardiology) Mauro Kaufmann, RN as Oncology Nurse Navigator Rockwell Germany, RN as Oncology Nurse Navigator Excell Seltzer, MD as Consulting Physician (General Surgery) Truitt Merle, MD as Consulting Physician (Hematology) Gery Pray, MD as Consulting Physician (Radiation Oncology)   I connected with Mariah Burns on 09/29/2018 at  2:30 PM EDT by {Blank single:19197::"video enabled telemedicine visit","telephone visit"} and verified that I am speaking with the correct person using two identifiers.  I discussed the limitations, risks, security and privacy concerns of performing an evaluation and management service by telephone and the availability of in person appointments. I also discussed with the patient that there may be a patient responsible charge related to this service. The patient expressed understanding and agreed to proceed.   Other persons participating in the visit and their role in the encounter:  ***  Patient's location:  *** Provider's location:  ***  CHIEF COMPLAINT: F/u of recently diagnosed right breast cancer   SUMMARY OF ONCOLOGIC HISTORY: Oncology History   Cancer Staging Malignant neoplasm of upper-outer quadrant of right breast in female, estrogen receptor positive ([place]) Staging form: Breast, AJCC 8th Edition - Clinical stage from 08/04/2018: Stage IA (cT1c, cN0, cM0, G2, ER+, PR+, HER2-) - Signed by Truitt Merle, MD on 08/12/2018       Malignant neoplasm of upper-outer quadrant of right breast in female, estrogen receptor positive ([place])   07/29/2018 Mammogram    Diagnostic Mammgram 07/29/18  IMPRESSION:  The 1.7cm oval mass in the right breast upper outer qudrant middle third, 3-5 from nipple is suspicious of malignancy.     08/04/2018 Cancer  Staging    Staging form: Breast, AJCC 8th Edition - Clinical stage from 08/04/2018: Stage IA (cT1c, cN0, cM0, G2, ER+, PR+, HER2-) - Signed by Truitt Merle, MD on 08/12/2018    08/04/2018 Initial Biopsy    Diagnosis 08/04/18  Breast, right, needle core biopsy, 1.7 cm irregular solid mass at 9 o'clock, 4 cm fn - INVASIVE DUCTAL CARCINOMA WITH EXTRACELLULAR MUCIN.    08/04/2018 Receptors her2    Results: IMMUNOHISTOCHEMICAL AND MORPHOMETRIC ANALYSIS PERFORMED MANUALLY The tumor cells are NEGATIVE for Her2 (0). Estrogen Receptor: 100%, POSITIVE, STRONG STAINING INTENSITY Progesterone Receptor: 100%, POSITIVE, STRONG STAINING INTENSITY Proliferation Marker Ki67: 10%    08/11/2018 Initial Diagnosis    Malignant neoplasm of upper-outer quadrant of right breast in female, estrogen receptor positive ([place])      CURRENT THERAPY:  ***  INTERVAL HISTORY: *** Mariah Burns is here for a follow up of right breast cancer. ***She was able to identify herself by birth date.      REVIEW OF SYSTEMS:  *** Constitutional: Denies fevers, chills or abnormal weight loss Eyes: Denies blurriness of vision Ears, nose, mouth, throat, and face: Denies mucositis or sore throat Respiratory: Denies cough, dyspnea or wheezes Cardiovascular: Denies palpitation, chest discomfort or lower extremity swelling Gastrointestinal:  Denies nausea, heartburn or change in bowel habits Skin: Denies abnormal skin rashes Lymphatics: Denies new lymphadenopathy or easy bruising Neurological:Denies numbness, tingling or new weaknesses Behavioral/Psych: Mood is stable, no new changes  All other systems were reviewed with the patient and are negative.  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 d/t adverse side effects TLC 106%, DLCO 75% 2009    SURGICAL HISTORY: Past Surgical History:  Procedure Laterality Date   BREAST LUMPECTOMY WITH RADIOACTIVE SEED AND SENTINEL LYMPH NODE BIOPSY Right 09/02/2018   Procedure: RIGHT BREAST LUMPECTOMY WITH RADIOACTIVE SEED AND  RIGHT SENTINEL LYMPH NODE BIOPSY;  Surgeon: Excell Seltzer, MD;  Location: [place];  Service: General;  Laterality: Right;   BRONCHOSCOPY     dx'd sarcoid   KNEE ARTHROPLASTY Left 03/13/2017   NEUROPLASTY / TRANSPOSITION ULNAR NERVE AT ELBOW  09/2011   TOTAL ABDOMINAL HYSTERECTOMY      I have reviewed the social history and family history with the patient and they are unchanged from previous note.  ALLERGIES:  is allergic to fosamax [alendronate sodium]; omeprazole; motrin [ibuprofen]; and fluvastatin.  MEDICATIONS:  Current Outpatient Medications  Medication Sig Dispense Refill   aspirin 81 MG chewable tablet Chew by mouth daily. Not taking daily     [place] Oil-Levomenthol (FDGARD PO) Take 2 tablets by mouth as needed (upset stomach).     Cholecalciferol (VITAMIN D-3 PO) Take 2,000 Units by mouth daily.     clotrimazole (LOTRIMIN) 1 % cream Apply 1 application topically as needed (itching).     co-enzyme Q-10 30 MG capsule Take 30 mg by mouth daily.     felodipine (PLENDIL) 10 MG 24 hr tablet TAKE 1 TABLET BY MOUTH EVERY DAY 90 tablet 2   fish oil-omega-3 fatty acids 1000 MG capsule Take 2 capsules by mouth daily.       GLUCOSAMINE-CHONDROITIN PO Take 2 tablets by mouth daily.       L-Methylfolate-Algae-B12-B6 (METANX) 3-90.314-2-35 MG CAPS Take 1 capsule by mouth 2 (two) times daily.      Menthol, Topical Analgesic, (BIOFREEZE ROLL-ON EX) Apply 1 application topically as needed (pain in back,neck,thumb).     Misc Natural Products (TART CHERRY ADVANCED PO) Take 2 tablets by mouth daily.      nitroGLYCERIN (NITROSTAT) 0.4 MG SL tablet Place 1 tablet (0.4 mg total) under the tongue every 5 (five) minutes as needed for chest pain. 25 tablet 2   Polyvinyl Alcohol-Povidone PF (REFRESH) 1.4-0.6 % SOLN Place 1 drop into both eyes daily as needed (dry eyes).      pravastatin (PRAVACHOL) 40 MG tablet Take 1 capsule by mouth on Monday, Wednesday, Friday 30 tablet 1   traMADol (ULTRAM) 50 MG tablet Take 1 tablet (50 mg total) by mouth every 6 (six) hours as needed. (Patient taking differently: Take 25 mg by mouth every 6 (six) hours as needed. ) 15 tablet 1   traZODone (DESYREL) 50 MG tablet Take 50 mg by mouth at bedtime.     No current facility-administered medications for this visit.     PHYSICAL EXAMINATION: ECOG PERFORMANCE STATUS: {CHL ONC ECOG PS:[id number]}  No vitals taken today, Exam not performed today {There were no vitals filed for this visit. There were no vitals filed for this visit.}  {GENERAL:alert, no distress and comfortable SKIN: skin color, texture, turgor are normal, no rashes or significant lesions EYES: normal, Conjunctiva are pink and non-injected, sclera clear OROPHARYNX:no exudate, no erythema and lips, buccal mucosa, and tongue normal  NECK:  supple, thyroid normal size, non-tender, without nodularity LYMPH:  no palpable lymphadenopathy in the cervical, axillary or inguinal LUNGS: clear to auscultation and percussion with normal breathing effort HEART: regular rate & rhythm and no murmurs and no lower extremity edema ABDOMEN:abdomen soft, non-tender and normal bowel sounds Musculoskeletal:no cyanosis of digits and no clubbing  NEURO: alert & oriented x 3 with fluent speech, no focal motor/sensory deficits}  LABORATORY DATA:  I have reviewed the data as listed CBC Latest Ref Rng & Units 08/13/2018 11/27/2014 12/04/2006  WBC 4.0 - 10.5 K/uL 4.6 4.5 3.8(L)  Hemoglobin 12.0 - 15.0 g/dL 12.0 12.2 11.8(L)  Hematocrit 36.0 - 46.0 % 37.4 36.4 34.4(L)  Platelets  150 - 400 K/uL 204 206 227     CMP Latest Ref Rng & Units 08/13/2018 06/30/2018 05/15/2018  Glucose 70 - 99 mg/dL 110(H) - 95  BUN 8 - 23 mg/dL 12 - 14  Creatinine 0.44 - 1.00 mg/dL 0.91 - 0.97  Sodium 135 - 145 mmol/L 143 - 149(H)  Potassium 3.5 - 5.1 mmol/L 3.5 - 3.6  Chloride 98 - 111 mmol/L 107 - 108(H)  CO2 22 - 32 mmol/L 26 - 23  Calcium 8.9 - 10.3 mg/dL 9.5 - 9.5  Total Protein 6.5 - 8.1 g/dL 8.2(H) 7.5 7.8  Total Bilirubin 0.3 - 1.2 mg/dL 0.5 0.4 0.5  Alkaline Phos 38 - 126 U/L 79 68 73  AST 15 - 41 U/L 18 27 21   ALT 0 - 44 U/L 14 22 16           RADIOGRAPHIC STUDIES: I have personally reviewed the radiological images as listed and agreed with the findings in the report. No results found.   ASSESSMENT & PLAN:  Mariah Burns is a 73 y.o. female with   1. Malignant neoplasm of upper-outer quadrant of right breast, Stage IA, c(T1c,N0,M0), ER/PR+, HER2-, Grade II -She was recently diagnosed in 08/2018. She is s/p right lumpectomy with SLNB on 09/02/18  -We reviewed her pathology resport in great detail. Report shows grade I tumor was surgically removed with 1/3 positive lymph nodes.  -Her mammaprint showed 10% low risk of distant recurrance and Mammaprint index of 0.287. There is no stong benfit of adjuvant chemotherapy, I do not recommend it.  -Given positve lymph node, she planned to proceed with Adjuvant radiation to reudce her risk of local recurrance. However radiation has been postponed due to COVID-19.  -With ER/PR postive disease, she will benefit with anti-estrogen therapy to reduce her distant risk of recurrnce. I recommend she start with *** while waiting to start radiation.  ***    -We discussed her image findings and the biopsy results in great details. She has invasive ductal carcinoma of right breast.  -Giving the relative small size of tumor, she is a candidate for lumpectomy with possible sentinel lymph node biopsy. She is agreeable with that. She was  seen by Dr. Excell Seltzer today and likely will proceed with surgery soon.  -I discussed the option of Oncotype Dx test on the surgical sample to determine her risk of recurrence and benefit of adjuvant chemotherapy. I suspect low risk disease given the strong ER and PR positive disease and early stage. She is interested. She is 58, but overall health is good, would be a candidate for chemo if needed -She was also seen by radiation oncologist Dr. Sondra Come today who discussed her option for adjuvant radiation to reduce the risk for local recurrence.  -Giving the strong ER and PR expression  in her postmenopausal status, I recommend adjuvant endocrine therapy with aromatase inhibitor for a total of 5-10 years to reduce the risk of cancer recurrence. Potential benefits and side effects were discussed with patient and she is interested.  -We also discussed the breast cancer surveillance after her surgery. She will continue annual screening mammogram, self exam, and a routine office visit with lab and exam with [place]. -Labs reviewed, CBC and CMP WNL, BG at 110, Protein at 8.2.  -F/u likely after radiation   2. Genetic Testing  -She is not very aware of her family history of cancer.  -Given she has 3 daughters, she will consider genetic testing even if out of pocket.   3. HTN and DM -overall controlled, continue to f/u with PCP   PLAN:  ***  -Proceed with surgery soon  -oncotype on surgical sample  -F/u likely after radiation or sooner if needed    No problem-specific Assessment & Plan notes found for this encounter.   No orders of the defined types were placed in this encounter.  I discussed the assessment and treatment plan with the patient. The patient was provided an opportunity to ask questions and all were answered. The patient agreed with the plan and demonstrated an understanding of the instructions.  The patient was advised to call back or seek an in-person evaluation if the symptoms worsen  or if the condition fails to improve as anticipated.  I provided *** minutes of {Blank single:19197::"face-to-face video visit time","non face-to-face telephone visit time"} during this encounter, and > 50% was spent counseling as documented under my assessment & plan.    Joslyn Devon 09/29/2018   Oneal Deputy, am acting as scribe for Truitt Merle, MD.   {Add scribe attestation statement}

## 2018-10-01 ENCOUNTER — Other Ambulatory Visit: Payer: Self-pay | Admitting: Internal Medicine

## 2018-10-01 ENCOUNTER — Inpatient Hospital Stay: Payer: Medicare Other | Attending: Hematology | Admitting: Hematology

## 2018-10-01 ENCOUNTER — Encounter: Payer: Self-pay | Admitting: Hematology

## 2018-10-01 DIAGNOSIS — C50411 Malignant neoplasm of upper-outer quadrant of right female breast: Secondary | ICD-10-CM

## 2018-10-01 DIAGNOSIS — Z170 Estrogen receptor positive status [ER+]: Secondary | ICD-10-CM

## 2018-10-01 DIAGNOSIS — C773 Secondary and unspecified malignant neoplasm of axilla and upper limb lymph nodes: Secondary | ICD-10-CM

## 2018-10-01 DIAGNOSIS — Z79811 Long term (current) use of aromatase inhibitors: Secondary | ICD-10-CM | POA: Diagnosis not present

## 2018-10-01 MED ORDER — ANASTROZOLE 1 MG PO TABS: 1.0000 mg | ORAL_TABLET | Freq: Every day | ORAL | 3 refills | Status: DC

## 2018-10-01 NOTE — Progress Notes (Signed)
[place]   [phone] [phone]   Clinic Follow up Note   Patient Care Team: Glendale Chard, MD as PCP - General (Internal Medicine) Belva Crome, MD as PCP - Cardiology (Cardiology) Mauro Kaufmann, RN as Oncology Nurse Navigator Rockwell Germany, RN as Oncology Nurse Navigator Excell Seltzer, MD as Consulting Physician (General Surgery) Truitt Merle, MD as Consulting Physician (Hematology) Gery Pray, MD as Consulting Physician (Radiation Oncology)   I connected with Mariah Burns on 10/01/2018 at  1:30 PM EDT by telephone visit and verified that I am speaking with the correct person using two identifiers.  I discussed the limitations, risks, security and privacy concerns of performing an evaluation and management service by telephone and the availability of in person appointments. I also discussed with the patient that there may be a patient responsible charge related to this service. The patient expressed understanding and agreed to proceed.   Patient's location:  Her home  Provider's location:  My office  CHIEF COMPLAINT: F/u of recently diagnosed right breast cancer  SUMMARY OF ONCOLOGIC HISTORY: Oncology History   Cancer Staging Malignant neoplasm of upper-outer quadrant of right breast in female, estrogen receptor positive ([place]) Staging form: Breast, AJCC 8th Edition - Clinical stage from 08/04/2018: Stage IA (cT1c, cN0, cM0, G2, ER+, PR+, HER2-) - Signed by Truitt Merle, MD on 08/12/2018 - Pathologic stage from 09/02/2018: Stage IA (pT2, pN1a, cM0, G1, ER+, PR+, HER2-) - Signed by Truitt Merle, MD on 09/29/2018       Malignant neoplasm of upper-outer quadrant of right breast in female, estrogen receptor positive ([place])   07/29/2018 Mammogram    Diagnostic Mammgram 07/29/18  IMPRESSION:  The 1.7cm oval mass in the right breast upper outer qudrant middle third, 3-5 from nipple is suspicious of malignancy.     08/04/2018 Cancer Staging     Staging form: Breast, AJCC 8th Edition - Clinical stage from 08/04/2018: Stage IA (cT1c, cN0, cM0, G2, ER+, PR+, HER2-) - Signed by Truitt Merle, MD on 08/12/2018    08/04/2018 Initial Biopsy    Diagnosis 08/04/18  Breast, right, needle core biopsy, 1.7 cm irregular solid mass at 9 o'clock, 4 cm fn - INVASIVE DUCTAL CARCINOMA WITH EXTRACELLULAR MUCIN.    08/04/2018 Receptors her2    Results: IMMUNOHISTOCHEMICAL AND MORPHOMETRIC ANALYSIS PERFORMED MANUALLY The tumor cells are NEGATIVE for Her2 (0). Estrogen Receptor: 100%, POSITIVE, STRONG STAINING INTENSITY Progesterone Receptor: 100%, POSITIVE, STRONG STAINING INTENSITY Proliferation Marker Ki67: 10%    08/11/2018 Initial Diagnosis    Malignant neoplasm of upper-outer quadrant of right breast in female, estrogen receptor positive ([place])    09/02/2018 Surgery    RIGHT BREAST LUMPECTOMY WITH RADIOACTIVE SEED AND  RIGHT SENTINEL LYMPH NODE BIOPSY by Dr. Excell Seltzer  09/02/18     09/02/2018 Pathology Results    Diagnosis 1. Breast, lumpectomy, Right w/seed - INVASIVE DUCTAL CARCINOMA WITH EXTRACELLULAR MUCIN, GRADE I, 2.1 CM. - DUCTAL CARCINOMA IN SITU, INTERMEDIATE NUCLEAR GRADE. - INVASIVE CARCINOMA IS 2 MM FROM SUPERIOR MARGIN AND 2.5 MM FROM LATERAL MARGIN. - DUCTAL CARCINOMA IN SITU IS LESS THAN 1 MM FROM ANTERIOR MARGIN, 4 MM FROM SUPERIOR AND LATERAL MARGINS, 5 MM FROM INFERIOR MARGIN AND 8 MM FROM THE POSTERIOR MARGIN. - SMALL INTRADUCTAL PAPILLOMA. - NO EVIDENCE OF LYMPHOVASCULAR OR PERINEURAL INVASION. - BIOPSY SITE CHANGES. - SEE ONCOLOGY TABLE. 2. Lymph node, sentinel, biopsy, Right Axillary - LYMPH NODE, NEGATIVE FOR CARCINOMA (0/1). 3. Lymph node, sentinel, biopsy, Right -  LYMPH NODE, NEGATIVE FOR CARCINOMA (0/1). 4. Lymph node, sentinel, biopsy, Right - METASTATIC BREAST CARCINOMA TO ONE LYMPH NODE (1/1). - METASTATIC FOCUS MEASURES 0.4 CM IN GREATEST DIMENSION. - NO DEFINITE EVIDENCE OF EXTRANODAL EXTENSION.    09/02/2018  Miscellaneous    MammaPrint  09/02/18 Low risk with 10% average 10-year risk of recurrnce untreated  Mammaprint Index: +0.287 97.8% of patients treated with hormonal therapy have no distnat recurrance.     09/02/2018 Cancer Staging    Staging form: Breast, AJCC 8th Edition - Pathologic stage from 09/02/2018: Stage IA (pT2, pN1a, cM0, G1, ER+, PR+, HER2-) - Signed by Truitt Merle, MD on 09/29/2018     Anti-estrogen oral therapy    Anastrozole 40m daily starting 10/05/18      CURRENT THERAPY:  Anastrozole 184mdaily starting 10/05/18  INTERVAL HISTORY:  Mariah DROZDOWSKIs here for a follow up of right breast cancer. She was able to identify herself by birth date. She notes she is doing well. She notes she had hysterectomy in 1975 and her menopause was moderate.  She notes she is on Pravachol as she has pre-diabetic. She is concerned AI can increase her BG. She notes sharp pain post surgery. She will take Tramadol at night as needed. She will also use ice to help it.  She notes she has OA. She notes she has a mole that itches, she is willing to see a dermatologist for this.  She notes prior DEXA was weak. She has been taking Vitamin D.   REVIEW OF SYSTEMS:   Constitutional: Denies fevers, chills or abnormal weight loss Eyes: Denies blurriness of vision Ears, nose, mouth, throat, and face: Denies mucositis or sore throat Respiratory: Denies cough, dyspnea or wheezes Cardiovascular: Denies palpitation, chest discomfort or lower extremity swelling Gastrointestinal:  Denies nausea, heartburn or change in bowel habits Skin: Denies abnormal skin rashes MSK: (+) Osteoarthritis, manageable  Breast; (+) right breast shooting pain Lymphatics: Denies new lymphadenopathy or easy bruising Neurological:Denies numbness, tingling or new weaknesses Behavioral/Psych: Mood is stable, no new changes  All other systems were reviewed with the patient and are negative.  MEDICAL HISTORY:  Past Medical History:   Diagnosis Date  . Anxiety   . Cancer ([facility]   right breast IDC  . Hyperlipidemia   . Hypertension   . OSA (obstructive sleep apnea)    NPSG 03/08/2010  AHI 19.8/hr, CPAP nightly  . Osteopenia   . Pre-diabetes   . 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 BREAST LUMPECTOMY WITH RADIOACTIVE SEED AND SENTINEL LYMPH NODE BIOPSY Right 09/02/2018   Procedure: RIGHT BREAST LUMPECTOMY WITH RADIOACTIVE SEED AND  RIGHT SENTINEL LYMPH NODE BIOPSY;  Surgeon: HoExcell SeltzerMD;  [facility] Service: General;  Laterality: Right;  . BRONCHOSCOPY     dx'd sarcoid  . KNEE ARTHROPLASTY Left 03/13/2017  . NEUROPLASTY / TRANSPOSITION ULNAR NERVE AT ELBOW  09/2011  . TOTAL ABDOMINAL HYSTERECTOMY      I have reviewed the social history and family history with the patient and they are unchanged from previous note.  ALLERGIES:  is allergic to fosamax [alendronate sodium]; omeprazole; motrin [ibuprofen]; and fluvastatin.  MEDICATIONS:  Current Outpatient Medications  Medication Sig Dispense Refill  . anastrozole (ARIMIDEX) 1 MG tablet Take 1 tablet (1 mg  total) by mouth daily. 30 tablet 3  . aspirin 81 MG chewable tablet Chew by mouth daily. Not taking daily    . [place] Oil-Levomenthol (FDGARD PO) Take 2 tablets by mouth as needed (upset stomach).    . Cholecalciferol (VITAMIN D-3 PO) Take 2,000 Units by mouth daily.    . clotrimazole (LOTRIMIN) 1 % cream Apply 1 application topically as needed (itching).    Marland Kitchen co-enzyme Q-10 30 MG capsule Take 30 mg by mouth daily.    . felodipine (PLENDIL) 10 MG 24 hr tablet TAKE 1 TABLET BY MOUTH EVERY DAY 90 tablet 2  . fish oil-omega-3 fatty acids 1000 MG capsule Take 2 capsules by mouth daily.      Marland Kitchen GLUCOSAMINE-CHONDROITIN PO Take 2 tablets by mouth daily.       Marland Kitchen L-Methylfolate-Algae-B12-B6 (METANX) 3-90.314-2-35 MG CAPS Take 1 capsule by mouth 2 (two) times daily.     . Menthol, Topical Analgesic, (BIOFREEZE ROLL-ON EX) Apply 1 application topically as needed (pain in back,neck,thumb).    . Misc Natural Products (TART CHERRY ADVANCED PO) Take 2 tablets by mouth daily.    . nitroGLYCERIN (NITROSTAT) 0.4 MG SL tablet Place 1 tablet (0.4 mg total) under the tongue every 5 (five) minutes as needed for chest pain. 25 tablet 2  . Polyvinyl Alcohol-Povidone PF (REFRESH) 1.4-0.6 % SOLN Place 1 drop into both eyes daily as needed (dry eyes).     . pravastatin (PRAVACHOL) 40 MG tablet TAKE 1 CAPSULE BY MOUTH ON MONDAY, WEDNESDAY, FRIDAY 30 tablet 1  . traMADol (ULTRAM) 50 MG tablet Take 1 tablet (50 mg total) by mouth every 6 (six) hours as needed. (Patient taking differently: Take 25 mg by mouth every 6 (six) hours as needed. ) 15 tablet 1  . traZODone (DESYREL) 50 MG tablet Take 50 mg by mouth at bedtime.     No current facility-administered medications for this visit.     PHYSICAL EXAMINATION: ECOG PERFORMANCE STATUS: 0 - Asymptomatic  No vitals taken today, Exam not performed today  LABORATORY DATA:  I have reviewed the data as listed CBC Latest Ref Rng & Units 08/13/2018 11/27/2014 12/04/2006  WBC 4.0 - 10.5 K/uL 4.6 4.5 3.8(L)  Hemoglobin 12.0 - 15.0 g/dL 12.0 12.2 11.8(L)  Hematocrit 36.0 - 46.0 % 37.4 36.4 34.4(L)  Platelets 150 - 400 K/uL 204 206 227     CMP Latest Ref Rng & Units 08/13/2018 06/30/2018 05/15/2018  Glucose 70 - 99 mg/dL 110(H) - 95  BUN 8 - 23 mg/dL 12 - 14  Creatinine 0.44 - 1.00 mg/dL 0.91 - 0.97  Sodium 135 - 145 mmol/L 143 - 149(H)  Potassium 3.5 - 5.1 mmol/L 3.5 - 3.6  Chloride 98 - 111 mmol/L 107 - 108(H)  CO2 22 - 32 mmol/L 26 - 23  Calcium 8.9 - 10.3 mg/dL 9.5 - 9.5  Total Protein 6.5 - 8.1 g/dL 8.2(H) 7.5 7.8  Total Bilirubin 0.3 - 1.2 mg/dL 0.5 0.4 0.5  Alkaline Phos 38 - 126 U/L 79 68 73  AST 15 - 41 U/L 18  27 21   ALT 0 - 44 U/L 14 22 16       RADIOGRAPHIC STUDIES: I have personally reviewed the radiological images as listed and agreed with the findings in the report. No results found.   ASSESSMENT & PLAN:  Mariah Burns is a 73 y.o. female with   1. Malignant neoplasm of upper-outer quadrant of right breast, Stage IA, pT2,N0,M0, ER/PR+, HER2-, Grade I, Mammaprint  low risk  -She was recently diagnosed in 08/2018. She is s/p right lumpectomy with SLNB on 09/02/18  -We reviewed her pathology report in great detail. Report shows grade I tumor was surgically removed with 1/3 positive lymph nodes, negative margins.  -Her mammaprint showed low risk of distant recurrence with Mammaprint index of 0.287. There is no benefit of adjuvant chemotherapy, I do not recommend it.  -Given positive lymph node in creases her risk of recurrence. She planned to proceed with Adjuvant radiation to reduce her risk of local recurrence. However radiation has been postponed due to COVID-19 pandemic.  -With ER/PR strongly positive disease, she will benefit with anti-estrogen therapy to reduce her distant risk of recurrence. I recommend she start with Anastrozole while waiting to start radiation.   --The potential benefit and side effects, which includes but not limited to, hot flash, skin and vaginal dryness, metabolic changes ( increased blood glucose, cholesterol, weight, etc.), slightly in increased risk of cardiovascular disease, cataracts, muscular and joint discomfort, osteopenia and osteoporosis, etc, were discussed with her in great details. She is agreeable. Plan to start next week or this week. I call in today -Plan to continue anastrozole for 5-7 years if she tolerates well  -She is clinically doing well and stable. There is no clinical concern for reoccurrence.  -F/u in 5-6 weeks   2. Bone health  -I discussed anastrozole can weaken her bone.  -I will obtain a DEXA scan in 1-2 months.   3. Genetic Testing   -She is not very aware of her family history of cancer.  -Given she has 3 daughters, she will consider genetic testing even if out of pocket.   4. HTN and DM -overall controlled, continue to f/u with PCP   PLAN:  -I called in Anastrozole to start with in 1-2 weeks  -DEXA scan in 1-2 months  -F/u in 5-6 weeks    No problem-specific Assessment & Plan notes found for this encounter.   Orders Placed This Encounter  Procedures  . DG Bone Density    Standing Status:   Future    Standing Expiration Date:   10/01/2019    Order Specific Question:   Reason for Exam (SYMPTOM  OR DIAGNOSIS REQUIRED)    Answer:   screening    Order Specific Question:   Preferred imaging location?    Answer:   [facility]   I discussed the assessment and treatment plan with the patient. The patient was provided an opportunity to ask questions and all were answered. The patient agreed with the plan and demonstrated an understanding of the instructions.  The patient was advised to call back or seek an in-person evaluation if the symptoms worsen or if the condition fails to improve as anticipated.  I provided 25 minutes of non face-to-face telephone visit time during this encounter, and > 50% was spent counseling as documented under my assessment & plan.    Truitt Merle, MD 10/01/2018   I, Joslyn Devon, am acting as scribe for Truitt Merle, MD.   I have reviewed the above documentation for accuracy and completeness, and I agree with the above.

## 2018-10-02 ENCOUNTER — Telehealth: Payer: Self-pay | Admitting: Hematology

## 2018-10-02 NOTE — Telephone Encounter (Signed)
Scheduled appt per 4/29 los. ° °A calendar will be mailed out. °

## 2018-10-03 ENCOUNTER — Encounter: Payer: Self-pay | Admitting: Internal Medicine

## 2018-10-08 ENCOUNTER — Ambulatory Visit: Payer: Medicare Other

## 2018-10-08 NOTE — Progress Notes (Signed)
Nutrition:  RD working remotely.  RN, Malachy Mood requested RD call patient regarding vegan diet.    73 year old female with breast cancer.  S/p right lumpectomy on 08/2018. History of HLD, HTN, preDM, sarcoidosis.  Patient has started anastrozole and planning radiation treatments in the future.   Spoke with patient via phone.  Patient reports that she had read a book recently regarding eating a vegan diet to help cure cancer.    Anthropometrics:   Height: 4'11"  Weight: 149 lb BMI: 30  Stable weight   NUTRITION DIAGNOSIS: Food and nutrition related knowledge deficit related to new diagnosis of breast cancer as evidenced by patient with questions   INTERVENTION:  Discussed current evidenced on eating plant based diet with patient.  Patient still has handouts from [facility] meeting on 08/2018 that RD provided.   Discussed difference between plant-based diet and various vegetarian diets.   Questions answered.   Contact information given to patient and encouraged to reach out with further questions or concerns.   MONITORING, EVALUATION, GOAL: Patient will consume plant-based diet to maintain lean body mass during treatment.    NEXT VISIT: no follow-up planned  Mariah Burns, [place], [place] Registered Dietitian [phone] (pager)

## 2018-10-14 ENCOUNTER — Ambulatory Visit: Payer: Medicare Other | Admitting: Interventional Cardiology

## 2018-10-20 ENCOUNTER — Telehealth: Payer: Self-pay | Admitting: Emergency Medicine

## 2018-10-20 NOTE — Telephone Encounter (Signed)
Returning pt's VM.  Reporting insomnia, hot flashes, increased depression, and vaginal itching and discharge & odor after taking Anastrozole.  Spoke with MD Burr Medico, VO to stop Anastrozole until next visit on 11/04/2018.  Pt VU to instructions and to f/u as needed.

## 2018-10-21 ENCOUNTER — Telehealth: Payer: Self-pay | Admitting: Internal Medicine

## 2018-10-21 NOTE — Telephone Encounter (Signed)
I left a message asking the patient to call me at [phone] to schedule virtual AWV visit if interested. VDM (DD) °

## 2018-10-23 ENCOUNTER — Ambulatory Visit (INDEPENDENT_AMBULATORY_CARE_PROVIDER_SITE_OTHER): Payer: Medicare Other

## 2018-10-23 ENCOUNTER — Other Ambulatory Visit: Payer: Self-pay

## 2018-10-23 VITALS — Ht 59.0 in | Wt 150.0 lb

## 2018-10-23 DIAGNOSIS — Z0000 Encounter for general adult medical examination without abnormal findings: Secondary | ICD-10-CM

## 2018-10-23 NOTE — Patient Instructions (Signed)
Mariah Burns , Thank you for taking time to come for your Medicare Wellness Visit. I appreciate your ongoing commitment to your health goals. Please review the following plan we discussed and let me know if I can assist you in the future.   Screening recommendations/referrals: Colonoscopy: 01/2009 Mammogram: 07/2018 Bone Density: 09/2016 Recommended yearly ophthalmology/optometry visit for glaucoma screening and checkup Recommended yearly dental visit for hygiene and checkup  Vaccinations: Influenza vaccine: 03/2018 Pneumococcal vaccine: 12/2010 Tdap vaccine: due Shingles vaccine: discussed    Advanced directives: Please bring a copy of your POA (Power of Attorney) and/or Living Will to your next appointment.    Conditions/risks identified: Obesity  Next appointment: 12/11/2018 at 10:30   Preventive Care 74 Years and Older, Female Preventive care refers to lifestyle choices and visits with your health care provider that can promote health and wellness. What does preventive care include?  A yearly physical exam. This is also called an annual well check.  Dental exams once or twice a year.  Routine eye exams. Ask your health care provider how often you should have your eyes checked.  Personal lifestyle choices, including:  Daily care of your teeth and gums.  Regular physical activity.  Eating a healthy diet.  Avoiding tobacco and drug use.  Limiting alcohol use.  Practicing safe sex.  Taking low-dose aspirin every day.  Taking vitamin and mineral supplements as recommended by your health care provider. What happens during an annual well check? The services and screenings done by your health care provider during your annual well check will depend on your age, overall health, lifestyle risk factors, and family history of disease. Counseling  Your health care provider may ask you questions about your:  Alcohol use.  Tobacco use.  Drug use.  Emotional well-being.   Home and relationship well-being.  Sexual activity.  Eating habits.  History of falls.  Memory and ability to understand (cognition).  Work and work Statistician.  Reproductive health. Screening  You may have the following tests or measurements:  Height, weight, and BMI.  Blood pressure.  Lipid and cholesterol levels. These may be checked every 5 years, or more frequently if you are over 44 years old.  Skin check.  Lung cancer screening. You may have this screening every year starting at age 74 if you have a 30-pack-year history of smoking and currently smoke or have quit within the past 15 years.  Fecal occult blood test (FOBT) of the stool. You may have this test every year starting at age 74.  Flexible sigmoidoscopy or colonoscopy. You may have a sigmoidoscopy every 5 years or a colonoscopy every 10 years starting at age 74.  Hepatitis C blood test.  Hepatitis B blood test.  Sexually transmitted disease (STD) testing.  Diabetes screening. This is done by checking your blood sugar (glucose) after you have not eaten for a while (fasting). You may have this done every 1-3 years.  Bone density scan. This is done to screen for osteoporosis. You may have this done starting at age 74.  Mammogram. This may be done every 1-2 years. Talk to your health care provider about how often you should have regular mammograms. Talk with your health care provider about your test results, treatment options, and if necessary, the need for more tests. Vaccines  Your health care provider may recommend certain vaccines, such as:  Influenza vaccine. This is recommended every year.  Tetanus, diphtheria, and acellular pertussis (Tdap, Td) vaccine. You may need a Td booster  every 10 years.  Zoster vaccine. You may need this after age 18.  Pneumococcal 13-valent conjugate (PCV13) vaccine. One dose is recommended after age 67.  Pneumococcal polysaccharide (PPSV23) vaccine. One dose is  recommended after age 15. Talk to your health care provider about which screenings and vaccines you need and how often you need them. This information is not intended to replace advice given to you by your health care provider. Make sure you discuss any questions you have with your health care provider. Document Released: 06/17/2015 Document Revised: 02/08/2016 Document Reviewed: 03/22/2015 Elsevier Interactive Patient Education  2017 [place] Prevention in the Home Falls can cause injuries. They can happen to people of all ages. There are many things you can do to make your home safe and to help prevent falls. What can I do on the outside of my home?  Regularly fix the edges of walkways and driveways and fix any cracks.  Remove anything that might make you trip as you walk through a door, such as a raised step or threshold.  Trim any bushes or trees on the path to your home.  Use bright outdoor lighting.  Clear any walking paths of anything that might make someone trip, such as rocks or tools.  Regularly check to see if handrails are loose or broken. Make sure that both sides of any steps have handrails.  Any raised decks and porches should have guardrails on the edges.  Have any leaves, snow, or ice cleared regularly.  Use sand or salt on walking paths during winter.  Clean up any spills in your garage right away. This includes oil or grease spills. What can I do in the bathroom?  Use night lights.  Install grab bars by the toilet and in the tub and shower. Do not use towel bars as grab bars.  Use non-skid mats or decals in the tub or shower.  If you need to sit down in the shower, use a plastic, non-slip stool.  Keep the floor dry. Clean up any water that spills on the floor as soon as it happens.  Remove soap buildup in the tub or shower regularly.  Attach bath mats securely with double-sided non-slip rug tape.  Do not have throw rugs and other things on  the floor that can make you trip. What can I do in the bedroom?  Use night lights.  Make sure that you have a light by your bed that is easy to reach.  Do not use any sheets or blankets that are too big for your bed. They should not hang down onto the floor.  Have a firm chair that has side arms. You can use this for support while you get dressed.  Do not have throw rugs and other things on the floor that can make you trip. What can I do in the kitchen?  Clean up any spills right away.  Avoid walking on wet floors.  Keep items that you use a lot in easy-to-reach places.  If you need to reach something above you, use a strong step stool that has a grab bar.  Keep electrical cords out of the way.  Do not use floor polish or wax that makes floors slippery. If you must use wax, use non-skid floor wax.  Do not have throw rugs and other things on the floor that can make you trip. What can I do with my stairs?  Do not leave any items on the stairs.  Make  sure that there are handrails on both sides of the stairs and use them. Fix handrails that are broken or loose. Make sure that handrails are as long as the stairways.  Check any carpeting to make sure that it is firmly attached to the stairs. Fix any carpet that is loose or worn.  Avoid having throw rugs at the top or bottom of the stairs. If you do have throw rugs, attach them to the floor with carpet tape.  Make sure that you have a light switch at the top of the stairs and the bottom of the stairs. If you do not have them, ask someone to add them for you. What else can I do to help prevent falls?  Wear shoes that:  Do not have high heels.  Have rubber bottoms.  Are comfortable and fit you well.  Are closed at the toe. Do not wear sandals.  If you use a stepladder:  Make sure that it is fully opened. Do not climb a closed stepladder.  Make sure that both sides of the stepladder are locked into place.  Ask someone to  hold it for you, if possible.  Clearly mark and make sure that you can see:  Any grab bars or handrails.  First and last steps.  Where the edge of each step is.  Use tools that help you move around (mobility aids) if they are needed. These include:  Canes.  Walkers.  Scooters.  Crutches.  Turn on the lights when you go into a dark area. Replace any light bulbs as soon as they burn out.  Set up your furniture so you have a clear path. Avoid moving your furniture around.  If any of your floors are uneven, fix them.  If there are any pets around you, be aware of where they are.  Review your medicines with your doctor. Some medicines can make you feel dizzy. This can increase your chance of falling. Ask your doctor what other things that you can do to help prevent falls. This information is not intended to replace advice given to you by your health care provider. Make sure you discuss any questions you have with your health care provider. Document Released: 03/17/2009 Document Revised: 10/27/2015 Document Reviewed: 06/25/2014 Elsevier Interactive Patient Education  2017 [employer].

## 2018-10-23 NOTE — Progress Notes (Signed)
Subjective:   Mariah Burns is a 73 y.o. female who presents for Medicare Annual (Subsequent) preventive examination.  This visit type was conducted due to national recommendations for restrictions regarding the COVID-19 Pandemic (e.g. social distancing). This format is felt to be most appropriate for this patient at this time. All issues noted in this document were discussed and addressed. No physical exam was performed (except for noted visual exam findings with Video Visits). This patient, [name], has given permission to perform this visit via telephone. Vital signs may be absent or patient reported.  Patient location:  At home  Nurse location:  [place] office     Review of Systems:  n/a Cardiac Risk Factors include: advanced age (>19men, >46 women);diabetes mellitus;dyslipidemia;hypertension     Objective:     Vitals: Ht 4\' 11"  (1.499 m) Comment: per patient  Wt 150 lb (68 kg) Comment: per patient  BMI 30.30 kg/m   Body mass index is 30.3 kg/m.  Advanced Directives 10/23/2018 09/24/2018 09/02/2018 08/13/2018 11/21/2015 12/23/2014 11/27/2014  Does Patient Have a Medical Advance Directive? Yes No Yes Yes Yes Yes No  Type of Paramedic of [place];Living will - Healthcare Power of [place];Living will - -  Does patient want to make changes to medical advance directive? - - No - Patient declined - - No - Patient declined -  Copy of [place] in Chart? No - copy requested - No - copy requested No - copy requested - - -    Tobacco Social History   Tobacco Use  Smoking Status Never Smoker  Smokeless Tobacco Never Used     Counseling given: Not Answered   Clinical Intake:  Pre-visit preparation completed: Yes  Pain : 0-10 Pain Score: 1  Pain Type: Acute pain Pain Location: Breast Pain Orientation: Right Pain Radiating Towards: none Pain Descriptors /  Indicators: Other (Comment)(electric ) Pain Onset: More than a month ago Pain Frequency: Intermittent Pain Relieving Factors: ice helps, tramadol at night  Pain Relieving Factors: ice helps, tramadol at night  Nutritional Status: BMI > 30  Obese Nutritional Risks: None Diabetes: Yes CBG done?: No Did pt. bring in CBG monitor from home?: No  How often do you need to have someone help you when you read instructions, pamphlets, or other written materials from your doctor or pharmacy?: 1 - Never What is the last grade level you completed in school?: college  Interpreter Needed?: No  Information entered by :: NAllen LPN  Past Medical History:  Diagnosis Date  . Anxiety   . Cancer ([place]) 08/2018   right breast IDC  . Hyperlipidemia   . Hypertension   . OSA (obstructive sleep apnea)    NPSG 03/08/2010  AHI 19.8/hr, CPAP nightly  . Osteopenia   . Pre-diabetes   . 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 BREAST LUMPECTOMY WITH RADIOACTIVE SEED AND SENTINEL LYMPH NODE BIOPSY Right 09/02/2018   Procedure: RIGHT BREAST LUMPECTOMY WITH RADIOACTIVE SEED AND  RIGHT SENTINEL LYMPH NODE BIOPSY;  Surgeon: Excell Seltzer, MD;  Location: [facility];  Service: General;  Laterality: Right;  . BRONCHOSCOPY     dx'd sarcoid  . KNEE ARTHROPLASTY Left 03/13/2017  . NEUROPLASTY / TRANSPOSITION ULNAR NERVE AT ELBOW  09/2011  .  TOTAL ABDOMINAL HYSTERECTOMY     Family History  Problem Relation Age of Onset  . Allergies Sister   . Heart disease Sister   . Lupus Sister   . Allergies Mother   . Allergies Daughter   . Healthy Daughter   . Heart disease Sister   . Gout Brother   . Hypertension Brother   . Heart disease Brother   . Heart failure Father   . Gout Brother   . Hypertension Brother   . Hypertension Brother   .  Hypertension Brother   . Post-traumatic stress disorder Brother   . Healthy Daughter    Social History   Socioeconomic History  . Marital status: Divorced    Spouse name: Not on file  . Number of children: 3  . Years of education: College  . Highest education level: Not on file  Occupational History  . Occupation: Retired    Fish farm manager: RETIRED  Social Needs  . Financial resource strain: Not hard at all  . Food insecurity:    Worry: Never true    Inability: Never true  . Transportation needs:    Medical: No    Non-medical: No  Tobacco Use  . Smoking status: Never Smoker  . Smokeless tobacco: Never Used  Substance and Sexual Activity  . Alcohol use: No  . Drug use: No  . Sexual activity: Not Currently  Lifestyle  . Physical activity:    Days per week: 7 days    Minutes per session: 20 min  . Stress: Not at all  Relationships  . Social connections:    Talks on phone: Not on file    Gets together: Not on file    Attends religious service: Not on file    Active member of club or organization: Not on file    Attends meetings of clubs or organizations: Not on file    Relationship status: Not on file  Other Topics Concern  . Not on file  Social History Narrative   Patient lives at home with daughter.   Caffeine Use: none    Outpatient Encounter Medications as of 10/23/2018  Medication Sig  . [place] Oil-Levomenthol (FDGARD PO) Take 2 tablets by mouth as needed (upset stomach).  . Cholecalciferol (VITAMIN D-3 PO) Take 2,000 Units by mouth daily.  Marland Kitchen co-enzyme Q-10 30 MG capsule Take 30 mg by mouth daily.  . felodipine (PLENDIL) 10 MG 24 hr tablet TAKE 1 TABLET BY MOUTH EVERY DAY  . fish oil-omega-3 fatty acids 1000 MG capsule Take 2 capsules by mouth daily.    Marland Kitchen GLUCOSAMINE-CHONDROITIN PO Take 2 tablets by mouth daily.    Marland Kitchen L-Methylfolate-Algae-B12-B6 (METANX) 3-90.314-2-35 MG CAPS Take 1 capsule by mouth 2 (two) times daily.   . Menthol, Topical Analgesic, (BIOFREEZE  ROLL-ON EX) Apply 1 application topically as needed (pain in back,neck,thumb).  . Misc Natural Products (TART CHERRY ADVANCED PO) Take 2 tablets by mouth daily.  . nitroGLYCERIN (NITROSTAT) 0.4 MG SL tablet Place 1 tablet (0.4 mg total) under the tongue every 5 (five) minutes as needed for chest pain.  . Polyvinyl Alcohol-Povidone PF (REFRESH) 1.4-0.6 % SOLN Place 1 drop into both eyes daily as needed (dry eyes).   . pravastatin (PRAVACHOL) 40 MG tablet TAKE 1 CAPSULE BY MOUTH ON MONDAY, WEDNESDAY, FRIDAY  . traMADol (ULTRAM) 50 MG tablet Take 1 tablet (50 mg total) by mouth every 6 (six) hours as needed. (Patient taking differently: Take 25 mg by mouth every 6 (six) hours as  needed. )  . traZODone (DESYREL) 50 MG tablet Take 50 mg by mouth at bedtime.  Marland Kitchen anastrozole (ARIMIDEX) 1 MG tablet Take 1 tablet (1 mg total) by mouth daily. (Patient not taking: Reported on 10/23/2018)  . aspirin 81 MG chewable tablet Chew by mouth daily. Not taking daily  . clotrimazole (LOTRIMIN) 1 % cream Apply 1 application topically as needed (itching).   No facility-administered encounter medications on file as of 10/23/2018.     Activities of Daily Living In your present state of health, do you have any difficulty performing the following activities: 10/23/2018 09/02/2018  Hearing? N N  Vision? Y N  Comment needs to take glasses off to read -  Difficulty concentrating or making decisions? Y N  Walking or climbing stairs? N N  Dressing or bathing? N N  Doing errands, shopping? N -  Preparing Food and eating ? N -  Using the Toilet? N -  In the past six months, have you accidently leaked urine? N -  Do you have problems with loss of bowel control? N -  Managing your Medications? N -  Managing your Finances? N -  Housekeeping or managing your Housekeeping? N -  Some recent data might be hidden    Patient Care Team: Glendale Chard, MD as PCP - General (Internal Medicine) Belva Crome, MD as PCP - Cardiology  (Cardiology) Mauro Kaufmann, RN as Oncology Nurse Navigator Rockwell Germany, RN as Oncology Nurse Navigator Excell Seltzer, MD as Consulting Physician (General Surgery) Truitt Merle, MD as Consulting Physician (Hematology) Gery Pray, MD as Consulting Physician (Radiation Oncology)    Assessment:   This is a routine wellness examination for Mariah Burns.  Exercise Activities and Dietary recommendations Current Exercise Habits: Home exercise routine, Type of exercise: walking, Time (Minutes): 15, Frequency (Times/Week): 7, Weekly Exercise (Minutes/Week): 105, Intensity: Moderate  Goals    . Weight (lb) < 200 lb (90.7 kg)     Just wants to lose some weight no real set goal       Fall Risk Fall Risk  10/23/2018 06/30/2018 05/15/2018 12/23/2014  Falls in the past year? 0 0 0 No  Risk for fall due to : Medication side effect - - -  Follow up Falls prevention discussed - - -   Is the patient's home free of loose throw rugs in walkways, pet beds, electrical cords, etc?   yes      Grab bars in the bathroom? no      Handrails on the stairs?   yes      Adequate lighting?   yes  Timed Get Up and Go performed: n/a  Depression Screen PHQ 2/9 Scores 10/23/2018 06/30/2018 05/15/2018 01/03/2015  PHQ - 2 Score 0 0 5 0  PHQ- 9 Score 3 - 22 -     Cognitive Function     6CIT Screen 10/23/2018  What Year? 0 points  What month? 0 points  What time? 0 points  Count back from 20 0 points  Months in reverse 0 points  Repeat phrase 0 points  Total Score 0    Immunization History  Administered Date(s) Administered  . Influenza Split 04/10/2011, 04/15/2012, 03/04/2013, 03/04/2014, 03/05/2015, 03/04/2017  . Influenza Whole 06/13/2010  . Influenza,inj,Quad PF,6+ Mos 03/22/2016  . Influenza-Unspecified 03/22/2016, 03/04/2018  . Pneumococcal Polysaccharide-23 06/04/2009  . Td 05/31/2008    Qualifies for Shingles Vaccine? yes  Screening Tests Health Maintenance  Topic Date Due  . FOOT EXAM   [birth year]01/1956  .  OPHTHALMOLOGY EXAM  04[birth year]01/1956  . URINE MICROALBUMIN  [birth year]01/1955  . PNA vac Low Risk Adult (2 of 2 - PPSV23) 06/04/2014  . TETANUS/TDAP  05/31/2018  . HEMOGLOBIN A1C  11/14/2018  . INFLUENZA VACCINE  01/03/2019  . COLONOSCOPY  01/27/2019  . MAMMOGRAM  01/24/2020  . DEXA SCAN  Completed  . Hepatitis C Screening  Completed    Cancer Screenings: Lung: Low Dose CT Chest recommended if Age 19-80 years, 30 pack-year currently smoking OR have quit w/in 15years. Patient does not qualify. Breast:  Up to date on Mammogram? Yes   Up to date of Bone Density/Dexa? Yes Colorectal: up to date   Additional Screenings: : Hepatitis C Screening: 05/23/2018     Plan:    Wants to lose some weight.   I have personally reviewed and noted the following in the patient's chart:   . Medical and social history . Use of alcohol, tobacco or illicit drugs  . Current medications and supplements . Functional ability and status . Nutritional status . Physical activity . Advanced directives . List of other physicians . Hospitalizations, surgeries, and ER visits in previous 12 months . Vitals . Screenings to include cognitive, depression, and falls . Referrals and appointments  In addition, I have reviewed and discussed with patient certain preventive protocols, quality metrics, and best practice recommendations. A written personalized care plan for preventive services as well as general preventive health recommendations were provided to patient.     Kellie Simmering, LPN  6/55/3748

## 2018-11-04 ENCOUNTER — Ambulatory Visit
Admission: RE | Admit: 2018-11-04 | Discharge: 2018-11-04 | Disposition: A | Discharge status: Home / Self Care | Payer: Medicare Other | Source: Ambulatory Visit | Attending: Radiation Oncology | Admitting: Radiation Oncology

## 2018-11-04 ENCOUNTER — Other Ambulatory Visit: Payer: Self-pay

## 2018-11-04 ENCOUNTER — Inpatient Hospital Stay: Payer: Medicare Other | Admitting: Nurse Practitioner

## 2018-11-04 ENCOUNTER — Other Ambulatory Visit: Payer: Self-pay | Admitting: Nurse Practitioner

## 2018-11-04 ENCOUNTER — Encounter: Payer: Self-pay | Admitting: Nurse Practitioner

## 2018-11-04 ENCOUNTER — Inpatient Hospital Stay: Payer: Medicare Other | Attending: Hematology

## 2018-11-04 VITALS — BP 152/66 | HR 65 | Temp 98.1°F | Resp 17 | Ht 59.0 in | Wt 148.8 lb

## 2018-11-04 DIAGNOSIS — C50411 Malignant neoplasm of upper-outer quadrant of right female breast: Secondary | ICD-10-CM

## 2018-11-04 DIAGNOSIS — I10 Essential (primary) hypertension: Secondary | ICD-10-CM

## 2018-11-04 DIAGNOSIS — Z170 Estrogen receptor positive status [ER+]: Secondary | ICD-10-CM | POA: Diagnosis not present

## 2018-11-04 DIAGNOSIS — N951 Menopausal and female climacteric states: Secondary | ICD-10-CM | POA: Diagnosis not present

## 2018-11-04 DIAGNOSIS — N898 Other specified noninflammatory disorders of vagina: Secondary | ICD-10-CM | POA: Insufficient documentation

## 2018-11-04 DIAGNOSIS — E119 Type 2 diabetes mellitus without complications: Secondary | ICD-10-CM

## 2018-11-04 DIAGNOSIS — N644 Mastodynia: Secondary | ICD-10-CM

## 2018-11-04 DIAGNOSIS — G4700 Insomnia, unspecified: Secondary | ICD-10-CM | POA: Diagnosis not present

## 2018-11-04 DIAGNOSIS — F329 Major depressive disorder, single episode, unspecified: Secondary | ICD-10-CM

## 2018-11-04 LAB — CBC WITH DIFFERENTIAL (CANCER CENTER ONLY)
Abs Immature Granulocytes: 0.01 10*3/uL (ref 0.00–0.07)
Basophils Absolute: 0 10*3/uL (ref 0.0–0.1)
Basophils Relative: 1 %
Eosinophils Absolute: 0.2 10*3/uL (ref 0.0–0.5)
Eosinophils Relative: 3 %
HCT: 36.2 % (ref 36.0–46.0)
Hemoglobin: 11.7 g/dL — ABNORMAL LOW (ref 12.0–15.0)
Immature Granulocytes: 0 %
Lymphocytes Relative: 34 %
Lymphs Abs: 1.5 10*3/uL (ref 0.7–4.0)
MCH: 28.3 pg (ref 26.0–34.0)
MCHC: 32.3 g/dL (ref 30.0–36.0)
MCV: 87.7 fL (ref 80.0–100.0)
Monocytes Absolute: 0.5 10*3/uL (ref 0.1–1.0)
Monocytes Relative: 11 %
Neutro Abs: 2.2 10*3/uL (ref 1.7–7.7)
Neutrophils Relative %: 51 %
Platelet Count: 194 10*3/uL (ref 150–400)
RBC: 4.13 MIL/uL (ref 3.87–5.11)
RDW: 13.5 % (ref 11.5–15.5)
WBC Count: 4.4 10*3/uL (ref 4.0–10.5)
nRBC: 0 % (ref 0.0–0.2)

## 2018-11-04 LAB — CMP (CANCER CENTER ONLY)
ALT: 16 U/L (ref 0–44)
AST: 20 U/L (ref 15–41)
Albumin: 3.9 g/dL (ref 3.5–5.0)
Alkaline Phosphatase: 73 U/L (ref 38–126)
Anion gap: 9 (ref 5–15)
BUN: 12 mg/dL (ref 8–23)
CO2: 27 mmol/L (ref 22–32)
Calcium: 9.8 mg/dL (ref 8.9–10.3)
Chloride: 106 mmol/L (ref 98–111)
Creatinine: 0.96 mg/dL (ref 0.44–1.00)
GFR, Est AFR Am: >60 mL/min (ref >60)
GFR, Estimated: 59 mL/min — ABNORMAL LOW (ref >60)
Glucose, Bld: 92 mg/dL (ref 70–99)
Potassium: 3.5 mmol/L (ref 3.5–5.1)
Sodium: 142 mmol/L (ref 135–145)
Total Bilirubin: 0.4 mg/dL (ref 0.3–1.2)
Total Protein: 7.8 g/dL (ref 6.5–8.1)

## 2018-11-04 NOTE — Progress Notes (Signed)
  Radiation Oncology         [phone] ________________________________  Name: Mariah Burns MRN: [id number]  Date: 11/04/2018  DOB: 09-25-1945  SIMULATION AND TREATMENT PLANNING NOTE    ICD-10-CM   1. Malignant neoplasm of upper-outer quadrant of right breast in female, estrogen receptor positive ([place]) C50.411    Z17.0     DIAGNOSIS:  StageIA (anatomic stage IIB) (pT2, pN1a, Mx) RightBreast UOQ Invasive Ductal Carcinoma, ER+/ PR+/ Her2-, Grade1  NARRATIVE:  The patient was brought to the [facility].  Identity was confirmed.  All relevant records and images related to the planned course of therapy were reviewed.  The patient freely provided informed written consent to proceed with treatment after reviewing the details related to the planned course of therapy. The consent form was witnessed and verified by the simulation staff.  Then, the patient was set-up in a stable reproducible  supine position for radiation therapy.  CT images were obtained.  Surface markings were placed.  The CT images were loaded into the planning software.  Then the target and avoidance structures were contoured.  Treatment planning then occurred.  The radiation prescription was entered and confirmed.  Then, I designed and supervised the construction of a total of 7 medically necessary complex treatment devices.  I have requested : 3D Simulation  I have requested a DVH of the following structures:,heart,  Lungs, lumpectomy cavity.  I have ordered:dose calc.  PLAN:  The patient will receive 50.4 Gy in 28 fractions directed at the right breast area.  The axillary region will receive 45 Gy in 25 fractions.  Patient will then proceed with a boost to the lumpectomy cavity of 10 Gray in 5 fractions for a cumulative dose of 60.4 Gray.    Optical Surface Tracking Plan:  Since intensity modulated radiotherapy (IMRT) and 3D conformal radiation treatment methods are predicated on accurate and precise  positioning for treatment, intrafraction motion monitoring is medically necessary to ensure accurate and safe treatment delivery.  The ability to quantify intrafraction motion without excessive ionizing radiation dose can only be performed with optical surface tracking. Accordingly, surface imaging offers the opportunity to obtain 3D measurements of patient position throughout IMRT and 3D treatments without excessive radiation exposure.  I am ordering optical surface tracking for this patient's upcoming course of radiotherapy. _______________________________    Blair Promise, PhD, MD

## 2018-11-04 NOTE — Progress Notes (Signed)
[place]   [phone] [phone]   Clinic Follow up Note   Patient Care Team: Glendale Chard, MD as PCP - General (Internal Medicine) Belva Crome, MD as PCP - Cardiology (Cardiology) Mauro Kaufmann, RN as Oncology Nurse Navigator Rockwell Germany, RN as Oncology Nurse Navigator Excell Seltzer, MD as Consulting Physician (General Surgery) Truitt Merle, MD as Consulting Physician (Hematology) Gery Pray, MD as Consulting Physician (Radiation Oncology) 11/04/2018  CHIEF COMPLAINT: f/u Right breast cancer   SUMMARY OF ONCOLOGIC HISTORY: Oncology History   Cancer Staging Malignant neoplasm of upper-outer quadrant of right breast in female, estrogen receptor positive ([place]) Staging form: Breast, AJCC 8th Edition - Clinical stage from 08/04/2018: Stage IA (cT1c, cN0, cM0, G2, ER+, PR+, HER2-) - Signed by Truitt Merle, MD on 08/12/2018 - Pathologic stage from 09/02/2018: Stage IA (pT2, pN1a, cM0, G1, ER+, PR+, HER2-) - Signed by Truitt Merle, MD on 09/29/2018       Malignant neoplasm of upper-outer quadrant of right breast in female, estrogen receptor positive ([place])   07/29/2018 Mammogram    Diagnostic Mammgram 07/29/18  IMPRESSION:  The 1.7cm oval mass in the right breast upper outer qudrant middle third, 3-5 from nipple is suspicious of malignancy.     08/04/2018 Cancer Staging    Staging form: Breast, AJCC 8th Edition - Clinical stage from 08/04/2018: Stage IA (cT1c, cN0, cM0, G2, ER+, PR+, HER2-) - Signed by Truitt Merle, MD on 08/12/2018    08/04/2018 Initial Biopsy    Diagnosis 08/04/18  Breast, right, needle core biopsy, 1.7 cm irregular solid mass at 9 o'clock, 4 cm fn - INVASIVE DUCTAL CARCINOMA WITH EXTRACELLULAR MUCIN.    08/04/2018 Receptors her2    Results: IMMUNOHISTOCHEMICAL AND MORPHOMETRIC ANALYSIS PERFORMED MANUALLY The tumor cells are NEGATIVE for Her2 (0). Estrogen Receptor: 100%, POSITIVE, STRONG STAINING INTENSITY Progesterone Receptor:  100%, POSITIVE, STRONG STAINING INTENSITY Proliferation Marker Ki67: 10%    08/11/2018 Initial Diagnosis    Malignant neoplasm of upper-outer quadrant of right breast in female, estrogen receptor positive ([place])    09/02/2018 Surgery    RIGHT BREAST LUMPECTOMY WITH RADIOACTIVE SEED AND  RIGHT SENTINEL LYMPH NODE BIOPSY by Dr. Excell Seltzer  09/02/18     09/02/2018 Pathology Results    Diagnosis 1. Breast, lumpectomy, Right w/seed - INVASIVE DUCTAL CARCINOMA WITH EXTRACELLULAR MUCIN, GRADE I, 2.1 CM. - DUCTAL CARCINOMA IN SITU, INTERMEDIATE NUCLEAR GRADE. - INVASIVE CARCINOMA IS 2 MM FROM SUPERIOR MARGIN AND 2.5 MM FROM LATERAL MARGIN. - DUCTAL CARCINOMA IN SITU IS LESS THAN 1 MM FROM ANTERIOR MARGIN, 4 MM FROM SUPERIOR AND LATERAL MARGINS, 5 MM FROM INFERIOR MARGIN AND 8 MM FROM THE POSTERIOR MARGIN. - SMALL INTRADUCTAL PAPILLOMA. - NO EVIDENCE OF LYMPHOVASCULAR OR PERINEURAL INVASION. - BIOPSY SITE CHANGES. - SEE ONCOLOGY TABLE. 2. Lymph node, sentinel, biopsy, Right Axillary - LYMPH NODE, NEGATIVE FOR CARCINOMA (0/1). 3. Lymph node, sentinel, biopsy, Right - LYMPH NODE, NEGATIVE FOR CARCINOMA (0/1). 4. Lymph node, sentinel, biopsy, Right - METASTATIC BREAST CARCINOMA TO ONE LYMPH NODE (1/1). - METASTATIC FOCUS MEASURES 0.4 CM IN GREATEST DIMENSION. - NO DEFINITE EVIDENCE OF EXTRANODAL EXTENSION.    09/02/2018 Miscellaneous    MammaPrint  09/02/18 Low risk with 10% average 10-year risk of recurrnce untreated  Mammaprint Index: +0.287 97.8% of patients treated with hormonal therapy have no distnat recurrance.     09/02/2018 Cancer Staging    Staging form: Breast, AJCC 8th Edition - Pathologic stage from 09/02/2018: Stage IA (pT2,  pN1a, cM0, G1, ER+, PR+, HER2-) - Signed by Truitt Merle, MD on 09/29/2018    10/05/2018 -  Anti-estrogen oral therapy    Anastrozole 46m daily starting 10/05/18; stopped 5/18 due to insomnia, hot flashes, depression, and vaginal irritation     CURRENT THERAPY:  Anastrozole 133mdaily starting 10/05/18; stopped 5/18 due to insomnia, hot flashes, depression, and vaginal irritation   INTERVAL HISTORY: Ms. ThHenrieturns for follow up as scheduled. She began anastrozole 10/05/18. Almost immediately she reports insomnia, hot flashes, depression, and vaginal irritation. She called and was instructed to stop on 5/18. Since then her symptoms have improved. Still has vaginal odor. Sometimes has "little sting" when she urinates. Denies vaginal or other bleeding. Hot flashes are still present, moderate, but tolerable. Her mood has improved but she remains depressed about the state of the world and [place] events. Has joint pain in her knees and back present before starting AI, not worsened. She remains active, walks outside and plants in her yard. Appetite is normal. No change in bowel habits. Continues to have intermittent shooting right breast pain across the right side to nipple, she sleeps with an ice pack and takes 1/2 tab tramadol PRN at night which helps. Denies fever, chills, cough, chest pain, dyspnea, or leg edema or lymphedema.     MEDICAL HISTORY:  Past Medical History:  Diagnosis Date  . Anxiety   . [place]   right breast IDC  . Hyperlipidemia   . Hypertension   . OSA (obstructive sleep apnea)    NPSG 03/08/2010  AHI 19.8/hr, CPAP nightly  . Osteopenia   . Pre-diabetes   . 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 BREAST LUMPECTOMY WITH RADIOACTIVE SEED AND SENTINEL LYMPH NODE BIOPSY Right 09/02/2018   Procedure: RIGHT BREAST LUMPECTOMY WITH RADIOACTIVE SEED AND  RIGHT SENTINEL LYMPH NODE BIOPSY;  Surgeon: HoExcell SeltzerMD;  [facility];  Laterality: Right;  . BRONCHOSCOPY     dx'd sarcoid  . KNEE ARTHROPLASTY Left  03/13/2017  . NEUROPLASTY / TRANSPOSITION ULNAR NERVE AT ELBOW  09/2011  . TOTAL ABDOMINAL HYSTERECTOMY      I have reviewed the social history and family history with the patient and they are unchanged from previous note.  ALLERGIES:  is allergic to fosamax [alendronate sodium]; omeprazole; motrin [ibuprofen]; and fluvastatin.  MEDICATIONS:  Current Outpatient Medications  Medication Sig Dispense Refill  . aspirin 81 MG chewable tablet Chew by mouth daily. Not taking daily    . [place] Oil-Levomenthol (FDGARD PO) Take 2 tablets by mouth as needed (upset stomach).    . Cholecalciferol (VITAMIN D-3 PO) Take 2,000 Units by mouth daily.    . clotrimazole (LOTRIMIN) 1 % cream Apply 1 application topically as needed (itching).    . Marland Kitcheno-enzyme Q-10 30 MG capsule Take 30 mg by mouth daily.    . felodipine (PLENDIL) 10 MG 24 hr tablet TAKE 1 TABLET BY MOUTH EVERY DAY 90 tablet 2  . fish oil-omega-3 fatty acids 1000 MG capsule Take 2 capsules by mouth daily.      . Marland KitchenLUCOSAMINE-CHONDROITIN PO Take 2 tablets by mouth daily.      . Marland Kitchen-Methylfolate-Algae-B12-B6 (METANX) 3-90.314-2-35 MG CAPS Take 1 capsule by mouth 2 (two) times daily.     .Marland Kitchen  Menthol, Topical Analgesic, (BIOFREEZE ROLL-ON EX) Apply 1 application topically as needed (pain in back,neck,thumb).    . Misc Natural Products (TART CHERRY ADVANCED PO) Take 2 tablets by mouth daily.    . nitroGLYCERIN (NITROSTAT) 0.4 MG SL tablet Place 1 tablet (0.4 mg total) under the tongue every 5 (five) minutes as needed for chest pain. 25 tablet 2  . Polyvinyl Alcohol-Povidone PF (REFRESH) 1.4-0.6 % SOLN Place 1 drop into both eyes daily as needed (dry eyes).     . pravastatin (PRAVACHOL) 40 MG tablet TAKE 1 CAPSULE BY MOUTH ON MONDAY, WEDNESDAY, FRIDAY 30 tablet 1  . traMADol (ULTRAM) 50 MG tablet Take 1 tablet (50 mg total) by mouth every 6 (six) hours as needed. (Patient taking differently: Take 25 mg by mouth every 6 (six) hours as needed. ) 15 tablet 1  .  traZODone (DESYREL) 50 MG tablet Take 50 mg by mouth at bedtime.     No current facility-administered medications for this visit.     PHYSICAL EXAMINATION: ECOG PERFORMANCE STATUS: 1 - Symptomatic but completely ambulatory  Vitals:   11/04/18 1123 11/04/18 1129  BP: (!) 162/69 (!) 152/66  Pulse: 65   Resp: 17   Temp: 98.1 F (36.7 C)   SpO2: 100%    Filed Weights   11/04/18 1123  Weight: 148 lb 12.8 oz (67.5 kg)    GENERAL:alert, no distress and comfortable SKIN: no obvious rash  EYES: sclera clear LYMPH:  no palpable cervical, supraclavicular, or axillary lymphadenopathy  LUNGS: respirations even and unlabored  HEART: no lower extremity edema NEURO: alert & oriented x 3 with fluent speech, normal gait Breast: s/p right lumpectomy. Axillary and circumareolar incisions are well healed. 2 cm soft tissue lumpiness/firmness in right lateral breast near the nipple. No mass in left breast or either axilla.  LABORATORY DATA:  I have reviewed the data as listed CBC Latest Ref Rng & Units 11/04/2018 08/13/2018 11/27/2014  WBC 4.0 - 10.5 K/uL 4.4 4.6 4.5  Hemoglobin 12.0 - 15.0 g/dL 11.7(L) 12.0 12.2  Hematocrit 36.0 - 46.0 % 36.2 37.4 36.4  Platelets 150 - 400 K/uL 194 204 206     CMP Latest Ref Rng & Units 11/04/2018 08/13/2018 06/30/2018  Glucose 70 - 99 mg/dL 92 110(H) -  BUN 8 - 23 mg/dL 12 12 -  Creatinine 0.44 - 1.00 mg/dL 0.96 0.91 -  Sodium 135 - 145 mmol/L 142 143 -  Potassium 3.5 - 5.1 mmol/L 3.5 3.5 -  Chloride 98 - 111 mmol/L 106 107 -  CO2 22 - 32 mmol/L 27 26 -  Calcium 8.9 - 10.3 mg/dL 9.8 9.5 -  Total Protein 6.5 - 8.1 g/dL 7.8 8.2(H) 7.5  Total Bilirubin 0.3 - 1.2 mg/dL 0.4 0.5 0.4  Alkaline Phos 38 - 126 U/L 73 79 68  AST 15 - 41 U/L _0 ALT 0 - 44 U/L _1 RADIOGRAPHIC STUDIES: I have personally reviewed the radiological images as listed and agreed with the findings in the report. No results found.   ASSESSMENT & PLAN: BRITTIE WHISNANT  is a 73 y.o. female with   1. Malignant neoplasm of upper-outer quadrant of right breast, Stage IA, pT2,N0,M0, ER/PR+, HER2-, Grade I, Mammaprint low risk  -Diagnosed in 08/2018. She is s/p right lumpectomy with SLNB on 09/02/18  -mammaprint showed low risk, no benefit of adjuvant chemotherapy -she continues to recover well from surgery, she still has intermittent shooting  breast pain, controlled with ice pack and tramadol PRN -she saw Dr. Lucia Gaskins in 09/2018, next f/u in 6 months  -She began adjuvant endocrine therapy with anastrozole on 10/05/18 in the interim while awaiting radiation. She did not tolerate well, with insomnia, hot flash, depression, and vaginal irritation. She ultimately stopped, symptoms have somewhat improved.  -I discussed resuming anti-estrogen therapy after radiation, including options such as tamoxifen or letrozole. I reviewed the goal is to reduce risk of distant recurrence. I discussed potential side effects, including increased cardiovascular risk with tamoxifen. She has had hysterectomy, no concern for endometrial cancer. She will think about it.  -she appears well today. Breast exam shows 2 cm area of firmness in the right lateral breast, she still has some tenderness. Likely scar tissue vs hematoma/seroma. I suggest [place] to explore. However, she wants to hold until she sees Dr. Sondra Come later today. -labs reviewed, Hgb 11.7; otherwise CBC and CMP unremarkable -she will proceed with CT sim later today per Dr. Sondra Come.  -f/u with Dr. Burr Medico at the end of radiation to finalize plan for anti-estrogen and begin surveillance   3. Hot flash, insomnia, depression -developed after beginning AI -symptoms have improved since discontinuing medication on 5/18  -hot flashes are moderate but tolerable, improving  -she is hesitant to consider anti-estrogen therapy due to side effects, will hold for now -she remains very upset by [place]/world events, I provided emotional support  -she has family and  social support if needed   2. Bone health  -I discussed anastrozole can weaken her bone.  -DEXA on 7/30  3. Genetic Testing  -She is not very aware of her family history of cancer.  -Given she has 3 daughters, she will consider genetic testing even if out of pocket.   4. HTN and DM -overall controlled, continue to f/u with PCP  -she has noticed occasional dizziness at home. Question related to tramadol vs BP -I encouraged her to remain adequately hydrated, and check BP when dizzy -hold BP meds if low, she agrees   PLAN:  -labs reviewed  -proceed with adjuvant RT  -f/u with Dr. Burr Medico in 6 weeks to discuss endocrine therapy   All questions were answered. The patient knows to call the clinic with any problems, questions or concerns. No barriers to learning was detected.     Alla Feeling, NP 11/04/18

## 2018-11-05 ENCOUNTER — Telehealth: Payer: Self-pay | Admitting: Nurse Practitioner

## 2018-11-05 ENCOUNTER — Encounter: Payer: Self-pay | Admitting: *Deleted

## 2018-11-05 NOTE — Telephone Encounter (Signed)
Scheduled appt per 6/2 los. ° °Left a voice message of appt date and time. °

## 2018-11-10 DIAGNOSIS — C50411 Malignant neoplasm of upper-outer quadrant of right female breast: Secondary | ICD-10-CM | POA: Diagnosis not present

## 2018-11-11 ENCOUNTER — Other Ambulatory Visit: Payer: Self-pay

## 2018-11-11 ENCOUNTER — Ambulatory Visit
Admission: RE | Admit: 2018-11-11 | Discharge: 2018-11-11 | Disposition: A | Discharge status: Home / Self Care | Payer: Medicare Other | Source: Ambulatory Visit | Attending: Radiation Oncology | Admitting: Radiation Oncology

## 2018-11-11 DIAGNOSIS — C50411 Malignant neoplasm of upper-outer quadrant of right female breast: Secondary | ICD-10-CM | POA: Diagnosis not present

## 2018-11-11 DIAGNOSIS — Z170 Estrogen receptor positive status [ER+]: Secondary | ICD-10-CM

## 2018-11-11 NOTE — Progress Notes (Signed)
  Radiation Oncology         [phone] ________________________________  Name: Mariah Burns MRN: [id number]  Date: 11/11/2018  DOB: [birth year]  Simulation Verification Note    ICD-10-CM   1. Malignant neoplasm of upper-outer quadrant of right breast in female, estrogen receptor positive ([place]) C50.411    Z17.0     Status: outpatient  NARRATIVE: The patient was brought to the treatment unit and placed in the planned treatment position. The clinical setup was verified. Then port films were obtained and uploaded to the radiation oncology medical record software.  The treatment beams were carefully compared against the planned radiation fields. The position location and shape of the radiation fields was reviewed. They targeted volume of tissue appears to be appropriately covered by the radiation beams. Organs at risk appear to be excluded as planned.  Based on my personal review, I approved the simulation verification. The patient's treatment will proceed as planned.  -----------------------------------  Blair Promise, PhD, MD

## 2018-11-12 ENCOUNTER — Ambulatory Visit
Admission: RE | Admit: 2018-11-12 | Discharge: 2018-11-12 | Disposition: A | Discharge status: Home / Self Care | Payer: Medicare Other | Source: Ambulatory Visit | Attending: Radiation Oncology | Admitting: Radiation Oncology

## 2018-11-12 ENCOUNTER — Other Ambulatory Visit: Payer: Self-pay

## 2018-11-12 DIAGNOSIS — Z170 Estrogen receptor positive status [ER+]: Secondary | ICD-10-CM

## 2018-11-12 DIAGNOSIS — C50411 Malignant neoplasm of upper-outer quadrant of right female breast: Secondary | ICD-10-CM

## 2018-11-12 MED ORDER — ALRA NON-METALLIC DEODORANT (RAD-ONC)
1.0000 "application " | Freq: Once | TOPICAL | Status: AC
  Administered 2018-11-12: 1 via TOPICAL

## 2018-11-12 MED ORDER — RADIAPLEXRX EX GEL
Freq: Once | CUTANEOUS | Status: AC
  Administered 2018-11-12: 16:00:00 via TOPICAL

## 2018-11-12 NOTE — Progress Notes (Signed)
Pt here for patient teaching.  Pt given Radiation and You booklet, skin care instructions, Alra deodorant and Radiaplex gel.  Reviewed areas of pertinence such as fatigue, hair loss, skin changes, breast tenderness and breast swelling . Pt able to give teach back of to pat skin and use unscented/gentle soap,apply Radiaplex bid, avoid applying anything to skin within 4 hours of treatment, avoid wearing an under wire bra and to use an electric razor if they must shave. Pt verbalized understanding of information given and will contact nursing with any questions or concerns.     Chloris Marcoux M. Dexter Sauser RN, BSN  

## 2018-11-13 ENCOUNTER — Other Ambulatory Visit: Payer: Self-pay

## 2018-11-13 ENCOUNTER — Ambulatory Visit
Admission: RE | Admit: 2018-11-13 | Discharge: 2018-11-13 | Disposition: A | Discharge status: Home / Self Care | Payer: Medicare Other | Source: Ambulatory Visit | Attending: Radiation Oncology | Admitting: Radiation Oncology

## 2018-11-13 DIAGNOSIS — C50411 Malignant neoplasm of upper-outer quadrant of right female breast: Secondary | ICD-10-CM | POA: Diagnosis not present

## 2018-11-14 ENCOUNTER — Other Ambulatory Visit: Payer: Self-pay

## 2018-11-14 ENCOUNTER — Ambulatory Visit
Admission: RE | Admit: 2018-11-14 | Discharge: 2018-11-14 | Disposition: A | Discharge status: Home / Self Care | Payer: Medicare Other | Source: Ambulatory Visit | Attending: Radiation Oncology | Admitting: Radiation Oncology

## 2018-11-14 DIAGNOSIS — C50411 Malignant neoplasm of upper-outer quadrant of right female breast: Secondary | ICD-10-CM | POA: Diagnosis not present

## 2018-11-17 ENCOUNTER — Ambulatory Visit
Admission: RE | Admit: 2018-11-17 | Discharge: 2018-11-17 | Disposition: A | Discharge status: Home / Self Care | Payer: Medicare Other | Source: Ambulatory Visit | Attending: Radiation Oncology | Admitting: Radiation Oncology

## 2018-11-17 ENCOUNTER — Other Ambulatory Visit: Payer: Self-pay

## 2018-11-17 DIAGNOSIS — C50411 Malignant neoplasm of upper-outer quadrant of right female breast: Secondary | ICD-10-CM | POA: Diagnosis not present

## 2018-11-18 ENCOUNTER — Ambulatory Visit
Admission: RE | Admit: 2018-11-18 | Discharge: 2018-11-18 | Disposition: A | Discharge status: Home / Self Care | Payer: Medicare Other | Source: Ambulatory Visit | Attending: Radiation Oncology | Admitting: Radiation Oncology

## 2018-11-18 ENCOUNTER — Other Ambulatory Visit: Payer: Self-pay

## 2018-11-18 DIAGNOSIS — C50411 Malignant neoplasm of upper-outer quadrant of right female breast: Secondary | ICD-10-CM | POA: Diagnosis not present

## 2018-11-19 ENCOUNTER — Ambulatory Visit
Admission: RE | Admit: 2018-11-19 | Discharge: 2018-11-19 | Disposition: A | Discharge status: Home / Self Care | Payer: Medicare Other | Source: Ambulatory Visit | Attending: Radiation Oncology | Admitting: Radiation Oncology

## 2018-11-19 ENCOUNTER — Other Ambulatory Visit: Payer: Self-pay

## 2018-11-19 DIAGNOSIS — C50411 Malignant neoplasm of upper-outer quadrant of right female breast: Secondary | ICD-10-CM | POA: Diagnosis not present

## 2018-11-20 ENCOUNTER — Ambulatory Visit
Admission: RE | Admit: 2018-11-20 | Discharge: 2018-11-20 | Disposition: A | Discharge status: Home / Self Care | Payer: Medicare Other | Source: Ambulatory Visit | Attending: Radiation Oncology | Admitting: Radiation Oncology

## 2018-11-20 ENCOUNTER — Other Ambulatory Visit: Payer: Self-pay

## 2018-11-20 DIAGNOSIS — C50411 Malignant neoplasm of upper-outer quadrant of right female breast: Secondary | ICD-10-CM | POA: Diagnosis not present

## 2018-11-21 ENCOUNTER — Other Ambulatory Visit: Payer: Self-pay

## 2018-11-21 ENCOUNTER — Ambulatory Visit
Admission: RE | Admit: 2018-11-21 | Discharge: 2018-11-21 | Disposition: A | Discharge status: Home / Self Care | Payer: Medicare Other | Source: Ambulatory Visit | Attending: Radiation Oncology | Admitting: Radiation Oncology

## 2018-11-21 DIAGNOSIS — C50411 Malignant neoplasm of upper-outer quadrant of right female breast: Secondary | ICD-10-CM | POA: Diagnosis not present

## 2018-11-24 ENCOUNTER — Ambulatory Visit (INDEPENDENT_AMBULATORY_CARE_PROVIDER_SITE_OTHER): Payer: Medicare Other

## 2018-11-24 ENCOUNTER — Ambulatory Visit
Admission: RE | Admit: 2018-11-24 | Discharge: 2018-11-24 | Disposition: A | Discharge status: Home / Self Care | Payer: Medicare Other | Source: Ambulatory Visit | Attending: Radiation Oncology | Admitting: Radiation Oncology

## 2018-11-24 ENCOUNTER — Other Ambulatory Visit: Payer: Self-pay

## 2018-11-24 ENCOUNTER — Ambulatory Visit: Payer: Medicare Other | Admitting: Internal Medicine

## 2018-11-24 ENCOUNTER — Encounter: Payer: Self-pay | Admitting: Internal Medicine

## 2018-11-24 VITALS — BP 124/58 | HR 63 | Temp 98.5°F | Ht 59.0 in | Wt 146.8 lb

## 2018-11-24 DIAGNOSIS — G4733 Obstructive sleep apnea (adult) (pediatric): Secondary | ICD-10-CM

## 2018-11-24 DIAGNOSIS — C50411 Malignant neoplasm of upper-outer quadrant of right female breast: Secondary | ICD-10-CM

## 2018-11-24 DIAGNOSIS — Z170 Estrogen receptor positive status [ER+]: Secondary | ICD-10-CM

## 2018-11-24 DIAGNOSIS — D869 Sarcoidosis, unspecified: Secondary | ICD-10-CM | POA: Diagnosis not present

## 2018-11-24 DIAGNOSIS — C50911 Malignant neoplasm of unspecified site of right female breast: Secondary | ICD-10-CM

## 2018-11-24 NOTE — Patient Instructions (Addendum)
Order- DME [facility] - please change CPAP to auto 5-15, mask of choice, humidifier, supplies, AirView/ card.  Order- CXR   Dx Sarcoid, R breast cancer  Please call if we can help

## 2018-11-24 NOTE — Progress Notes (Signed)
Subjective:    Patient ID: Mariah Burns, female    DOB: 04/16/1946, 73 y.o.   MRN: [id number]  HPI female never smoker followed for OSA, Sarcoid, complicated by HBP, DM NPSG 03/08/2010  AHI 19.8/hr  Office spirometry 09/20/15- WNL-FEV1/FVC 0.85 ------------------------------------------------------------------------------------------ 11/22/17- 73 year old female never smoker followed for OSA, Sarcoid, complicated by HBP, DM CPAP 7/[facility] -----Sarcoidosis and OSA: DME [facility]. Pt states her breathing is doing well and wears CPAP nightly-DL attached and will need order for new supplies.  Download compliance 90%, AHI 3.7/ hr.  Sleeps better with CPAP but cites insomnia, waking 2AM with some difficulty getting back to sleep, which leaves her tired in the AM. Denies significant cough, rash, nightsweats or adenopathy to suggest active sarcoid.  11/24/2018- 73 year old female never smoker followed for OSA, Sarcoid, complicated by HBP, DM, Cancer R breast XRT, CPAP 7/[facility]     Autoset machine >> today change to auto 5-15 -----OSA on CPAP 7 // DME: [facility]; pt states she may need her pressure settings changed, breathing at baseline Download 100% compliance, AHI 3.8/ hr Body weight today 146 lbs Had lumpectomy/ XRT for R breast CA. Breathing ok. CPAP - occ wakes with dry mouth, eyes watering. Discussed humidifier. Not using Trazodone now while on tramadol.  Review of Systems-see HPI  + = positive Constitutional:   No-   weight loss, night sweats, fevers, chills, +fatigue, lassitude. HEENT:   No-  headaches, difficulty swallowing, tooth/dental problems, sore throat,       No-  sneezing, itching, ear ache, nasal congestion, post nasal drip,  CV:  No-   chest pain, orthopnea, PND, swelling in lower extremities, anasarca, dizziness, palpitations Resp: +shortness of breath with exertion or at rest.              No-   productive cough,  No non-productive cough,   No- coughing up of blood.              No-   change in color of mucus.  No- wheezing.   Skin: No-   rash or lesions. GI:  No-   heartburn, indigestion, abdominal pain, nausea, vomiting,  GU:  MS:  No-   joint pain or swelling.   Neuro-     nothing unusual Psych:  No- change in mood or affect. No depression or anxiety.  No memory loss.  Objective:   Physical Exam   Stable baseline exam General- Alert, Oriented, Affect-appropriate, Distress- none acute; medium build Skin- rash-none, lesions- none, excoriation- none Lymphadenopathy- none Head- atraumatic            Eyes- Gross vision intact, PERRLA, conjunctivae clear secretions            Ears- Hearing, canals-normal            Nose- Clear, no-Septal dev, mucus, polyps, erosion, perforation             Throat- Mallampati III , mucosa- not very dry , drainage- none, tonsils- atrophic Neck- flexible , trachea midline, no stridor , thyroid nl, carotid no bruit Chest - symmetrical excursion , unlabored           Heart/CV- RRR ,  Murmur+Trace systolic AS , no gallop  , no rub, nl s1 s2                           - JVD- none , edema- none, stasis changes- none, varices- none  Lung- clear to P&A, wheeze- none, cough- none , dullness-none, rub- none           Chest wall-  Abd-  Br/ Gen/ Rectal- Not done, not indicated Extrem- cyanosis- none, clubbing, none, atrophy- none, strength- nl Neuro- grossly intact to observation

## 2018-11-25 ENCOUNTER — Other Ambulatory Visit: Payer: Self-pay

## 2018-11-25 ENCOUNTER — Ambulatory Visit
Admission: RE | Admit: 2018-11-25 | Discharge: 2018-11-25 | Disposition: A | Discharge status: Home / Self Care | Payer: Medicare Other | Source: Ambulatory Visit | Attending: Radiation Oncology | Admitting: Radiation Oncology

## 2018-11-25 DIAGNOSIS — C50411 Malignant neoplasm of upper-outer quadrant of right female breast: Secondary | ICD-10-CM | POA: Diagnosis not present

## 2018-11-26 ENCOUNTER — Other Ambulatory Visit: Payer: Self-pay

## 2018-11-26 ENCOUNTER — Ambulatory Visit
Admission: RE | Admit: 2018-11-26 | Discharge: 2018-11-26 | Disposition: A | Discharge status: Home / Self Care | Payer: Medicare Other | Source: Ambulatory Visit | Attending: Radiation Oncology | Admitting: Radiation Oncology

## 2018-11-26 DIAGNOSIS — C50411 Malignant neoplasm of upper-outer quadrant of right female breast: Secondary | ICD-10-CM | POA: Diagnosis not present

## 2018-11-27 ENCOUNTER — Ambulatory Visit
Admission: RE | Admit: 2018-11-27 | Discharge: 2018-11-27 | Disposition: A | Discharge status: Home / Self Care | Payer: Medicare Other | Source: Ambulatory Visit | Attending: Radiation Oncology | Admitting: Radiation Oncology

## 2018-11-27 ENCOUNTER — Other Ambulatory Visit: Payer: Self-pay

## 2018-11-27 DIAGNOSIS — C50411 Malignant neoplasm of upper-outer quadrant of right female breast: Secondary | ICD-10-CM | POA: Diagnosis not present

## 2018-11-28 ENCOUNTER — Ambulatory Visit
Admission: RE | Admit: 2018-11-28 | Discharge: 2018-11-28 | Disposition: A | Discharge status: Home / Self Care | Payer: Medicare Other | Source: Ambulatory Visit | Attending: Radiation Oncology | Admitting: Radiation Oncology

## 2018-11-28 ENCOUNTER — Other Ambulatory Visit: Payer: Self-pay

## 2018-11-28 DIAGNOSIS — C50411 Malignant neoplasm of upper-outer quadrant of right female breast: Secondary | ICD-10-CM | POA: Diagnosis not present

## 2018-12-01 ENCOUNTER — Other Ambulatory Visit: Payer: Self-pay

## 2018-12-01 ENCOUNTER — Ambulatory Visit
Admission: RE | Admit: 2018-12-01 | Discharge: 2018-12-01 | Disposition: A | Discharge status: Home / Self Care | Payer: Medicare Other | Source: Ambulatory Visit | Attending: Radiation Oncology | Admitting: Radiation Oncology

## 2018-12-01 DIAGNOSIS — C50411 Malignant neoplasm of upper-outer quadrant of right female breast: Secondary | ICD-10-CM | POA: Diagnosis not present

## 2018-12-02 ENCOUNTER — Ambulatory Visit
Admission: RE | Admit: 2018-12-02 | Discharge: 2018-12-02 | Disposition: A | Discharge status: Home / Self Care | Payer: Medicare Other | Source: Ambulatory Visit | Attending: Radiation Oncology | Admitting: Radiation Oncology

## 2018-12-02 ENCOUNTER — Other Ambulatory Visit: Payer: Self-pay

## 2018-12-02 DIAGNOSIS — C50411 Malignant neoplasm of upper-outer quadrant of right female breast: Secondary | ICD-10-CM | POA: Diagnosis not present

## 2018-12-03 ENCOUNTER — Ambulatory Visit
Admission: RE | Admit: 2018-12-03 | Discharge: 2018-12-03 | Disposition: A | Discharge status: Home / Self Care | Payer: Medicare Other | Source: Ambulatory Visit | Attending: Radiation Oncology | Admitting: Radiation Oncology

## 2018-12-03 ENCOUNTER — Other Ambulatory Visit: Payer: Self-pay

## 2018-12-03 DIAGNOSIS — C50411 Malignant neoplasm of upper-outer quadrant of right female breast: Secondary | ICD-10-CM | POA: Diagnosis not present

## 2018-12-03 DIAGNOSIS — Z170 Estrogen receptor positive status [ER+]: Secondary | ICD-10-CM | POA: Insufficient documentation

## 2018-12-04 ENCOUNTER — Ambulatory Visit
Admission: RE | Admit: 2018-12-04 | Discharge: 2018-12-04 | Disposition: A | Discharge status: Home / Self Care | Payer: Medicare Other | Source: Ambulatory Visit | Attending: Radiation Oncology | Admitting: Radiation Oncology

## 2018-12-04 ENCOUNTER — Other Ambulatory Visit: Payer: Self-pay

## 2018-12-04 DIAGNOSIS — C50411 Malignant neoplasm of upper-outer quadrant of right female breast: Secondary | ICD-10-CM | POA: Diagnosis not present

## 2018-12-08 ENCOUNTER — Ambulatory Visit
Admission: RE | Admit: 2018-12-08 | Discharge: 2018-12-08 | Disposition: A | Discharge status: Home / Self Care | Payer: Medicare Other | Source: Ambulatory Visit | Attending: Radiation Oncology | Admitting: Radiation Oncology

## 2018-12-08 ENCOUNTER — Other Ambulatory Visit: Payer: Self-pay

## 2018-12-08 DIAGNOSIS — C50411 Malignant neoplasm of upper-outer quadrant of right female breast: Secondary | ICD-10-CM | POA: Diagnosis not present

## 2018-12-09 ENCOUNTER — Ambulatory Visit
Admission: RE | Admit: 2018-12-09 | Discharge: 2018-12-09 | Disposition: A | Discharge status: Home / Self Care | Payer: Medicare Other | Source: Ambulatory Visit | Attending: Radiation Oncology | Admitting: Radiation Oncology

## 2018-12-09 ENCOUNTER — Other Ambulatory Visit: Payer: Self-pay

## 2018-12-09 DIAGNOSIS — C50411 Malignant neoplasm of upper-outer quadrant of right female breast: Secondary | ICD-10-CM | POA: Diagnosis not present

## 2018-12-10 ENCOUNTER — Ambulatory Visit
Admission: RE | Admit: 2018-12-10 | Discharge: 2018-12-10 | Disposition: A | Discharge status: Home / Self Care | Payer: Medicare Other | Source: Ambulatory Visit | Attending: Radiation Oncology | Admitting: Radiation Oncology

## 2018-12-10 ENCOUNTER — Other Ambulatory Visit: Payer: Self-pay

## 2018-12-10 DIAGNOSIS — C50411 Malignant neoplasm of upper-outer quadrant of right female breast: Secondary | ICD-10-CM | POA: Diagnosis not present

## 2018-12-11 ENCOUNTER — Other Ambulatory Visit: Payer: Self-pay

## 2018-12-11 ENCOUNTER — Ambulatory Visit: Payer: Medicare Other | Admitting: Internal Medicine

## 2018-12-11 ENCOUNTER — Ambulatory Visit
Admission: RE | Admit: 2018-12-11 | Discharge: 2018-12-11 | Disposition: A | Discharge status: Home / Self Care | Payer: Medicare Other | Source: Ambulatory Visit | Attending: Radiation Oncology | Admitting: Radiation Oncology

## 2018-12-11 ENCOUNTER — Ambulatory Visit: Payer: Medicare Other

## 2018-12-11 ENCOUNTER — Encounter: Payer: Self-pay | Admitting: Internal Medicine

## 2018-12-11 VITALS — BP 122/60 | HR 61 | Temp 98.1°F | Ht 59.0 in | Wt 145.6 lb

## 2018-12-11 DIAGNOSIS — R8290 Unspecified abnormal findings in urine: Secondary | ICD-10-CM

## 2018-12-11 DIAGNOSIS — Z712 Person consulting for explanation of examination or test findings: Secondary | ICD-10-CM

## 2018-12-11 DIAGNOSIS — Z1211 Encounter for screening for malignant neoplasm of colon: Secondary | ICD-10-CM

## 2018-12-11 DIAGNOSIS — C50411 Malignant neoplasm of upper-outer quadrant of right female breast: Secondary | ICD-10-CM | POA: Diagnosis not present

## 2018-12-11 DIAGNOSIS — E1122 Type 2 diabetes mellitus with diabetic chronic kidney disease: Secondary | ICD-10-CM | POA: Diagnosis not present

## 2018-12-11 DIAGNOSIS — I129 Hypertensive chronic kidney disease with stage 1 through stage 4 chronic kidney disease, or unspecified chronic kidney disease: Secondary | ICD-10-CM

## 2018-12-11 DIAGNOSIS — Z0000 Encounter for general adult medical examination without abnormal findings: Secondary | ICD-10-CM

## 2018-12-11 DIAGNOSIS — E663 Overweight: Secondary | ICD-10-CM

## 2018-12-11 DIAGNOSIS — N182 Chronic kidney disease, stage 2 (mild): Secondary | ICD-10-CM | POA: Diagnosis not present

## 2018-12-11 DIAGNOSIS — Z170 Estrogen receptor positive status [ER+]: Secondary | ICD-10-CM

## 2018-12-11 DIAGNOSIS — E7800 Pure hypercholesterolemia, unspecified: Secondary | ICD-10-CM

## 2018-12-11 DIAGNOSIS — Z23 Encounter for immunization: Secondary | ICD-10-CM

## 2018-12-11 LAB — POCT URINALYSIS DIPSTICK
Bilirubin, UA: NEGATIVE
Blood, UA: NEGATIVE
Glucose, UA: NEGATIVE
Ketones, UA: NEGATIVE
Nitrite, UA: NEGATIVE
Protein, UA: NEGATIVE
Spec Grav, UA: 1.02 (ref 1.010–1.025)
Urobilinogen, UA: 0.2 E.U./dL
pH, UA: 6.5 (ref 5.0–8.0)

## 2018-12-11 LAB — POCT UA - MICROALBUMIN
Albumin/Creatinine Ratio, Urine, POC: <30
Creatinine, POC: 300 mg/dL
Microalbumin Ur, POC: 30 mg/L

## 2018-12-11 MED ORDER — TETANUS-DIPHTH-ACELL PERTUSSIS 5-2.5-18.5 LF-MCG/0.5 IM SUSP: 0.5000 mL | Freq: Once | INTRAMUSCULAR | Status: DC

## 2018-12-11 NOTE — Patient Instructions (Signed)
Health Maintenance, Female Adopting a healthy lifestyle and getting preventive care are important in promoting health and wellness. Ask your health care provider about:  The right schedule for you to have regular tests and exams.  Things you can do on your own to prevent diseases and keep yourself healthy. What should I know about diet, weight, and exercise? Eat a healthy diet   Eat a diet that includes plenty of vegetables, fruits, low-fat dairy products, and lean protein.  Do not eat a lot of foods that are high in solid fats, added sugars, or sodium. Maintain a healthy weight Body mass index (BMI) is used to identify weight problems. It estimates body fat based on height and weight. Your health care provider can help determine your BMI and help you achieve or maintain a healthy weight. Get regular exercise Get regular exercise. This is one of the most important things you can do for your health. Most adults should:  Exercise for at least 150 minutes each week. The exercise should increase your heart rate and make you sweat (moderate-intensity exercise).  Do strengthening exercises at least twice a week. This is in addition to the moderate-intensity exercise.  Spend less time sitting. Even light physical activity can be beneficial. Watch cholesterol and blood lipids Have your blood tested for lipids and cholesterol at 74 years of age, then have this test every 5 years. Have your cholesterol levels checked more often if:  Your lipid or cholesterol levels are high.  You are older than 74 years of age.  You are at high risk for heart disease. What should I know about cancer screening? Depending on your health history and family history, you may need to have cancer screening at various ages. This may include screening for:  Breast cancer.  Cervical cancer.  Colorectal cancer.  Skin cancer.  Lung cancer. What should I know about heart disease, diabetes, and high blood  pressure? Blood pressure and heart disease  High blood pressure causes heart disease and increases the risk of stroke. This is more likely to develop in people who have high blood pressure readings, are of African descent, or are overweight.  Have your blood pressure checked: ? Every 3-5 years if you are 18-39 years of age. ? Every year if you are 40 years old or older. Diabetes Have regular diabetes screenings. This checks your fasting blood sugar level. Have the screening done:  Once every three years after age 40 if you are at a normal weight and have a low risk for diabetes.  More often and at a younger age if you are overweight or have a high risk for diabetes. What should I know about preventing infection? Hepatitis B If you have a higher risk for hepatitis B, you should be screened for this virus. Talk with your health care provider to find out if you are at risk for hepatitis B infection. Hepatitis C Testing is recommended for:  Everyone born from 1945 through 1965.  Anyone with known risk factors for hepatitis C. Sexually transmitted infections (STIs)  Get screened for STIs, including gonorrhea and chlamydia, if: ? You are sexually active and are younger than 74 years of age. ? You are older than 74 years of age and your health care provider tells you that you are at risk for this type of infection. ? Your sexual activity has changed since you were last screened, and you are at increased risk for chlamydia or gonorrhea. Ask your health care provider if   you are at risk.  Ask your health care provider about whether you are at high risk for HIV. Your health care provider may recommend a prescription medicine to help prevent HIV infection. If you choose to take medicine to prevent HIV, you should first get tested for HIV. You should then be tested every 3 months for as long as you are taking the medicine. Pregnancy  If you are about to stop having your period (premenopausal) and  you may become pregnant, seek counseling before you get pregnant.  Take 400 to 800 micrograms (mcg) of folic acid every day if you become pregnant.  Ask for birth control (contraception) if you want to prevent pregnancy. Osteoporosis and menopause Osteoporosis is a disease in which the bones lose minerals and strength with aging. This can result in bone fractures. If you are 65 years old or older, or if you are at risk for osteoporosis and fractures, ask your health care provider if you should:  Be screened for bone loss.  Take a calcium or vitamin D supplement to lower your risk of fractures.  Be given hormone replacement therapy (HRT) to treat symptoms of menopause. Follow these instructions at home: Lifestyle  Do not use any products that contain nicotine or tobacco, such as cigarettes, e-cigarettes, and chewing tobacco. If you need help quitting, ask your health care provider.  Do not use street drugs.  Do not share needles.  Ask your health care provider for help if you need support or information about quitting drugs. Alcohol use  Do not drink alcohol if: ? Your health care provider tells you not to drink. ? You are pregnant, may be pregnant, or are planning to become pregnant.  If you drink alcohol: ? Limit how much you use to 0-1 drink a day. ? Limit intake if you are breastfeeding.  Be aware of how much alcohol is in your drink. In the [place] one drink equals one 12 oz bottle of beer (355 mL), one 5 oz glass of wine (148 mL), or one 1 oz glass of hard liquor (44 mL). General instructions  Schedule regular health, dental, and eye exams.  Stay current with your vaccines.  Tell your health care provider if: ? You often feel depressed. ? You have ever been abused or do not feel safe at home. Summary  Adopting a healthy lifestyle and getting preventive care are important in promoting health and wellness.  Follow your health care provider's instructions about healthy  diet, exercising, and getting tested or screened for diseases.  Follow your health care provider's instructions on monitoring your cholesterol and blood pressure. This information is not intended to replace advice given to you by your health care provider. Make sure you discuss any questions you have with your health care provider. Document Released: 12/04/2010 Document Revised: 05/14/2018 Document Reviewed: 05/14/2018 Elsevier Patient Education  2020 [employer]  

## 2018-12-12 ENCOUNTER — Other Ambulatory Visit: Payer: Self-pay

## 2018-12-12 ENCOUNTER — Ambulatory Visit
Admission: RE | Admit: 2018-12-12 | Discharge: 2018-12-12 | Disposition: A | Discharge status: Home / Self Care | Payer: Medicare Other | Source: Ambulatory Visit | Attending: Radiation Oncology | Admitting: Radiation Oncology

## 2018-12-12 DIAGNOSIS — C50411 Malignant neoplasm of upper-outer quadrant of right female breast: Secondary | ICD-10-CM | POA: Diagnosis not present

## 2018-12-12 LAB — HEMOGLOBIN A1C
Est. average glucose Bld gHb Est-mCnc: 120 mg/dL
Hgb A1c MFr Bld: 5.8 % — ABNORMAL HIGH (ref 4.8–5.6)

## 2018-12-12 LAB — LIPID PANEL
Chol/HDL Ratio: 2.9 ratio (ref 0.0–4.4)
Cholesterol, Total: 188 mg/dL (ref 100–199)
HDL: 64 mg/dL (ref >39)
LDL Calculated: 112 mg/dL — ABNORMAL HIGH (ref 0–99)
Triglycerides: 61 mg/dL (ref 0–149)
VLDL Cholesterol Cal: 12 mg/dL (ref 5–40)

## 2018-12-12 LAB — URINE CULTURE

## 2018-12-13 MED ORDER — TETANUS-DIPHTH-ACELL PERTUSSIS 5-2.5-18.5 LF-MCG/0.5 IM SUSP: 0.5000 mL | Freq: Once | INTRAMUSCULAR | 0 refills | Status: AC

## 2018-12-13 NOTE — Progress Notes (Signed)
Subjective:     Patient ID: Mariah Burns , female    DOB: 28-Feb-1946 , 73 y.o.   MRN: [id number]   Chief Complaint  Patient presents with  . Annual Exam  . Immunizations    tdap / pneumonia  . Diabetes  . Hypertension    HPI  She is here today for a full physical examination. Since her last visit, she has been diagnosed with stage 1A breast cancer in the right breast. She denies known family history of breast cancer. She is currently undergoing radiation therapy. She did not tolerate anostrazole therapy. She is in good spirits; although, she is concerned about [place] state of events. She is aware that she may need to update some of her immunizations.   Diabetes She presents for her follow-up diabetic visit. She has type 2 diabetes mellitus. Her disease course has been stable. There are no hypoglycemic associated symptoms. Pertinent negatives for diabetes include no blurred vision. There are no hypoglycemic complications. Diabetic complications include nephropathy. Risk factors for coronary artery disease include diabetes mellitus, dyslipidemia, hypertension, sedentary lifestyle and post-menopausal. She is compliant with treatment most of the time. She is following a diabetic diet. She participates in exercise intermittently. Her home blood glucose trend is fluctuating minimally. Her breakfast blood glucose is taken between 8-9 am. Her breakfast blood glucose range is generally 90-110 mg/dl. Eye exam is not current.  Hypertension This is a chronic problem. The current episode started more than 1 year ago. The problem has been gradually improving since onset. The problem is controlled. Pertinent negatives include no blurred vision. The current treatment provides moderate improvement. Hypertensive end-organ damage includes kidney disease.     Past Medical History:  Diagnosis Date  . Anxiety   . Cancer ([place]) 08/2018   right breast IDC  . Hyperlipidemia   . Hypertension   . OSA  (obstructive sleep apnea)    NPSG 03/08/2010  AHI 19.8/hr, CPAP nightly  . Osteopenia   . Pre-diabetes   . 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 Allergies Sister   . Heart disease Sister   . Lupus Sister   . Allergies Mother   . Allergies Daughter   . Healthy Daughter   . Heart disease Sister   . Gout Brother   . Hypertension Brother   . Heart disease Brother   . Heart failure Father   . Gout Brother   . Hypertension Brother   . Hypertension Brother   . Hypertension Brother   . Post-traumatic stress disorder Brother   . Healthy Daughter      Current Outpatient Medications:  .  Cholecalciferol (VITAMIN D-3 PO), Take 2,000 Units by mouth daily., Disp: , Rfl:  .  felodipine (PLENDIL) 10 MG 24 hr tablet, TAKE 1 TABLET BY MOUTH EVERY DAY, Disp: 90 tablet, Rfl: 2 .  fish oil-omega-3 fatty acids 1000 MG capsule, Take 2 capsules by mouth daily.  , Disp: , Rfl:  .  GLUCOSAMINE-CHONDROITIN PO, Take 2 tablets by mouth daily.  , Disp: , Rfl:  .  L-Methylfolate-Algae-B12-B6 (METANX) 3-90.314-2-35 MG CAPS, Take 1 capsule by mouth 2 (two) times daily. , Disp: , Rfl:  .  Menthol, Topical Analgesic, (BIOFREEZE ROLL-ON EX), Apply 1 application topically as needed (pain in back,neck,thumb)., Disp: , Rfl:  .  Misc  Natural Products (TART CHERRY ADVANCED PO), Take 2 tablets by mouth daily., Disp: , Rfl:  .  Polyvinyl Alcohol-Povidone PF (REFRESH) 1.4-0.6 % SOLN, Place 1 drop into both eyes daily as needed (dry eyes). , Disp: , Rfl:  .  pravastatin (PRAVACHOL) 40 MG tablet, TAKE 1 CAPSULE BY MOUTH ON MONDAY, WEDNESDAY, FRIDAY, Disp: 30 tablet, Rfl: 1 .  traMADol (ULTRAM) 50 MG tablet, Take 1 tablet (50 mg total) by mouth every 6 (six) hours as needed. (Patient taking differently: Take 25 mg by mouth every 6 (six) hours as needed. ), Disp: 15 tablet,  Rfl: 1 .  aspirin 81 MG chewable tablet, Chew by mouth daily. Not taking daily, Disp: , Rfl:  .  [place] Oil-Levomenthol (FDGARD PO), Take 2 tablets by mouth as needed (upset stomach)., Disp: , Rfl:  .  clotrimazole (LOTRIMIN) 1 % cream, Apply 1 application topically as needed (itching)., Disp: , Rfl:  .  co-enzyme Q-10 30 MG capsule, Take 30 mg by mouth daily., Disp: , Rfl:  .  nitroGLYCERIN (NITROSTAT) 0.4 MG SL tablet, Place 1 tablet (0.4 mg total) under the tongue every 5 (five) minutes as needed for chest pain. (Patient not taking: Reported on 12/11/2018), Disp: 25 tablet, Rfl: 2 .  Tdap (BOOSTRIX) 5-2.5-18.5 LF-MCG/0.5 injection, Inject 0.5 mLs into the muscle once for 1 dose., Disp: 0.5 mL, Rfl: 0 .  traZODone (DESYREL) 50 MG tablet, Take 50 mg by mouth at bedtime., Disp: , Rfl:    Allergies  Allergen Reactions  . Fosamax [Alendronate Sodium] Other (See Comments)    REACTION: "CAUSE GI UPSET AND FATIGUE"  . Omeprazole Other (See Comments)    Lower ab pain  . Motrin [Ibuprofen] Other (See Comments)    GI upset- has to eat prior to taking  . Fluvastatin Other (See Comments)    mild memoryproblems of confusion       The patient states she uses none for birth control. Last LMP was No LMP recorded. Patient has had a hysterectomy.. Negative for Dysmenorrhea Negative for: breast discharge, breast lump(s), breast pain and breast self exam. Associated symptoms include abnormal vaginal bleeding. Pertinent negatives include abnormal bleeding (hematology), anxiety, decreased libido, depression, difficulty falling sleep, dyspareunia, history of infertility, nocturia, sexual dysfunction, sleep disturbances, urinary incontinence, urinary urgency, vaginal discharge and vaginal itching. Diet regular.The patient states her exercise level is  regular - she is active in her gardening, walks on occasion.   . The patient's tobacco use is:  Social History   Tobacco Use  Smoking Status Never Smoker   Smokeless Tobacco Never Used  . She has been exposed to passive smoke. The patient's alcohol use is:  Social History   Substance and Sexual Activity  Alcohol Use No    Review of Systems  Constitutional: Negative.   HENT: Negative.   Eyes: Negative.  Negative for blurred vision.  Respiratory: Negative.   Cardiovascular: Negative.   Gastrointestinal: Negative.   Endocrine: Negative.   Genitourinary: Negative.   Musculoskeletal: Negative.   Skin: Negative.   Allergic/Immunologic: Negative.   Neurological: Negative.   Hematological: Negative.   Psychiatric/Behavioral: Negative.      Today's Vitals   12/11/18 1054  BP: 122/60  Pulse: 61  Temp: 98.1 F (36.7 C)  TempSrc: Oral  Weight: 145 lb 9.6 oz (66 kg)  Height: 4\' 11"  (1.499 m)   Body mass index is 29.41 kg/m.   Objective:  Physical Exam Vitals signs and nursing note reviewed.  Constitutional:  Appearance: Normal appearance.  HENT:     Head: Normocephalic and atraumatic.     Right Ear: Tympanic membrane, ear canal and external ear normal.     Left Ear: Tympanic membrane, ear canal and external ear normal.     Nose: Nose normal.     Mouth/Throat:     Mouth: Mucous membranes are moist.     Pharynx: Oropharynx is clear.  Eyes:     Extraocular Movements: Extraocular movements intact.     Conjunctiva/sclera: Conjunctivae normal.     Pupils: Pupils are equal, round, and reactive to light.  Neck:     Musculoskeletal: Normal range of motion and neck supple.  Cardiovascular:     Rate and Rhythm: Normal rate and regular rhythm.     Pulses: Normal pulses.          Dorsalis pedis pulses are 2+ on the right side and 2+ on the left side.     Heart sounds: Normal heart sounds.  Pulmonary:     Effort: Pulmonary effort is normal.     Breath sounds: Normal breath sounds.  Chest:     Breasts:        Right: Skin change present.        Left: Normal.     Comments: r breast slightly tender to palpation There is soft  tissue firmness in right lateral breast  Hyperpigmentation r breast Abdominal:     General: Abdomen is flat. Bowel sounds are normal.     Palpations: Abdomen is soft.  Genitourinary:    Comments: deferred Musculoskeletal: Normal range of motion.  Feet:     Right foot:     Protective Sensation: 5 sites tested. 5 sites sensed.     Skin integrity: Skin integrity normal.     Toenail Condition: Right toenails are normal.     Left foot:     Protective Sensation: 5 sites tested. 5 sites sensed.     Skin integrity: Skin integrity normal.     Toenail Condition: Left toenails are normal.  Lymphadenopathy:     Upper Body:     Right upper body: No supraclavicular or axillary adenopathy.     Left upper body: No supraclavicular or axillary adenopathy.  Skin:    General: Skin is warm and dry.  Neurological:     General: No focal deficit present.     Mental Status: She is alert and oriented to person, place, and time.  Psychiatric:        Mood and Affect: Mood normal.        Behavior: Behavior normal.         Assessment And Plan:     1. Routine general medical examination at health care facility  A full exam was performed. She is encouraged to continue with monthly SBE. PATIENT HAS BEEN ADVISED TO GET 30-45 MINUTES REGULAR EXERCISE NO LESS THAN FOUR TO FIVE DAYS PER WEEK - BOTH WEIGHTBEARING EXERCISES AND AEROBIC ARE RECOMMENDED.  SHE IS ADVISED TO FOLLOW A HEALTHY DIET WITH AT LEAST SIX FRUITS/VEGGIES PER DAY, DECREASE INTAKE OF RED MEAT, AND TO INCREASE FISH INTAKE TO TWO DAYS PER WEEK.  MEATS/FISH SHOULD NOT BE FRIED, BAKED OR BROILED IS PREFERABLE.  I SUGGEST WEARING SPF 50 SUNSCREEN ON EXPOSED PARTS AND ESPECIALLY WHEN IN THE DIRECT SUNLIGHT FOR AN EXTENDED PERIOD OF TIME.  PLEASE AVOID FAST FOOD RESTAURANTS AND INCREASE YOUR WATER INTAKE.  - POCT Urinalysis Dipstick (81002) - POCT UA - Microalbumin  2. Type 2  diabetes mellitus with stage 2 chronic kidney disease, without long-term  current use of insulin ([place])  Diabetic foot exam was performed.  I DISCUSSED WITH THE PATIENT AT LENGTH REGARDING THE GOALS OF GLYCEMIC CONTROL AND POSSIBLE LONG-TERM COMPLICATIONS.  I  ALSO STRESSED THE IMPORTANCE OF COMPLIANCE WITH HOME GLUCOSE MONITORING, DIETARY RESTRICTIONS INCLUDING AVOIDANCE OF SUGARY DRINKS/PROCESSED FOODS,  ALONG WITH REGULAR EXERCISE.  I  ALSO STRESSED THE IMPORTANCE OF ANNUAL EYE EXAMS, SELF FOOT CARE AND COMPLIANCE WITH OFFICE VISITS.  - Lipid panel - Hemoglobin A1c - POCT Urinalysis Dipstick (81002) - POCT UA - Microalbumin - Ambulatory referral to Ophthalmology  3. Parenchymal renal hypertension, stage 1 through stage 4 or unspecified chronic kidney disease  Well controlled. She will continue with current meds. She is encouraged to avoid adding salt to her foods.  EKG performed, no new changes noted. RBBB is present.   - EKG 12-Lead  4. Pure hypercholesterolemia  Chronic. Importance of medication and dietary compliance was discussed with the patient.   5. Malignant neoplasm of upper-outer quadrant of right breast in female, estrogen receptor positive [facility])  Recent oncology notes reviewed. She is currently undergoing radiation therapy.   6. Abnormal urine  I will send off for culture.   - Culture, Urine  7. Need for vaccination  Rx for Tdap(Boostrix) was sent to the pharmacy. She would like to update pneumovax at a later date.   8. Screen for colon cancer  I will refer her for repeat colonoscopy. Last one was in 2010. She is in agreement with referral.   9. Overweight (BMI 25.0-29.9)  Importance of achieving optimal weight to decrease risk of cardiovascular disease and cancers was discussed with the patient in full detail. She is encouraged to start slowly - start with 10 minutes twice daily at least three to four days per week and to gradually build to 30 minutes five days weekly. She was given tips to incorporate more activity into her daily  routine - take stairs when possible, park farther away from grocery stores, etc.    [name], MD    THE PATIENT IS ENCOURAGED TO PRACTICE SOCIAL DISTANCING DUE TO THE COVID-19 PANDEMIC.

## 2018-12-15 ENCOUNTER — Other Ambulatory Visit: Payer: Self-pay

## 2018-12-15 ENCOUNTER — Ambulatory Visit
Admission: RE | Admit: 2018-12-15 | Discharge: 2018-12-15 | Disposition: A | Discharge status: Home / Self Care | Payer: Medicare Other | Source: Ambulatory Visit | Attending: Radiation Oncology | Admitting: Radiation Oncology

## 2018-12-15 DIAGNOSIS — C50411 Malignant neoplasm of upper-outer quadrant of right female breast: Secondary | ICD-10-CM | POA: Diagnosis not present

## 2018-12-16 ENCOUNTER — Ambulatory Visit
Admission: RE | Admit: 2018-12-16 | Discharge: 2018-12-16 | Disposition: A | Discharge status: Home / Self Care | Payer: Medicare Other | Source: Ambulatory Visit | Attending: Radiation Oncology | Admitting: Radiation Oncology

## 2018-12-16 ENCOUNTER — Other Ambulatory Visit: Payer: Self-pay

## 2018-12-16 DIAGNOSIS — C50411 Malignant neoplasm of upper-outer quadrant of right female breast: Secondary | ICD-10-CM | POA: Diagnosis not present

## 2018-12-17 ENCOUNTER — Ambulatory Visit
Admission: RE | Admit: 2018-12-17 | Discharge: 2018-12-17 | Disposition: A | Discharge status: Home / Self Care | Payer: Medicare Other | Source: Ambulatory Visit | Attending: Radiation Oncology | Admitting: Radiation Oncology

## 2018-12-17 ENCOUNTER — Other Ambulatory Visit: Payer: Self-pay

## 2018-12-17 DIAGNOSIS — C50411 Malignant neoplasm of upper-outer quadrant of right female breast: Secondary | ICD-10-CM | POA: Diagnosis not present

## 2018-12-18 ENCOUNTER — Telehealth: Payer: Self-pay | Admitting: Hematology

## 2018-12-18 ENCOUNTER — Encounter: Payer: Self-pay | Admitting: Internal Medicine

## 2018-12-18 ENCOUNTER — Ambulatory Visit
Admission: RE | Admit: 2018-12-18 | Discharge: 2018-12-18 | Disposition: A | Discharge status: Home / Self Care | Payer: Medicare Other | Source: Ambulatory Visit | Attending: Radiation Oncology | Admitting: Radiation Oncology

## 2018-12-18 ENCOUNTER — Other Ambulatory Visit: Payer: Self-pay

## 2018-12-18 ENCOUNTER — Inpatient Hospital Stay: Payer: Medicare Other | Admitting: Hematology

## 2018-12-18 DIAGNOSIS — C50411 Malignant neoplasm of upper-outer quadrant of right female breast: Secondary | ICD-10-CM | POA: Diagnosis not present

## 2018-12-18 NOTE — Telephone Encounter (Signed)
R/s appt per 7/16 sch message- pt aware of appt date and time   

## 2018-12-19 ENCOUNTER — Other Ambulatory Visit: Payer: Self-pay

## 2018-12-19 ENCOUNTER — Ambulatory Visit
Admission: RE | Admit: 2018-12-19 | Discharge: 2018-12-19 | Disposition: A | Discharge status: Home / Self Care | Payer: Medicare Other | Source: Ambulatory Visit | Attending: Radiation Oncology | Admitting: Radiation Oncology

## 2018-12-19 ENCOUNTER — Encounter: Payer: Self-pay | Admitting: Internal Medicine

## 2018-12-19 DIAGNOSIS — C50411 Malignant neoplasm of upper-outer quadrant of right female breast: Secondary | ICD-10-CM | POA: Diagnosis not present

## 2018-12-22 ENCOUNTER — Ambulatory Visit
Admission: RE | Admit: 2018-12-22 | Discharge: 2018-12-22 | Disposition: A | Discharge status: Home / Self Care | Payer: Medicare Other | Source: Ambulatory Visit | Attending: Radiation Oncology | Admitting: Radiation Oncology

## 2018-12-22 ENCOUNTER — Other Ambulatory Visit: Payer: Self-pay

## 2018-12-22 DIAGNOSIS — C50411 Malignant neoplasm of upper-outer quadrant of right female breast: Secondary | ICD-10-CM | POA: Diagnosis not present

## 2018-12-23 ENCOUNTER — Ambulatory Visit
Admission: RE | Admit: 2018-12-23 | Discharge: 2018-12-23 | Disposition: A | Discharge status: Home / Self Care | Payer: Medicare Other | Source: Ambulatory Visit | Attending: Radiation Oncology | Admitting: Radiation Oncology

## 2018-12-23 ENCOUNTER — Other Ambulatory Visit: Payer: Self-pay

## 2018-12-23 DIAGNOSIS — C50411 Malignant neoplasm of upper-outer quadrant of right female breast: Secondary | ICD-10-CM | POA: Diagnosis not present

## 2018-12-24 ENCOUNTER — Ambulatory Visit
Admission: RE | Admit: 2018-12-24 | Discharge: 2018-12-24 | Disposition: A | Discharge status: Home / Self Care | Payer: Medicare Other | Source: Ambulatory Visit | Attending: Radiation Oncology | Admitting: Radiation Oncology

## 2018-12-24 ENCOUNTER — Other Ambulatory Visit: Payer: Self-pay

## 2018-12-24 DIAGNOSIS — C50411 Malignant neoplasm of upper-outer quadrant of right female breast: Secondary | ICD-10-CM | POA: Diagnosis not present

## 2018-12-25 ENCOUNTER — Other Ambulatory Visit: Payer: Self-pay

## 2018-12-25 ENCOUNTER — Ambulatory Visit
Admission: RE | Admit: 2018-12-25 | Discharge: 2018-12-25 | Disposition: A | Discharge status: Home / Self Care | Payer: Medicare Other | Source: Ambulatory Visit | Attending: Radiation Oncology | Admitting: Radiation Oncology

## 2018-12-25 DIAGNOSIS — C50411 Malignant neoplasm of upper-outer quadrant of right female breast: Secondary | ICD-10-CM | POA: Diagnosis not present

## 2018-12-26 ENCOUNTER — Ambulatory Visit
Admission: RE | Admit: 2018-12-26 | Discharge: 2018-12-26 | Disposition: A | Discharge status: Home / Self Care | Payer: Medicare Other | Source: Ambulatory Visit | Attending: Radiation Oncology | Admitting: Radiation Oncology

## 2018-12-26 ENCOUNTER — Other Ambulatory Visit: Payer: Self-pay | Admitting: Internal Medicine

## 2018-12-26 ENCOUNTER — Other Ambulatory Visit: Payer: Self-pay

## 2018-12-26 DIAGNOSIS — C50411 Malignant neoplasm of upper-outer quadrant of right female breast: Secondary | ICD-10-CM | POA: Diagnosis not present

## 2018-12-26 NOTE — Progress Notes (Signed)
[place]   [phone] [phone]   Clinic Follow up Note   Patient Care Team: Mariah Chard, MD as PCP - General (Internal Medicine) Mariah Crome, MD as PCP - Cardiology (Cardiology) Mariah Kaufmann, RN as Oncology Nurse Navigator Mariah Germany, RN as Oncology Nurse Navigator Mariah Seltzer, MD as Consulting Physician (General Surgery) Mariah Merle, MD as Consulting Physician (Hematology) Mariah Pray, MD as Consulting Physician (Radiation Oncology)  Date of Service:  12/30/2018  CHIEF COMPLAINT: F/u of right breast cancer   SUMMARY OF ONCOLOGIC HISTORY: Oncology History Overview Note  Cancer Staging Malignant neoplasm of upper-outer quadrant of right breast in female, estrogen receptor positive ([place]) Staging form: Breast, AJCC 8th Edition - Clinical stage from 08/04/2018: Stage IA (cT1c, cN0, cM0, G2, ER+, PR+, HER2-) - Signed by Mariah Merle, MD on 08/12/2018 - Pathologic stage from 09/02/2018: Stage IA (pT2, pN1a, cM0, G1, ER+, PR+, HER2-) - Signed by Mariah Merle, MD on 09/29/2018     Malignant neoplasm of upper-outer quadrant of right breast in female, estrogen receptor positive ([place])  07/29/2018 Mammogram   Diagnostic Mammgram 07/29/18  IMPRESSION:  The 1.7cm oval mass in the right breast upper outer qudrant middle third, 3-5 from nipple is suspicious of malignancy.    08/04/2018 Cancer Staging   Staging form: Breast, AJCC 8th Edition - Clinical stage from 08/04/2018: Stage IA (cT1c, cN0, cM0, G2, ER+, PR+, HER2-) - Signed by Mariah Merle, MD on 08/12/2018   08/04/2018 Initial Biopsy   Diagnosis 08/04/18  Breast, right, needle core biopsy, 1.7 cm irregular solid mass at 9 o'clock, 4 cm fn - INVASIVE DUCTAL CARCINOMA WITH EXTRACELLULAR MUCIN.   08/04/2018 Receptors her2   Results: IMMUNOHISTOCHEMICAL AND MORPHOMETRIC ANALYSIS PERFORMED MANUALLY The tumor cells are NEGATIVE for Her2 (0). Estrogen Receptor: 100%, POSITIVE, STRONG STAINING  INTENSITY Progesterone Receptor: 100%, POSITIVE, STRONG STAINING INTENSITY Proliferation Marker Ki67: 10%   08/11/2018 Initial Diagnosis   Malignant neoplasm of upper-outer quadrant of right breast in female, estrogen receptor positive ([place])   09/02/2018 Surgery   RIGHT BREAST LUMPECTOMY WITH RADIOACTIVE SEED AND  RIGHT SENTINEL LYMPH NODE BIOPSY by Dr. Excell Burns  09/02/18    09/02/2018 Pathology Results   Diagnosis 1. Breast, lumpectomy, Right w/seed - INVASIVE DUCTAL CARCINOMA WITH EXTRACELLULAR MUCIN, GRADE I, 2.1 CM. - DUCTAL CARCINOMA IN SITU, INTERMEDIATE NUCLEAR GRADE. - INVASIVE CARCINOMA IS 2 MM FROM SUPERIOR MARGIN AND 2.5 MM FROM LATERAL MARGIN. - DUCTAL CARCINOMA IN SITU IS LESS THAN 1 MM FROM ANTERIOR MARGIN, 4 MM FROM SUPERIOR AND LATERAL MARGINS, 5 MM FROM INFERIOR MARGIN AND 8 MM FROM THE POSTERIOR MARGIN. - SMALL INTRADUCTAL PAPILLOMA. - NO EVIDENCE OF LYMPHOVASCULAR OR PERINEURAL INVASION. - BIOPSY SITE CHANGES. - SEE ONCOLOGY TABLE. 2. Lymph node, sentinel, biopsy, Right Axillary - LYMPH NODE, NEGATIVE FOR CARCINOMA (0/1). 3. Lymph node, sentinel, biopsy, Right - LYMPH NODE, NEGATIVE FOR CARCINOMA (0/1). 4. Lymph node, sentinel, biopsy, Right - METASTATIC BREAST CARCINOMA TO ONE LYMPH NODE (1/1). - METASTATIC FOCUS MEASURES 0.4 CM IN GREATEST DIMENSION. - NO DEFINITE EVIDENCE OF EXTRANODAL EXTENSION.   09/02/2018 Miscellaneous   MammaPrint  09/02/18 Low risk with 10% average 10-year risk of recurrnce untreated  Mammaprint Index: +0.287 97.8% of patients treated with hormonal therapy have no distnat recurrance.    09/02/2018 Cancer Staging   Staging form: Breast, AJCC 8th Edition - Pathologic stage from 09/02/2018: Stage IA (pT2, pN1a, cM0, G1, ER+, PR+, HER2-) - Signed by Mariah Merle, MD on  09/29/2018   10/05/2018 -  Anti-estrogen oral therapy   Anastrozole 51m daily starting 10/05/18; stopped 5/18 due to insomnia, hot flashes, depression, and vaginal irritation. Plan to  Exemestane in 01/2019.    11/12/2018 - 12/30/2018 Radiation Therapy   RT 11/12/18-12/30/18 with Dr. KEarney Hamburg      CURRENT THERAPY:  Anastrozole 112mdaily starting 10/05/18, stopped 10/20/18 due to side effects. Plan to Exemestane in 01/2019.  RT with Dr. KiEarney Hamburg/10/20-7/28/20  INTERVAL HISTORY:  FaSELITA STAIGERs here for a follow up right breast cancer. She presents to the clinic alone. She called her daughter to be included in visit today. Patient notes with RT her skin color has changed. She also has more fatigue. She has shooting pain in her breast from surgery. She denies any skin peeling or breakdown.  She did not tolerate Anastrozole. She had significant hot flashes and vaginal discharge with odor. She stopped after 2 months.    REVIEW OF SYSTEMS:   Constitutional: Denies fevers, chills or abnormal weight loss (+) fatigue  Eyes: Denies blurriness of vision Ears, nose, mouth, throat, and face: Denies mucositis or sore throat Respiratory: Denies cough, dyspnea or wheezes Cardiovascular: Denies palpitation, chest discomfort or lower extremity swelling Gastrointestinal:  Denies nausea, heartburn or change in bowel habits Skin: Denies abnormal skin rashes Lymphatics: Denies new lymphadenopathy or easy bruising Neurological:Denies numbness, tingling or new weaknesses Behavioral/Psych: Mood is stable, no new changes  Breast: (+) right breast skin hyperpigmentation and shooting breast pain  All other systems were reviewed with the patient and are negative.  MEDICAL HISTORY:  Past Medical History:  Diagnosis Date  . Anxiety   . Cancer ([place]03/2020   right breast IDC  . Hyperlipidemia   . Hypertension   . OSA (obstructive sleep apnea)    NPSG 03/08/2010  AHI 19.8/hr, CPAP nightly  . Osteopenia   . Pre-diabetes   . 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 BREAST LUMPECTOMY WITH RADIOACTIVE SEED AND SENTINEL LYMPH NODE BIOPSY Right 09/02/2018   Procedure: RIGHT BREAST LUMPECTOMY WITH RADIOACTIVE SEED AND  RIGHT SENTINEL LYMPH NODE BIOPSY;  Surgeon: HoExcell SeltzerMD;  [facility] Service: General;  Laterality: Right;  . BRONCHOSCOPY     dx'd sarcoid  . KNEE ARTHROPLASTY Left 03/13/2017  . NEUROPLASTY / TRANSPOSITION ULNAR NERVE AT ELBOW  09/2011  . TOTAL ABDOMINAL HYSTERECTOMY      I have reviewed the social history and family history with the patient and they are unchanged from previous note.  ALLERGIES:  is allergic to fosamax [alendronate sodium]; omeprazole; motrin [ibuprofen]; and fluvastatin.  MEDICATIONS:  Current Outpatient Medications  Medication Sig Dispense Refill  . Caraway Oil-Levomenthol (FDGARD PO) Take 2 tablets by mouth as needed (upset stomach).    . Cholecalciferol (VITAMIN D-3 PO) Take 2,000 Units by mouth daily.    . clotrimazole (LOTRIMIN) 1 % cream Apply 1 application topically as needed (itching).    . Marland Kitcheno-enzyme Q-10 30 MG capsule Take 30 mg by mouth daily.    . felodipine (PLENDIL) 10 MG 24 hr tablet TAKE 1 TABLET BY MOUTH EVERY DAY 90 tablet 2  . fish oil-omega-3 fatty acids 1000 MG capsule Take 2 capsules by mouth daily.      . Marland KitchenLUCOSAMINE-CHONDROITIN PO Take 2 tablets  by mouth daily.      Marland Kitchen L-Methylfolate-Algae-B12-B6 (METANX) 3-90.314-2-35 MG CAPS Take 1 capsule by mouth 2 (two) times daily.     . Menthol, Topical Analgesic, (BIOFREEZE ROLL-ON EX) Apply 1 application topically as needed (pain in back,neck,thumb).    . Misc Natural Products (TART CHERRY ADVANCED PO) Take 2 tablets by mouth daily.    . Polyvinyl Alcohol-Povidone PF (REFRESH) 1.4-0.6 % SOLN Place 1 drop into both eyes daily as needed (dry eyes).     . pravastatin (PRAVACHOL) 40 MG tablet TAKE 1 CAPSULE BY MOUTH ON MONDAY, WEDNESDAY, FRIDAY 30 tablet 1  . aspirin 81 MG  chewable tablet Chew by mouth daily. Not taking daily    . exemestane (AROMASIN) 25 MG tablet Take 1 tablet (25 mg total) by mouth daily after breakfast. 30 tablet 3  . nitroGLYCERIN (NITROSTAT) 0.4 MG SL tablet Place 1 tablet (0.4 mg total) under the tongue every 5 (five) minutes as needed for chest pain. (Patient not taking: Reported on 12/11/2018) 25 tablet 2  . traZODone (DESYREL) 50 MG tablet Take 50 mg by mouth at bedtime.     No current facility-administered medications for this visit.     PHYSICAL EXAMINATION: ECOG PERFORMANCE STATUS: 1 - Symptomatic but completely ambulatory  Vitals:   12/30/18 1304  BP: (!) 158/66  Pulse: 66  Resp: 17  Temp: 98.5 F (36.9 C)  SpO2: 100%   Filed Weights   12/30/18 1304  Weight: 146 lb 6.4 oz (66.4 kg)   GENERAL:alert, no distress and comfortable SKIN: skin color, texture, turgor are normal, no rashes or significant lesions EYES: normal, Conjunctiva are pink and non-injected, sclera clear  NECK: supple, thyroid normal size, non-tender, without nodularity LYMPH:  no palpable lymphadenopathy in the cervical, axillary  LUNGS: clear to auscultation and percussion with normal breathing effort HEART: regular rate & rhythm and no murmurs and no lower extremity edema ABDOMEN:abdomen soft, non-tender and normal bowel sounds Musculoskeletal:no cyanosis of digits and no clubbing  NEURO: alert & oriented x 3 with fluent speech, no focal motor/sensory deficits BREAST: (+) S/p right lumpectomy: Surgical incision healed well (+) 2-3cm seroma at lateral incision site of right breast (+) Skin hyperpigmentation of right breast. No palpable mass, nodules or adenopathy bilaterally. Breast exam benign.   LABORATORY DATA:  I have reviewed the data as listed CBC Latest Ref Rng & Units 11/04/2018 08/13/2018 11/27/2014  WBC 4.0 - 10.5 K/uL 4.4 4.6 4.5  Hemoglobin 12.0 - 15.0 g/dL 11.7(L) 12.0 12.2  Hematocrit 36.0 - 46.0 % 36.2 37.4 36.4  Platelets 150 - 400 K/uL  194 204 206     CMP Latest Ref Rng & Units 11/04/2018 08/13/2018 06/30/2018  Glucose 70 - 99 mg/dL 92 110(H) -  BUN 8 - 23 mg/dL 12 12 -  Creatinine 0.44 - 1.00 mg/dL 0.96 0.91 -  Sodium 135 - 145 mmol/L 142 143 -  Potassium 3.5 - 5.1 mmol/L 3.5 3.5 -  Chloride 98 - 111 mmol/L 106 107 -  CO2 22 - 32 mmol/L 27 26 -  Calcium 8.9 - 10.3 mg/dL 9.8 9.5 -  Total Protein 6.5 - 8.1 g/dL 7.8 8.2(H) 7.5  Total Bilirubin 0.3 - 1.2 mg/dL 0.4 0.5 0.4  Alkaline Phos 38 - 126 U/L 73 79 68  AST 15 - 41 U/L _0 ALT 0 - 44 U/L _1 RADIOGRAPHIC STUDIES: I have personally reviewed the radiological images as listed and agreed  with the findings in the report. No results found.   ASSESSMENT & PLAN:  Mariah Burns is a 73 y.o. female with   1. Malignant neoplasm of upper-outer quadrant of right breast, Stage IA, pT2N1aM0, ER/PR+, HER2-, Grade I, Mammaprint low risk  -She was recently diagnosed in 08/2018. She is s/p right lumpectomy with SLNB on 09/02/18  -We reviewed her pathology report in great detail. Report shows grade I tumor was surgically removed with 1/3 positive lymph nodes, negative margins.  -Her mammaprint showed low risk of distant recurrence with Mammaprint index of 0.287. There is no benefit of adjuvant chemotherapy, I do not recommend it.  -Given positive lymph node increases her risk of recurrence. She is finishing RT with Dr. Earney Hamburg today.  Tolerated well overall.  -Given her ER/PR+ disease, I started her on Anastrozole in 10/2018 while waiting to start radiation.  -She held anastrozole after 2 weeks of taking it due to side effects of insomnia, hot flashes, increased depression, and vaginal itching and discharge & odor.  -I encourage her to continuing a form of antiestrogen therapy to reduce her risk of distant cancer recurrence given her node positive disease. I recommend trying another AI such as Exemestane which can have similar side effects but may tolerate better.  To help with her hot flashes I discussed SSRI such as Effexor or Celexa while side effects takes time to improve. She will think about it. -After a lengthy discussion we decided to wait another 2-3 weeks before starting Exemestane.  -I also discussed a healthy lifestyle can help to reduce her risk of cancer overall. I encouraged her to exercise and eat healthy diet.  -I discussed her option of survivorship clinic. She is interested, will do in 2 months  -f/u with me in 5 months   2. Bone health  -I discussed AI can weaken her bone.  -I will obtain a DEXA scan on 01/01/19  3. Genetic Testing  -She is not very aware of her family history of cancer.  -Given she has 3 daughters, she will consider genetic testing even if out of pocket.   4. HTN and DM -overall controlled, continue to f/u with PCP    PLAN:  -Proceed with last RT today  -I prescribed Exemestane today to start in 2-3 weeks  -DEXA scan on 01/01/19 -Survivorship clinic in 2 months   -Lab and f/u in 5 months    No problem-specific Assessment & Plan notes found for this encounter.   No orders of the defined types were placed in this encounter.  All questions were answered. The patient knows to call the clinic with any problems, questions or concerns. No barriers to learning was detected. I spent 25 minutes counseling the patient face to face. The total time spent in the appointment was 30 minutes and more than 50% was on counseling and review of test results     Mariah Merle, MD 12/30/2018   I, Joslyn Devon, am acting as scribe for Mariah Merle, MD.   I have reviewed the above documentation for accuracy and completeness, and I agree with the above.

## 2018-12-29 ENCOUNTER — Ambulatory Visit
Admission: RE | Admit: 2018-12-29 | Discharge: 2018-12-29 | Disposition: A | Discharge status: Home / Self Care | Payer: Medicare Other | Source: Ambulatory Visit | Attending: Radiation Oncology | Admitting: Radiation Oncology

## 2018-12-29 ENCOUNTER — Other Ambulatory Visit: Payer: Self-pay

## 2018-12-29 DIAGNOSIS — C50411 Malignant neoplasm of upper-outer quadrant of right female breast: Secondary | ICD-10-CM | POA: Diagnosis not present

## 2018-12-30 ENCOUNTER — Encounter: Payer: Self-pay | Admitting: *Deleted

## 2018-12-30 ENCOUNTER — Encounter: Payer: Self-pay | Admitting: Radiation Oncology

## 2018-12-30 ENCOUNTER — Other Ambulatory Visit: Payer: Self-pay

## 2018-12-30 ENCOUNTER — Inpatient Hospital Stay: Payer: Medicare Other | Attending: Hematology | Admitting: Hematology

## 2018-12-30 ENCOUNTER — Ambulatory Visit
Admission: RE | Admit: 2018-12-30 | Discharge: 2018-12-30 | Disposition: A | Discharge status: Home / Self Care | Payer: Medicare Other | Source: Ambulatory Visit | Attending: Radiation Oncology | Admitting: Radiation Oncology

## 2018-12-30 ENCOUNTER — Encounter: Payer: Self-pay | Admitting: Hematology

## 2018-12-30 VITALS — BP 158/66 | HR 66 | Temp 98.5°F | Resp 17 | Ht 59.0 in | Wt 146.4 lb

## 2018-12-30 DIAGNOSIS — N644 Mastodynia: Secondary | ICD-10-CM

## 2018-12-30 DIAGNOSIS — N951 Menopausal and female climacteric states: Secondary | ICD-10-CM | POA: Diagnosis not present

## 2018-12-30 DIAGNOSIS — Z170 Estrogen receptor positive status [ER+]: Secondary | ICD-10-CM | POA: Diagnosis not present

## 2018-12-30 DIAGNOSIS — R5383 Other fatigue: Secondary | ICD-10-CM

## 2018-12-30 DIAGNOSIS — I10 Essential (primary) hypertension: Secondary | ICD-10-CM

## 2018-12-30 DIAGNOSIS — C50411 Malignant neoplasm of upper-outer quadrant of right female breast: Secondary | ICD-10-CM

## 2018-12-30 DIAGNOSIS — E119 Type 2 diabetes mellitus without complications: Secondary | ICD-10-CM | POA: Diagnosis not present

## 2018-12-30 MED ORDER — EXEMESTANE 25 MG PO TABS: 25.0000 mg | ORAL_TABLET | Freq: Every day | ORAL | 3 refills | Status: DC

## 2018-12-31 ENCOUNTER — Telehealth: Payer: Self-pay | Admitting: Hematology

## 2018-12-31 ENCOUNTER — Encounter: Payer: Self-pay | Admitting: *Deleted

## 2018-12-31 NOTE — Progress Notes (Signed)
  Patient Name: Mariah Burns MRN: [id number] DOB: 1946-03-05 Referring Physician: Glendale Chard (Profile Not Attached) Date of Service: 12/30/2018 [facility], [place]                                                        End Of Treatment Note  Diagnoses: C50.411-Malignant neoplasm of upper-outer quadrant of right female breast  Cancer Staging: Stage IA (pT2, pN1a, cM0, G1, ER+, PR+, HER2-)  Intent: Curative  Radiation Treatment Dates: 11/12/2018 through 12/30/2018 Site Technique Total Dose Dose per Fx Completed Fx Beam Energies  Breast: Breast_Rt 3D 50.4/50.4 1.8 28/28 10X  Breast: Breast_Rt_axilla 3D 45/45 1.8 25/25 6X, 15X  Breast: Breast_Rt_Bst 3D 12/12 2 6/6 6X, 10X   Narrative: The patient tolerated radiation therapy relatively well. By the end of treatment, she reported moderate fatigue and occasional sharp-shooting breast pain. She endorsed using skin cream and her breast became hyperpigmented and minimally swollen. No skin breakdown.    Plan: The patient will follow-up with radiation oncology in 1 month.  ________________________________________________ -----------------------------------  Blair Promise, PhD, MD This document serves as a record of services personally performed by Gery Pray, MD. It was created on his behalf by Mary-Margaret Loma Messing, a trained medical scribe. The creation of this record is based on the scribe's personal observations and the provider's statements to them. This document has been checked and approved by the attending provider.

## 2018-12-31 NOTE — Telephone Encounter (Signed)
Scheduled appt per 7/28 los. ° °Printed and mailed appt calendar. °

## 2019-01-01 ENCOUNTER — Other Ambulatory Visit: Payer: Self-pay

## 2019-01-01 ENCOUNTER — Ambulatory Visit
Admission: RE | Admit: 2019-01-01 | Discharge: 2019-01-01 | Disposition: A | Discharge status: Home / Self Care | Payer: Medicare Other | Source: Ambulatory Visit | Attending: Hematology | Admitting: Hematology

## 2019-01-01 DIAGNOSIS — C50411 Malignant neoplasm of upper-outer quadrant of right female breast: Secondary | ICD-10-CM

## 2019-01-01 DIAGNOSIS — Z170 Estrogen receptor positive status [ER+]: Secondary | ICD-10-CM

## 2019-01-04 NOTE — Assessment & Plan Note (Addendum)
Mask may be blowing into eyes. Humidifier may be too low. Discussed humidifier and asked her to get help from her DME.  She does benefit from CPAP Plan- Continue CPAP, changing to auto 5-15

## 2019-01-04 NOTE — Assessment & Plan Note (Signed)
Managed by Oncology

## 2019-01-20 LAB — HM DIABETES EYE EXAM

## 2019-01-23 ENCOUNTER — Telehealth: Payer: Self-pay

## 2019-01-23 ENCOUNTER — Telehealth: Payer: Self-pay | Admitting: Hematology

## 2019-01-23 NOTE — Telephone Encounter (Signed)
Please let her stop exemestane and schedule a phone visit with me in 2 weeks, we will discuss other options. Thanks   Truitt Merle MD

## 2019-01-23 NOTE — Telephone Encounter (Signed)
Scheduled apt per 8/21 sch message - pt is aware of appt date and time   

## 2019-01-23 NOTE — Telephone Encounter (Signed)
Spoke with patient Mariah Burns, per Dr. Burr Medico instructed her to stop for now, will set up phone visit in 2 weeks to see if side effects have gone away.  She verbalized an understanding and a scheduling message was sent.

## 2019-01-23 NOTE — Telephone Encounter (Signed)
Patient calls with complaint of having a lot of side effects from the exemestane.  She is having dizziness, hot flashes and joint pain (being the worst side effect).  She is wanting to know if Dr. Burr Medico will switch her to a different medication.

## 2019-01-28 ENCOUNTER — Encounter: Payer: Self-pay | Admitting: Internal Medicine

## 2019-02-02 ENCOUNTER — Encounter: Payer: Self-pay | Admitting: Radiation Oncology

## 2019-02-02 ENCOUNTER — Ambulatory Visit
Admission: RE | Admit: 2019-02-02 | Discharge: 2019-02-02 | Disposition: A | Discharge status: Home / Self Care | Payer: Medicare Other | Source: Ambulatory Visit | Attending: Radiation Oncology | Admitting: Radiation Oncology

## 2019-02-02 ENCOUNTER — Other Ambulatory Visit: Payer: Self-pay

## 2019-02-02 VITALS — BP 139/58 | HR 67 | Temp 98.5°F | Resp 17 | Wt 144.2 lb

## 2019-02-02 DIAGNOSIS — M8580 Other specified disorders of bone density and structure, unspecified site: Secondary | ICD-10-CM | POA: Insufficient documentation

## 2019-02-02 DIAGNOSIS — Z170 Estrogen receptor positive status [ER+]: Secondary | ICD-10-CM | POA: Diagnosis not present

## 2019-02-02 DIAGNOSIS — Z79899 Other long term (current) drug therapy: Secondary | ICD-10-CM | POA: Diagnosis not present

## 2019-02-02 DIAGNOSIS — Z7982 Long term (current) use of aspirin: Secondary | ICD-10-CM | POA: Insufficient documentation

## 2019-02-02 DIAGNOSIS — M542 Cervicalgia: Secondary | ICD-10-CM | POA: Diagnosis not present

## 2019-02-02 DIAGNOSIS — C50411 Malignant neoplasm of upper-outer quadrant of right female breast: Secondary | ICD-10-CM | POA: Diagnosis present

## 2019-02-02 DIAGNOSIS — Z923 Personal history of irradiation: Secondary | ICD-10-CM | POA: Insufficient documentation

## 2019-02-02 NOTE — Progress Notes (Signed)
Radiation Oncology         [phone] ________________________________  Name: Mariah Burns MRN: [id number]  Date: 02/02/2019  DOB: 10/03/1945  Follow-Up Visit Note  CC: Glendale Chard, MD  Glendale Chard, MD    ICD-10-CM   1. Malignant neoplasm of upper-outer quadrant of right breast in female, estrogen receptor positive ([place])  C50.411    Z17.0     Diagnosis:   StageIA (anatomic stage IIB)(pT2, pN1a,Mx) RightBreast UOQ Invasive Ductal Carcinoma, ER+/ PR+/ Her2-, Grade1  Interval Since Last Radiation:  5 weeks 11/12/2018 - 12/30/2018: Site Technique Total Dose Dose per Fx Completed Fx Beam Energies  Breast: Breast_Rt 3D 50.4/50.4 1.8 28/28 10X  Breast: Breast_Rt_axilla 3D 45/45 1.8 25/25 6X, 15X  Breast: Breast_Rt_Bst 3D 12/12 2 6/6 6X, 10X    Narrative:  The patient returns today for routine follow-up. She is scheduled to follow up with Dr. Burr Medico on 02/12/2019.  Since her last visit, she underwent bone density screening on 01/01/2019. This showed a T-score of -1.3, which is considered osteopenic.  On review of systems, she reports neck pain and residual breast pains. She denies any other symptoms.  The pain is making it difficult for her to sleep at night.  This is primarily located along the right side of the neck  ALLERGIES:  is allergic to fosamax [alendronate sodium]; omeprazole; motrin [ibuprofen]; and fluvastatin.  Meds: Current Outpatient Medications  Medication Sig Dispense Refill  . aspirin 81 MG chewable tablet Chew by mouth daily. Not taking daily    . Caraway Oil-Levomenthol (FDGARD PO) Take 2 tablets by mouth as needed (upset stomach).    . Cholecalciferol (VITAMIN D-3 PO) Take 2,000 Units by mouth daily.    Marland Kitchen co-enzyme Q-10 30 MG capsule Take 30 mg by mouth daily.    . felodipine (PLENDIL) 10 MG 24 hr tablet TAKE 1 TABLET BY MOUTH EVERY DAY 90 tablet 2  . fish oil-omega-3 fatty acids 1000 MG capsule Take 2 capsules by mouth daily.      Marland Kitchen  GLUCOSAMINE-CHONDROITIN PO Take 2 tablets by mouth daily.      Marland Kitchen L-Methylfolate-Algae-B12-B6 (METANX) 3-90.314-2-35 MG CAPS Take 1 capsule by mouth 2 (two) times daily.     . Menthol, Topical Analgesic, (BIOFREEZE ROLL-ON EX) Apply 1 application topically as needed (pain in back,neck,thumb).    . Misc Natural Products (TART CHERRY ADVANCED PO) Take 2 tablets by mouth daily.    . nitroGLYCERIN (NITROSTAT) 0.4 MG SL tablet Place 1 tablet (0.4 mg total) under the tongue every 5 (five) minutes as needed for chest pain. 25 tablet 2  . Polyvinyl Alcohol-Povidone PF (REFRESH) 1.4-0.6 % SOLN Place 1 drop into both eyes daily as needed (dry eyes).     . pravastatin (PRAVACHOL) 40 MG tablet TAKE 1 CAPSULE BY MOUTH ON MONDAY, WEDNESDAY, FRIDAY 30 tablet 1  . traZODone (DESYREL) 50 MG tablet Take 50 mg by mouth at bedtime.     No current facility-administered medications for this encounter.     Physical Findings: The patient is in no acute distress. Patient is alert and oriented.  weight is 144 lb 3.2 oz (65.4 kg). Her temperature is 98.5 F (36.9 C). Her blood pressure is 139/58 (abnormal) and her pulse is 67. Her respiration is 17 and oxygen saturation is 99%. .  Lungs are clear to auscultation bilaterally. Heart has regular rate and rhythm. No palpable cervical, supraclavicular, or axillary adenopathy. Abdomen soft, non-tender, normal bowel sounds.  The breast area with some  hyperpigmentation changes.  Patient skin is well-healed.  Mild swelling in the breast.  No dominant mass appreciated nipple discharge or bleeding.  Some induration noted near The lumpectomy scar consistent with scar tissue.  Lab Findings: Lab Results  Component Value Date   WBC 4.4 11/04/2018   HGB 11.7 (L) 11/04/2018   HCT 36.2 11/04/2018   MCV 87.7 11/04/2018   PLT 194 11/04/2018    Radiographic Findings: No results found.  Impression:  The patient is recovering from the effects of radiation.  Patient skin is healed well  at this time.  She does have pain along the right neck.,  Etiology unknown.  Patient would like to have a limited refill on her tramadol.  This seems reasonable since over-the-counter medications have not been strong enough. If this pain Continues patient may need work-up for this issue.  She denies any numbness or weakness along her right arm.  Plan: PRN follow-up in radiation oncology.  The patient will be seen in medical oncology next month.  She thus far has been intolerant of adjuvant hormonal therapy.  ____________________________________ Gery Pray, MD   This document serves as a record of services personally performed by Gery Pray, MD. It was created on his behalf by Wilburn Mylar, a trained medical scribe. The creation of this record is based on the scribe's personal observations and the provider's statements to them. This document has been checked and approved by the attending provider.

## 2019-02-02 NOTE — Patient Instructions (Signed)
Coronavirus (COVID-19) Are you at risk?  Are you at risk for the Coronavirus (COVID-19)?  To be considered HIGH RISK for Coronavirus (COVID-19), you have to meet the following criteria:  . Traveled to [place], [place], [place], [place] or [place]; or in the [place] to [place], [place], [place], or [place]; and have fever, cough, and shortness of breath within the last 2 weeks of travel OR . Been in close contact with a person diagnosed with COVID-19 within the last 2 weeks and have fever, cough, and shortness of breath . IF YOU DO NOT MEET THESE CRITERIA, YOU ARE CONSIDERED LOW RISK FOR COVID-19.  What to do if you are HIGH RISK for COVID-19?  . If you are having a medical emergency, call 911. . Seek medical care right away. Before you go to a doctor's office, urgent care or emergency department, call ahead and tell them about your recent travel, contact with someone diagnosed with COVID-19, and your symptoms. You should receive instructions from your physician's office regarding next steps of care.  . When you arrive at healthcare provider, tell the healthcare staff immediately you have returned from visiting [place], [place], [place], [place] or [place]; or traveled in the [place] to [place], [place], [place], or [place]; in the last two weeks or you have been in close contact with a person diagnosed with COVID-19 in the last 2 weeks.   . Tell the health care staff about your symptoms: fever, cough and shortness of breath. . After you have been seen by a medical provider, you will be either: o Tested for (COVID-19) and discharged home on quarantine except to seek medical care if symptoms worsen, and asked to  - Stay home and avoid contact with others until you get your results (4-5 days)  - Avoid travel on public transportation if possible (such as bus, train, or airplane) or o Sent to the Emergency Department by EMS for evaluation, COVID-19 testing, and possible  admission depending on your condition and test results.  What to do if you are LOW RISK for COVID-19?  Reduce your risk of any infection by using the same precautions used for avoiding the common cold or flu:  . Wash your hands often with soap and warm water for at least 20 seconds.  If soap and water are not readily available, use an alcohol-based hand sanitizer with at least 60% alcohol.  . If coughing or sneezing, cover your mouth and nose by coughing or sneezing into the elbow areas of your shirt or coat, into a tissue or into your sleeve (not your hands). . Avoid shaking hands with others and consider head nods or verbal greetings only. . Avoid touching your eyes, nose, or mouth with unwashed hands.  . Avoid close contact with people who are sick. . Avoid places or events with large numbers of people in one location, like concerts or sporting events. . Carefully consider travel plans you have or are making. . If you are planning any travel outside or inside the [place], visit the CDC's Travelers' Health webpage for the latest health notices. . If you have some symptoms but not all symptoms, continue to monitor at home and seek medical attention if your symptoms worsen. . If you are having a medical emergency, call 911.   ADDITIONAL HEALTHCARE OPTIONS FOR PATIENTS  [facility] / e-Visit: [email or web address]         [facility]: [phone]  [facility]: [phone]                   [facility]: [phone]   

## 2019-02-02 NOTE — Progress Notes (Signed)
Patient in for follow up doing well. Denies any issues. She is having pain in her neck using neck pillow. Also have regeneration pain in breast. BP (!) 139/58   Pulse 67   Temp 98.5 F (36.9 C)   Resp 17   Wt 144 lb 3.2 oz (65.4 kg)   SpO2 99%   BMI 29.12 kg/m  Filed Weights   02/02/19 1134  Weight: 144 lb 3.2 oz (65.4 kg)

## 2019-02-03 ENCOUNTER — Other Ambulatory Visit: Payer: Self-pay | Admitting: Radiation Oncology

## 2019-02-03 MED ORDER — TRAMADOL HCL 50 MG PO TABS: 25.0000 mg | ORAL_TABLET | Freq: Four times a day (QID) | ORAL | 0 refills | Status: DC | PRN

## 2019-02-03 NOTE — Progress Notes (Signed)
Cardiology Office Note:    Date:  02/04/2019   ID:  NAHJA TRUGLIO, DOB 1945/06/05, MRN [id number]  PCP:  Glendale Chard, MD  Cardiologist:  Sinclair Grooms, MD   Referring MD: Glendale Chard, MD   Chief Complaint  Patient presents with  . Irregular Heart Beat  . Hypertension    History of Present Illness:    Mariah Burns is a 73 y.o. female with a hx of Mild LVH, sarcoidosis, mild pulmonary hypertension, hypertension, hyperlipidemia, and diabetes.essential   The patient has multiple comorbidities for vascular disease.  She denies chest pain.  She is not having claudication.  She had mild left ventricular hypertrophy on echocardiogram 2016.  There is a history of sarcoidosis.  Whether the increased LV thickness was related to hypertension with hypertrophy or due to infiltration from sarcoid is unknown.  She complained of occasional episodes of palpitations that can last up to 20 minutes.  Heart is racing during these episodes.  She has some mild dyspnea on exertion.  Otherwise there are no complaints.  She is compliant with her current medical regimen.  She denies alcohol intake and does not smoke.   Past Medical History:  Diagnosis Date  . Anxiety   . Cancer ([place]) 08/2018   right breast IDC  . Hyperlipidemia   . Hypertension   . OSA (obstructive sleep apnea)    NPSG 03/08/2010  AHI 19.8/hr, CPAP nightly  . Osteopenia   . Pre-diabetes   . 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 BREAST LUMPECTOMY WITH RADIOACTIVE SEED AND SENTINEL LYMPH NODE BIOPSY Right 09/02/2018   Procedure: RIGHT BREAST LUMPECTOMY WITH RADIOACTIVE SEED AND  RIGHT SENTINEL LYMPH NODE BIOPSY;  Surgeon: Excell Seltzer, MD;  Location: [facility];  Service: General;  Laterality: Right;  . BRONCHOSCOPY     dx'd sarcoid  .  KNEE ARTHROPLASTY Left 03/13/2017  . NEUROPLASTY / TRANSPOSITION ULNAR NERVE AT ELBOW  09/2011  . TOTAL ABDOMINAL HYSTERECTOMY      Current Medications: Current Meds  Medication Sig  . aspirin 81 MG chewable tablet Chew 81 mg by mouth once a week. Not taking daily   . Caraway Oil-Levomenthol (FDGARD PO) Take 2 tablets by mouth as needed (upset stomach).  . Cholecalciferol (VITAMIN D-3 PO) Take 2,000 Units by mouth daily.  Marland Kitchen co-enzyme Q-10 30 MG capsule Take 30 mg by mouth daily.  . felodipine (PLENDIL) 10 MG 24 hr tablet TAKE 1 TABLET BY MOUTH EVERY DAY  . fish oil-omega-3 fatty acids 1000 MG capsule Take 2 capsules by mouth daily.    Marland Kitchen GLUCOSAMINE-CHONDROITIN PO Take 2 tablets by mouth daily.    Marland Kitchen L-Methylfolate-Algae-B12-B6 (METANX) 3-90.314-2-35 MG CAPS Take 1 capsule by mouth 2 (two) times daily.   . Menthol, Topical Analgesic, (BIOFREEZE ROLL-ON EX) Apply 1 application topically as needed (pain in back,neck,thumb).  . Misc Natural Products (TART CHERRY ADVANCED PO) Take 2 tablets by mouth daily.  . nitroGLYCERIN (NITROSTAT) 0.4 MG SL tablet Place 1 tablet (0.4 mg total) under the tongue every 5 (five) minutes as needed for chest pain.  . Polyvinyl Alcohol-Povidone PF (REFRESH) 1.4-0.6 % SOLN Place 1 drop into both eyes daily as needed (dry eyes).   . pravastatin (PRAVACHOL) 40 MG tablet TAKE 1 CAPSULE BY MOUTH ON MONDAY, WEDNESDAY, FRIDAY  .  traMADol (ULTRAM) 50 MG tablet Take 0.5 tablets (25 mg total) by mouth every 6 (six) hours as needed for moderate pain.     Allergies:   Fosamax [alendronate sodium], Omeprazole, Motrin [ibuprofen], and Fluvastatin   Social History   Socioeconomic History  . Marital status: Divorced    Spouse name: Not on file  . Number of children: 3  . Years of education: College  . Highest education level: Not on file  Occupational History  . Occupation: Retired    Fish farm manager: RETIRED  Social Needs  . Financial resource strain: Not hard at all  . Food  insecurity    Worry: Never true    Inability: Never true  . Transportation needs    Medical: No    Non-medical: No  Tobacco Use  . Smoking status: Never Smoker  . Smokeless tobacco: Never Used  Substance and Sexual Activity  . Alcohol use: No  . Drug use: No  . Sexual activity: Not Currently  Lifestyle  . Physical activity    Days per week: 7 days    Minutes per session: 20 min  . Stress: Not at all  Relationships  . Social Herbalist on phone: Not on file    Gets together: Not on file    Attends religious service: Not on file    Active member of club or organization: Not on file    Attends meetings of clubs or organizations: Not on file    Relationship status: Not on file  Other Topics Concern  . Not on file  Social History Narrative   Patient lives at home with daughter.   Caffeine Use: none     Family History: The patient's family history includes Allergies in her daughter, mother, and sister; Gout in her brother and brother; Healthy in her daughter and daughter; Heart disease in her brother, sister, and sister; Heart failure in her father; Hypertension in her brother, brother, brother, and brother; Lupus in her sister; Post-traumatic stress disorder in her brother.  ROS:   Please see the history of present illness.    Recent diagnosis of breast cancer completing right lumpectomy and radiation therapy.  Tingling paresthesias throughout her body that are momentary.  All other systems reviewed and are negative.  EKGs/Labs/Other Studies Reviewed:    The following studies were reviewed today: 2D Doppler echocardiogram 2016: Study Conclusions  - Left ventricle: The cavity size was normal. Wall thickness was   increased in a pattern of mild LVH. Systolic function was   vigorous. The estimated ejection fraction was in the range of 65%   to 70%. Wall motion was normal; there were no regional wall   motion abnormalities. Doppler parameters are consistent with    abnormal left ventricular relaxation (grade 1 diastolic   dysfunction). - Aortic valve: There was no stenosis. There was trivial   regurgitation. - Mitral valve: There was trivial regurgitation. - Left atrium: The atrium was mildly dilated. - Right ventricle: The cavity size was normal. Systolic function   was normal. - Tricuspid valve: Peak RV-RA gradient (S): 39 mm Hg. - Pulmonary arteries: PA peak pressure: 42 mm Hg (S). - Inferior vena cava: The vessel was normal in size. The   respirophasic diameter changes were in the normal range (= 50%),   consistent with normal central venous pressure. - Pericardium, extracardiac: Small circumferential pericardial   effusion.  Impressions:  - Normal LV size with mild LV hypertrophy. EF 65-70%. Normal RV  size and systolic function. No significant valvular   abnormalities. Small pericardial effusion. Mild pulmonary   hypertension. EKG:  EKG performed on 12/11/2018 demonstrates right bundle, first-degree AV block, and prominent voltage consistent with LVH.  Recent Labs: 11/04/2018: ALT 16; BUN 12; Creatinine 0.96; Hemoglobin 11.7; Platelet Count 194; Potassium 3.5; Sodium 142  Recent Lipid Panel    Component Value Date/Time   CHOL 188 12/11/2018 1212   TRIG 61 12/11/2018 1212   HDL 64 12/11/2018 1212   CHOLHDL 2.9 12/11/2018 1212   LDLCALC 112 (H) 12/11/2018 1212    Physical Exam:    VS:  BP (!) 148/62   Pulse 69   Ht 4\' 11"  (1.499 m)   Wt 144 lb 6.4 oz (65.5 kg)   SpO2 97%   BMI 29.17 kg/m     Wt Readings from Last 3 Encounters:  02/04/19 144 lb 6.4 oz (65.5 kg)  02/02/19 144 lb 3.2 oz (65.4 kg)  12/30/18 146 lb 6.4 oz (66.4 kg)     GEN: Appears younger than stated age. No acute distress HEENT: Normal NECK: No JVD. LYMPHATICS: No lymphadenopathy CARDIAC:  RRR without murmur, gallop, or edema. VASCULAR:  Normal Pulses. No bruits. RESPIRATORY:  Clear to auscultation without rales, wheezing or rhonchi  ABDOMEN: Soft,  non-tender, non-distended, No pulsatile mass, MUSCULOSKELETAL: No deformity  SKIN: Warm and dry NEUROLOGIC:  Alert and oriented x 3 PSYCHIATRIC:  Normal affect   ASSESSMENT:    1. Pericardial effusion   2. Essential hypertension   3. Sarcoidosis   4. Incomplete right bundle branch block   5. Type 2 diabetes mellitus with complication, without long-term current use of insulin ([place])   6. Educated About Covid-19 Virus Infection    PLAN:    In order of problems listed above:  1. Follow-up with 2D Doppler echocardiogram 2. Echocardiogram will be done to rule out progression of LV wall thickness.  Consider infiltration from sarcoid.  Blood pressure is not well controlled today.  She will record her blood pressure 2-3 times per week over the next 2 weeks and report results.  Since she has LV thickness increase, we need to tightly control blood pressure to 130/80 mmHg or less.  Today the blood pressure is above 145 on 2 separate measurements. 3. The disease is quiesced sent at the current time. 4. EKG noted above. 5. Diabetes with good hemoglobin A1c when last evaluated. 6. Social distancing, masking, and handwashing is emphasized to avoid COVID-19.  Target BP: <130/80 mmHg  Diet and lifestyle measures for BP control were reviewed in detail: Low sodium diet (<2.5 gm daily); alcohol restriction (<3 ounces per day); weight loss (Mediterranean); avoid non-steroidal agents; > 6 hours sleep per day; 150 min moderate exercise per week. Medical regimen will include at least 2 agents. Resistant hypertension if not controlled on 3 agents. Consider further evaluation: Sleep study to r/o OSA; Renal angiogram; Primary hyperaldonism and Pheochromocytoma w/u. After 3 agents, consider MRA (spironolactone)/ Epleronone), hydralazine, beta-blocker, and Minoxidil if not already in use due to patient profile.  Clinical follow-up in 1 year.  Echocardiogram will be done.  If blood pressure is elevated,  appropriate adjustments in medication therapy will ensue and she will likely need to be seen sooner than 1 year.   Medication Adjustments/Labs and Tests Ordered: Current medicines are reviewed at length with the patient today.  Concerns regarding medicines are outlined above.  Orders Placed This Encounter  Procedures  . ECHOCARDIOGRAM COMPLETE   No orders of  the defined types were placed in this encounter.   Patient Instructions  Medication Instructions:  Your physician recommends that you continue on your current medications as directed. Please refer to the Current Medication list given to you today.  If you need a refill on your cardiac medications before your next appointment, please call your pharmacy.   Lab work: None If you have labs (blood work) drawn today and your tests are completely normal, you will receive your results only by: Marland Kitchen MyChart Message (if you have MyChart) OR . A paper copy in the mail If you have any lab test that is abnormal or we need to change your treatment, we will call you to review the results.  Testing/Procedures: Your physician has requested that you have an echocardiogram. Echocardiography is a painless test that uses sound waves to create images of your heart. It provides your doctor with information about the size and shape of your heart and how well your heart's chambers and valves are working. This procedure takes approximately one hour. There are no restrictions for this procedure.  Follow-Up: At [facility], you and your health needs are our priority.  As part of our continuing mission to provide you with exceptional heart care, we have created designated Provider Care Teams.  These Care Teams include your primary Cardiologist (physician) and Advanced Practice Providers (APPs -  Physician Assistants and Nurse Practitioners) who all work together to provide you with the care you need, when you need it. You will need a follow up appointment in 12  months.  Please call our office 2 months in advance to schedule this appointment.  You may see Sinclair Grooms, MD or one of the following Advanced Practice Providers on your designated Care Team:   Truitt Merle, NP Cecilie Kicks, NP . Kathyrn Drown, NP  Any Other Special Instructions Will Be Listed Below (If Applicable).  Monitor your blood pressure 2-3 times per week for the next two weeks and contact our office with those readings.    Dr. Tamala Julian advises that you increase your activity and decrease the amount of salt in your diet.        Signed, Sinclair Grooms, MD  02/04/2019 11:32 AM    [place]

## 2019-02-04 ENCOUNTER — Ambulatory Visit (INDEPENDENT_AMBULATORY_CARE_PROVIDER_SITE_OTHER): Payer: Medicare Other | Admitting: Interventional Cardiology

## 2019-02-04 ENCOUNTER — Other Ambulatory Visit: Payer: Self-pay

## 2019-02-04 ENCOUNTER — Encounter: Payer: Self-pay | Admitting: Interventional Cardiology

## 2019-02-04 VITALS — BP 148/62 | HR 69 | Ht 59.0 in | Wt 144.4 lb

## 2019-02-04 DIAGNOSIS — I4510 Unspecified right bundle-branch block: Secondary | ICD-10-CM | POA: Diagnosis not present

## 2019-02-04 DIAGNOSIS — D869 Sarcoidosis, unspecified: Secondary | ICD-10-CM

## 2019-02-04 DIAGNOSIS — I3139 Other pericardial effusion (noninflammatory): Secondary | ICD-10-CM

## 2019-02-04 DIAGNOSIS — I313 Pericardial effusion (noninflammatory): Secondary | ICD-10-CM

## 2019-02-04 DIAGNOSIS — E118 Type 2 diabetes mellitus with unspecified complications: Secondary | ICD-10-CM

## 2019-02-04 DIAGNOSIS — Z7189 Other specified counseling: Secondary | ICD-10-CM

## 2019-02-04 DIAGNOSIS — I10 Essential (primary) hypertension: Secondary | ICD-10-CM | POA: Diagnosis not present

## 2019-02-04 NOTE — Patient Instructions (Addendum)
Medication Instructions:  Your physician recommends that you continue on your current medications as directed. Please refer to the Current Medication list given to you today.  If you need a refill on your cardiac medications before your next appointment, please call your pharmacy.   Lab work: None If you have labs (blood work) drawn today and your tests are completely normal, you will receive your results only by: Marland Kitchen MyChart Message (if you have MyChart) OR . A paper copy in the mail If you have any lab test that is abnormal or we need to change your treatment, we will call you to review the results.  Testing/Procedures: Your physician has requested that you have an echocardiogram. Echocardiography is a painless test that uses sound waves to create images of your heart. It provides your doctor with information about the size and shape of your heart and how well your heart's chambers and valves are working. This procedure takes approximately one hour. There are no restrictions for this procedure.  Follow-Up: At [facility], you and your health needs are our priority.  As part of our continuing mission to provide you with exceptional heart care, we have created designated Provider Care Teams.  These Care Teams include your primary Cardiologist (physician) and Advanced Practice Providers (APPs -  Physician Assistants and Nurse Practitioners) who all work together to provide you with the care you need, when you need it. You will need a follow up appointment in 12 months.  Please call our office 2 months in advance to schedule this appointment.  You may see Sinclair Grooms, MD or one of the following Advanced Practice Providers on your designated Care Team:   Truitt Merle, NP Cecilie Kicks, NP . Kathyrn Drown, NP  Any Other Special Instructions Will Be Listed Below (If Applicable).  Monitor your blood pressure 2-3 times per week for the next two weeks and contact our office with those  readings.    Dr. Tamala Julian advises that you increase your activity and decrease the amount of salt in your diet.

## 2019-02-06 NOTE — Progress Notes (Signed)
Mariah Burns   [phone] [phone]   Clinic Follow up Note   Patient Care Team: Glendale Chard, MD as PCP - General (Internal Medicine) Belva Crome, MD as PCP - Cardiology (Cardiology) Mauro Kaufmann, RN as Oncology Nurse Navigator Rockwell Germany, RN as Oncology Nurse Navigator Excell Seltzer, MD as Consulting Physician (General Surgery) Truitt Merle, MD as Consulting Physician (Hematology) Gery Pray, MD as Consulting Physician (Radiation Oncology)   Date of Service:  02/12/2019   CHIEF COMPLAINT: F/u of right breast cancer   SUMMARY OF ONCOLOGIC HISTORY: Oncology History Overview Note  Cancer Staging Malignant neoplasm of upper-outer quadrant of right breast in female, estrogen receptor positive ([place]) Staging form: Breast, AJCC 8th Edition - Clinical stage from 08/04/2018: Stage IA (cT1c, cN0, cM0, G2, ER+, PR+, HER2-) - Signed by Truitt Merle, MD on 08/12/2018 - Pathologic stage from 09/02/2018: Stage IA (pT2, pN1a, cM0, G1, ER+, PR+, HER2-) - Signed by Truitt Merle, MD on 09/29/2018     Malignant neoplasm of upper-outer quadrant of right breast in female, estrogen receptor positive ([place])  07/29/2018 Mammogram   Diagnostic Mammgram 07/29/18  IMPRESSION:  The 1.7cm oval mass in the right breast upper outer qudrant middle third, 3-5 from nipple is suspicious of malignancy.    08/04/2018 Cancer Staging   Staging form: Breast, AJCC 8th Edition - Clinical stage from 08/04/2018: Stage IA (cT1c, cN0, cM0, G2, ER+, PR+, HER2-) - Signed by Truitt Merle, MD on 08/12/2018   08/04/2018 Initial Biopsy   Diagnosis 08/04/18  Breast, right, needle core biopsy, 1.7 cm irregular solid mass at 9 o'clock, 4 cm fn - INVASIVE DUCTAL CARCINOMA WITH EXTRACELLULAR MUCIN.   08/04/2018 Receptors her2   Results: IMMUNOHISTOCHEMICAL AND MORPHOMETRIC ANALYSIS PERFORMED MANUALLY The tumor cells are NEGATIVE for Her2 (0). Estrogen Receptor: 100%, POSITIVE, STRONG STAINING  INTENSITY Progesterone Receptor: 100%, POSITIVE, STRONG STAINING INTENSITY Proliferation Marker Ki67: 10%   08/11/2018 Initial Diagnosis   Malignant neoplasm of upper-outer quadrant of right breast in female, estrogen receptor positive ([place])   09/02/2018 Surgery   RIGHT BREAST LUMPECTOMY WITH RADIOACTIVE SEED AND  RIGHT SENTINEL LYMPH NODE BIOPSY by Dr. Excell Seltzer  09/02/18    09/02/2018 Pathology Results   Diagnosis 1. Breast, lumpectomy, Right w/seed - INVASIVE DUCTAL CARCINOMA WITH EXTRACELLULAR MUCIN, GRADE I, 2.1 CM. - DUCTAL CARCINOMA IN SITU, INTERMEDIATE NUCLEAR GRADE. - INVASIVE CARCINOMA IS 2 MM FROM SUPERIOR MARGIN AND 2.5 MM FROM LATERAL MARGIN. - DUCTAL CARCINOMA IN SITU IS LESS THAN 1 MM FROM ANTERIOR MARGIN, 4 MM FROM SUPERIOR AND LATERAL MARGINS, 5 MM FROM INFERIOR MARGIN AND 8 MM FROM THE POSTERIOR MARGIN. - SMALL INTRADUCTAL PAPILLOMA. - NO EVIDENCE OF LYMPHOVASCULAR OR PERINEURAL INVASION. - BIOPSY SITE CHANGES. - SEE ONCOLOGY TABLE. 2. Lymph node, sentinel, biopsy, Right Axillary - LYMPH NODE, NEGATIVE FOR CARCINOMA (0/1). 3. Lymph node, sentinel, biopsy, Right - LYMPH NODE, NEGATIVE FOR CARCINOMA (0/1). 4. Lymph node, sentinel, biopsy, Right - METASTATIC BREAST CARCINOMA TO ONE LYMPH NODE (1/1). - METASTATIC FOCUS MEASURES 0.4 CM IN GREATEST DIMENSION. - NO DEFINITE EVIDENCE OF EXTRANODAL EXTENSION.   09/02/2018 Miscellaneous   MammaPrint  09/02/18 Low risk with 10% average 10-year risk of recurrnce untreated  Mammaprint Index: +0.287 97.8% of patients treated with hormonal therapy have no distnat recurrance.    09/02/2018 Cancer Staging   Staging form: Breast, AJCC 8th Edition - Pathologic stage from 09/02/2018: Stage IA (pT2, pN1a, cM0, G1, ER+, PR+, HER2-) - Signed by Truitt Merle,  MD on 09/29/2018   10/05/2018 - 01/2019 Anti-estrogen oral therapy   Anastrozole 59m daily starting 10/05/18; stopped 5/18 due to insomnia, hot flashes, depression, and vaginal irritation.   I switched her Exemestane in 01/2019. She started having dizziness, hot flashes and joint pain so she stopped two days later.  -She tried for no more than 3 weeks total   11/12/2018 - 12/30/2018 Radiation Therapy   RT 11/12/18-12/30/18 with Dr. KEarney Hamburg      CURRENT THERAPY:  Surveillance   INTERVAL HISTORY:  Mariah PEACHEYis here for a follow up of right breast cancer. She notes she is doing well. She stopped Exemestane a month ago after taking it for 2 days. She had dizziness, nausea and overall poor toleration. She notes her depression much improved off anastrozole and Exemestane. She notes no history of blood clots and had a partial hysterectomy decades ago. Since her prior poor tolerance, she is very apprehensive about trying another medication with similar side effects.  She notes muscular or nerve pain in her right upper shoulder and neck especially when she turns her head. This has been ongoing since her radiation treatment.     REVIEW OF SYSTEMS:   Constitutional: Denies fevers, chills or abnormal weight loss Eyes: Denies blurriness of vision Ears, nose, mouth, throat, and face: Denies mucositis or sore throat Respiratory: Denies cough, dyspnea or wheezes Cardiovascular: Denies palpitation, chest discomfort or lower extremity swelling Gastrointestinal:  Denies nausea, heartburn or change in bowel habits Skin: Denies abnormal skin rashes MSK: (+) Upper right shoulder and right neck pain Lymphatics: Denies new lymphadenopathy or easy bruising Neurological:Denies numbness, tingling or new weaknesses Behavioral/Psych: Mood is stable, no new changes  All other systems were reviewed with the patient and are negative.  MEDICAL HISTORY:  Past Medical History:  Diagnosis Date  . Anxiety   [place]   [place] IDC  . Hyperlipidemia   . Hypertension   . OSA (obstructive sleep apnea)    NPSG 03/08/2010  AHI 19.8/hr, CPAP nightly  . Osteopenia   . Pre-diabetes    . 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 BREAST LUMPECTOMY WITH RADIOACTIVE SEED AND SENTINEL LYMPH NODE BIOPSY Right 09/02/2018   Procedure: RIGHT BREAST LUMPECTOMY WITH RADIOACTIVE SEED AND  RIGHT SENTINEL LYMPH NODE BIOPSY;  Surgeon: HExcell Seltzer MD;  [facility]  Service: General;  Laterality: Right;  . BRONCHOSCOPY     dx'd sarcoid  . KNEE ARTHROPLASTY Left 03/13/2017  . NEUROPLASTY / TRANSPOSITION ULNAR NERVE AT ELBOW  09/2011  . TOTAL ABDOMINAL HYSTERECTOMY      I have reviewed the social history and family history with the patient and they are unchanged from previous note.  ALLERGIES:  is allergic to fosamax [alendronate sodium]; omeprazole; motrin [ibuprofen]; and fluvastatin.  MEDICATIONS:  Current Outpatient Medications  Medication Sig Dispense Refill  . aspirin 81 MG chewable tablet Chew 81 mg by mouth once a week. Not taking daily     . [place] Oil-Levomenthol (FDGARD PO) Take 2 tablets by mouth as needed (upset stomach).    . Cholecalciferol (VITAMIN D-3 PO) Take 2,000 Units by mouth daily.    .Marland Kitchenco-enzyme Q-10 30 MG capsule Take 30 mg by mouth daily.    . felodipine (PLENDIL) 10 MG 24  hr tablet TAKE 1 TABLET BY MOUTH EVERY DAY 90 tablet 2  . fish oil-omega-3 fatty acids 1000 MG capsule Take 2 capsules by mouth daily.      Marland Kitchen GLUCOSAMINE-CHONDROITIN PO Take 2 tablets by mouth daily.      Marland Kitchen L-Methylfolate-Algae-B12-B6 (METANX) 3-90.314-2-35 MG CAPS Take 1 capsule by mouth 2 (two) times daily.     . Menthol, Topical Analgesic, (BIOFREEZE ROLL-ON EX) Apply 1 application topically as needed (pain in back,neck,thumb).    . Misc Natural Products (TART CHERRY ADVANCED PO) Take 2 tablets by mouth daily.    . nitroGLYCERIN (NITROSTAT) 0.4 MG SL tablet Place 1 tablet (0.4 mg  total) under the tongue every 5 (five) minutes as needed for chest pain. 25 tablet 2  . Polyvinyl Alcohol-Povidone PF (REFRESH) 1.4-0.6 % SOLN Place 1 drop into both eyes daily as needed (dry eyes).     . pravastatin (PRAVACHOL) 40 MG tablet TAKE 1 CAPSULE BY MOUTH ON MONDAY, WEDNESDAY, FRIDAY 30 tablet 1  . traMADol (ULTRAM) 50 MG tablet Take 0.5 tablets (25 mg total) by mouth every 6 (six) hours as needed for moderate pain. 15 tablet 0   No current facility-administered medications for this visit.     PHYSICAL EXAMINATION: ECOG PERFORMANCE STATUS: 1 - Symptomatic but completely ambulatory  Vitals:   02/12/19 1055  BP: (!) 141/61  Pulse: 65  Resp: 20  Temp: 98.9 F (37.2 C)  SpO2: 100%   Filed Weights   02/12/19 1055  Weight: 144 lb 14.4 oz (65.7 kg)    GENERAL:alert, no distress and comfortable SKIN: skin color, texture, turgor are normal, no rashes or significant lesions EYES: normal, Conjunctiva are pink and non-injected, sclera clear  NECK: supple, thyroid normal size, non-tender, without nodularity LYMPH:  no palpable lymphadenopathy in the cervical, axillary LUNGS: clear to auscultation and percussion with normal breathing effort HEART: regular rate & rhythm and no murmurs and no lower extremity edema ABDOMEN:abdomen soft, non-tender and normal bowel sounds Musculoskeletal:no cyanosis of digits and no clubbing (+) mild soft tissue swelling of right upper shoulder and right neck NEURO: alert & oriented x 3 with fluent speech, no focal motor/sensory deficits BREAST: S/p right lumpectomy: Surgical incision healed well (+) 2x2.5cm nodule at 8:00 position of right breast (+) Mild skin hyperpigmentation from RT. No palpable adenopathy bilaterally. Breast exam benign.    LABORATORY DATA:  I have reviewed the data as listed CBC Latest Ref Rng & Units 11/04/2018 08/13/2018 11/27/2014  WBC 4.0 - 10.5 K/uL 4.4 4.6 4.5  Hemoglobin 12.0 - 15.0 g/dL 11.7(L) 12.0 12.2  Hematocrit 36.0  - 46.0 % 36.2 37.4 36.4  Platelets 150 - 400 K/uL 194 204 206     CMP Latest Ref Rng & Units 11/04/2018 08/13/2018 06/30/2018  Glucose 70 - 99 mg/dL 92 110(H) -  BUN 8 - 23 mg/dL 12 12 -  Creatinine 0.44 - 1.00 mg/dL 0.96 0.91 -  Sodium 135 - 145 mmol/L 142 143 -  Potassium 3.5 - 5.1 mmol/L 3.5 3.5 -  Chloride 98 - 111 mmol/L 106 107 -  CO2 22 - 32 mmol/L 27 26 -  Calcium 8.9 - 10.3 mg/dL 9.8 9.5 -  Total Protein 6.5 - 8.1 g/dL 7.8 8.2(H) 7.5  Total Bilirubin 0.3 - 1.2 mg/dL 0.4 0.5 0.4  Alkaline Phos 38 - 126 U/L 73 79 68  AST 15 - 41 U/L _0 ALT 0 - 44 U/L 16 14 22  RADIOGRAPHIC STUDIES: I have personally reviewed the radiological images as listed and agreed with the findings in the report. No results found.   ASSESSMENT & PLAN:  Mariah Burns is a 73 y.o. female with   1. Malignant neoplasm of upper-outer quadrant of right breast, Stage IA,pT2N1aM0, ER/PR+, HER2-, Grade I, Mammaprint low risk -She was diagnosed in 08/2018. She is s/p right lumpectomy with SLNB on 09/02/18 and is s/p adjuvant Radiation therapy.  -Her mammaprint showed low risk of distantrecurrencewithMammaprint index of 0.287. Adjuvant chemo was not recommended -Given her ER/PR+ disease, I started her on Anastrozolein 10/2018. Given side effects of insomnia, hot flashes, increased depression, and vaginal itching and discharge &odor after 2 weeks she stopped. I switched her Exemestane in 01/2019. She started having dizziness, hot flashes and joint pain so she stopped 2 days after. She is very apprehensive about trying another antiestrogen therapy.  -I again discussed her risk of distant recurrence, which is estimated around 10% based on Mammaprint. I encourage her to consider antiestrogen therapy to reduce her risk of recurrence -I recommend her to try Tamoxifen. She has not had blood clots before and has had partial hysterectomy. Her risk of forming blood clots is low and risk of endometrial cancer  is not there. There is still possible common side effects such as hot flushes, mood swings, reduced metabolism and vaginal discharge etc. I gave her print out of the medication material. She will think about it and contact me if she decides to try. She appears to be very reluctant to start due to concerns of side effects and previous poor tolerance to AI.  -From a breast cancer standpoint she is clinically doing well. Physical exam shows right breast nodule likely related to her surgery. She also has mild swelling of her right upper shoulder and neck likely muscular related. There is no clinical concern for cancer recurrence. I encouraged her to watch for new and significant bone pain, weight loss. I also encouraged her to maintain exercise and healthy diet with reduced read meat and carbohydrates and more fruits and vegetables.  -Continue Surveillance. Next mammogram in 08/2019 -I offered her the chance to attend survivorship clinic. She is interested, will schedule in 3 months.  -f/u in 6 months  -She opted to receive flu shot today   2. Osteopenia  -I discussed AI can weaken her bone.  -12/2018 DEXA shows osteopenia with lowest T-score -1.3 at left femur neck. She has a estimated fracture risk is 4.5% in the next 10 and 0.7% risk of hip fracture in the next 10 years.  -I encouraged her to continue 2000 unit Vitamin D3 and start oral calcium. I also recommend weight bearing exercise such as walking and some weight lifting.   3. Genetic Testing  -She is not very aware of her family history of cancer.  -Given she has 3 daughters, she will consider genetic testing even if out of pocket.   4. HTN and DM -overall controlled, continue to f/u with PCP   5. Right upper shoulder and right neck pain -Likely muscular and positional related. Ongoing since her RT 12/2018.  -She has mild right low neck and [place] area tenderness on exam today (02/12/19) -I encouraged her use heating pad and f/u with her PCP    PLAN: -I recommend her to try Tamoxifen, she will think about it and call me if she decides to try -Proceed with flu shot today  -Survivorship clinic in 3 months  -Lab, and f/u  in 6 months  -Mammogram in 08/2019   No problem-specific Assessment & Plan notes found for this encounter.   Orders Placed This Encounter  Procedures  . MM DIAG BREAST TOMO BILATERAL    Standing Status:   Future    Standing Expiration Date:   02/12/2020    Scheduling Instructions:     [facility]    Order Specific Question:   Reason for Exam (SYMPTOM  OR DIAGNOSIS REQUIRED)    Answer:   screening    Order Specific Question:   Preferred imaging location?    Answer:   External   All questions were answered. The patient knows to call the clinic with any problems, questions or concerns. No barriers to learning was detected. I spent 20 minutes counseling the patient face to face. The total time spent in the appointment was 25 minutes and more than 50% was on counseling and review of test results    Truitt Merle, MD 02/12/2019   I, Joslyn Devon, am acting as scribe for Truitt Merle, MD.   I have reviewed the above documentation for accuracy and completeness, and I agree with the above.

## 2019-02-12 ENCOUNTER — Other Ambulatory Visit: Payer: Self-pay

## 2019-02-12 ENCOUNTER — Encounter: Payer: Self-pay | Admitting: Hematology

## 2019-02-12 ENCOUNTER — Telehealth: Payer: Self-pay | Admitting: Hematology

## 2019-02-12 ENCOUNTER — Inpatient Hospital Stay: Payer: Medicare Other | Attending: Hematology | Admitting: Hematology

## 2019-02-12 VITALS — BP 141/61 | HR 65 | Temp 98.9°F | Resp 20 | Ht 59.0 in | Wt 144.9 lb

## 2019-02-12 DIAGNOSIS — Z79899 Other long term (current) drug therapy: Secondary | ICD-10-CM | POA: Insufficient documentation

## 2019-02-12 DIAGNOSIS — Z170 Estrogen receptor positive status [ER+]: Secondary | ICD-10-CM

## 2019-02-12 DIAGNOSIS — F329 Major depressive disorder, single episode, unspecified: Secondary | ICD-10-CM | POA: Insufficient documentation

## 2019-02-12 DIAGNOSIS — G4733 Obstructive sleep apnea (adult) (pediatric): Secondary | ICD-10-CM | POA: Insufficient documentation

## 2019-02-12 DIAGNOSIS — Z923 Personal history of irradiation: Secondary | ICD-10-CM | POA: Diagnosis not present

## 2019-02-12 DIAGNOSIS — Z90710 Acquired absence of both cervix and uterus: Secondary | ICD-10-CM | POA: Insufficient documentation

## 2019-02-12 DIAGNOSIS — I10 Essential (primary) hypertension: Secondary | ICD-10-CM | POA: Insufficient documentation

## 2019-02-12 DIAGNOSIS — Z23 Encounter for immunization: Secondary | ICD-10-CM

## 2019-02-12 DIAGNOSIS — E119 Type 2 diabetes mellitus without complications: Secondary | ICD-10-CM | POA: Insufficient documentation

## 2019-02-12 DIAGNOSIS — Z79811 Long term (current) use of aromatase inhibitors: Secondary | ICD-10-CM | POA: Insufficient documentation

## 2019-02-12 DIAGNOSIS — F419 Anxiety disorder, unspecified: Secondary | ICD-10-CM | POA: Diagnosis not present

## 2019-02-12 DIAGNOSIS — E785 Hyperlipidemia, unspecified: Secondary | ICD-10-CM | POA: Insufficient documentation

## 2019-02-12 DIAGNOSIS — M8580 Other specified disorders of bone density and structure, unspecified site: Secondary | ICD-10-CM | POA: Diagnosis not present

## 2019-02-12 DIAGNOSIS — C50411 Malignant neoplasm of upper-outer quadrant of right female breast: Secondary | ICD-10-CM | POA: Diagnosis not present

## 2019-02-12 DIAGNOSIS — Z7982 Long term (current) use of aspirin: Secondary | ICD-10-CM | POA: Diagnosis not present

## 2019-02-12 MED ORDER — INFLUENZA VAC SPLIT QUAD 0.5 ML IM SUSY: 0.5000 mL | PREFILLED_SYRINGE | Freq: Once | INTRAMUSCULAR | Status: DC

## 2019-02-12 MED ORDER — INFLUENZA VAC A&B SA ADJ QUAD 0.5 ML IM PRSY
0.5000 mL | PREFILLED_SYRINGE | Freq: Once | INTRAMUSCULAR | Status: AC
  Administered 2019-02-12: 0.5 mL via INTRAMUSCULAR
  Filled 2019-02-12: qty 0.5

## 2019-02-12 NOTE — Telephone Encounter (Signed)
Scheduled appt per 9/10 los. ° °Sent a message and a calendar will be mailed out. °

## 2019-02-13 ENCOUNTER — Ambulatory Visit (HOSPITAL_COMMUNITY): Payer: Medicare Other | Attending: Cardiology

## 2019-02-13 DIAGNOSIS — I10 Essential (primary) hypertension: Secondary | ICD-10-CM | POA: Diagnosis present

## 2019-02-14 ENCOUNTER — Encounter: Payer: Self-pay | Admitting: Internal Medicine

## 2019-02-16 ENCOUNTER — Other Ambulatory Visit: Payer: Self-pay | Admitting: Internal Medicine

## 2019-02-27 ENCOUNTER — Encounter: Payer: Self-pay | Admitting: *Deleted

## 2019-03-02 ENCOUNTER — Encounter: Payer: Medicare Other | Admitting: Nurse Practitioner

## 2019-03-18 ENCOUNTER — Ambulatory Visit: Payer: Medicare Other | Admitting: Internal Medicine

## 2019-05-01 ENCOUNTER — Telehealth: Payer: Self-pay | Admitting: Nurse Practitioner

## 2019-05-01 NOTE — Telephone Encounter (Signed)
Called patient regarding 12/10 appointment, per patient's request this will be a phone visit.

## 2019-05-13 ENCOUNTER — Telehealth: Payer: Self-pay | Admitting: Nurse Practitioner

## 2019-05-13 NOTE — Telephone Encounter (Signed)
Left VM to confirm telephone visit for 12/10 and to verify pt's info

## 2019-05-14 ENCOUNTER — Inpatient Hospital Stay: Payer: Medicare Other | Attending: Hematology | Admitting: Nurse Practitioner

## 2019-05-14 ENCOUNTER — Encounter: Payer: Self-pay | Admitting: Nurse Practitioner

## 2019-05-14 DIAGNOSIS — Z170 Estrogen receptor positive status [ER+]: Secondary | ICD-10-CM

## 2019-05-14 DIAGNOSIS — C50411 Malignant neoplasm of upper-outer quadrant of right female breast: Secondary | ICD-10-CM

## 2019-05-14 MED ORDER — TAMOXIFEN CITRATE 20 MG PO TABS: 20.0000 mg | ORAL_TABLET | Freq: Every day | ORAL | 3 refills | Status: DC

## 2019-05-14 NOTE — Progress Notes (Signed)
CLINIC:  Survivorship   Patient Care Team: Glendale Chard, MD as PCP - General (Internal Medicine) Belva Crome, MD as PCP - Cardiology (Cardiology) Mauro Kaufmann, RN as Oncology Nurse Navigator Rockwell Germany, RN as Oncology Nurse Navigator Excell Seltzer, MD (Inactive) as Consulting Physician (General Surgery) Truitt Merle, MD as Consulting Physician (Hematology) Gery Pray, MD as Consulting Physician (Radiation Oncology) Alla Feeling, NP as Nurse Practitioner (Nurse Practitioner)  I connected with [name] on 05/14/19 at  9:45 AM EST by telephone visit and verified that I am speaking with the correct person using two identifiers.   I discussed the limitations, risks, security and privacy concerns of performing an evaluation and management service by telemedicine and the availability of in-person appointments. I also discussed with the patient that there may be a patient responsible charge related to this service. The patient expressed understanding and agreed to proceed.   Other persons participating in the visit and their role in the encounter: None  Patients location: Home  Providers location: my office, [place] VISIT:  Routine follow-up post-treatment for a recent history of breast cancer.  BRIEF ONCOLOGIC HISTORY:  Oncology History Overview Note  Cancer Staging Malignant neoplasm of upper-outer quadrant of right breast in female, estrogen receptor positive ([place]) Staging form: Breast, AJCC 8th Edition - Clinical stage from 08/04/2018: Stage IA (cT1c, cN0, cM0, G2, ER+, PR+, HER2-) - Signed by Truitt Merle, MD on 08/12/2018 - Pathologic stage from 09/02/2018: Stage IA (pT2, pN1a, cM0, G1, ER+, PR+, HER2-) - Signed by Truitt Merle, MD on 09/29/2018     Malignant neoplasm of upper-outer quadrant of right breast in female, estrogen receptor positive ([place])  07/29/2018 Mammogram   Diagnostic Mammgram 07/29/18  IMPRESSION:  The 1.7cm oval mass in the  right breast upper outer qudrant middle third, 3-5 from nipple is suspicious of malignancy.    08/04/2018 Cancer Staging   Staging form: Breast, AJCC 8th Edition - Clinical stage from 08/04/2018: Stage IA (cT1c, cN0, cM0, G2, ER+, PR+, HER2-) - Signed by Truitt Merle, MD on 08/12/2018   08/04/2018 Initial Biopsy   Diagnosis 08/04/18  Breast, right, needle core biopsy, 1.7 cm irregular solid mass at 9 o'clock, 4 cm fn - INVASIVE DUCTAL CARCINOMA WITH EXTRACELLULAR MUCIN.   08/04/2018 Receptors her2   Results: IMMUNOHISTOCHEMICAL AND MORPHOMETRIC ANALYSIS PERFORMED MANUALLY The tumor cells are NEGATIVE for Her2 (0). Estrogen Receptor: 100%, POSITIVE, STRONG STAINING INTENSITY Progesterone Receptor: 100%, POSITIVE, STRONG STAINING INTENSITY Proliferation Marker Ki67: 10%   08/11/2018 Initial Diagnosis   Malignant neoplasm of upper-outer quadrant of right breast in female, estrogen receptor positive ([place])   09/02/2018 Surgery   RIGHT BREAST LUMPECTOMY WITH RADIOACTIVE SEED AND  RIGHT SENTINEL LYMPH NODE BIOPSY by Dr. Excell Seltzer  09/02/18    09/02/2018 Pathology Results   Diagnosis 1. Breast, lumpectomy, Right w/seed - INVASIVE DUCTAL CARCINOMA WITH EXTRACELLULAR MUCIN, GRADE I, 2.1 CM. - DUCTAL CARCINOMA IN SITU, INTERMEDIATE NUCLEAR GRADE. - INVASIVE CARCINOMA IS 2 MM FROM SUPERIOR MARGIN AND 2.5 MM FROM LATERAL MARGIN. - DUCTAL CARCINOMA IN SITU IS LESS THAN 1 MM FROM ANTERIOR MARGIN, 4 MM FROM SUPERIOR AND LATERAL MARGINS, 5 MM FROM INFERIOR MARGIN AND 8 MM FROM THE POSTERIOR MARGIN. - SMALL INTRADUCTAL PAPILLOMA. - NO EVIDENCE OF LYMPHOVASCULAR OR PERINEURAL INVASION. - BIOPSY SITE CHANGES. - SEE ONCOLOGY TABLE. 2. Lymph node, sentinel, biopsy, Right Axillary - LYMPH NODE, NEGATIVE FOR CARCINOMA (0/1). 3. Lymph node, sentinel, biopsy, Right - LYMPH  NODE, NEGATIVE FOR CARCINOMA (0/1). 4. Lymph node, sentinel, biopsy, Right - METASTATIC BREAST CARCINOMA TO ONE LYMPH NODE (1/1). - METASTATIC  FOCUS MEASURES 0.4 CM IN GREATEST DIMENSION. - NO DEFINITE EVIDENCE OF EXTRANODAL EXTENSION.   09/02/2018 Miscellaneous   MammaPrint  09/02/18 Low risk with 10% average 10-year risk of recurrnce untreated  Mammaprint Index: +0.287 97.8% of patients treated with hormonal therapy have no distnat recurrance.    09/02/2018 Cancer Staging   Staging form: Breast, AJCC 8th Edition - Pathologic stage from 09/02/2018: Stage IA (pT2, pN1a, cM0, G1, ER+, PR+, HER2-) - Signed by Truitt Merle, MD on 09/29/2018   10/05/2018 - 01/2019 Anti-estrogen oral therapy   Anastrozole 59m daily starting 10/05/18; stopped 5/18 due to insomnia, hot flashes, depression, and vaginal irritation.  I switched her Exemestane in 01/2019. She started having dizziness, hot flashes and joint pain so she stopped two days later.  -She tried for no more than 3 weeks total   11/12/2018 - 12/30/2018 Radiation Therapy   RT 11/12/18-12/30/18 with Dr. KEarney Hamburg   05/14/2019 Survivorship   SCP per LCira Rue NP    05/14/2019 -  Anti-estrogen oral therapy   Patient agrees to restart anti-estrogen therapy with Tamoxifen      INTERVAL HISTORY:  [facility] to review her survivorship care plan detailing her treatment course for breast cancer, as well as monitoring long-term side effects of that treatment, education regarding health maintenance, screening, and overall wellness and health promotion.     Overall, Ms. TGirgisreports feeling well in general. She has sporadic "shocks" occasionally in her feet, legs, etc. Right breast is slightly discolored but otherwise no concerns in her breasts such as new lump, masses, tenderness, nipple inversion or discharge. She denies change in her appetite or weight, although she is trying to lose weight. Denies change in bowel habits, new abdominal pain or bone pain. Her mood fluctuates. She doesn't sleep well, may try melatonin. No  recent fever or infection lately.    ONCOLOGY TREATMENT TEAM:  1. Surgeon:  [facility] transferred care to Dr. NLucia Gaskins2. Medical Oncologist: Dr. FBurr Medico3. Radiation Oncologist: Dr. 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: failed Imuran & Prednisone therapy d/t adverse side effects TLC 106%, DLCO 75% 2009   Past Surgical History:  Procedure Laterality Date   BREAST LUMPECTOMY WITH RADIOACTIVE SEED AND SENTINEL LYMPH NODE BIOPSY Right 09/02/2018   Procedure: RIGHT BREAST LUMPECTOMY WITH RADIOACTIVE SEED AND  RIGHT SENTINEL LYMPH NODE BIOPSY;  Surgeon: HExcell Seltzer MD;  [place]  Service: General;  Laterality: Right;   BRONCHOSCOPY     dx'd sarcoid   KNEE ARTHROPLASTY Left 03/13/2017   NEUROPLASTY / TRANSPOSITION ULNAR NERVE AT ELBOW  09/2011   TOTAL ABDOMINAL HYSTERECTOMY       ALLERGIES:  Allergies  Allergen Reactions   Fosamax [Alendronate Sodium] Other (See Comments)    REACTION: "CAUSE GI UPSET AND FATIGUE"   Omeprazole Other (See Comments)    Lower ab pain  Motrin [Ibuprofen] Other (See Comments)    GI upset- has to eat prior to taking   Fluvastatin Other (See Comments)    mild memoryproblems of confusion      CURRENT MEDICATIONS:  Outpatient Encounter Medications as of 05/14/2019  Medication Sig   aspirin 81 MG chewable tablet Chew 81 mg by mouth once a week. Not taking daily    [place] Oil-Levomenthol (FDGARD PO) Take 2 tablets by mouth as needed (upset stomach).   Cholecalciferol (VITAMIN D-3 PO) Take 2,000 Units by mouth daily.   co-enzyme Q-10 30 MG capsule Take 30 mg by mouth daily.     felodipine (PLENDIL) 10 MG 24 hr tablet TAKE 1 TABLET BY MOUTH EVERY DAY   fish oil-omega-3 fatty acids 1000 MG capsule Take 2 capsules by mouth daily.     GLUCOSAMINE-CHONDROITIN PO Take 2 tablets by mouth daily.     L-Methylfolate-Algae-B12-B6 (METANX) 3-90.314-2-35 MG CAPS Take 1 capsule by mouth 2 (two) times daily.    Menthol, Topical Analgesic, (BIOFREEZE ROLL-ON EX) Apply 1 application topically as needed (pain in back,neck,thumb).   Misc Natural Products (TART CHERRY ADVANCED PO) Take 2 tablets by mouth daily.   nitroGLYCERIN (NITROSTAT) 0.4 MG SL tablet Place 1 tablet (0.4 mg total) under the tongue every 5 (five) minutes as needed for chest pain.   Polyvinyl Alcohol-Povidone PF (REFRESH) 1.4-0.6 % SOLN Place 1 drop into both eyes daily as needed (dry eyes).    pravastatin (PRAVACHOL) 40 MG tablet TAKE 1 TABLET BY MOUTH ON MONDAY, WEDNESDAY, AND FRIDAY   tamoxifen (NOLVADEX) 20 MG tablet Take 1 tablet (20 mg total) by mouth daily.   traMADol (ULTRAM) 50 MG tablet Take 0.5 tablets (25 mg total) by mouth every 6 (six) hours as needed for moderate pain.   No facility-administered encounter medications on file as of 05/14/2019.     ONCOLOGIC FAMILY HISTORY:  Family History  Problem Relation Age of Onset   Allergies Sister    Heart disease Sister    Lupus Sister    Allergies Mother    Allergies Daughter    Healthy Daughter    Heart disease Sister    Gout Brother    Hypertension Brother    Heart disease Brother    Heart failure Father    Gout Brother    Hypertension Brother    Hypertension Brother    Hypertension Brother    Post-traumatic stress disorder Brother    Healthy Daughter      GENETIC COUNSELING/TESTING: No  SOCIAL HISTORY:  KAYLISE BLAKELEY is widowed and lives alone. She has 3 adult children. She denies any current or history of tobacco, alcohol, or illicit drug use.     PHYSICAL EXAMINATION:    There were no vitals filed  for this visit. There were no vitals filed for this visit.  Patient appears well over the phone. Speech is clear and non-pressured. Mood and affect appear normal. No cough or conversational dyspnea   LABORATORY DATA:  None for this visit.  DIAGNOSTIC IMAGING:  None for this visit.      ASSESSMENT AND PLAN:  Ms.. Defibaugh is a pleasant 73 y.o. 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  She presents to the Survivorship Clinic for our initial meeting and routine follow-up post-completion of treatment for breast cancer.  1. Stage I right breast cancer:  Ms. Leclaire has recovered well from definitive treatment for breast cancer. Based on her report to me in today's phone visit, I have no concern for breast cancer recurrence. She will follow-up with her medical oncologist, Dr. Burr Medico later this month per patient's preference with history and physical exam per surveillance protocol.  She was on AI with anastrozole then exemestane initially from 10/2018 to 01/2019 but did not tolerate well with worsening depression. Her mood is good lately. We reviewed rationale for adjuvant endocrine therapy and I recommend to try Tamoxifen. Though the incidence is low, there is an associated risk of endometrial cancer with Tamoxifen.  Ms. Panos has had partial hysterectomy. She was encouraged to contact Dr. Burr Medico or myself with any vaginal bleeding while taking Tamoxifen. Other side effects of Tamoxifen were reviewed with her as well, she agrees to try. Today, a comprehensive survivorship care plan and treatment summary was reviewed with the patient today detailing her breast cancer diagnosis, treatment course, potential late/long-term effects of treatment, appropriate follow-up care with recommendations  for the future, and patient education resources.  A copy of this summary, along with a letter will be sent to the patients primary care provider via In Basket message after todays visit.    2. Bone health: Giver her age and history, she is at risk for bone demineralization.  Her last DEXA scan was 12/2018 which shows osteopenia which showed osteopenia in the left femur neck. She will be starting Tamoxifen which we reviewed has bone strengthening quality. Repeat DEXA in 2 years. She takes vitamin D and was encouraged to take calcium supplement and increase her consumption of foods rich in calcium, as well as increase her weight-bearing activities.  She was given education on specific activities to promote bone health.  3. Cancer screening:  Due to Ms. Morabito's history and her age, she should receive screening for skin cancers, colon cancer, and gynecologic cancers. She is s/p hysterectomy. Her last colonoscopy was in 2010, per patient her GI did not offer colonoscopy this year but perhaps in 2 years. We reviewed concerning signs ot report such as weight loss, abd pain, change in bowel habits, or GI bleeding. The information and recommendations are listed on the patient's comprehensive care plan/treatment summary and were reviewed in detail with the patient.    4. Health maintenance and wellness promotion: Ms. Vanalstine was encouraged to consume 5-7 servings of fruits and vegetables per day. We reviewed the "Nutrition Rainbow" handout, as well as the handout "Take Control of Your Health and Reduce Your Cancer Risk" from the [employer].  She was also encouraged to engage in moderate to vigorous exercise for 30 minutes per day most days of the week. We discussed the [employer] fitness program, which is designed for cancer survivors to help them become more physically fit after cancer treatments.  She was instructed to limit her alcohol consumption and continue to abstain from tobacco use.       5. Support services/counseling: It is not uncommon for this period of the patient's cancer care trajectory to be one of many emotions and stressors.  We discussed an opportunity for her to participate in the next session of [facility] ("Finding Your New Normal") support group series designed for patients after they have completed treatment.   Ms. Matlack was encouraged to take advantage of our many other support services programs, support groups, and/or counseling in coping with her new life as a cancer survivor  after completing anti-cancer treatment.  She was offered support today through active listening and expressive supportive counseling.  She was given information regarding our available services and encouraged to contact me with any questions or for help enrolling in any of our support group/programs. She is aware support services are offered virtually now due to [place].    Dispo:   -Return to cancer center 06/01/19 (per patient's preference to keep this appointment as previously scheduled -Rx: Tamoxifen 1 tab daily, plan for 5 years if she can tolerate it -Mammogram due in 08/2019 -Follow up with surgery annually, 03/2020  -She is welcome to return back to the Survivorship Clinic at any time; no additional follow-up needed at this time.  -Consider referral back to survivorship as a long-term survivor for continued surveillance  A total of (45) minutes of non-face-to-face time was spent with this patient with greater than 50% of that time in counseling and care-coordination.   Cira Rue, NP Survivorship Program [place] [phone]   Note: PRIMARY CARE PROVIDER Glendale Chard, [place] [phone]

## 2019-05-15 ENCOUNTER — Telehealth: Payer: Self-pay | Admitting: Nurse Practitioner

## 2019-05-15 NOTE — Telephone Encounter (Signed)
No los per 12/10 

## 2019-05-20 ENCOUNTER — Telehealth: Payer: Self-pay | Admitting: Hematology

## 2019-05-20 NOTE — Telephone Encounter (Signed)
Called patient per providers request, patient agreed to do phone visit on 12/28. Labs cancelled.

## 2019-05-22 NOTE — Progress Notes (Signed)
[place]   [phone] [phone]   Clinic Follow up Note   Patient Care Team: Glendale Chard, MD as PCP - General (Internal Medicine) Belva Crome, MD as PCP - Cardiology (Cardiology) Mauro Kaufmann, RN as Oncology Nurse Navigator Rockwell Germany, RN as Oncology Nurse Navigator Excell Seltzer, MD (Inactive) as Consulting Physician (General Surgery) Truitt Merle, MD as Consulting Physician (Hematology) Gery Pray, MD as Consulting Physician (Radiation Oncology) Alla Feeling, NP as Nurse Practitioner (Nurse Practitioner)   I connected with Mariah Burns on 06/01/2019 at 11:20 AM EST by telephone visit and verified that I am speaking with the correct person using two identifiers.  I discussed the limitations, risks, security and privacy concerns of performing an evaluation and management service by telephone and the availability of in person appointments. I also discussed with the patient that there may be a patient responsible charge related to this service. The patient expressed understanding and agreed to proceed.   Patient's location:  Her home  Provider's location:  My Office  CHIEF COMPLAINT: F/u of right breast cancer  SUMMARY OF ONCOLOGIC HISTORY: Oncology History Overview Note  Cancer Staging Malignant neoplasm of upper-outer quadrant of right breast in female, estrogen receptor positive ([place]) Staging form: Breast, AJCC 8th Edition - Clinical stage from 08/04/2018: Stage IA (cT1c, cN0, cM0, G2, ER+, PR+, HER2-) - Signed by Truitt Merle, MD on 08/12/2018 - Pathologic stage from 09/02/2018: Stage IA (pT2, pN1a, cM0, G1, ER+, PR+, HER2-) - Signed by Truitt Merle, MD on 09/29/2018     Malignant neoplasm of upper-outer quadrant of right breast in female, estrogen receptor positive ([place])  07/29/2018 Mammogram   Diagnostic Mammgram 07/29/18  IMPRESSION:  The 1.7cm oval mass in the right breast upper outer qudrant middle third, 3-5 from  nipple is suspicious of malignancy.    08/04/2018 Cancer Staging   Staging form: Breast, AJCC 8th Edition - Clinical stage from 08/04/2018: Stage IA (cT1c, cN0, cM0, G2, ER+, PR+, HER2-) - Signed by Truitt Merle, MD on 08/12/2018   08/04/2018 Initial Biopsy   Diagnosis 08/04/18  Breast, right, needle core biopsy, 1.7 cm irregular solid mass at 9 o'clock, 4 cm fn - INVASIVE DUCTAL CARCINOMA WITH EXTRACELLULAR MUCIN.   08/04/2018 Receptors her2   Results: IMMUNOHISTOCHEMICAL AND MORPHOMETRIC ANALYSIS PERFORMED MANUALLY The tumor cells are NEGATIVE for Her2 (0). Estrogen Receptor: 100%, POSITIVE, STRONG STAINING INTENSITY Progesterone Receptor: 100%, POSITIVE, STRONG STAINING INTENSITY Proliferation Marker Ki67: 10%   08/11/2018 Initial Diagnosis   Malignant neoplasm of upper-outer quadrant of right breast in female, estrogen receptor positive ([place])   09/02/2018 Surgery   RIGHT BREAST LUMPECTOMY WITH RADIOACTIVE SEED AND  RIGHT SENTINEL LYMPH NODE BIOPSY by Dr. Excell Seltzer  09/02/18    09/02/2018 Pathology Results   Diagnosis 1. Breast, lumpectomy, Right w/seed - INVASIVE DUCTAL CARCINOMA WITH EXTRACELLULAR MUCIN, GRADE I, 2.1 CM. - DUCTAL CARCINOMA IN SITU, INTERMEDIATE NUCLEAR GRADE. - INVASIVE CARCINOMA IS 2 MM FROM SUPERIOR MARGIN AND 2.5 MM FROM LATERAL MARGIN. - DUCTAL CARCINOMA IN SITU IS LESS THAN 1 MM FROM ANTERIOR MARGIN, 4 MM FROM SUPERIOR AND LATERAL MARGINS, 5 MM FROM INFERIOR MARGIN AND 8 MM FROM THE POSTERIOR MARGIN. - SMALL INTRADUCTAL PAPILLOMA. - NO EVIDENCE OF LYMPHOVASCULAR OR PERINEURAL INVASION. - BIOPSY SITE CHANGES. - SEE ONCOLOGY TABLE. 2. Lymph node, sentinel, biopsy, Right Axillary - LYMPH NODE, NEGATIVE FOR CARCINOMA (0/1). 3. Lymph node, sentinel, biopsy, Right - LYMPH NODE, NEGATIVE FOR CARCINOMA (0/1). 4. Lymph  node, sentinel, biopsy, Right - METASTATIC BREAST CARCINOMA TO ONE LYMPH NODE (1/1). - METASTATIC FOCUS MEASURES 0.4 CM IN GREATEST DIMENSION. - NO DEFINITE  EVIDENCE OF EXTRANODAL EXTENSION.   09/02/2018 Miscellaneous   MammaPrint  09/02/18 Low risk with 10% average 10-year risk of recurrnce untreated  Mammaprint Index: +0.287 97.8% of patients treated with hormonal therapy have no distnat recurrance.    09/02/2018 Cancer Staging   Staging form: Breast, AJCC 8th Edition - Pathologic stage from 09/02/2018: Stage IA (pT2, pN1a, cM0, G1, ER+, PR+, HER2-) - Signed by Truitt Merle, MD on 09/29/2018   10/05/2018 - 01/2019 Anti-estrogen oral therapy   Anastrozole 45m daily starting 10/05/18; stopped 5/18 due to insomnia, hot flashes, depression, and vaginal irritation.  I switched her Exemestane in 01/2019. She started having dizziness, hot flashes and joint pain so she stopped two days later.  -She tried for no more than 3 weeks total   11/12/2018 - 12/30/2018 Radiation Therapy   RT 11/12/18-12/30/18 with Dr. KEarney Hamburg   05/14/2019 Survivorship   SCP per LCira Rue NP    06/2019 -  Anti-estrogen oral therapy   Tamoxifen 236mdaily starting 06/2019 she is willing to try but has low threshold to stop.       CURRENT THERAPY:  Tamoxifen 2025maily starting 06/2019  INTERVAL HISTORY:  Mariah Burns here for a follow up of right breast cancer. She notes she is doing well. She notes she did not try Tamoxifen that was given by NP Laice this month. She has been apprehensive due to side effects such as joint pain, depression. She notes electric shocks occasionally. She will feel it in different places such as foot, leg or arm. This only lasts seconds.  She notes her last colonoscopy was in 2010. She notes her DM is doing well and her BP has been fluctuating from lower to high such as 147102stolic. She plans to see her PCP in 06/2019.    REVIEW OF SYSTEMS:   Constitutional: Denies fevers, chills or abnormal weight loss Eyes: Denies blurriness of vision Ears, nose, mouth, throat, and face: Denies mucositis or sore throat Respiratory: Denies cough, dyspnea or  wheezes Cardiovascular: Denies palpitation, chest discomfort or lower extremity swelling Gastrointestinal:  Denies nausea, heartburn or change in bowel habits Skin: Denies abnormal skin rashes Lymphatics: Denies new lymphadenopathy or easy bruising Neurological:Denies numbness, tingling or new weaknesses (+) Occasional shocks around body  Behavioral/Psych: Mood is stable, no new changes  All other systems were reviewed with the patient and are negative.  MEDICAL HISTORY:  Past Medical History:  Diagnosis Date  . Anxiety   . Cancer ([facility]2020   right breast IDC  . Hyperlipidemia   . Hypertension   . OSA (obstructive sleep apnea)    NPSG 03/08/2010  AHI 19.8/hr, CPAP nightly  . Osteopenia   . Pre-diabetes   . 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 BREAST LUMPECTOMY WITH RADIOACTIVE SEED AND SENTINEL LYMPH NODE BIOPSY Right 09/02/2018   Procedure: RIGHT BREAST LUMPECTOMY WITH RADIOACTIVE SEED AND  RIGHT SENTINEL LYMPH NODE BIOPSY;  Surgeon: HoxExcell SeltzerD;  [facility];  Laterality: Right;  . BRONCHOSCOPY     dx'd sarcoid  . KNEE ARTHROPLASTY Left 03/13/2017  . NEUROPLASTY / TRANSPOSITION ULNAR  NERVE AT ELBOW  09/2011  . TOTAL ABDOMINAL HYSTERECTOMY      I have reviewed the social history and family history with the patient and they are unchanged from previous note.  ALLERGIES:  is allergic to fosamax [alendronate sodium]; omeprazole; motrin [ibuprofen]; and fluvastatin.  MEDICATIONS:  Current Outpatient Medications  Medication Sig Dispense Refill  . aspirin 81 MG chewable tablet Chew 81 mg by mouth once a week. Not taking daily     . [place] Oil-Levomenthol (FDGARD PO) Take 2 tablets by mouth as needed (upset stomach).    . Cholecalciferol (VITAMIN D-3  PO) Take 2,000 Units by mouth daily.    Marland Kitchen co-enzyme Q-10 30 MG capsule Take 30 mg by mouth daily.    . felodipine (PLENDIL) 10 MG 24 hr tablet TAKE 1 TABLET BY MOUTH EVERY DAY 90 tablet 2  . fish oil-omega-3 fatty acids 1000 MG capsule Take 2 capsules by mouth daily.      Marland Kitchen GLUCOSAMINE-CHONDROITIN PO Take 2 tablets by mouth daily.      Marland Kitchen L-Methylfolate-Algae-B12-B6 (METANX) 3-90.314-2-35 MG CAPS Take 1 capsule by mouth 2 (two) times daily.     . Menthol, Topical Analgesic, (BIOFREEZE ROLL-ON EX) Apply 1 application topically as needed (pain in back,neck,thumb).    . Misc Natural Products (TART CHERRY ADVANCED PO) Take 2 tablets by mouth daily.    . nitroGLYCERIN (NITROSTAT) 0.4 MG SL tablet Place 1 tablet (0.4 mg total) under the tongue every 5 (five) minutes as needed for chest pain. 25 tablet 2  . Polyvinyl Alcohol-Povidone PF (REFRESH) 1.4-0.6 % SOLN Place 1 drop into both eyes daily as needed (dry eyes).     . pravastatin (PRAVACHOL) 40 MG tablet TAKE 1 TABLET BY MOUTH ON MONDAY, WEDNESDAY, AND FRIDAY 90 tablet 1  . tamoxifen (NOLVADEX) 20 MG tablet Take 1 tablet (20 mg total) by mouth daily. 90 tablet 3  . traMADol (ULTRAM) 50 MG tablet Take 0.5 tablets (25 mg total) by mouth every 6 (six) hours as needed for moderate pain. 15 tablet 0   No current facility-administered medications for this visit.    PHYSICAL EXAMINATION: ECOG PERFORMANCE STATUS: 0 - Asymptomatic  No vitals taken today, Exam not performed today   LABORATORY DATA:  I have reviewed the data as listed CBC Latest Ref Rng & Units 11/04/2018 08/13/2018 11/27/2014  WBC 4.0 - 10.5 K/uL 4.4 4.6 4.5  Hemoglobin 12.0 - 15.0 g/dL 11.7(L) 12.0 12.2  Hematocrit 36.0 - 46.0 % 36.2 37.4 36.4  Platelets 150 - 400 K/uL 194 204 206     CMP Latest Ref Rng & Units 11/04/2018 08/13/2018 06/30/2018  Glucose 70 - 99 mg/dL 92 110(H) -  BUN 8 - 23 mg/dL 12 12 -  Creatinine 0.44 - 1.00 mg/dL 0.96 0.91 -  Sodium 135 - 145 mmol/L 142 143 -    Potassium 3.5 - 5.1 mmol/L 3.5 3.5 -  Chloride 98 - 111 mmol/L 106 107 -  CO2 22 - 32 mmol/L 27 26 -  Calcium 8.9 - 10.3 mg/dL 9.8 9.5 -  Total Protein 6.5 - 8.1 g/dL 7.8 8.2(H) 7.5  Total Bilirubin 0.3 - 1.2 mg/dL 0.4 0.5 0.4  Alkaline Phos 38 - 126 U/L 73 79 68  AST 15 - 41 U/L 20 18 27   ALT 0 - 44 U/L 16 14 22       RADIOGRAPHIC STUDIES: I have personally reviewed the radiological images as listed and agreed with the findings in the report. No results found.  ASSESSMENT & PLAN:  Mariah Burns is a 73 y.o. female with   1. Malignant neoplasm of upper-outer quadrant of right breast, Stage IA,pT2N1aM0, ER/PR+, HER2-, Grade I, Mammaprint low risk -She was diagnosed in 08/2018. She is s/p right lumpectomy with SLNB on 09/02/18 and is s/p adjuvant Radiation therapy.  -Her mammaprint showed low risk of distantrecurrencewithMammaprint index of 0.287. Adjuvant chemo was not recommended -Given her ER/PR+ disease,Istarted her onAnastrozolein 10/2018. Given side effects ofinsomnia, hot flashes, increased depression, and vaginal itching and discharge &odor after 2 weeks she stopped. I switched her Exemestane in 01/2019. She started having dizziness, hot flashes and joint pain so she stopped 2 days after. I encouraged her to try Tamoxifen which she was prescribed a month ago. If not tolerable she is fine to stop and contact us. After a lengthy discussion, She is agreeable to try in 06/2019.  -She is clinically doing well with no major or breast concerns. She will continue to f/u with her PCP about her Dm and HTN and I recommend she f/u with Dr. Collene Mares for colon cancer screening.  -Continue Surveillance. Next mammogram in 08/2019 -f/u in 6 months  2. Osteopenia  -12/2018 DEXA shows osteopenia with lowest T-score -1.3 at left femur neck. She has a estimated fracture risk is 4.5% in the next 10 and 0.7% risk of hip fracture in the next 10 years.  -I encouraged her to continue 2000 unit  Vitamin D3 and start oral calcium. I also recommend weight bearing exercise such as walking and some weight lifting.  -She is no longer on AI due to poor tolerance.   3. Genetic Testing  -She is not very aware of her family history of cancer.  -Given she has 3 daughters, she will consider genetic testing even if out of pocket.   4. HTN and DM -overall controlled, continue to f/u with PCP  -Continue medications  5. Right upper shoulder and right neck pain -Likely muscular and positional related. Ongoing since her RT 12/2018.  -Much improved now.   6. Cancer Screening -Her last colonoscopy was in 2010. I recommend she proceed with last colonoscopy in 2021 with Dr. Collene Mares or at least stool test.    PLAN: -she agree to try Tamoxifen in 06/2019, OK to stop if she could not tolerate, she will call us.  -Lab, and f/u in 6 months  -Mammogram in 08/2019   No problem-specific Assessment & Plan notes found for this encounter.   Orders Placed This Encounter  Procedures  . MM DIAG BREAST TOMO BILATERAL    Standing Status:   Future    Standing Expiration Date:   05/31/2020    Order Specific Question:   Reason for Exam (SYMPTOM  OR DIAGNOSIS REQUIRED)    Answer:   screening    Order Specific Question:   Preferred imaging location?    Answer:   [facility]   I discussed the assessment and treatment plan with the patient. The patient was provided an opportunity to ask questions and all were answered. The patient agreed with the plan and demonstrated an understanding of the instructions.  The patient was advised to call back or seek an in-person evaluation if the symptoms worsen or if the condition fails to improve as anticipated.  I provided 15 minutes of non face-to-face telephone visit time during this encounter, and > 50% was spent counseling as documented under my assessment & plan.    Truitt Merle, MD 06/01/2019   I, Joslyn Devon,  am acting as scribe for Truitt Merle, MD.   I have  reviewed the above documentation for accuracy and completeness, and I agree with the above.

## 2019-05-26 ENCOUNTER — Encounter: Payer: Self-pay | Admitting: *Deleted

## 2019-06-01 ENCOUNTER — Other Ambulatory Visit: Payer: Medicare Other

## 2019-06-01 ENCOUNTER — Inpatient Hospital Stay (HOSPITAL_BASED_OUTPATIENT_CLINIC_OR_DEPARTMENT_OTHER): Payer: Medicare Other | Admitting: Hematology

## 2019-06-01 DIAGNOSIS — C50411 Malignant neoplasm of upper-outer quadrant of right female breast: Secondary | ICD-10-CM | POA: Diagnosis not present

## 2019-06-01 DIAGNOSIS — Z170 Estrogen receptor positive status [ER+]: Secondary | ICD-10-CM

## 2019-06-02 ENCOUNTER — Encounter: Payer: Self-pay | Admitting: Hematology

## 2019-06-02 ENCOUNTER — Telehealth: Payer: Self-pay | Admitting: Hematology

## 2019-06-02 NOTE — Telephone Encounter (Signed)
Scheduled appt per 12/28 los.  Sent a message to the HIM pool to get a calendar mailed out.

## 2019-06-11 ENCOUNTER — Ambulatory Visit: Payer: Self-pay | Admitting: Rheumatology

## 2019-06-11 ENCOUNTER — Other Ambulatory Visit: Payer: Self-pay

## 2019-06-11 ENCOUNTER — Ambulatory Visit: Payer: Medicare PPO | Admitting: Internal Medicine

## 2019-06-11 ENCOUNTER — Encounter: Payer: Self-pay | Admitting: Internal Medicine

## 2019-06-11 VITALS — BP 124/62 | HR 60 | Temp 98.2°F | Ht 59.0 in | Wt 146.2 lb

## 2019-06-11 DIAGNOSIS — I129 Hypertensive chronic kidney disease with stage 1 through stage 4 chronic kidney disease, or unspecified chronic kidney disease: Secondary | ICD-10-CM | POA: Diagnosis not present

## 2019-06-11 DIAGNOSIS — L219 Seborrheic dermatitis, unspecified: Secondary | ICD-10-CM | POA: Diagnosis not present

## 2019-06-11 DIAGNOSIS — Z23 Encounter for immunization: Secondary | ICD-10-CM

## 2019-06-11 DIAGNOSIS — N182 Chronic kidney disease, stage 2 (mild): Secondary | ICD-10-CM

## 2019-06-11 DIAGNOSIS — E1122 Type 2 diabetes mellitus with diabetic chronic kidney disease: Secondary | ICD-10-CM | POA: Diagnosis not present

## 2019-06-11 DIAGNOSIS — E7800 Pure hypercholesterolemia, unspecified: Secondary | ICD-10-CM | POA: Diagnosis not present

## 2019-06-11 DIAGNOSIS — R002 Palpitations: Secondary | ICD-10-CM

## 2019-06-11 DIAGNOSIS — R413 Other amnesia: Secondary | ICD-10-CM

## 2019-06-11 MED ORDER — FLUOCINOLONE ACETONIDE SCALP 0.01 % EX OIL: TOPICAL_OIL | CUTANEOUS | 1 refills | Status: DC

## 2019-06-11 NOTE — Patient Instructions (Signed)

## 2019-06-12 LAB — CMP14+EGFR
ALT: 18 IU/L (ref 0–32)
AST: 23 IU/L (ref 0–40)
Albumin/Globulin Ratio: 1.6 (ref 1.2–2.2)
Albumin: 4.7 g/dL (ref 3.7–4.7)
Alkaline Phosphatase: 79 IU/L (ref 39–117)
BUN/Creatinine Ratio: 16 (ref 12–28)
BUN: 14 mg/dL (ref 8–27)
Bilirubin Total: 0.3 mg/dL (ref 0.0–1.2)
CO2: 23 mmol/L (ref 20–29)
Calcium: 9.9 mg/dL (ref 8.7–10.3)
Chloride: 103 mmol/L (ref 96–106)
Creatinine, Ser: 0.9 mg/dL (ref 0.57–1.00)
GFR calc Af Amer: 73 mL/min/{1.73_m2} (ref >59)
GFR calc non Af Amer: 63 mL/min/{1.73_m2} (ref >59)
Globulin, Total: 2.9 g/dL (ref 1.5–4.5)
Glucose: 96 mg/dL (ref 65–99)
Potassium: 3.7 mmol/L (ref 3.5–5.2)
Sodium: 142 mmol/L (ref 134–144)
Total Protein: 7.6 g/dL (ref 6.0–8.5)

## 2019-06-12 LAB — HEMOGLOBIN A1C
Est. average glucose Bld gHb Est-mCnc: 114 mg/dL
Hgb A1c MFr Bld: 5.6 % (ref 4.8–5.6)

## 2019-06-12 LAB — TSH: TSH: 1.24 u[IU]/mL (ref 0.450–4.500)

## 2019-06-12 LAB — VITAMIN B12: Vitamin B-12: >2000 pg/mL — ABNORMAL HIGH (ref 232–1245)

## 2019-06-12 LAB — MAGNESIUM: Magnesium: 2.1 mg/dL (ref 1.6–2.3)

## 2019-06-12 NOTE — Progress Notes (Signed)
This visit occurred during the SARS-CoV-2 public health emergency.  Safety protocols were in place, including screening questions prior to the visit, additional usage of staff PPE, and extensive cleaning of exam room while observing appropriate contact time as indicated for disinfecting solutions.  Subjective:     Patient ID: Mariah Burns , female    DOB: [birth year] , 74 y.o.   MRN: [id number]   Chief Complaint  Patient presents with  . Diabetes  . Hypertension  . Immunizations    pneumonia  . Hyperlipidemia    HPI  Diabetes She presents for her follow-up diabetic visit. She has type 2 diabetes mellitus. Her disease course has been stable. There are no hypoglycemic associated symptoms. Pertinent negatives for diabetes include no blurred vision. There are no hypoglycemic complications. Diabetic complications include nephropathy. Risk factors for coronary artery disease include diabetes mellitus, dyslipidemia, hypertension, sedentary lifestyle and post-menopausal. She is compliant with treatment most of the time.  Hypertension This is a chronic problem. The current episode started more than 1 year ago. The problem has been gradually improving since onset. The problem is controlled. Associated symptoms include palpitations. Pertinent negatives include no blurred vision.  Hyperlipidemia     Past Medical History:  Diagnosis Date  . Anxiety   . Cancer ([place]) 08/2018   right breast IDC  . Hyperlipidemia   . Hypertension   . OSA (obstructive sleep apnea)    NPSG 03/08/2010  AHI 19.8/hr, CPAP nightly  . Osteopenia   . Pre-diabetes   . 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 Allergies Sister   . Heart disease Sister   . Lupus Sister   . Allergies Mother   . Allergies Daughter   . Healthy Daughter   . Heart disease Sister    . Gout Brother   . Hypertension Brother   . Heart disease Brother   . Heart failure Father   . Gout Brother   . Hypertension Brother   . Hypertension Brother   . Hypertension Brother   . Post-traumatic stress disorder Brother   . Healthy Daughter      Current Outpatient Medications:  .  [place] Oil-Levomenthol (FDGARD PO), Take 2 tablets by mouth as needed (upset stomach)., Disp: , Rfl:  .  Cholecalciferol (VITAMIN D-3 PO), Take 2,000 Units by mouth daily., Disp: , Rfl:  .  felodipine (PLENDIL) 10 MG 24 hr tablet, TAKE 1 TABLET BY MOUTH EVERY DAY, Disp: 90 tablet, Rfl: 2 .  fish oil-omega-3 fatty acids 1000 MG capsule, Take 2 capsules by mouth daily.  , Disp: , Rfl:  .  GLUCOSAMINE-CHONDROITIN PO, Take 2 tablets by mouth daily.  , Disp: , Rfl:  .  L-Methylfolate-Algae-B12-B6 (METANX) 3-90.314-2-35 MG CAPS, Take 1 capsule by mouth 2 (two) times daily. , Disp: , Rfl:  .  Menthol, Topical Analgesic, (BIOFREEZE ROLL-ON EX), Apply 1 application topically as needed (pain in back,neck,thumb)., Disp: , Rfl:  .  Misc Natural Products (TART CHERRY ADVANCED PO), Take 2 tablets by mouth daily., Disp: , Rfl:  .  nitroGLYCERIN (NITROSTAT) 0.4 MG SL tablet, Place 1 tablet (0.4 mg total) under the tongue every 5 (five) minutes as needed for chest pain., Disp: 25 tablet, Rfl: 2 .  Polyvinyl Alcohol-Povidone PF (REFRESH) 1.4-0.6 % SOLN, Place 1 drop into both eyes daily  as needed (dry eyes). , Disp: , Rfl:  .  pravastatin (PRAVACHOL) 40 MG tablet, TAKE 1 TABLET BY MOUTH ON MONDAY, WEDNESDAY, AND FRIDAY, Disp: 90 tablet, Rfl: 1 .  Fluocinolone Acetonide Scalp 0.01 % OIL, Apply to affected area of scalp as directed 1-2 x/day as needed for itching, Disp: 118.28 mL, Rfl: 1 .  tamoxifen (NOLVADEX) 20 MG tablet, Take 1 tablet (20 mg total) by mouth daily. (Patient not taking: Reported on 06/11/2019), Disp: 90 tablet, Rfl: 3   Allergies  Allergen Reactions  . Fosamax [Alendronate Sodium] Other (See Comments)     REACTION: "CAUSE GI UPSET AND FATIGUE"  . Omeprazole Other (See Comments)    Lower ab pain  . Motrin [Ibuprofen] Other (See Comments)    GI upset- has to eat prior to taking  . Fluvastatin Other (See Comments)    mild memoryproblems of confusion      Review of Systems  Constitutional: Negative.   Eyes: Negative for blurred vision.  Respiratory: Negative.   Cardiovascular: Positive for palpitations.       She c/o palpitations. She had episode yesterday. Thinks it is related to new medication, tamoxifen. No associated chest pain or shortness of breath. Sx occurred at rest.   Gastrointestinal: Negative.   Neurological: Negative.        She c/o having memory issues. She feels that her focus has decreased. She is not sure if her sx are related to medication.   Psychiatric/Behavioral: Negative.      Today's Vitals   06/11/19 1155 06/11/19 1247  BP: (!) 152/70 124/62  Pulse: 60   Temp: 98.2 F (36.8 C)   TempSrc: Oral   Weight: 146 lb 3.2 oz (66.3 kg)   Height: 4' 11"  (1.499 m)   PainSc: 0-No pain    Body mass index is 29.53 kg/m.   Objective:  Physical Exam Vitals and nursing note reviewed.  Constitutional:      Appearance: Normal appearance.  HENT:     Head: Normocephalic and atraumatic.  Cardiovascular:     Rate and Rhythm: Normal rate and regular rhythm.     Heart sounds: Normal heart sounds.  Pulmonary:     Effort: Pulmonary effort is normal.     Breath sounds: Normal breath sounds.  Skin:    General: Skin is warm.  Neurological:     General: No focal deficit present.     Mental Status: She is alert.  Psychiatric:        Mood and Affect: Mood normal.        Behavior: Behavior normal.         Assessment And Plan:     1. Type 2 diabetes mellitus with stage 2 chronic kidney disease, without long-term current use of insulin (HCC)  Chronic, I will check labs as listed below. She is encouraged to stay well hydrated. I will make further recommendations once her  labs are available for review.   - CMP14+EGFR - Hemoglobin A1c  2. Parenchymal renal hypertension, stage 1 through stage 4 or unspecified chronic kidney disease  Chronic, well controlled.  She will continue with current meds. She is encouraged to avoid adding salt to her foods.   3. Pure hypercholesterolemia  Chronic.  LDL is 112 from July 2020. Pt advised that her goal LDL is less than 70. However, she is unable to tolerate daily statin use. She wishes to continue with MWF dosing of pravastatin. Will consider increasing dose at her next visit.  4. Seborrheic dermatitis of scalp  Chronic. She was given rx Dermasmooth to apply to scalp daily as needed. This was initially prescribed by her dermatologist.   5. Palpitations  New onset. She is asymptomatic today. Pt advised that I do not think her sx are related to tamoxifen. I will check labs as listed below. She is also encouraged to stay well hydrated. She is encouraged to let me know if her sx persist.   - TSH - Magnesium  6. Memory changes  I will check for metabolic causes today. I will also check RPR. I will make further recommendations once her labs are available for review.   - Vitamin B12  7. Immunization due  She was given Pneumovax-23 IM x 1 to update immunization history.   - Pneumococcal polysaccharide vaccine 23-valent (for > 30 year old)        [name], MD    THE PATIENT IS ENCOURAGED TO PRACTICE SOCIAL DISTANCING DUE TO THE COVID-19 PANDEMIC.

## 2019-06-16 LAB — SPECIMEN STATUS REPORT

## 2019-06-16 LAB — RPR QUALITATIVE: RPR Ser Ql: NONREACTIVE

## 2019-06-22 ENCOUNTER — Telehealth: Payer: Self-pay

## 2019-06-22 NOTE — Telephone Encounter (Signed)
Got it, no problem with me. Thanks for the message.   Truitt Merle MD

## 2019-06-22 NOTE — Telephone Encounter (Signed)
Butch Penny with Dr. Lorie Apley office called stating that the patient had a colonoscopy in 2018 and adenomas were removed.  She is not due until 2023 and does not qualify for the Cologuard DNA stool test because of her history of tubular adenomas, no family history and no symptoms at this time.  Forwarded to Dr. Burr Medico.

## 2019-07-14 DIAGNOSIS — D2372 Other benign neoplasm of skin of left lower limb, including hip: Secondary | ICD-10-CM | POA: Diagnosis not present

## 2019-07-14 DIAGNOSIS — E1351 Other specified diabetes mellitus with diabetic peripheral angiopathy without gangrene: Secondary | ICD-10-CM | POA: Diagnosis not present

## 2019-07-14 DIAGNOSIS — L602 Onychogryphosis: Secondary | ICD-10-CM | POA: Diagnosis not present

## 2019-07-14 DIAGNOSIS — G603 Idiopathic progressive neuropathy: Secondary | ICD-10-CM | POA: Diagnosis not present

## 2019-08-03 ENCOUNTER — Telehealth: Payer: Self-pay

## 2019-08-03 NOTE — Telephone Encounter (Signed)
The pt was asked if it's ok to get the covid vaccination with her health conditions and the pt was told yes.

## 2019-08-06 ENCOUNTER — Encounter: Payer: Self-pay | Admitting: Hematology

## 2019-08-06 DIAGNOSIS — C50511 Malignant neoplasm of lower-outer quadrant of right female breast: Secondary | ICD-10-CM | POA: Diagnosis not present

## 2019-08-06 NOTE — Progress Notes (Signed)
Mariah Burns   [phone] [phone]   Clinic Follow up Note   Patient Care Team: Mariah Chard, MD as PCP - General (Internal Medicine) Mariah Crome, MD as PCP - Cardiology (Cardiology) Mariah Kaufmann, RN as Oncology Nurse Navigator Mariah Germany, RN as Oncology Nurse Navigator Mariah Seltzer, MD (Inactive) as Consulting Physician (General Surgery) Mariah Merle, MD as Consulting Physician (Hematology) Mariah Pray, MD as Consulting Physician (Radiation Oncology) Mariah Feeling, NP as Nurse Practitioner (Nurse Practitioner)  Date of Service:  08/12/2019  CHIEF COMPLAINT: F/u of right breast cancer  SUMMARY OF ONCOLOGIC HISTORY: Oncology History Overview Note  Cancer Staging Malignant neoplasm of upper-outer quadrant of right breast in female, estrogen receptor positive ([place]) Staging form: Breast, AJCC 8th Edition - Clinical stage from 08/04/2018: Stage IA (cT1c, cN0, cM0, G2, ER+, PR+, HER2-) - Signed by Mariah Merle, MD on 08/12/2018 - Pathologic stage from 09/02/2018: Stage IA (pT2, pN1a, cM0, G1, ER+, PR+, HER2-) - Signed by Mariah Merle, MD on 09/29/2018     Malignant neoplasm of upper-outer quadrant of right breast in female, estrogen receptor positive ([place])  07/29/2018 Mammogram   Diagnostic Mammgram 07/29/18  IMPRESSION:  The 1.7cm oval mass in the right breast upper outer qudrant middle third, 3-5 from nipple is suspicious of malignancy.    08/04/2018 Cancer Staging   Staging form: Breast, AJCC 8th Edition - Clinical stage from 08/04/2018: Stage IA (cT1c, cN0, cM0, G2, ER+, PR+, HER2-) - Signed by Mariah Merle, MD on 08/12/2018   08/04/2018 Initial Biopsy   Diagnosis 08/04/18  Breast, right, needle core biopsy, 1.7 cm irregular solid mass at 9 o'clock, 4 cm fn - INVASIVE DUCTAL CARCINOMA WITH EXTRACELLULAR MUCIN.   08/04/2018 Receptors her2   Results: IMMUNOHISTOCHEMICAL AND MORPHOMETRIC ANALYSIS PERFORMED MANUALLY The tumor cells are NEGATIVE  for Her2 (0). Estrogen Receptor: 100%, POSITIVE, STRONG STAINING INTENSITY Progesterone Receptor: 100%, POSITIVE, STRONG STAINING INTENSITY Proliferation Marker Ki67: 10%   08/11/2018 Initial Diagnosis   Malignant neoplasm of upper-outer quadrant of right breast in female, estrogen receptor positive ([place])   09/02/2018 Surgery   RIGHT BREAST LUMPECTOMY WITH RADIOACTIVE SEED AND  RIGHT SENTINEL LYMPH NODE BIOPSY by Dr. Excell Burns  09/02/18    09/02/2018 Pathology Results   Diagnosis 1. Breast, lumpectomy, Right w/seed - INVASIVE DUCTAL CARCINOMA WITH EXTRACELLULAR MUCIN, GRADE I, 2.1 CM. - DUCTAL CARCINOMA IN SITU, INTERMEDIATE NUCLEAR GRADE. - INVASIVE CARCINOMA IS 2 MM FROM SUPERIOR MARGIN AND 2.5 MM FROM LATERAL MARGIN. - DUCTAL CARCINOMA IN SITU IS LESS THAN 1 MM FROM ANTERIOR MARGIN, 4 MM FROM SUPERIOR AND LATERAL MARGINS, 5 MM FROM INFERIOR MARGIN AND 8 MM FROM THE POSTERIOR MARGIN. - SMALL INTRADUCTAL PAPILLOMA. - NO EVIDENCE OF LYMPHOVASCULAR OR PERINEURAL INVASION. - BIOPSY SITE CHANGES. - SEE ONCOLOGY TABLE. 2. Lymph node, sentinel, biopsy, Right Axillary - LYMPH NODE, NEGATIVE FOR CARCINOMA (0/1). 3. Lymph node, sentinel, biopsy, Right - LYMPH NODE, NEGATIVE FOR CARCINOMA (0/1). 4. Lymph node, sentinel, biopsy, Right - METASTATIC BREAST CARCINOMA TO ONE LYMPH NODE (1/1). - METASTATIC FOCUS MEASURES 0.4 CM IN GREATEST DIMENSION. - NO DEFINITE EVIDENCE OF EXTRANODAL EXTENSION.   09/02/2018 Miscellaneous   MammaPrint  09/02/18 Low risk with 10% average 10-year risk of recurrnce untreated  Mammaprint Index: +0.287 97.8% of patients treated with hormonal therapy have no distnat recurrance.    09/02/2018 Cancer Staging   Staging form: Breast, AJCC 8th Edition - Pathologic stage from 09/02/2018: Stage IA (pT2, pN1a, cM0, G1, ER+,  PR+, HER2-) - Signed by Mariah Merle, MD on 09/29/2018   10/05/2018 - 01/2019 Anti-estrogen oral therapy   Anastrozole 31m daily starting 10/05/18; stopped 5/18  due to insomnia, hot flashes, depression, and vaginal irritation.  I switched her Exemestane in 01/2019. She started having dizziness, hot flashes and joint pain so she stopped two days later.  -She tried for no more than 3 weeks total   11/12/2018 - 12/30/2018 Radiation Therapy   RT 11/12/18-12/30/18 with Dr. KEarney Burns   05/14/2019 Survivorship   SCP per LCira Rue NP    06/2019 - 06/2019 Anti-estrogen oral therapy   Tamoxifen 226mdaily starting 06/2019 but stopped after a few days due to dizziness, nausea and fatigue.       CURRENT THERAPY:  Surveillance   INTERVAL HISTORY:  FaAYERIM Burns here for a follow up of right breast cancer. She presents to the clinic alone.  She notes she tried Tamoxifen but experienced dizziness, fatigue, nausea and so she stopped it after a few days. She notes she is open to better eating and exercise. She notes she last saw her PCP 2-3 months ago. She notes she has had a right breast mass since her surgery which has not changed. [facility]     REVIEW OF SYSTEMS:   Constitutional: Denies fevers, chills or abnormal weight loss Eyes: Denies blurriness of vision Ears, nose, mouth, throat, and face: Denies mucositis or sore throat Respiratory: Denies cough, dyspnea or wheezes Cardiovascular: Denies palpitation, chest discomfort or lower extremity swelling Gastrointestinal:  Denies nausea, heartburn or change in bowel habits Skin: Denies abnormal skin rashes Lymphatics: Denies new lymphadenopathy or easy bruising Neurological:Denies numbness, tingling or new weaknesses Behavioral/Psych: Mood is stable, no new changes  Breast: (+) right breast mass, stable  All other systems were reviewed with the patient and are negative.  MEDICAL HISTORY:  Past Medical History:  Diagnosis Date  . Anxiety   . [place]   right breast IDC  . Hyperlipidemia   . Hypertension   . OSA (obstructive sleep apnea)     NPSG 03/08/2010  AHI 19.8/hr, CPAP nightly  . Osteopenia   . Pre-diabetes   . 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 BREAST LUMPECTOMY WITH RADIOACTIVE SEED AND SENTINEL LYMPH NODE BIOPSY Right 09/02/2018   Procedure: RIGHT BREAST LUMPECTOMY WITH RADIOACTIVE SEED AND  RIGHT SENTINEL LYMPH NODE BIOPSY;  Surgeon: HoExcell SeltzerMD;  [facility] Service: [facility];  Laterality: Right;  . BRONCHOSCOPY     dx'd sarcoid  . KNEE ARTHROPLASTY Left 03/13/2017  . NEUROPLASTY / TRANSPOSITION ULNAR NERVE AT ELBOW  09/2011  . TOTAL ABDOMINAL HYSTERECTOMY      I have reviewed the social history and family history with the patient and they are unchanged from previous note.  ALLERGIES:  is allergic to fosamax [alendronate sodium]; omeprazole; motrin [ibuprofen]; and fluvastatin.  MEDICATIONS:  Current Outpatient Medications  Medication Sig Dispense Refill  . Cholecalciferol (VITAMIN D-3 PO) Take 2,000 Units by mouth daily.    . felodipine (PLENDIL) 10 MG 24 hr tablet TAKE 1 TABLET BY MOUTH EVERY DAY 90 tablet 2  . fish oil-omega-3 fatty acids 1000 MG capsule Take 2 capsules by mouth daily.      .Marland Kitchen  Fluocinolone Acetonide Scalp 0.01 % OIL Apply to affected area of scalp as directed 1-2 x/day as needed for itching 118.28 mL 1  . GLUCOSAMINE-CHONDROITIN PO Take 2 tablets by mouth daily.      Marland Kitchen L-Methylfolate-Algae-B12-B6 (METANX) 3-90.314-2-35 MG CAPS Take 1 capsule by mouth 2 (two) times daily.     . Menthol, Topical Analgesic, (BIOFREEZE ROLL-ON EX) Apply 1 application topically as needed (pain in back,neck,thumb).    . Misc Natural Products (TART CHERRY ADVANCED PO) Take 2 tablets by mouth daily.    . nitroGLYCERIN (NITROSTAT) 0.4 MG SL tablet Place 1 tablet (0.4 mg total) under the tongue every 5 (five)  minutes as needed for chest pain. 25 tablet 2  . Polyvinyl Alcohol-Povidone PF (REFRESH) 1.4-0.6 % SOLN Place 1 drop into both eyes daily as needed (dry eyes).     . pravastatin (PRAVACHOL) 40 MG tablet TAKE 1 TABLET BY MOUTH ON MONDAY, WEDNESDAY, AND FRIDAY 90 tablet 1   No current facility-administered medications for this visit.    PHYSICAL EXAMINATION: ECOG PERFORMANCE STATUS: 0 - Asymptomatic  Vitals:   08/12/19 1110  BP: (!) 166/58  Pulse: 65  Resp: 18  Temp: 98.2 F (36.8 C)  SpO2: 100%   Filed Weights   08/12/19 1110  Weight: 146 lb 9.6 oz (66.5 kg)    GENERAL:alert, no distress and comfortable SKIN: skin color, texture, turgor are normal, no rashes or significant lesions EYES: normal, Conjunctiva are pink and non-injected, sclera clear  NECK: supple, thyroid normal size, non-tender, without nodularity LYMPH:  no palpable lymphadenopathy in the cervical, axillary  LUNGS: clear to auscultation and percussion with normal breathing effort HEART: regular rate & rhythm and no murmurs and no lower extremity edema ABDOMEN:abdomen soft, non-tender and normal bowel sounds Musculoskeletal:no cyanosis of digits and no clubbing  NEURO: alert & oriented x 3 with fluent speech, no focal motor/sensory deficits BREAST: S/p right lumpectomy: surgical incision healed well. (+) Skin hyperpigmentation from RT (+) 2cm mass at 7-8:00 position of right breast, 3 cm from nipple (stable). Left Breast exam benign.   LABORATORY DATA:  I have reviewed the data as listed CBC Latest Ref Rng & Units 08/12/2019 11/04/2018 08/13/2018  WBC 4.0 - 10.5 K/uL 3.6(L) 4.4 4.6  Hemoglobin 12.0 - 15.0 g/dL 11.8(L) 11.7(L) 12.0  Hematocrit 36.0 - 46.0 % 35.4(L) 36.2 37.4  Platelets 150 - 400 K/uL 168 194 204     CMP Latest Ref Rng & Units 08/12/2019 06/11/2019 11/04/2018  Glucose 70 - 99 mg/dL 90 96 92  BUN 8 - 23 mg/dL _0 Creatinine 0.44 - 1.00 mg/dL 0.94 0.90 0.96  Sodium 135 - 145 mmol/L 144 142 142    Potassium 3.5 - 5.1 mmol/L 3.5 3.7 3.5  Chloride 98 - 111 mmol/L 107 103 106  CO2 22 - 32 mmol/L _1 Calcium 8.9 - 10.3 mg/dL 9.5 9.9 9.8  Total Protein 6.5 - 8.1 g/dL 7.7 7.6 7.8  Total Bilirubin 0.3 - 1.2 mg/dL 0.4 0.3 0.4  Alkaline Phos 38 - 126 U/L 66 79 73  AST 15 - 41 U/L _2 ALT 0 - 44 U/L _3 RADIOGRAPHIC STUDIES: I have personally reviewed the radiological images as listed and agreed with the findings in the report. No results found.   ASSESSMENT & PLAN:  Mariah Burns is a 74 y.o. female with    1. Malignant neoplasm of upper-outer quadrant of right  breast, Stage IA,pT2N1aM0, ER/PR+, HER2-, Grade I, Mammaprint low risk -She was diagnosed in 08/2018. She is s/p right lumpectomy with SLNB on 3/31/20and is s/p adjuvant Radiation therapy. -Her mammaprint showed low risk of distantrecurrencewithMammaprint index of 0.287.Adjuvant chemo was not recommended.  -Given her ER/PR+ disease,she has tried Anastrozole, Exemestane and most recently Tamoxifen over 3-4 months total. She did not tolerate any of them due toinsomnia, hot flashes, increased depression, vaginal itching/discharge/odor, dizziness, nausea and joint pain.  She is fine to continue surveillance given her low risk of recurrence.  -I discussed overall healthy lifestyle with healthy balanced diet, adequate exercise 30-40 minutes a day and positive mental health can improve her overall health to better combat cancer recurrence. She is agreeable.  -She is clinically doing well. Lab reviewed, her CBC and CMP are within normal limits except WBC 3.6, hg 11.8. She does have right breast mass which has been present since surgery, stable on exam. There is no clinical concern for recurrence. -She notes her 08/2019 Mammogram at [facility] was normal. I reviewed the report, no mammogram evidence in LOQ of right breast, I will order a breast US  -We also discussed the breast cancer surveillance after her  surgery. She will continue annual screening mammogram, self exam, and a routine office visit with lab and exam with [place]. -Phone visit in 6 months and OV in 1 year with NP Lacie. I encouraged her to watch for concerning symptoms and contact me with any questions.   2.Osteopenia -12/2018 DEXA shows osteopenia with lowest T-score -1.3 at left femur neck.She has a estimated fracture risk is 4.5% in the next 10 and 0.7% risk of hip fracture in the next 10 years.  -I encouraged her to continue 2000 unit Vitamin D3 and start oral calcium. I also recommend weight bearing exercise such as walking and some weight lifting. -She is no longer on AI due to poor tolerance.   3. Genetic Testing  -She is not very aware of her family history of cancer.  -Given she has 3 daughters, she will consider genetic testing even if out of pocket.   4. HTN and DM -overall controlled, continue to f/u with PCP -Continue medications  5. Right upper shoulder and right neck pain -Likely muscular and positional related. Ongoing since her RT 12/2018.  -Has been improving. Not mentioned today, likely resolved.   6. Cancer Screening -Her last colonoscopy was in 2018 and benign adenomas were removed. Will repeat in 2023.    PLAN: -Obtained 08/2019 Mammogram report from [place], I reviewed, the palpable LOQ right breast mass was not mentioned in report, I will order [place] of right breast for further evaluation   -Phone visit in 6 months  -Lab and F/u in 1 year with NP Lacie    No problem-specific Assessment & Plan notes found for this encounter.   Orders Placed This Encounter  Procedures  . US BREAST LTD UNI RIGHT INC AXILLA    Standing Status:   Future    Standing Expiration Date:   10/11/2020    Scheduling Instructions:     [facility]    Order Specific Question:   Reason for Exam (SYMPTOM  OR DIAGNOSIS REQUIRED)    Answer:   evaluate the 2cm palpable mass at 8:00 position 2-3cm fn, it has been there since surgery      Order Specific Question:   Preferred imaging location?    Answer:   External   All questions were answered. The patient knows to call  the clinic with any problems, questions or concerns. No barriers to learning was detected.      Mariah Merle, MD 08/12/2019   I, Joslyn Devon, am acting as scribe for Mariah Merle, MD.   I have reviewed the above documentation for accuracy and completeness, and I agree with the above.

## 2019-08-09 ENCOUNTER — Ambulatory Visit: Payer: Medicare PPO | Attending: Internal Medicine

## 2019-08-09 DIAGNOSIS — Z23 Encounter for immunization: Secondary | ICD-10-CM

## 2019-08-09 NOTE — Progress Notes (Signed)
   Covid-19 Vaccination Clinic  Name:  Mariah Burns    MRN: [id number] DOB: 1946-01-18  08/09/2019  [name] was observed post Covid-19 immunization for 15 minutes without incident. She was provided with Vaccine Information Sheet and instruction to access the V-Safe system.   [name] to call 911 with any severe reactions post vaccine: Marland Kitchen Difficulty breathing  . Swelling of face and throat  . A fast heartbeat  . A bad rash all over body  . Dizziness and weakness   Immunizations Administered    Name Date Dose VIS Date Route   Pfizer COVID-19 Vaccine 08/09/2019  3:23 PM 0.3 mL 05/15/2019 Intramuscular   Manufacturer: [place]   Lot: [id number]   [place]: [id number]

## 2019-08-10 NOTE — Progress Notes (Signed)
Office Visit Note  Patient: Mariah Burns             Date of Birth: 23-Feb-1946           MRN: [id number]             PCP: Glendale Chard, MD Referring: Glendale Chard, MD Visit Date: 08/18/2019 Occupation: @GUAROCC @  Subjective:  Pain in multiple joints   History of Present Illness: Mariah Burns is a 74 y.o. female with history of pulmonary sarcoidosis and osteoarthritis.  She denies any sarcoidosis flares since her last visit. She has not seen Dr. Annamaria Boots recently.  She has ongoing pain in both hands, both knee joints, and both feet.   She denies any joint swelling. She has chronic lower back pain.  She denies any new or worsening pulmonary symptoms.  She has an upcoming appointment with Dr. Annamaria Boots in June 2021.  Activities of Daily Living:  Patient reports morning stiffness for 0 minutes.   Patient Reports nocturnal pain.  Difficulty dressing/grooming: Denies Difficulty climbing stairs: Denies Difficulty getting out of chair: Reports Difficulty using hands for taps, buttons, cutlery, and/or writing: Reports  Review of Systems  Constitutional: Positive for fatigue.  HENT: Negative for mouth sores, mouth dryness and nose dryness.   Eyes: Positive for dryness.  Respiratory: Negative for shortness of breath and difficulty breathing.   Cardiovascular: Negative for chest pain and palpitations.  Gastrointestinal: Negative for blood in stool, constipation and diarrhea.  Endocrine: Negative for increased urination.  Genitourinary: Negative for difficulty urinating and painful urination.  Musculoskeletal: Positive for arthralgias, joint pain and joint swelling. Negative for morning stiffness.  Skin: Negative for rash and redness.  Allergic/Immunologic: Negative for susceptible to infections.  Neurological: Positive for dizziness and weakness. Negative for numbness, headaches and memory loss.  Hematological: Negative for swollen glands.  Psychiatric/Behavioral: Positive for  sleep disturbance. Negative for confusion.    PMFS History:  Patient Active Problem List   Diagnosis Date Noted  . Malignant neoplasm of upper-outer quadrant of right breast in female, estrogen receptor positive ([place]) 08/11/2018  . Chronic renal disease, stage II 05/15/2018  . Hypertensive nephropathy 05/15/2018  . Adult BMI 29.0-29.9 kg/sq m 05/15/2018  . Dyspnea on exertion 02/17/2015  . Incomplete right bundle branch block 02/17/2015  . Systolic murmur 0000000  . Pericardial effusion 02/17/2015  . Osteopenia 10/05/2011  . DM (diabetes mellitus), type 2 with complications ([place]) [id number]  . SARCOIDOSIS 03/21/2007  . HYPERLIPIDEMIA 03/21/2007  . Obstructive sleep apnea 03/21/2007  . Essential hypertension 03/21/2007    Past Medical History:  Diagnosis Date  . Anxiety   . Cancer ([place]) 08/2018   right breast IDC  . Hyperlipidemia   . Hypertension   . OSA (obstructive sleep apnea)    NPSG 03/08/2010  AHI 19.8/hr, CPAP nightly  . Osteopenia   . Pre-diabetes   . 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 Allergies Sister   . Heart disease Sister   . Lupus Sister   . Allergies Mother   . Allergies Daughter   . Healthy Daughter   . Heart disease Sister   . Gout Brother   . Hypertension Brother   . Heart disease Brother   . Heart failure Father   . Gout Brother   . Hypertension Brother   .  Hypertension Brother   . Hypertension Brother   . Post-traumatic stress disorder Brother   . Healthy Daughter    Past Surgical History:  Procedure Laterality Date  . BREAST LUMPECTOMY WITH RADIOACTIVE SEED AND SENTINEL LYMPH NODE BIOPSY Right 09/02/2018   Procedure: RIGHT BREAST LUMPECTOMY WITH RADIOACTIVE SEED AND  RIGHT SENTINEL LYMPH NODE BIOPSY;  Surgeon: Excell Seltzer, MD;  Location: [facility];  Service:  General;  Laterality: Right;  . BRONCHOSCOPY     dx'd sarcoid  . KNEE ARTHROPLASTY Left 03/13/2017  . NEUROPLASTY / TRANSPOSITION ULNAR NERVE AT ELBOW  09/2011  . TOTAL ABDOMINAL HYSTERECTOMY     Social History   Social History Narrative   Patient lives at home with daughter.   Caffeine Use: none   Immunization History  Administered Date(s) Administered  . Fluad Quad(high Dose 65+) 02/12/2019  . Influenza Split 04/10/2011, 04/15/2012, 03/04/2013, 03/04/2014, 03/05/2015, 03/04/2017  . Influenza Whole 06/13/2010  . Influenza,inj,Quad PF,6+ Mos 03/22/2016  . Influenza-Unspecified 03/22/2016, 03/04/2018  . PFIZER SARS-COV-2 Vaccination 08/09/2019  . Pneumococcal Conjugate-13 11/13/2017  . Pneumococcal Polysaccharide-23 06/04/2009, 12/28/2010, 06/11/2019  . Td 05/31/2008  . Tdap 12/13/2018     Objective: Vital Signs: BP (!) 160/69 (BP Location: Left Arm, Patient Position: Sitting, Cuff Size: Normal)   Pulse (!) 58   Resp 14   Ht 4\' 11"  (1.499 m)   Wt 146 lb (66.2 kg)   BMI 29.49 kg/m    Physical Exam Vitals and nursing note reviewed.  Constitutional:      Appearance: She is well-developed.  HENT:     Head: Normocephalic and atraumatic.  Eyes:     Conjunctiva/sclera: Conjunctivae normal.  Cardiovascular:     Heart sounds: Normal heart sounds.  Pulmonary:     Effort: Pulmonary effort is normal.     Breath sounds: Normal breath sounds.  Abdominal:     General: Bowel sounds are normal.     Palpations: Abdomen is soft.  Musculoskeletal:     Cervical back: Normal range of motion.  Lymphadenopathy:     Cervical: No cervical adenopathy.  Skin:    General: Skin is warm and dry.     Capillary Refill: Capillary refill takes less than 2 seconds.  Neurological:     Mental Status: She is alert and oriented to person, place, and time.  Psychiatric:        Behavior: Behavior normal.      Musculoskeletal Exam: C-spine, thoracic spine, and lumbar spine good ROM.  No midline  spinal tenderness.  Shoulder joints, elbow joints, wrist joints, MCPs, PIPs, and DIPs good ROM with no synovitis.  Mild PIP and DIP thickening consistent with osteoarthritis of both hands. Complete fist formation bilaterally.  Hip joints, knee joints, ankle joints good ROM with no inflammation.  No warmth or effusion of knee joints noted.  No tenderness or inflammation of ankle joints.   CDAI Exam: CDAI Score: -- Patient Global: --; Provider Global: -- Swollen: --; Tender: -- Joint Exam 08/18/2019   No joint exam has been documented for this visit   There is currently no information documented on the homunculus. Go to the Rheumatology activity and complete the homunculus joint exam.  Investigation: No additional findings.  Imaging: No results found.  Recent Labs: Lab Results  Component Value Date   WBC 3.6 (L) 08/12/2019   HGB 11.8 (L) 08/12/2019   PLT 168 08/12/2019   NA 144 08/12/2019   K 3.5 08/12/2019   CL 107 08/12/2019  CO2 29 08/12/2019   GLUCOSE 90 08/12/2019   BUN 12 08/12/2019   CREATININE 0.94 08/12/2019   BILITOT 0.4 08/12/2019   ALKPHOS 66 08/12/2019   AST 20 08/12/2019   ALT 15 08/12/2019   PROT 7.7 08/12/2019   ALBUMIN 3.9 08/12/2019   CALCIUM 9.5 08/12/2019   GFRAA >60 08/12/2019    Speciality Comments: No specialty comments available.  Procedures:  No procedures performed Allergies: Fosamax [alendronate sodium], Omeprazole, Motrin [ibuprofen], and Fluvastatin  .       Assessment / Plan:     Visit Diagnoses: Pulmonary sarcoidosis ([place]) - +RF, +ANA: She has not had any new or worsening pulmonary symptoms.  She is not taking any immunosuppressive agents. She has an upcoming appointment with Dr. Annamaria Boots on 11/26/19. She will follow up in our office in 1 year.   Primary osteoarthritis of both hands: She has mild PIP and DIP thickening consistent with osteoarthritis of both hands.  No tenderness or synovitis noted.  Complete fist formation bilaterally.   Joint protection and muscle strengthening were dicussed.  We discussed natural antiinflammatories to start taking.  She was advised to notify us if she develops increased joint pain or joint swelling.    Primary osteoarthritis of both knees: She has good ROM of both knee joints.  No warmth or effusion of knee joints.  She has no difficulty climbing steps but has some discomfort when getting up from a chair.  We discussed the importance of lower extremity muscle strengthening.  She is planning on restarting water aerobics once she has received her second covid-19 vaccination.    DDD (degenerative disc disease), cervical: She has good ROM of the C-spine.  No symptoms of radiculopathy at this time.    Carpal tunnel syndrome of right wrist: She experiences intermittent paresthesias in the right hand.    Lateral epicondylitis of both elbows - Resolved.   Osteopenia of multiple sites - Managed by Dr. Baird Cancer.  She takes a calcium and vitamin D supplement.   Other medical conditions are listed as follows:   History of sleep apnea - Uses CPAP machine  History of hyperlipidemia  History of vitamin D deficiency  History of diabetes mellitus  History of hypertension  Orders: No orders of the defined types were placed in this encounter.  No orders of the defined types were placed in this encounter.    Follow-Up Instructions: Return in about 1 year (around 08/17/2020) for Pulmonary sarcoidosis , Osteoarthritis.   Ofilia Neas, PA-C   I examined and evaluated the patient with Hazel Sams PA.  Patient had no synovitis on my examination.  She continues to have some discomfort from underlying osteoarthritis.  Joint protection muscle strengthening was discussed at length.  She had no flares of pulmonary sarcoidosis.  She has been followed by Dr. Annamaria Boots.  The plan of care was discussed as noted above.  Bo Merino, MD  Note - This record has been created using Editor, commissioning.  Chart  creation errors have been sought, but may not always  have been located. Such creation errors do not reflect on  the standard of medical care.

## 2019-08-11 DIAGNOSIS — G4733 Obstructive sleep apnea (adult) (pediatric): Secondary | ICD-10-CM | POA: Diagnosis not present

## 2019-08-12 ENCOUNTER — Other Ambulatory Visit: Payer: Self-pay | Admitting: Internal Medicine

## 2019-08-12 ENCOUNTER — Encounter: Payer: Self-pay | Admitting: Hematology

## 2019-08-12 ENCOUNTER — Other Ambulatory Visit: Payer: Self-pay

## 2019-08-12 ENCOUNTER — Inpatient Hospital Stay (HOSPITAL_BASED_OUTPATIENT_CLINIC_OR_DEPARTMENT_OTHER): Payer: Medicare PPO | Admitting: Hematology

## 2019-08-12 ENCOUNTER — Inpatient Hospital Stay: Payer: Medicare PPO | Attending: Hematology

## 2019-08-12 VITALS — BP 166/58 | HR 65 | Temp 98.2°F | Resp 18 | Ht 59.0 in | Wt 146.6 lb

## 2019-08-12 DIAGNOSIS — Z170 Estrogen receptor positive status [ER+]: Secondary | ICD-10-CM | POA: Diagnosis not present

## 2019-08-12 DIAGNOSIS — Z79899 Other long term (current) drug therapy: Secondary | ICD-10-CM | POA: Insufficient documentation

## 2019-08-12 DIAGNOSIS — G4733 Obstructive sleep apnea (adult) (pediatric): Secondary | ICD-10-CM | POA: Insufficient documentation

## 2019-08-12 DIAGNOSIS — F329 Major depressive disorder, single episode, unspecified: Secondary | ICD-10-CM | POA: Diagnosis not present

## 2019-08-12 DIAGNOSIS — Z923 Personal history of irradiation: Secondary | ICD-10-CM | POA: Diagnosis not present

## 2019-08-12 DIAGNOSIS — E785 Hyperlipidemia, unspecified: Secondary | ICD-10-CM | POA: Insufficient documentation

## 2019-08-12 DIAGNOSIS — M8580 Other specified disorders of bone density and structure, unspecified site: Secondary | ICD-10-CM | POA: Diagnosis not present

## 2019-08-12 DIAGNOSIS — C50411 Malignant neoplasm of upper-outer quadrant of right female breast: Secondary | ICD-10-CM

## 2019-08-12 DIAGNOSIS — I10 Essential (primary) hypertension: Secondary | ICD-10-CM | POA: Diagnosis not present

## 2019-08-12 DIAGNOSIS — E119 Type 2 diabetes mellitus without complications: Secondary | ICD-10-CM | POA: Diagnosis not present

## 2019-08-12 DIAGNOSIS — G4700 Insomnia, unspecified: Secondary | ICD-10-CM | POA: Insufficient documentation

## 2019-08-12 LAB — CBC WITH DIFFERENTIAL (CANCER CENTER ONLY)
Abs Immature Granulocytes: 0 10*3/uL (ref 0.00–0.07)
Basophils Absolute: 0 10*3/uL (ref 0.0–0.1)
Basophils Relative: 1 %
Eosinophils Absolute: 0.1 10*3/uL (ref 0.0–0.5)
Eosinophils Relative: 3 %
HCT: 35.4 % — ABNORMAL LOW (ref 36.0–46.0)
Hemoglobin: 11.8 g/dL — ABNORMAL LOW (ref 12.0–15.0)
Immature Granulocytes: 0 %
Lymphocytes Relative: 23 %
Lymphs Abs: 0.8 10*3/uL (ref 0.7–4.0)
MCH: 29.5 pg (ref 26.0–34.0)
MCHC: 33.3 g/dL (ref 30.0–36.0)
MCV: 88.5 fL (ref 80.0–100.0)
Monocytes Absolute: 0.5 10*3/uL (ref 0.1–1.0)
Monocytes Relative: 15 %
Neutro Abs: 2.1 10*3/uL (ref 1.7–7.7)
Neutrophils Relative %: 58 %
Platelet Count: 168 10*3/uL (ref 150–400)
RBC: 4 MIL/uL (ref 3.87–5.11)
RDW: 13.6 % (ref 11.5–15.5)
WBC Count: 3.6 10*3/uL — ABNORMAL LOW (ref 4.0–10.5)
nRBC: 0 % (ref 0.0–0.2)

## 2019-08-12 LAB — CMP (CANCER CENTER ONLY)
ALT: 15 U/L (ref 0–44)
AST: 20 U/L (ref 15–41)
Albumin: 3.9 g/dL (ref 3.5–5.0)
Alkaline Phosphatase: 66 U/L (ref 38–126)
Anion gap: 8 (ref 5–15)
BUN: 12 mg/dL (ref 8–23)
CO2: 29 mmol/L (ref 22–32)
Calcium: 9.5 mg/dL (ref 8.9–10.3)
Chloride: 107 mmol/L (ref 98–111)
Creatinine: 0.94 mg/dL (ref 0.44–1.00)
GFR, Est AFR Am: >60 mL/min (ref >60)
GFR, Estimated: 60 mL/min — ABNORMAL LOW (ref >60)
Glucose, Bld: 90 mg/dL (ref 70–99)
Potassium: 3.5 mmol/L (ref 3.5–5.1)
Sodium: 144 mmol/L (ref 135–145)
Total Bilirubin: 0.4 mg/dL (ref 0.3–1.2)
Total Protein: 7.7 g/dL (ref 6.5–8.1)

## 2019-08-13 ENCOUNTER — Telehealth: Payer: Self-pay | Admitting: Hematology

## 2019-08-13 NOTE — Telephone Encounter (Signed)
Scheduled appt per 3/10 los.  Spoke with pt and she is aware of the appt date and time,

## 2019-08-17 ENCOUNTER — Telehealth: Payer: Self-pay

## 2019-08-17 NOTE — Telephone Encounter (Signed)
Orders for right breast US faxed to [facility]

## 2019-08-18 ENCOUNTER — Ambulatory Visit: Payer: Medicare Other | Admitting: Rheumatology

## 2019-08-18 ENCOUNTER — Other Ambulatory Visit: Payer: Self-pay

## 2019-08-18 ENCOUNTER — Encounter: Payer: Self-pay | Admitting: Rheumatology

## 2019-08-18 ENCOUNTER — Ambulatory Visit: Payer: Medicare PPO | Admitting: Rheumatology

## 2019-08-18 VITALS — BP 160/69 | HR 58 | Resp 14 | Ht 59.0 in | Wt 146.0 lb

## 2019-08-18 DIAGNOSIS — D860 Sarcoidosis of lung: Secondary | ICD-10-CM

## 2019-08-18 DIAGNOSIS — M5030 Other cervical disc degeneration, unspecified cervical region: Secondary | ICD-10-CM | POA: Diagnosis not present

## 2019-08-18 DIAGNOSIS — M170 Bilateral primary osteoarthritis of knee: Secondary | ICD-10-CM

## 2019-08-18 DIAGNOSIS — M7711 Lateral epicondylitis, right elbow: Secondary | ICD-10-CM

## 2019-08-18 DIAGNOSIS — M19042 Primary osteoarthritis, left hand: Secondary | ICD-10-CM

## 2019-08-18 DIAGNOSIS — Z8639 Personal history of other endocrine, nutritional and metabolic disease: Secondary | ICD-10-CM | POA: Diagnosis not present

## 2019-08-18 DIAGNOSIS — G5601 Carpal tunnel syndrome, right upper limb: Secondary | ICD-10-CM | POA: Diagnosis not present

## 2019-08-18 DIAGNOSIS — M8589 Other specified disorders of bone density and structure, multiple sites: Secondary | ICD-10-CM | POA: Diagnosis not present

## 2019-08-18 DIAGNOSIS — M19041 Primary osteoarthritis, right hand: Secondary | ICD-10-CM

## 2019-08-18 DIAGNOSIS — Z8669 Personal history of other diseases of the nervous system and sense organs: Secondary | ICD-10-CM

## 2019-08-18 DIAGNOSIS — Z8679 Personal history of other diseases of the circulatory system: Secondary | ICD-10-CM

## 2019-08-18 DIAGNOSIS — M7712 Lateral epicondylitis, left elbow: Secondary | ICD-10-CM

## 2019-08-18 NOTE — Patient Instructions (Signed)
Journal for Nurse Practitioners, 15(4), 263-267. Retrieved March 10, 2018 from [email or web address].[email or web address]">  Knee Exercises Ask your health care provider which exercises are safe for you. Do exercises exactly as told by your health care provider and adjust them as directed. It is normal to feel mild stretching, pulling, tightness, or discomfort as you do these exercises. Stop right away if you feel sudden pain or your pain gets worse. Do not begin these exercises until told by your health care provider. Stretching and range-of-motion exercises These exercises warm up your muscles and joints and improve the movement and flexibility of your knee. These exercises also help to relieve pain and swelling. Knee extension, prone 1. Lie on your abdomen (prone position) on a bed. 2. Place your left / right knee just beyond the edge of the surface so your knee is not on the bed. You can put a towel under your left / right thigh just above your kneecap for comfort. 3. Relax your leg muscles and allow gravity to straighten your knee (extension). You should feel a stretch behind your left / right knee. 4. Hold this position for __________ seconds. 5. Scoot up so your knee is supported between repetitions. Repeat __________ times. Complete this exercise __________ times a day. Knee flexion, active  1. Lie on your back with both legs straight. If this causes back discomfort, bend your left / right knee so your foot is flat on the floor. 2. Slowly slide your left / right heel back toward your buttocks. Stop when you feel a gentle stretch in the front of your knee or thigh (flexion). 3. Hold this position for __________ seconds. 4. Slowly slide your left / right heel back to the starting position. Repeat __________ times. Complete this exercise __________ times a day. Quadriceps stretch, prone  1. Lie on your abdomen on a firm surface, such as a bed or padded floor. 2. Bend your left / right knee and hold  your ankle. If you cannot reach your ankle or pant leg, loop a belt around your foot and grab the belt instead. 3. Gently pull your heel toward your buttocks. Your knee should not slide out to the side. You should feel a stretch in the front of your thigh and knee (quadriceps). 4. Hold this position for __________ seconds. Repeat __________ times. Complete this exercise __________ times a day. Hamstring, supine 1. Lie on your back (supine position). 2. Loop a belt or towel over the ball of your left / right foot. The ball of your foot is on the walking surface, right under your toes. 3. Straighten your left / right knee and slowly pull on the belt to raise your leg until you feel a gentle stretch behind your knee (hamstring). ? Do not let your knee bend while you do this. ? Keep your other leg flat on the floor. 4. Hold this position for __________ seconds. Repeat __________ times. Complete this exercise __________ times a day. Strengthening exercises These exercises build strength and endurance in your knee. Endurance is the ability to use your muscles for a long time, even after they get tired. Quadriceps, isometric This exercise stretches the muscles in front of your thigh (quadriceps) without moving your knee joint (isometric). 1. Lie on your back with your left / right leg extended and your other knee bent. Put a rolled towel or small pillow under your knee if told by your health care provider. 2. Slowly tense the muscles in the front of your left /   right thigh. You should see your kneecap slide up toward your hip or see increased dimpling just above the knee. This motion will push the back of the knee toward the floor. 3. For __________ seconds, hold the muscle as tight as you can without increasing your pain. 4. Relax the muscles slowly and completely. Repeat __________ times. Complete this exercise __________ times a day. Straight leg raises This exercise stretches the muscles in front  of your thigh (quadriceps) and the muscles that move your hips (hip flexors). 1. Lie on your back with your left / right leg extended and your other knee bent. 2. Tense the muscles in the front of your left / right thigh. You should see your kneecap slide up or see increased dimpling just above the knee. Your thigh may even shake a bit. 3. Keep these muscles tight as you raise your leg 4-6 inches (10-15 cm) off the floor. Do not let your knee bend. 4. Hold this position for __________ seconds. 5. Keep these muscles tense as you lower your leg. 6. Relax your muscles slowly and completely after each repetition. Repeat __________ times. Complete this exercise __________ times a day. Hamstring, isometric 1. Lie on your back on a firm surface. 2. Bend your left / right knee about __________ degrees. 3. Dig your left / right heel into the surface as if you are trying to pull it toward your buttocks. Tighten the muscles in the back of your thighs (hamstring) to "dig" as hard as you can without increasing any pain. 4. Hold this position for __________ seconds. 5. Release the tension gradually and allow your muscles to relax completely for __________ seconds after each repetition. Repeat __________ times. Complete this exercise __________ times a day. Hamstring curls If told by your health care provider, do this exercise while wearing ankle weights. Begin with __________ lb weights. Then increase the weight by 1 lb (0.5 kg) increments. Do not wear ankle weights that are more than __________ lb. 1. Lie on your abdomen with your legs straight. 2. Bend your left / right knee as far as you can without feeling pain. Keep your hips flat against the floor. 3. Hold this position for __________ seconds. 4. Slowly lower your leg to the starting position. Repeat __________ times. Complete this exercise __________ times a day. Squats This exercise strengthens the muscles in front of your thigh and knee  (quadriceps). 1. Stand in front of a table, with your feet and knees pointing straight ahead. You may rest your hands on the table for balance but not for support. 2. Slowly bend your knees and lower your hips like you are going to sit in a chair. ? Keep your weight over your heels, not over your toes. ? Keep your lower legs upright so they are parallel with the table legs. ? Do not let your hips go lower than your knees. ? Do not bend lower than told by your health care provider. ? If your knee pain increases, do not bend as low. 3. Hold the squat position for __________ seconds. 4. Slowly push with your legs to return to standing. Do not use your hands to pull yourself to standing. Repeat __________ times. Complete this exercise __________ times a day. Wall slides This exercise strengthens the muscles in front of your thigh and knee (quadriceps). 1. Lean your back against a smooth wall or door, and walk your feet out 18-24 inches (46-61 cm) from it. 2. Place your feet hip-width apart. 3.   Slowly slide down the wall or door until your knees bend __________ degrees. Keep your knees over your heels, not over your toes. Keep your knees in line with your hips. 4. Hold this position for __________ seconds. Repeat __________ times. Complete this exercise __________ times a day. Straight leg raises This exercise strengthens the muscles that rotate the leg at the hip and move it away from your body (hip abductors). 1. Lie on your side with your left / right leg in the top position. Lie so your head, shoulder, knee, and hip line up. You may bend your bottom knee to help you keep your balance. 2. Roll your hips slightly forward so your hips are stacked directly over each other and your left / right knee is facing forward. 3. Leading with your heel, lift your top leg 4-6 inches (10-15 cm). You should feel the muscles in your outer hip lifting. ? Do not let your foot drift forward. ? Do not let your knee  roll toward the ceiling. 4. Hold this position for __________ seconds. 5. Slowly return your leg to the starting position. 6. Let your muscles relax completely after each repetition. Repeat __________ times. Complete this exercise __________ times a day. Straight leg raises This exercise stretches the muscles that move your hips away from the front of the pelvis (hip extensors). 1. Lie on your abdomen on a firm surface. You can put a pillow under your hips if that is more comfortable. 2. Tense the muscles in your buttocks and lift your left / right leg about 4-6 inches (10-15 cm). Keep your knee straight as you lift your leg. 3. Hold this position for __________ seconds. 4. Slowly lower your leg to the starting position. 5. Let your leg relax completely after each repetition. Repeat __________ times. Complete this exercise __________ times a day. This information is not intended to replace advice given to you by your health care provider. Make sure you discuss any questions you have with your health care provider. Document Revised: 03/11/2018 Document Reviewed: 03/11/2018 Elsevier Patient Education  2020 [employer] Hand Exercises Hand exercises can be helpful for almost anyone. These exercises can strengthen the hands, improve flexibility and movement, and increase blood flow to the hands. These results can make work and daily tasks easier. Hand exercises can be especially helpful for people who have joint pain from arthritis or have nerve damage from overuse (carpal tunnel syndrome). These exercises can also help people who have injured a hand. Exercises Most of these hand exercises are gentle stretching and motion exercises. It is usually safe to do them often throughout the day. Warming up your hands before exercise may help to reduce stiffness. You can do this with gentle massage or by placing your hands in warm water for 10-15 minutes. It is normal to feel some stretching, pulling,  tightness, or mild discomfort as you begin new exercises. This will gradually improve. Stop an exercise right away if you feel sudden, severe pain or your pain gets worse. Ask your health care provider which exercises are best for you. Knuckle bend or "claw" fist 1. Stand or sit with your arm, hand, and all five fingers pointed straight up. Make sure to keep your wrist straight during the exercise. 2. Gently bend your fingers down toward your palm until the tips of your fingers are touching the top of your palm. Keep your big knuckle straight and just bend the small knuckles in your fingers. 3. Hold this position for   __________ seconds. 4. Straighten (extend) your fingers back to the starting position. Repeat this exercise 5-10 times with each hand. Full finger fist 1. Stand or sit with your arm, hand, and all five fingers pointed straight up. Make sure to keep your wrist straight during the exercise. 2. Gently bend your fingers into your palm until the tips of your fingers are touching the middle of your palm. 3. Hold this position for __________ seconds. 4. Extend your fingers back to the starting position, stretching every joint fully. Repeat this exercise 5-10 times with each hand. Straight fist 1. Stand or sit with your arm, hand, and all five fingers pointed straight up. Make sure to keep your wrist straight during the exercise. 2. Gently bend your fingers at the big knuckle, where your fingers meet your hand, and the middle knuckle. Keep the knuckle at the tips of your fingers straight and try to touch the bottom of your palm. 3. Hold this position for __________ seconds. 4. Extend your fingers back to the starting position, stretching every joint fully. Repeat this exercise 5-10 times with each hand. Tabletop 1. Stand or sit with your arm, hand, and all five fingers pointed straight up. Make sure to keep your wrist straight during the exercise. 2. Gently bend your fingers at the big  knuckle, where your fingers meet your hand, as far down as you can while keeping the small knuckles in your fingers straight. Think of forming a tabletop with your fingers. 3. Hold this position for __________ seconds. 4. Extend your fingers back to the starting position, stretching every joint fully. Repeat this exercise 5-10 times with each hand. Finger spread 1. Place your hand flat on a table with your palm facing down. Make sure your wrist stays straight as you do this exercise. 2. Spread your fingers and thumb apart from each other as far as you can until you feel a gentle stretch. Hold this position for __________ seconds. 3. Bring your fingers and thumb tight together again. Hold this position for __________ seconds. Repeat this exercise 5-10 times with each hand. Making circles 1. Stand or sit with your arm, hand, and all five fingers pointed straight up. Make sure to keep your wrist straight during the exercise. 2. Make a circle by touching the tip of your thumb to the tip of your index finger. 3. Hold for __________ seconds. Then open your hand wide. 4. Repeat this motion with your thumb and each finger on your hand. Repeat this exercise 5-10 times with each hand. Thumb motion 1. Sit with your forearm resting on a table and your wrist straight. Your thumb should be facing up toward the ceiling. Keep your fingers relaxed as you move your thumb. 2. Lift your thumb up as high as you can toward the ceiling. Hold for __________ seconds. 3. Bend your thumb across your palm as far as you can, reaching the tip of your thumb for the small finger (pinkie) side of your palm. Hold for __________ seconds. Repeat this exercise 5-10 times with each hand. Grip strengthening  1. Hold a stress ball or other soft ball in the middle of your hand. 2. Slowly increase the pressure, squeezing the ball as much as you can without causing pain. Think of bringing the tips of your fingers into the middle of  your palm. All of your finger joints should bend when doing this exercise. 3. Hold your squeeze for __________ seconds, then relax. Repeat this exercise 5-10 times with each   hand. Contact a health care provider if:  Your hand pain or discomfort gets much worse when you do an exercise.  Your hand pain or discomfort does not improve within 2 hours after you exercise. If you have any of these problems, stop doing these exercises right away. Do not do them again unless your health care provider says that you can. Get help right away if:  You develop sudden, severe hand pain or swelling. If this happens, stop doing these exercises right away. Do not do them again unless your health care provider says that you can. This information is not intended to replace advice given to you by your health care provider. Make sure you discuss any questions you have with your health care provider. Document Revised: 09/11/2018 Document Reviewed: 05/22/2018 Elsevier Patient Education  2020 [employer] Inc.  

## 2019-09-08 ENCOUNTER — Ambulatory Visit: Payer: Medicare PPO | Attending: Internal Medicine

## 2019-09-08 DIAGNOSIS — Z23 Encounter for immunization: Secondary | ICD-10-CM

## 2019-09-08 NOTE — Progress Notes (Signed)
   Covid-19 Vaccination Clinic  Name:  Mariah Burns    MRN: [id number] DOB: [birth year]  09/08/2019  Mariah Burns was observed post Covid-19 immunization for 15 minutes without incident. She was provided with Vaccine Information Sheet and instruction to access the V-Safe system.   Mariah Burns was instructed to call 911 with any severe reactions post vaccine: Marland Kitchen Difficulty breathing  . Swelling of face and throat  . A fast heartbeat  . A bad rash all over body  . Dizziness and weakness   Immunizations Administered    Name Date Dose VIS Date Route   Pfizer COVID-19 Vaccine 09/08/2019  1:17 PM 0.3 mL 05/15/2019 Intramuscular   Manufacturer: [place]   Lot: [id number]   [place]: [id number]

## 2019-09-11 DIAGNOSIS — Z170 Estrogen receptor positive status [ER+]: Secondary | ICD-10-CM | POA: Diagnosis not present

## 2019-09-11 DIAGNOSIS — C50911 Malignant neoplasm of unspecified site of right female breast: Secondary | ICD-10-CM | POA: Diagnosis not present

## 2019-09-14 DIAGNOSIS — L602 Onychogryphosis: Secondary | ICD-10-CM | POA: Diagnosis not present

## 2019-09-14 DIAGNOSIS — E1351 Other specified diabetes mellitus with diabetic peripheral angiopathy without gangrene: Secondary | ICD-10-CM | POA: Diagnosis not present

## 2019-09-14 DIAGNOSIS — G603 Idiopathic progressive neuropathy: Secondary | ICD-10-CM | POA: Diagnosis not present

## 2019-09-24 ENCOUNTER — Other Ambulatory Visit: Payer: Self-pay | Admitting: Internal Medicine

## 2019-10-28 ENCOUNTER — Encounter: Payer: Self-pay | Admitting: Internal Medicine

## 2019-10-28 ENCOUNTER — Ambulatory Visit (INDEPENDENT_AMBULATORY_CARE_PROVIDER_SITE_OTHER): Payer: Medicare PPO

## 2019-10-28 ENCOUNTER — Other Ambulatory Visit: Payer: Self-pay

## 2019-10-28 ENCOUNTER — Ambulatory Visit: Payer: Medicare PPO | Admitting: Internal Medicine

## 2019-10-28 VITALS — BP 136/64 | HR 59 | Temp 98.0°F | Ht 58.8 in | Wt 148.8 lb

## 2019-10-28 VITALS — BP 136/64 | HR 59 | Temp 98.7°F | Ht 58.8 in | Wt 148.0 lb

## 2019-10-28 DIAGNOSIS — C773 Secondary and unspecified malignant neoplasm of axilla and upper limb lymph nodes: Secondary | ICD-10-CM | POA: Diagnosis not present

## 2019-10-28 DIAGNOSIS — M545 Low back pain, unspecified: Secondary | ICD-10-CM | POA: Insufficient documentation

## 2019-10-28 DIAGNOSIS — Z0000 Encounter for general adult medical examination without abnormal findings: Secondary | ICD-10-CM | POA: Diagnosis not present

## 2019-10-28 DIAGNOSIS — M25551 Pain in right hip: Secondary | ICD-10-CM

## 2019-10-28 DIAGNOSIS — M25552 Pain in left hip: Secondary | ICD-10-CM

## 2019-10-28 DIAGNOSIS — E118 Type 2 diabetes mellitus with unspecified complications: Secondary | ICD-10-CM

## 2019-10-28 DIAGNOSIS — E559 Vitamin D deficiency, unspecified: Secondary | ICD-10-CM

## 2019-10-28 DIAGNOSIS — E1122 Type 2 diabetes mellitus with diabetic chronic kidney disease: Secondary | ICD-10-CM | POA: Diagnosis not present

## 2019-10-28 DIAGNOSIS — E6609 Other obesity due to excess calories: Secondary | ICD-10-CM

## 2019-10-28 DIAGNOSIS — E66811 Obesity, class 1: Secondary | ICD-10-CM | POA: Insufficient documentation

## 2019-10-28 DIAGNOSIS — I129 Hypertensive chronic kidney disease with stage 1 through stage 4 chronic kidney disease, or unspecified chronic kidney disease: Secondary | ICD-10-CM | POA: Diagnosis not present

## 2019-10-28 DIAGNOSIS — N182 Chronic kidney disease, stage 2 (mild): Secondary | ICD-10-CM | POA: Diagnosis not present

## 2019-10-28 DIAGNOSIS — Z1211 Encounter for screening for malignant neoplasm of colon: Secondary | ICD-10-CM

## 2019-10-28 DIAGNOSIS — G8929 Other chronic pain: Secondary | ICD-10-CM

## 2019-10-28 HISTORY — DX: Other chronic pain: G89.29

## 2019-10-28 HISTORY — DX: Other obesity due to excess calories: E66.09

## 2019-10-28 HISTORY — DX: Low back pain, unspecified: M54.50

## 2019-10-28 HISTORY — DX: Pain in right hip: M25.551

## 2019-10-28 HISTORY — DX: Secondary and unspecified malignant neoplasm of axilla and upper limb lymph nodes: C77.3

## 2019-10-28 HISTORY — DX: Vitamin D deficiency, unspecified: E55.9

## 2019-10-28 LAB — POCT UA - MICROALBUMIN
Albumin/Creatinine Ratio, Urine, POC: <30
Creatinine, POC: 300 mg/dL
Microalbumin Ur, POC: 30 mg/L

## 2019-10-28 LAB — POCT URINALYSIS DIPSTICK
Bilirubin, UA: NEGATIVE
Blood, UA: NEGATIVE
Glucose, UA: NEGATIVE
Ketones, UA: NEGATIVE
Nitrite, UA: NEGATIVE
Protein, UA: POSITIVE — AB
Spec Grav, UA: 1.025 (ref 1.010–1.025)
Urobilinogen, UA: 0.2 E.U./dL
pH, UA: 6 (ref 5.0–8.0)

## 2019-10-28 NOTE — Progress Notes (Signed)
This visit occurred during the SARS-CoV-2 public health emergency.  Safety protocols were in place, including screening questions prior to the visit, additional usage of staff PPE, and extensive cleaning of exam room while observing appropriate contact time as indicated for disinfecting solutions.  Subjective:     Patient ID: Mariah Burns , female    DOB: 05-04-1946 , 74 y.o.   MRN: [id number]   Chief Complaint  Patient presents with  . Diabetes  . Hypertension    HPI  Diabetes She presents for her follow-up diabetic visit. She has type 2 diabetes mellitus. Her disease course has been stable. There are no hypoglycemic associated symptoms. Pertinent negatives for diabetes include no blurred vision. There are no hypoglycemic complications. Diabetic complications include nephropathy. Risk factors for coronary artery disease include diabetes mellitus, dyslipidemia, hypertension, sedentary lifestyle and post-menopausal. She is compliant with treatment most of the time.  Hypertension This is a chronic problem. The current episode started more than 1 year ago. The problem has been gradually improving since onset. The problem is controlled. Pertinent negatives include no blurred vision.     Past Medical History:  Diagnosis Date  . Anxiety   . 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 Hyperlipidemia   . Hypertension   . OSA (obstructive sleep apnea)    NPSG 03/08/2010  AHI 19.8/hr, CPAP nightly  . Osteopenia   . Pre-diabetes   . 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 Allergies Sister   . Heart disease Sister   . Lupus Sister   . Allergies Mother   . Allergies Daughter   . Healthy Daughter   . Heart disease Sister   . Gout Brother   . Hypertension Brother   . Heart disease Brother   . Heart failure Father   . Gout Brother    . Hypertension Brother   . Hypertension Brother   . Hypertension Brother   . Post-traumatic stress disorder Brother   . Healthy Daughter      Current Outpatient Medications:  .  Cholecalciferol (VITAMIN D-3 PO), Take 2,000 Units by mouth daily., Disp: , Rfl:  .  felodipine (PLENDIL) 10 MG 24 hr tablet, TAKE 1 TABLET BY MOUTH EVERY DAY, Disp: 90 tablet, Rfl: 2 .  fish oil-omega-3 fatty acids 1000 MG capsule, Take 2 capsules by mouth daily.  , Disp: , Rfl:  .  Fluocinolone Acetonide Scalp 0.01 % OIL, Apply to affected area of scalp as directed 1-2 x/day as needed for itching, Disp: 118.28 mL, Rfl: 1 .  GLUCOSAMINE-CHONDROITIN PO, Take 2 tablets by mouth daily.  , Disp: , Rfl:  .  L-Methylfolate-Algae-B12-B6 (METANX) 3-90.314-2-35 MG CAPS, Take 1 capsule by mouth 2 (two) times daily. , Disp: , Rfl:  .  Menthol, Topical Analgesic, (BIOFREEZE ROLL-ON EX), Apply 1 application topically as needed (pain in back,neck,thumb)., Disp: , Rfl:  .  mometasone (NASONEX) 50 MCG/ACT nasal spray, Place 2 sprays into the nose daily. As needed, Disp: , Rfl:  .  nitroGLYCERIN (NITROSTAT) 0.4 MG SL tablet, Place 1 tablet (0.4 mg total) under the tongue every 5 (five) minutes as needed for chest pain., Disp: 25 tablet, Rfl: 2 .  OVER THE COUNTER MEDICATION, as needed., Disp: , Rfl:  .  Polyvinyl Alcohol-Povidone PF (REFRESH) 1.4-0.6 % SOLN, Place  1 drop into both eyes daily as needed (dry eyes). , Disp: , Rfl:  .  pravastatin (PRAVACHOL) 40 MG tablet, TAKE 1 TABLET BY MOUTH ON MONDAY, WEDNESDAY, AND FRIDAY, Disp: 90 tablet, Rfl: 1   Allergies  Allergen Reactions  . Fosamax [Alendronate Sodium] Other (See Comments)    REACTION: "CAUSE GI UPSET AND FATIGUE"  . Omeprazole Other (See Comments)    Lower ab pain  . Motrin [Ibuprofen] Other (See Comments)    GI upset- has to eat prior to taking  . Fluvastatin Other (See Comments)    mild memoryproblems of confusion      Review of Systems  Constitutional:  Negative.   Eyes: Negative for blurred vision.  Respiratory: Negative.   Cardiovascular: Negative.   Gastrointestinal: Negative.   Musculoskeletal: Positive for arthralgias and back pain.       She c/o b/l hip pain and back pain. Denies fall/trauma. Sx started several weeks ago. Has difficulty lying on sides. There is some pain with ambulation. Feels stiff upon awakening.   Neurological: Negative.   Psychiatric/Behavioral: Negative.      Today's Vitals   10/28/19 1100  BP: 136/64  Pulse: (!) 59  Temp: 98.7 F (37.1 C)  TempSrc: Oral  Weight: 148 lb (67.1 kg)  Height: 4' 10.8" (1.494 m)  PainSc: 3    Body mass index is 30.1 kg/m.   Objective:  Physical Exam Vitals and nursing note reviewed.  Constitutional:      Appearance: Normal appearance. She is obese.  HENT:     Head: Normocephalic and atraumatic.  Cardiovascular:     Rate and Rhythm: Normal rate and regular rhythm.     Heart sounds: Normal heart sounds.  Pulmonary:     Effort: Pulmonary effort is normal.     Breath sounds: Normal breath sounds.  Musculoskeletal:     Comments: B/l hips tender to palpation  Skin:    General: Skin is warm.  Neurological:     General: No focal deficit present.     Mental Status: She is alert.  Psychiatric:        Mood and Affect: Mood normal.        Behavior: Behavior normal.         Assessment And Plan:     1. Type 2 diabetes mellitus with stage 2 chronic kidney disease, without long-term current use of insulin (HCC)  Chronic,  I will check labs as listed below. Importance of regular exercise was discussed with the patient. She was commended on her regular walking regimen.   - Hemoglobin A1c  2. Parenchymal renal hypertension, stage 1 through stage 4 or unspecified chronic kidney disease  Chronic, fair control. She will continue with current meds. She is reminded optimal BP is less than 130/80. She is encouraged to avoid adding salt to her foods.   3. Hip pain,  bilateral  Chronic. She agrees to PT referral, she has been satisfied with her service at [facility] in the past.  - Ambulatory referral to Physical Therapy  4. Chronic bilateral low back pain without sciatica  Chronic. Please see #3.   - Ambulatory referral to Physical Therapy  5. Vitamin D deficiency disease  I WILL CHECK A VIT D LEVEL AND SUPPLEMENT AS NEEDED.  ALSO ENCOURAGED TO SPEND 15 MINUTES IN THE SUN DAILY.  - Vitamin D (25 hydroxy)  6. Class 1 obesity due to excess calories with serious comorbidity and body mass index (BMI) of 30.0 to 30.9 in  adult  She is encouraged to lose 5-7 pounds to decrease cardiac risk. She admits that she needs to stop eating out so much.   7. 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   [name], MD    THE PATIENT IS ENCOURAGED TO PRACTICE SOCIAL DISTANCING DUE TO THE COVID-19 PANDEMIC.

## 2019-10-28 NOTE — Patient Instructions (Signed)
Mariah Burns , Thank you for taking time to come for your Medicare Wellness Visit. I appreciate your ongoing commitment to your health goals. Please review the following plan we discussed and let me know if I can assist you in the future.   Screening recommendations/referrals: Colonoscopy: referred Mammogram: 08/2019 Bone Density: 12/2018 Recommended yearly ophthalmology/optometry visit for glaucoma screening and checkup Recommended yearly dental visit for hygiene and checkup  Vaccinations: Influenza vaccine: 02/2019 Pneumococcal vaccine: 06/2019 Tdap vaccine: 12/2018 Shingles vaccine: discussed    Advanced directives: Please bring a copy of your POA (Power of [place]) and/or Living Will to your next appointment.   Conditions/risks identified: obesity  Next appointment: 12/24/2019 at 10:30   Preventive Care 65 Years and Older, Female Preventive care refers to lifestyle choices and visits with your health care provider that can promote health and wellness. What does preventive care include?  A yearly physical exam. This is also called an annual well check.  Dental exams once or twice a year.  Routine eye exams. Ask your health care provider how often you should have your eyes checked.  Personal lifestyle choices, including:  Daily care of your teeth and gums.  Regular physical activity.  Eating a healthy diet.  Avoiding tobacco and drug use.  Limiting alcohol use.  Practicing safe sex.  Taking low-dose aspirin every day.  Taking vitamin and mineral supplements as recommended by your health care provider. What happens during an annual well check? The services and screenings done by your health care provider during your annual well check will depend on your age, overall health, lifestyle risk factors, and family history of disease. Counseling  Your health care provider may ask you questions about your:  Alcohol use.  Tobacco use.  Drug use.  Emotional  well-being.  Home and relationship well-being.  Sexual activity.  Eating habits.  History of falls.  Memory and ability to understand (cognition).  Work and work Statistician.  Reproductive health. Screening  You may have the following tests or measurements:  Height, weight, and BMI.  Blood pressure.  Lipid and cholesterol levels. These may be checked every 5 years, or more frequently if you are over 78 years old.  Skin check.  Lung cancer screening. You may have this screening every year starting at age 57 if you have a 30-pack-year history of smoking and currently smoke or have quit within the past 15 years.  Fecal occult blood test (FOBT) of the stool. You may have this test every year starting at age 38.  Flexible sigmoidoscopy or colonoscopy. You may have a sigmoidoscopy every 5 years or a colonoscopy every 10 years starting at age 9.  Hepatitis C blood test.  Hepatitis B blood test.  Sexually transmitted disease (STD) testing.  Diabetes screening. This is done by checking your blood sugar (glucose) after you have not eaten for a while (fasting). You may have this done every 1-3 years.  Bone density scan. This is done to screen for osteoporosis. You may have this done starting at age 28.  Mammogram. This may be done every 1-2 years. Talk to your health care provider about how often you should have regular mammograms. Talk with your health care provider about your test results, treatment options, and if necessary, the need for more tests. Vaccines  Your health care provider may recommend certain vaccines, such as:  Influenza vaccine. This is recommended every year.  Tetanus, diphtheria, and acellular pertussis (Tdap, Td) vaccine. You may need a Td booster every  10 years.  Zoster vaccine. You may need this after age 50.  Pneumococcal 13-valent conjugate (PCV13) vaccine. One dose is recommended after age 75.  Pneumococcal polysaccharide (PPSV23) vaccine. One  dose is recommended after age 72. Talk to your health care provider about which screenings and vaccines you need and how often you need them. This information is not intended to replace advice given to you by your health care provider. Make sure you discuss any questions you have with your health care provider. Document Released: 06/17/2015 Document Revised: 02/08/2016 Document Reviewed: 03/22/2015 Elsevier Interactive Patient Education  2017 [place] Prevention in the Home Falls can cause injuries. They can happen to people of all ages. There are many things you can do to make your home safe and to help prevent falls. What can I do on the outside of my home?  Regularly fix the edges of walkways and driveways and fix any cracks.  Remove anything that might make you trip as you walk through a door, such as a raised step or threshold.  Trim any bushes or trees on the path to your home.  Use bright outdoor lighting.  Clear any walking paths of anything that might make someone trip, such as rocks or tools.  Regularly check to see if handrails are loose or broken. Make sure that both sides of any steps have handrails.  Any raised decks and porches should have guardrails on the edges.  Have any leaves, snow, or ice cleared regularly.  Use sand or salt on walking paths during winter.  Clean up any spills in your garage right away. This includes oil or grease spills. What can I do in the bathroom?  Use night lights.  Install grab bars by the toilet and in the tub and shower. Do not use towel bars as grab bars.  Use non-skid mats or decals in the tub or shower.  If you need to sit down in the shower, use a plastic, non-slip stool.  Keep the floor dry. Clean up any water that spills on the floor as soon as it happens.  Remove soap buildup in the tub or shower regularly.  Attach bath mats securely with double-sided non-slip rug tape.  Do not have throw rugs and other  things on the floor that can make you trip. What can I do in the bedroom?  Use night lights.  Make sure that you have a light by your bed that is easy to reach.  Do not use any sheets or blankets that are too big for your bed. They should not hang down onto the floor.  Have a firm chair that has side arms. You can use this for support while you get dressed.  Do not have throw rugs and other things on the floor that can make you trip. What can I do in the kitchen?  Clean up any spills right away.  Avoid walking on wet floors.  Keep items that you use a lot in easy-to-reach places.  If you need to reach something above you, use a strong step stool that has a grab bar.  Keep electrical cords out of the way.  Do not use floor polish or wax that makes floors slippery. If you must use wax, use non-skid floor wax.  Do not have throw rugs and other things on the floor that can make you trip. What can I do with my stairs?  Do not leave any items on the stairs.  Make sure  that there are handrails on both sides of the stairs and use them. Fix handrails that are broken or loose. Make sure that handrails are as long as the stairways.  Check any carpeting to make sure that it is firmly attached to the stairs. Fix any carpet that is loose or worn.  Avoid having throw rugs at the top or bottom of the stairs. If you do have throw rugs, attach them to the floor with carpet tape.  Make sure that you have a light switch at the top of the stairs and the bottom of the stairs. If you do not have them, ask someone to add them for you. What else can I do to help prevent falls?  Wear shoes that:  Do not have high heels.  Have rubber bottoms.  Are comfortable and fit you well.  Are closed at the toe. Do not wear sandals.  If you use a stepladder:  Make sure that it is fully opened. Do not climb a closed stepladder.  Make sure that both sides of the stepladder are locked into place.  Ask  someone to hold it for you, if possible.  Clearly mark and make sure that you can see:  Any grab bars or handrails.  First and last steps.  Where the edge of each step is.  Use tools that help you move around (mobility aids) if they are needed. These include:  Canes.  Walkers.  Scooters.  Crutches.  Turn on the lights when you go into a dark area. Replace any light bulbs as soon as they burn out.  Set up your furniture so you have a clear path. Avoid moving your furniture around.  If any of your floors are uneven, fix them.  If there are any pets around you, be aware of where they are.  Review your medicines with your doctor. Some medicines can make you feel dizzy. This can increase your chance of falling. Ask your doctor what other things that you can do to help prevent falls. This information is not intended to replace advice given to you by your health care provider. Make sure you discuss any questions you have with your health care provider. Document Released: 03/17/2009 Document Revised: 10/27/2015 Document Reviewed: 06/25/2014 Elsevier Interactive Patient Education  2017 [employer].

## 2019-10-28 NOTE — Progress Notes (Signed)
This visit occurred during the SARS-CoV-2 public health emergency.  Safety protocols were in place, including screening questions prior to the visit, additional usage of staff PPE, and extensive cleaning of exam room while observing appropriate contact time as indicated for disinfecting solutions. Subjective:   Mariah Burns is a 74 y.o. female who presents for Medicare Annual (Subsequent) preventive examination.  Review of Systems:  n/a Cardiac Risk Factors include: advanced age (>33men, >42 women);diabetes mellitus;hypertension;obesity (BMI >30kg/m2)     Objective:     Vitals: BP 136/64 (BP Location: Left Arm, Patient Position: Sitting, Cuff Size: Normal)   Pulse (!) 59   Temp 98 F (36.7 C) (Oral)   Ht 4' 10.8" (1.494 m)   Wt 148 lb 12.8 oz (67.5 kg)   SpO2 98%   BMI 30.26 kg/m   Body mass index is 30.26 kg/m.  Advanced Directives 10/28/2019 08/12/2019 02/02/2019 12/30/2018 10/23/2018 09/24/2018 09/02/2018  Does Patient Have a Medical Advance Directive? Yes Yes Yes Yes Yes No Yes  Type of Paramedic of [place];Living will - [place];Living will [place];Living will [place];Living will - [place]  Does patient want to make changes to medical advance directive? - - - No - Patient declined - - No - Patient declined  Copy of [place] in Chart? No - copy requested - - No - copy requested No - copy requested - No - copy requested    Tobacco Social History   Tobacco Use  Smoking Status Never Smoker  Smokeless Tobacco Never Used     Counseling given: Not Answered   Clinical Intake:  Pre-visit preparation completed: Yes  Pain : 0-10 Pain Score: 3  Pain Type: Chronic pain, Acute pain Pain Location: Shoulder Pain Orientation: Left Pain Descriptors / Indicators: Aching Pain Onset: In the past 7 days Pain Frequency: Intermittent Pain Relieving Factors:  biofreeze helps  Pain Relieving Factors: biofreeze helps  Nutritional Status: BMI > 30  Obese Nutritional Risks: None Diabetes: Yes  How often do you need to have someone help you when you read instructions, pamphlets, or other written materials from your doctor or pharmacy?: 1 - Never What is the last grade level you completed in school?: college  Interpreter Needed?: No  Information entered by :: NAllen LPN  Past Medical History:  Diagnosis Date  . Anxiety   . 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 Hyperlipidemia   . Hypertension   . OSA (obstructive sleep apnea)    NPSG 03/08/2010  AHI 19.8/hr, CPAP nightly  . Osteopenia   . Pre-diabetes   . 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 BREAST LUMPECTOMY WITH RADIOACTIVE SEED AND SENTINEL LYMPH NODE BIOPSY Right 09/02/2018   Procedure: RIGHT BREAST LUMPECTOMY WITH RADIOACTIVE SEED AND  RIGHT SENTINEL LYMPH NODE BIOPSY;  Surgeon: Excell Seltzer, MD;  Location: [facility];  Service: General;  Laterality: Right;  . BRONCHOSCOPY     dx'd sarcoid  . KNEE ARTHROPLASTY Left 03/13/2017  . NEUROPLASTY / TRANSPOSITION ULNAR NERVE AT ELBOW  09/2011  . TOTAL ABDOMINAL HYSTERECTOMY     Family History  Problem Relation Age of Onset  . Allergies Sister   . Heart disease Sister   . Lupus Sister   .  Allergies Mother   . Allergies Daughter   . Healthy Daughter   . Heart disease Sister   . Gout Brother   . Hypertension Brother   . Heart disease Brother   . Heart failure Father   . Gout Brother   . Hypertension Brother   . Hypertension Brother   . Hypertension Brother   . Post-traumatic stress disorder Brother   . Healthy Daughter    Social History   Socioeconomic History  . Marital status: Divorced    Spouse name: Not on file  . Number of children: 3  .  Years of education: College  . Highest education level: Not on file  Occupational History  . Occupation: Retired    Fish farm manager: RETIRED  Tobacco Use  . Smoking status: Never Smoker  . Smokeless tobacco: Never Used  Substance and Sexual Activity  . Alcohol use: No  . Drug use: No  . Sexual activity: Not Currently  Other Topics Concern  . Not on file  Social History Narrative   Patient lives at home with daughter.   Caffeine Use: none   Social Determinants of Health   Financial Resource Strain: Low Risk   . Difficulty of Paying Living Expenses: Not hard at all  Food Insecurity: No Food Insecurity  . Worried About Charity fundraiser in the Last Year: Never true  . Ran Out of Food in the Last Year: Never true  Transportation Needs: No Transportation Needs  . Lack of Transportation (Medical): No  . Lack of Transportation (Non-Medical): No  Physical Activity: Insufficiently Active  . Days of Exercise per Week: 7 days  . Minutes of Exercise per Session: 20 min  Stress: No Stress Concern Present  . Feeling of Stress : Not at all  Social Connections:   . Frequency of Communication with Friends and Family:   . Frequency of Social Gatherings with Friends and Family:   . Attends Religious Services:   . Active Member of Clubs or Organizations:   . Attends Archivist Meetings:   Marland Kitchen Marital Status:     Outpatient Encounter Medications as of 10/28/2019  Medication Sig  . Cholecalciferol (VITAMIN D-3 PO) Take 2,000 Units by mouth daily.  . felodipine (PLENDIL) 10 MG 24 hr tablet TAKE 1 TABLET BY MOUTH EVERY DAY  . fish oil-omega-3 fatty acids 1000 MG capsule Take 2 capsules by mouth daily.    . Fluocinolone Acetonide Scalp 0.01 % OIL Apply to affected area of scalp as directed 1-2 x/day as needed for itching  . GLUCOSAMINE-CHONDROITIN PO Take 2 tablets by mouth daily.    Marland Kitchen L-Methylfolate-Algae-B12-B6 (METANX) 3-90.314-2-35 MG CAPS Take 1 capsule by mouth 2 (two) times daily.     . Menthol, Topical Analgesic, (BIOFREEZE ROLL-ON EX) Apply 1 application topically as needed (pain in back,neck,thumb).  . mometasone (NASONEX) 50 MCG/ACT nasal spray Place 2 sprays into the nose daily. As needed  . nitroGLYCERIN (NITROSTAT) 0.4 MG SL tablet Place 1 tablet (0.4 mg total) under the tongue every 5 (five) minutes as needed for chest pain.  Marland Kitchen OVER THE COUNTER MEDICATION as needed.  . Polyvinyl Alcohol-Povidone PF (REFRESH) 1.4-0.6 % SOLN Place 1 drop into both eyes daily as needed (dry eyes).   . pravastatin (PRAVACHOL) 40 MG tablet TAKE 1 TABLET BY MOUTH ON MONDAY, WEDNESDAY, AND FRIDAY   No facility-administered encounter medications on file as of 10/28/2019.    Activities of Daily Living In your present state of health, do you have  any difficulty performing the following activities: 10/28/2019  Hearing? Y  Comment decreased hearing in one ear  Vision? N  Difficulty concentrating or making decisions? Y  Walking or climbing stairs? N  Dressing or bathing? N  Doing errands, shopping? N  Preparing Food and eating ? N  Using the Toilet? N  In the past six months, have you accidently leaked urine? N  Do you have problems with loss of bowel control? N  Managing your Medications? N  Managing your Finances? N  Housekeeping or managing your Housekeeping? N  Some recent data might be hidden    Patient Care Team: Glendale Chard, MD as PCP - General (Internal Medicine) Belva Crome, MD as PCP - Cardiology (Cardiology) Mauro Kaufmann, RN as Oncology Nurse Navigator Carlynn Spry, Charlott Holler, RN as Oncology Nurse Navigator Excell Seltzer, MD (Inactive) as Consulting Physician (General Surgery) Truitt Merle, MD as Consulting Physician (Hematology) Gery Pray, MD as Consulting Physician (Radiation Oncology) Alla Feeling, NP as Nurse Practitioner (Nurse Practitioner)    Assessment:   This is a routine wellness examination for [facility].  Exercise Activities and Dietary  recommendations Current Exercise Habits: Home exercise routine, Type of exercise: walking, Time (Minutes): 20, Frequency (Times/Week): 7, Weekly Exercise (Minutes/Week): 140, Intensity: Mild  Goals    . Patient Stated     10/28/2019, wants to eat healthy, exercise and get out of the house daily    . Weight (lb) < 200 lb (90.7 kg)     Just wants to lose some weight no real set goal       Fall Risk Fall Risk  10/28/2019 10/23/2018 06/30/2018 05/15/2018 12/23/2014  Falls in the past year? 0 0 0 0 No  Risk for fall due to : Medication side effect Medication side effect - - -  Follow up Falls evaluation completed;Education provided;Falls prevention discussed Falls prevention discussed - - -   Is the patient's home free of loose throw rugs in walkways, pet beds, electrical cords, etc?   yes      Grab bars in the bathroom? no      Handrails on the stairs?   yes      Adequate lighting?   yes  Timed Get Up and Go performed: n/a  Depression Screen PHQ 2/9 Scores 10/28/2019 12/11/2018 10/23/2018 06/30/2018  PHQ - 2 Score 1 0 0 0  PHQ- 9 Score 4 2 3  -     Cognitive Function     6CIT Screen 10/28/2019 10/23/2018  What Year? 0 points 0 points  What month? 0 points 0 points  What time? 0 points 0 points  Count back from 20 0 points 0 points  Months in reverse 0 points 0 points  Repeat phrase 0 points 0 points  Total Score 0 0    Immunization History  Administered Date(s) Administered  . Fluad Quad(high Dose 65+) 02/12/2019  . Influenza Split 04/10/2011, 04/15/2012, 03/04/2013, 03/04/2014, 03/05/2015, 03/04/2017  . Influenza Whole 06/13/2010  . Influenza,inj,Quad PF,6+ Mos 03/22/2016  . Influenza-Unspecified 03/22/2016, 03/04/2018  . PFIZER SARS-COV-2 Vaccination 08/09/2019, 09/08/2019  . Pneumococcal Conjugate-13 11/13/2017  . Pneumococcal Polysaccharide-23 06/04/2009, 12/28/2010, 06/11/2019  . Td 05/31/2008  . Tdap 12/13/2018    Qualifies for Shingles Vaccine? yes  Screening  Tests Health Maintenance  Topic Date Due  . COLONOSCOPY  01/27/2019  . URINE MICROALBUMIN  12/11/2019  . HEMOGLOBIN A1C  12/09/2019  . FOOT EXAM  12/11/2019  . INFLUENZA VACCINE  01/03/2020  . OPHTHALMOLOGY EXAM  01/20/2020  . MAMMOGRAM  08/05/2021  . TETANUS/TDAP  12/12/2028  . DEXA SCAN  Completed  . COVID-19 Vaccine  Completed  . Hepatitis C Screening  Completed  . PNA vac Low Risk Adult  Completed    Cancer Screenings: Lung: Low Dose CT Chest recommended if Age 71-80 years, 30 pack-year currently smoking OR have quit w/in 15years. Patient does not qualify. Breast:  Up to date on Mammogram? Yes   Up to date of Bone Density/Dexa? Yes Colorectal: referred  Additional Screenings: : Hepatitis C Screening: 05/23/2018     Plan:    Patient wants to eat healthy, exercise and get out of the house daily.   I have personally reviewed and noted the following in the patient's chart:   . Medical and social history . Use of alcohol, tobacco or illicit drugs  . Current medications and supplements . Functional ability and status . Nutritional status . Physical activity . Advanced directives . List of other physicians . Hospitalizations, surgeries, and ER visits in previous 12 months . Vitals . Screenings to include cognitive, depression, and falls . Referrals and appointments  In addition, I have reviewed and discussed with patient certain preventive protocols, quality metrics, and best practice recommendations. A written personalized care plan for preventive services as well as general preventive health recommendations were provided to patient.     Kellie Simmering, LPN  [id number]

## 2019-10-28 NOTE — Addendum Note (Signed)
Addended by: Kellie Simmering on: 10/28/2019 01:45 PM   Modules accepted: Orders

## 2019-10-29 DIAGNOSIS — G4733 Obstructive sleep apnea (adult) (pediatric): Secondary | ICD-10-CM | POA: Diagnosis not present

## 2019-10-29 LAB — HEMOGLOBIN A1C
Est. average glucose Bld gHb Est-mCnc: 117 mg/dL
Hgb A1c MFr Bld: 5.7 % — ABNORMAL HIGH (ref 4.8–5.6)

## 2019-10-29 LAB — VITAMIN D 25 HYDROXY (VIT D DEFICIENCY, FRACTURES): Vit D, 25-Hydroxy: 43 ng/mL (ref 30.0–100.0)

## 2019-11-03 ENCOUNTER — Other Ambulatory Visit: Payer: Self-pay

## 2019-11-03 ENCOUNTER — Encounter: Payer: Self-pay | Admitting: Internal Medicine

## 2019-11-10 DIAGNOSIS — M25519 Pain in unspecified shoulder: Secondary | ICD-10-CM | POA: Diagnosis not present

## 2019-11-10 DIAGNOSIS — M6281 Muscle weakness (generalized): Secondary | ICD-10-CM | POA: Diagnosis not present

## 2019-11-10 DIAGNOSIS — M25569 Pain in unspecified knee: Secondary | ICD-10-CM | POA: Diagnosis not present

## 2019-11-11 DIAGNOSIS — M6281 Muscle weakness (generalized): Secondary | ICD-10-CM | POA: Diagnosis not present

## 2019-11-11 DIAGNOSIS — M25519 Pain in unspecified shoulder: Secondary | ICD-10-CM | POA: Diagnosis not present

## 2019-11-11 DIAGNOSIS — M25569 Pain in unspecified knee: Secondary | ICD-10-CM | POA: Diagnosis not present

## 2019-11-12 DIAGNOSIS — R1310 Dysphagia, unspecified: Secondary | ICD-10-CM | POA: Diagnosis not present

## 2019-11-12 DIAGNOSIS — K219 Gastro-esophageal reflux disease without esophagitis: Secondary | ICD-10-CM | POA: Diagnosis not present

## 2019-11-12 DIAGNOSIS — Z86010 Personal history of colonic polyps: Secondary | ICD-10-CM | POA: Diagnosis not present

## 2019-11-13 ENCOUNTER — Other Ambulatory Visit: Payer: Self-pay | Admitting: Gastroenterology

## 2019-11-13 DIAGNOSIS — R1310 Dysphagia, unspecified: Secondary | ICD-10-CM

## 2019-11-16 ENCOUNTER — Encounter: Payer: Self-pay | Admitting: Internal Medicine

## 2019-11-17 DIAGNOSIS — M25519 Pain in unspecified shoulder: Secondary | ICD-10-CM | POA: Diagnosis not present

## 2019-11-17 DIAGNOSIS — M25569 Pain in unspecified knee: Secondary | ICD-10-CM | POA: Diagnosis not present

## 2019-11-17 DIAGNOSIS — M6281 Muscle weakness (generalized): Secondary | ICD-10-CM | POA: Diagnosis not present

## 2019-11-20 ENCOUNTER — Ambulatory Visit
Admission: RE | Admit: 2019-11-20 | Discharge: 2019-11-20 | Disposition: A | Discharge status: Home / Self Care | Payer: Medicare PPO | Source: Ambulatory Visit | Attending: Gastroenterology | Admitting: Gastroenterology

## 2019-11-20 DIAGNOSIS — M25569 Pain in unspecified knee: Secondary | ICD-10-CM | POA: Diagnosis not present

## 2019-11-20 DIAGNOSIS — K222 Esophageal obstruction: Secondary | ICD-10-CM | POA: Diagnosis not present

## 2019-11-20 DIAGNOSIS — K449 Diaphragmatic hernia without obstruction or gangrene: Secondary | ICD-10-CM | POA: Diagnosis not present

## 2019-11-20 DIAGNOSIS — M6281 Muscle weakness (generalized): Secondary | ICD-10-CM | POA: Diagnosis not present

## 2019-11-20 DIAGNOSIS — M25519 Pain in unspecified shoulder: Secondary | ICD-10-CM | POA: Diagnosis not present

## 2019-11-20 DIAGNOSIS — R1310 Dysphagia, unspecified: Secondary | ICD-10-CM

## 2019-11-23 DIAGNOSIS — G603 Idiopathic progressive neuropathy: Secondary | ICD-10-CM | POA: Diagnosis not present

## 2019-11-23 DIAGNOSIS — I739 Peripheral vascular disease, unspecified: Secondary | ICD-10-CM | POA: Diagnosis not present

## 2019-11-23 DIAGNOSIS — E1351 Other specified diabetes mellitus with diabetic peripheral angiopathy without gangrene: Secondary | ICD-10-CM | POA: Diagnosis not present

## 2019-11-23 DIAGNOSIS — L602 Onychogryphosis: Secondary | ICD-10-CM | POA: Diagnosis not present

## 2019-11-24 DIAGNOSIS — M6281 Muscle weakness (generalized): Secondary | ICD-10-CM | POA: Diagnosis not present

## 2019-11-24 DIAGNOSIS — M25569 Pain in unspecified knee: Secondary | ICD-10-CM | POA: Diagnosis not present

## 2019-11-24 DIAGNOSIS — M25519 Pain in unspecified shoulder: Secondary | ICD-10-CM | POA: Diagnosis not present

## 2019-11-26 ENCOUNTER — Encounter: Payer: Self-pay | Admitting: Internal Medicine

## 2019-11-26 ENCOUNTER — Ambulatory Visit: Payer: Medicare PPO | Admitting: Internal Medicine

## 2019-11-26 ENCOUNTER — Other Ambulatory Visit: Payer: Self-pay

## 2019-11-26 ENCOUNTER — Ambulatory Visit (INDEPENDENT_AMBULATORY_CARE_PROVIDER_SITE_OTHER): Payer: Medicare PPO

## 2019-11-26 VITALS — BP 122/62 | HR 68 | Temp 97.4°F | Ht 59.0 in | Wt 150.2 lb

## 2019-11-26 DIAGNOSIS — D869 Sarcoidosis, unspecified: Secondary | ICD-10-CM | POA: Diagnosis not present

## 2019-11-26 DIAGNOSIS — M6281 Muscle weakness (generalized): Secondary | ICD-10-CM | POA: Diagnosis not present

## 2019-11-26 DIAGNOSIS — M25569 Pain in unspecified knee: Secondary | ICD-10-CM | POA: Diagnosis not present

## 2019-11-26 DIAGNOSIS — M25519 Pain in unspecified shoulder: Secondary | ICD-10-CM | POA: Diagnosis not present

## 2019-11-26 DIAGNOSIS — G4733 Obstructive sleep apnea (adult) (pediatric): Secondary | ICD-10-CM

## 2019-11-26 NOTE — Progress Notes (Signed)
Subjective:    Patient ID: Mariah Burns, female    DOB: 04-09-1946, 74 y.o.   MRN: [id number]  HPI female never smoker followed for OSA, Sarcoid, complicated by HBP, DM NPSG 03/08/2010  AHI 19.8/hr  Office spirometry 09/20/15- WNL-FEV1/FVC 0.85 ------------------------------------------------------------------------------------------   11/24/2018- 74 year old female never smoker followed for OSA, Sarcoid, complicated by HBP, DM, Cancer R breast XRT, CPAP 7/[facility]     Autoset machine >> today change to auto 5-15 -----OSA on CPAP 7 // DME: [facility]; pt states she may need her pressure settings changed, breathing at baseline Download 100% compliance, AHI 3.8/ hr Body weight today 146 lbs Had lumpectomy/ XRT for R breast CA. Breathing ok. CPAP - occ wakes with dry mouth, eyes watering. Discussed humidifier. Not using Trazodone now while on tramadol.  11/26/19- 74 year old female never smoker followed for OSA, Sarcoid, complicated by HBP, DM, Cancer R breast XRT, CPAP auto 5-15/ [facility] Download compliance 70%, AHI 1.1/ hr Body weight today- 150 lbs Tried different masks. Feels "too much air". Asks if she still needs CPAP.  Review of Systems-see HPI  + = positive Constitutional:   No-   weight loss, night sweats, fevers, chills, +fatigue, lassitude. HEENT:   No-  headaches, difficulty swallowing, tooth/dental problems, sore throat,       No-  sneezing, itching, ear ache, nasal congestion, post nasal drip,  CV:  No-   chest pain, orthopnea, PND, swelling in lower extremities, anasarca, dizziness, palpitations Resp: +shortness of breath with exertion or at rest.              No-   productive cough,  No non-productive cough,  No- coughing up of blood.              No-   change in color of mucus.  No- wheezing.   Skin: No-   rash or lesions. GI:  No-   heartburn, indigestion, abdominal pain, nausea, vomiting,  GU:  MS:  No-   joint pain or swelling.   Neuro-      nothing unusual Psych:  No- change in mood or affect. No depression or anxiety.  No memory loss.  Objective:   Physical Exam    General- Alert, Oriented, Affect-appropriate, Distress- none acute; medium build Skin- rash-none, lesions- none, excoriation- none Lymphadenopathy- none Head- atraumatic            Eyes- Gross vision intact, PERRLA, conjunctivae clear secretions            Ears- Hearing, canals-normal            Nose- Clear, no-Septal dev, mucus, polyps, erosion, perforation             Throat- Mallampati III , mucosa- not very dry , drainage- none, tonsils- atrophic Neck- flexible , trachea midline, no stridor , thyroid nl, carotid no bruit Chest - symmetrical excursion , unlabored           Heart/CV- RRR ,  Murmur+Trace systolic AS , no gallop  , no rub, nl s1 s2                           - JVD- none , edema- none, stasis changes- none, varices- none           Lung- clear to P&A, wheeze- none, cough- none , dullness-none, rub- none           Chest wall-  Abd-  Br/ Gen/  Rectal- Not done, not indicated Extrem- cyanosis- none, clubbing, none, atrophy- none, strength- nl Neuro- grossly intact to observation

## 2019-11-26 NOTE — Patient Instructions (Signed)
Order- schedule home sleep test (off CPAP)   Dx OSA  Please call us about 2 weeks after your sleep test to see if results and recommendations are ready yet. If appropriate, we can either stop CPAP then, or we can reduce the pressure setting and arrange a mask fitting and humidifier adjustment.

## 2019-11-30 ENCOUNTER — Other Ambulatory Visit: Payer: Medicare Other

## 2019-11-30 ENCOUNTER — Ambulatory Visit: Payer: Medicare Other | Admitting: Hematology

## 2019-11-30 DIAGNOSIS — M25519 Pain in unspecified shoulder: Secondary | ICD-10-CM | POA: Diagnosis not present

## 2019-11-30 DIAGNOSIS — M6281 Muscle weakness (generalized): Secondary | ICD-10-CM | POA: Diagnosis not present

## 2019-11-30 DIAGNOSIS — M25569 Pain in unspecified knee: Secondary | ICD-10-CM | POA: Diagnosis not present

## 2019-12-01 NOTE — Progress Notes (Signed)
You don't need to route this to me if I'm on call and there are no issues.

## 2019-12-08 DIAGNOSIS — M25569 Pain in unspecified knee: Secondary | ICD-10-CM | POA: Diagnosis not present

## 2019-12-08 DIAGNOSIS — M6281 Muscle weakness (generalized): Secondary | ICD-10-CM | POA: Diagnosis not present

## 2019-12-08 DIAGNOSIS — M25519 Pain in unspecified shoulder: Secondary | ICD-10-CM | POA: Diagnosis not present

## 2019-12-11 DIAGNOSIS — M25569 Pain in unspecified knee: Secondary | ICD-10-CM | POA: Diagnosis not present

## 2019-12-11 DIAGNOSIS — M25519 Pain in unspecified shoulder: Secondary | ICD-10-CM | POA: Diagnosis not present

## 2019-12-11 DIAGNOSIS — M6281 Muscle weakness (generalized): Secondary | ICD-10-CM | POA: Diagnosis not present

## 2019-12-14 DIAGNOSIS — M25519 Pain in unspecified shoulder: Secondary | ICD-10-CM | POA: Diagnosis not present

## 2019-12-14 DIAGNOSIS — M6281 Muscle weakness (generalized): Secondary | ICD-10-CM | POA: Diagnosis not present

## 2019-12-14 DIAGNOSIS — M25569 Pain in unspecified knee: Secondary | ICD-10-CM | POA: Diagnosis not present

## 2019-12-16 DIAGNOSIS — M25519 Pain in unspecified shoulder: Secondary | ICD-10-CM | POA: Diagnosis not present

## 2019-12-16 DIAGNOSIS — M25569 Pain in unspecified knee: Secondary | ICD-10-CM | POA: Diagnosis not present

## 2019-12-16 DIAGNOSIS — M6281 Muscle weakness (generalized): Secondary | ICD-10-CM | POA: Diagnosis not present

## 2019-12-17 DIAGNOSIS — M6281 Muscle weakness (generalized): Secondary | ICD-10-CM | POA: Diagnosis not present

## 2019-12-17 DIAGNOSIS — M25519 Pain in unspecified shoulder: Secondary | ICD-10-CM | POA: Diagnosis not present

## 2019-12-17 DIAGNOSIS — M25569 Pain in unspecified knee: Secondary | ICD-10-CM | POA: Diagnosis not present

## 2019-12-21 DIAGNOSIS — M6281 Muscle weakness (generalized): Secondary | ICD-10-CM | POA: Diagnosis not present

## 2019-12-21 DIAGNOSIS — M25519 Pain in unspecified shoulder: Secondary | ICD-10-CM | POA: Diagnosis not present

## 2019-12-21 DIAGNOSIS — M25569 Pain in unspecified knee: Secondary | ICD-10-CM | POA: Diagnosis not present

## 2019-12-23 DIAGNOSIS — M6281 Muscle weakness (generalized): Secondary | ICD-10-CM | POA: Diagnosis not present

## 2019-12-23 DIAGNOSIS — M25569 Pain in unspecified knee: Secondary | ICD-10-CM | POA: Diagnosis not present

## 2019-12-23 DIAGNOSIS — M25519 Pain in unspecified shoulder: Secondary | ICD-10-CM | POA: Diagnosis not present

## 2019-12-24 ENCOUNTER — Ambulatory Visit: Payer: Medicare PPO

## 2019-12-24 ENCOUNTER — Other Ambulatory Visit: Payer: Self-pay

## 2019-12-24 ENCOUNTER — Encounter: Payer: Medicare Other | Admitting: Internal Medicine

## 2019-12-24 DIAGNOSIS — G4733 Obstructive sleep apnea (adult) (pediatric): Secondary | ICD-10-CM | POA: Diagnosis not present

## 2019-12-25 DIAGNOSIS — G4733 Obstructive sleep apnea (adult) (pediatric): Secondary | ICD-10-CM | POA: Diagnosis not present

## 2019-12-28 DIAGNOSIS — M25569 Pain in unspecified knee: Secondary | ICD-10-CM | POA: Diagnosis not present

## 2019-12-28 DIAGNOSIS — M25519 Pain in unspecified shoulder: Secondary | ICD-10-CM | POA: Diagnosis not present

## 2019-12-28 DIAGNOSIS — M6281 Muscle weakness (generalized): Secondary | ICD-10-CM | POA: Diagnosis not present

## 2019-12-30 DIAGNOSIS — M6281 Muscle weakness (generalized): Secondary | ICD-10-CM | POA: Diagnosis not present

## 2019-12-30 DIAGNOSIS — M25569 Pain in unspecified knee: Secondary | ICD-10-CM | POA: Diagnosis not present

## 2019-12-30 DIAGNOSIS — M25519 Pain in unspecified shoulder: Secondary | ICD-10-CM | POA: Diagnosis not present

## 2020-01-12 DIAGNOSIS — Z86010 Personal history of colonic polyps: Secondary | ICD-10-CM | POA: Diagnosis not present

## 2020-01-12 DIAGNOSIS — K219 Gastro-esophageal reflux disease without esophagitis: Secondary | ICD-10-CM | POA: Diagnosis not present

## 2020-01-20 ENCOUNTER — Telehealth: Payer: Self-pay | Admitting: Internal Medicine

## 2020-01-20 NOTE — Telephone Encounter (Signed)
Patient calling for results of home sleep study on 12/24/19, please advise.

## 2020-01-21 DIAGNOSIS — H16223 Keratoconjunctivitis sicca, not specified as Sjogren's, bilateral: Secondary | ICD-10-CM | POA: Diagnosis not present

## 2020-01-21 DIAGNOSIS — H35033 Hypertensive retinopathy, bilateral: Secondary | ICD-10-CM | POA: Diagnosis not present

## 2020-01-21 DIAGNOSIS — E119 Type 2 diabetes mellitus without complications: Secondary | ICD-10-CM | POA: Diagnosis not present

## 2020-01-21 DIAGNOSIS — H2513 Age-related nuclear cataract, bilateral: Secondary | ICD-10-CM | POA: Diagnosis not present

## 2020-01-21 LAB — HM DIABETES EYE EXAM

## 2020-01-25 NOTE — Assessment & Plan Note (Signed)
Unlikely to be clinically acitve at age 74 Plan- CXR

## 2020-01-25 NOTE — Telephone Encounter (Signed)
Sleep study still shows sleep apnea, averaging 12 apneas/ hour, with drops in blood oxygen level.  Recommend we order- DME reduce autopap range to 4-10.                              Order- referral for  Mask Fitting at [facility]

## 2020-01-25 NOTE — Assessment & Plan Note (Signed)
Benefits from CPAP but asks if she still needs it. Plan- HST off CPAP. Thenif still needs it, reduce pressure to auto 4-10 and do mask fitting.

## 2020-01-26 NOTE — Telephone Encounter (Signed)
Attempted to call pt but unable to reach. Left message for her to return call. 

## 2020-01-28 ENCOUNTER — Telehealth: Payer: Self-pay | Admitting: Internal Medicine

## 2020-01-28 DIAGNOSIS — G4733 Obstructive sleep apnea (adult) (pediatric): Secondary | ICD-10-CM

## 2020-01-28 NOTE — Telephone Encounter (Signed)
Sleep study still shows sleep apnea, averaging 12 apneas/ hour, with drops in blood oxygen level.  Recommend we order- DME reduce autopap range to 4-10.                              Order- referral for  Mask Fitting at [facility] pt but no answer- LMTCB

## 2020-01-29 ENCOUNTER — Telehealth: Payer: Self-pay | Admitting: Hematology

## 2020-01-29 NOTE — Telephone Encounter (Signed)
Rescheduled appts on 9/10 to 9/17. YF call day. Pt is aware of appt time and date.

## 2020-02-02 NOTE — Telephone Encounter (Signed)
ATC pt on both numbers. Unable to leave VM. WCB.

## 2020-02-03 NOTE — Telephone Encounter (Signed)
Called and spoke with patient letting her know that HST showed that she still has sleep apnea. Orders have been placed to change CPAP settings and mask fitting. Patient expressed understanding and was made aware of scheduled appointment in October. Nothing further needed at this time.

## 2020-02-09 ENCOUNTER — Other Ambulatory Visit: Payer: Self-pay

## 2020-02-09 ENCOUNTER — Ambulatory Visit: Payer: Medicare PPO | Admitting: Interventional Cardiology

## 2020-02-09 ENCOUNTER — Encounter: Payer: Self-pay | Admitting: Interventional Cardiology

## 2020-02-09 VITALS — BP 128/60 | HR 64 | Ht 59.0 in | Wt 148.8 lb

## 2020-02-09 DIAGNOSIS — Z7189 Other specified counseling: Secondary | ICD-10-CM

## 2020-02-09 DIAGNOSIS — I10 Essential (primary) hypertension: Secondary | ICD-10-CM | POA: Diagnosis not present

## 2020-02-09 DIAGNOSIS — I3139 Other pericardial effusion (noninflammatory): Secondary | ICD-10-CM

## 2020-02-09 DIAGNOSIS — E118 Type 2 diabetes mellitus with unspecified complications: Secondary | ICD-10-CM

## 2020-02-09 DIAGNOSIS — I313 Pericardial effusion (noninflammatory): Secondary | ICD-10-CM | POA: Diagnosis not present

## 2020-02-09 DIAGNOSIS — I4510 Unspecified right bundle-branch block: Secondary | ICD-10-CM | POA: Diagnosis not present

## 2020-02-09 DIAGNOSIS — D869 Sarcoidosis, unspecified: Secondary | ICD-10-CM | POA: Diagnosis not present

## 2020-02-09 NOTE — Progress Notes (Signed)
Cardiology Office Note:    Date:  02/09/2020   ID:  [name], DOB [birth year], MRN [id number]  PCP:  Glendale Chard, MD  Cardiologist:  Sinclair Grooms, MD   Referring MD: Glendale Chard, MD   Chief Complaint  Patient presents with  . Congestive Heart Failure  . Chest Pain  . Hypertension  . Hyperlipidemia    History of Present Illness:    Mariah Burns is a 74 y.o. female with a hx of mild LVH, sarcoidosis, mild pulmonary hypertension, hypertension, hyperlipidemia, and diabetes.mellitus type II.   She has no cardiac complaints.  She has significant cardiac comorbidities that include those noted above.  She specifically denies orthopnea, PND, palpitations, syncope, edema, transient neurological symptoms, and claudication.  She has significant reduction in exertional tolerance.  This has been aggravated by the COVID-19 pandemic which is prevented regular exercise.  She is currently getting greater than 150 minutes of moderate activity per week from walking and with swimming.  Sarcoidosis has been quiesced sent.  She is compliant with CPAP.  She is compliant with medication regimen.    Past Medical History:  Diagnosis Date  . Anxiety   . Cancer ([place]) 08/2018   right breast IDC  . Hyperlipidemia   . Hypertension   . OSA (obstructive sleep apnea)    NPSG 03/08/2010  AHI 19.8/hr, CPAP nightly  . Osteopenia   . Pre-diabetes   . 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 BREAST LUMPECTOMY WITH RADIOACTIVE SEED AND SENTINEL LYMPH NODE BIOPSY Right 09/02/2018   Procedure: RIGHT BREAST LUMPECTOMY WITH RADIOACTIVE SEED AND  RIGHT SENTINEL LYMPH NODE BIOPSY;  Surgeon: Excell Seltzer, MD;  Location: [facility];  Service: General;  Laterality: Right;  . BRONCHOSCOPY     dx'd sarcoid  . KNEE  ARTHROPLASTY Left 03/13/2017  . NEUROPLASTY / TRANSPOSITION ULNAR NERVE AT ELBOW  09/2011  . TOTAL ABDOMINAL HYSTERECTOMY      Current Medications: Current Meds  Medication Sig  . aspirin EC 81 MG tablet Take 81 mg by mouth daily. Swallow whole.  . Cholecalciferol (VITAMIN D-3 PO) Take 1,000 Units by mouth daily.  . famotidine (PEPCID) 40 MG tablet   . felodipine (PLENDIL) 10 MG 24 hr tablet TAKE 1 TABLET BY MOUTH EVERY DAY  . fish oil-omega-3 fatty acids 1000 MG capsule Take 2 capsules by mouth daily.    . Fluocinolone Acetonide Scalp 0.01 % OIL Apply to affected area of scalp as directed 1-2 x/day as needed for itching  . GLUCOSAMINE-CHONDROITIN PO Take 2 tablets by mouth daily.    Marland Kitchen L-Methylfolate-Algae-B12-B6 (METANX) 3-90.314-2-35 MG CAPS Take 1 capsule by mouth 2 (two) times daily.   . Menthol, Topical Analgesic, (BIOFREEZE ROLL-ON EX) Apply 1 application topically as needed (pain in back,neck,thumb).  . mometasone (NASONEX) 50 MCG/ACT nasal spray Place 2 sprays into the nose daily. As needed  . nitroGLYCERIN (NITROSTAT) 0.4 MG SL tablet Place 1 tablet (0.4 mg total) under the tongue every 5 (five) minutes as needed for chest pain.  Marland Kitchen OVER THE COUNTER MEDICATION as needed.  . Polyvinyl Alcohol-Povidone PF (REFRESH) 1.4-0.6 % SOLN Place 1 drop into both eyes daily as needed (dry eyes).   . pravastatin (PRAVACHOL) 40 MG tablet TAKE 1 TABLET BY MOUTH ON MONDAY, WEDNESDAY, AND FRIDAY  Allergies:   Fosamax [alendronate sodium], Omeprazole, Motrin [ibuprofen], and Fluvastatin   Social History   Socioeconomic History  . Marital status: Divorced    Spouse name: Not on file  . Number of children: 3  . Years of education: College  . Highest education level: Not on file  Occupational History  . Occupation: Retired    Fish farm manager: RETIRED  Tobacco Use  . Smoking status: Never Smoker  . Smokeless tobacco: Never Used  Vaping Use  . Vaping Use: Never used  Substance and Sexual  Activity  . Alcohol use: No  . Drug use: No  . Sexual activity: Not Currently  Other Topics Concern  . Not on file  Social History Narrative   Patient lives at home with daughter.   Caffeine Use: none   Social Determinants of Health   Financial Resource Strain: Low Risk   . Difficulty of Paying Living Expenses: Not hard at all  Food Insecurity: No Food Insecurity  . Worried About Charity fundraiser in the Last Year: Never true  . Ran Out of Food in the Last Year: Never true  Transportation Needs: No Transportation Needs  . Lack of Transportation (Medical): No  . Lack of Transportation (Non-Medical): No  Physical Activity: Insufficiently Active  . Days of Exercise per Week: 7 days  . Minutes of Exercise per Session: 20 min  Stress: No Stress Concern Present  . Feeling of Stress : Not at all  Social Connections:   . Frequency of Communication with Friends and Family: Not on file  . Frequency of Social Gatherings with Friends and Family: Not on file  . Attends Religious Services: Not on file  . Active Member of Clubs or Organizations: Not on file  . Attends Archivist Meetings: Not on file  . Marital Status: Not on file     Family History: The patient's family history includes Allergies in her daughter, mother, and sister; Gout in her brother and brother; Healthy in her daughter and daughter; Heart disease in her brother, sister, and sister; Heart failure in her father; Hypertension in her brother, brother, brother, and brother; Lupus in her sister; Post-traumatic stress disorder in her brother.  ROS:   Please see the history of present illness.    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  No chemotherapy.  She has had fatigue after chemotherapy.  She complains of having neuropathy but does not have an idea relative to etiology.  She is not diabetic.  She does have sarcoidosis which both joint and pulmonary  involvement.  Currently quiesced sent.  All other systems reviewed and are negative.  EKGs/Labs/Other Studies Reviewed:    The following studies were reviewed today:  2D Doppler echocardiogram September 2020: IMPRESSIONS    1. The left ventricle has hyperdynamic systolic function, with an  ejection fraction of >65%. The cavity size was normal. Left ventricular  diastolic Doppler parameters are consistent with pseudonormalization.  Elevated left atrial and left ventricular  end-diastolic pressures The E/e' is 19. No evidence of left ventricular  regional wall motion abnormalities.  2. The right ventricle has normal systolic function. The cavity was  normal. There is no increase in right ventricular wall thickness. Right  ventricular systolic pressure is normal.  3. Left atrial size was mildly dilated.  4. Small pericardial effusion.  5. The pericardial effusion is circumferential.  6. No evidence of mitral valve stenosis.  7. Tricuspid valve regurgitation  is moderate.  8. The aortic valve is tricuspid. Aortic valve regurgitation is trivial  by color flow Doppler. No stenosis of the aortic valve.  9. The aorta is normal unless otherwise noted.  10. The aortic root, ascending aorta and aortic arch are normal in size  and structure.     EKG:  EKG sinus rhythm, right bundle branch block, left atrial abnormality, normal PR interval.  When compared to prior, PR interval is shorter.  Recent Labs: 06/11/2019: Magnesium 2.1; TSH 1.240 08/12/2019: ALT 15; BUN 12; Creatinine 0.94; Hemoglobin 11.8; Platelet Count 168; Potassium 3.5; Sodium 144  Recent Lipid Panel    Component Value Date/Time   CHOL 188 12/11/2018 1212   TRIG 61 12/11/2018 1212   HDL 64 12/11/2018 1212   CHOLHDL 2.9 12/11/2018 1212   LDLCALC 112 (H) 12/11/2018 1212    Physical Exam:    VS:  BP 128/60   Pulse 64   Ht 4\' 11"  (1.499 m)   Wt 148 lb 12.8 oz (67.5 kg)   SpO2 96%   BMI 30.05 kg/m     Wt  Readings from Last 3 Encounters:  02/09/20 148 lb 12.8 oz (67.5 kg)  11/26/19 150 lb 3.2 oz (68.1 kg)  10/28/19 148 lb (67.1 kg)     GEN: Mildly overweight. No acute distress HEENT: Normal NECK: No JVD. LYMPHATICS: No lymphadenopathy CARDIAC: 1-2 over 6 right upper sternal systolic murmur.  RRR without diastolic murmur, gallop, or edema. VASCULAR:  Normal Pulses. No bruits. RESPIRATORY:  Clear to auscultation without rales, wheezing or rhonchi  ABDOMEN: Soft, non-tender, non-distended, No pulsatile mass, MUSCULOSKELETAL: No deformity  SKIN: Warm and dry NEUROLOGIC:  Alert and oriented x 3 PSYCHIATRIC:  Normal affect   ASSESSMENT:    1. Essential hypertension   2. Pericardial effusion   3. Sarcoidosis   4. Incomplete right bundle branch block   5. Type 2 diabetes mellitus with complication, without long-term current use of insulin (HCC)   6. Educated about COVID-19 virus infection    PLAN:    In order of problems listed above:  1. Blood pressure is well controlled.  Continue Plendil, with target goal less than or equal to 130/80 mmHg. 2. No clinical evidence of recurrent pericardial effusion.  An echocardiogram a year ago revealed a small circumferential effusion.  No clinical features to suggest significant recurrence or growth. 3. PR interval is normal.  Echo did not demonstrate abnormality other than diastolic dysfunction, mildly dilated left atrium, small pericardial effusion, 4. No bundle branch block is noted on today's exam. 5. Target hemoglobin A1c less than 7.  The most recent reported was 5.09 Oct 2019.Marland Kitchen  Aerobic exercise encouraged. 6. COVID-19 vaccination has been received.  She is practicing medication.  She is overall quite stable.   Since diabetes mellitus type 2 is a cardiovascular disease risk equivalent: Overall education and awareness concerning primary risk prevention was discussed in detail: LDL less than 70, hemoglobin A1c less than 7, blood pressure  target less than 130/80 mmHg, >150 minutes of moderate aerobic activity per week, avoidance of smoking, weight control (via diet and exercise), and continued surveillance/management of/for obstructive sleep apnea.    Medication Adjustments/Labs and Tests Ordered: Current medicines are reviewed at length with the patient today.  Concerns regarding medicines are outlined above.  Orders Placed This Encounter  Procedures  . EKG 12-Lead   No orders of the defined types were placed in this encounter.   Patient Instructions  Medication Instructions:  Your physician recommends that you continue on your current medications as directed. Please refer to the Current Medication list given to you today.  *If you need a refill on your cardiac medications before your next appointment, please call your pharmacy*   Lab Work: None If you have labs (blood work) drawn today and your tests are completely normal, you will receive your results only by: Marland Kitchen MyChart Message (if you have MyChart) OR . A paper copy in the mail If you have any lab test that is abnormal or we need to change your treatment, we will call you to review the results.   Testing/Procedures: None   Follow-Up: At [facility], you and your health needs are our priority.  As part of our continuing mission to provide you with exceptional heart care, we have created designated Provider Care Teams.  These Care Teams include your primary Cardiologist (physician) and Advanced Practice Providers (APPs -  Physician Assistants and Nurse Practitioners) who all work together to provide you with the care you need, when you need it.  We recommend signing up for the patient portal called "MyChart".  Sign up information is provided on this After Visit Summary.  MyChart is used to connect with patients for Virtual Visits (Telemedicine).  Patients are able to view lab/test results, encounter notes, upcoming appointments, etc.  Non-urgent messages can be  sent to your provider as well.   To learn more about what you can do with MyChart, go to [email or web address].    Your next appointment:   12 month(s)  The format for your next appointment:   In Person  Provider:   You may see Sinclair Grooms, MD or one of the following Advanced Practice Providers on your designated Care Team:    Truitt Merle, NP  Cecilie Kicks, NP  Kathyrn Drown, NP    Other Instructions      Signed, Sinclair Grooms, MD  02/09/2020 5:46 PM    [place]

## 2020-02-09 NOTE — Patient Instructions (Signed)

## 2020-02-10 ENCOUNTER — Encounter: Payer: Self-pay | Admitting: Internal Medicine

## 2020-02-11 ENCOUNTER — Ambulatory Visit: Payer: Medicare PPO | Admitting: Pulmonary Disease

## 2020-02-11 DIAGNOSIS — G4733 Obstructive sleep apnea (adult) (pediatric): Secondary | ICD-10-CM | POA: Diagnosis not present

## 2020-02-12 ENCOUNTER — Ambulatory Visit: Payer: Medicare PPO | Admitting: Hematology

## 2020-02-16 DIAGNOSIS — G603 Idiopathic progressive neuropathy: Secondary | ICD-10-CM | POA: Diagnosis not present

## 2020-02-16 DIAGNOSIS — M79672 Pain in left foot: Secondary | ICD-10-CM | POA: Diagnosis not present

## 2020-02-16 DIAGNOSIS — L603 Nail dystrophy: Secondary | ICD-10-CM | POA: Diagnosis not present

## 2020-02-16 DIAGNOSIS — M79671 Pain in right foot: Secondary | ICD-10-CM | POA: Diagnosis not present

## 2020-02-16 DIAGNOSIS — I739 Peripheral vascular disease, unspecified: Secondary | ICD-10-CM | POA: Diagnosis not present

## 2020-02-17 DIAGNOSIS — Z6829 Body mass index (BMI) 29.0-29.9, adult: Secondary | ICD-10-CM | POA: Diagnosis not present

## 2020-02-17 DIAGNOSIS — Z01419 Encounter for gynecological examination (general) (routine) without abnormal findings: Secondary | ICD-10-CM | POA: Diagnosis not present

## 2020-02-17 NOTE — Progress Notes (Signed)
[place]   [phone] Fax:(336) [phone]   Clinic Follow up Note   Patient Care Team: Mariah Chard, MD as PCP - General (Internal Medicine) Mariah Crome, MD as PCP - Cardiology (Cardiology) Mariah Kaufmann, RN as Oncology Nurse Navigator Mariah Germany, RN as Oncology Nurse Navigator Mariah Seltzer, MD (Inactive) as Consulting Physician (General Surgery) Mariah Merle, MD as Consulting Physician (Hematology) Mariah Pray, MD as Consulting Physician (Radiation Oncology) Mariah Feeling, NP as Nurse Practitioner (Nurse Practitioner)   I connected with Mariah Burns on 02/19/2020 at  3:00 PM EDT by telephone visit and verified that I am speaking with the correct person using two identifiers.  I discussed the limitations, risks, security and privacy concerns of performing an evaluation and management service by telephone and the availability of in person appointments. I also discussed with the patient that there may be a patient responsible charge related to this service. The patient expressed understanding and agreed to proceed.   Other persons participating in the visit and their role in the encounter:  None  Patient's location:  Her home  Provider's location:  My office   CHIEF COMPLAINT: F/u of right breast cancer  SUMMARY OF ONCOLOGIC HISTORY: Oncology History Overview Note  Cancer Staging Malignant neoplasm of upper-outer quadrant of right breast in female, estrogen receptor positive ([place]) Staging form: Breast, AJCC 8th Edition - Clinical stage from 08/04/2018: Stage IA (cT1c, cN0, cM0, G2, ER+, PR+, HER2-) - Signed by Mariah Merle, MD on 08/12/2018 - Pathologic stage from 09/02/2018: Stage IA (pT2, pN1a, cM0, G1, ER+, PR+, HER2-) - Signed by Mariah Merle, MD on 09/29/2018     Malignant neoplasm of upper-outer quadrant of right breast in female, estrogen receptor positive ([place])  07/29/2018 Mammogram   Diagnostic Mammgram 07/29/18  IMPRESSION:    The 1.7cm oval mass in the right breast upper outer qudrant middle third, 3-5 from nipple is suspicious of malignancy.    08/04/2018 Cancer Staging   Staging form: Breast, AJCC 8th Edition - Clinical stage from 08/04/2018: Stage IA (cT1c, cN0, cM0, G2, ER+, PR+, HER2-) - Signed by Mariah Merle, MD on 08/12/2018   08/04/2018 Initial Biopsy   Diagnosis 08/04/18  Breast, right, needle core biopsy, 1.7 cm irregular solid mass at 9 o'clock, 4 cm fn - INVASIVE DUCTAL CARCINOMA WITH EXTRACELLULAR MUCIN.   08/04/2018 Receptors her2   Results: IMMUNOHISTOCHEMICAL AND MORPHOMETRIC ANALYSIS PERFORMED MANUALLY The tumor cells are NEGATIVE for Her2 (0). Estrogen Receptor: 100%, POSITIVE, STRONG STAINING INTENSITY Progesterone Receptor: 100%, POSITIVE, STRONG STAINING INTENSITY Proliferation Marker Ki67: 10%   08/11/2018 Initial Diagnosis   Malignant neoplasm of upper-outer quadrant of right breast in female, estrogen receptor positive ([place])   09/02/2018 Surgery   RIGHT BREAST LUMPECTOMY WITH RADIOACTIVE SEED AND  RIGHT SENTINEL LYMPH NODE BIOPSY by Dr. Excell Burns  09/02/18    09/02/2018 Pathology Results   Diagnosis 1. Breast, lumpectomy, Right w/seed - INVASIVE DUCTAL CARCINOMA WITH EXTRACELLULAR MUCIN, GRADE I, 2.1 CM. - DUCTAL CARCINOMA IN SITU, INTERMEDIATE NUCLEAR GRADE. - INVASIVE CARCINOMA IS 2 MM FROM SUPERIOR MARGIN AND 2.5 MM FROM LATERAL MARGIN. - DUCTAL CARCINOMA IN SITU IS LESS THAN 1 MM FROM ANTERIOR MARGIN, 4 MM FROM SUPERIOR AND LATERAL MARGINS, 5 MM FROM INFERIOR MARGIN AND 8 MM FROM THE POSTERIOR MARGIN. - SMALL INTRADUCTAL PAPILLOMA. - NO EVIDENCE OF LYMPHOVASCULAR OR PERINEURAL INVASION. - BIOPSY SITE CHANGES. - SEE ONCOLOGY TABLE. 2. Lymph node, sentinel, biopsy, Right Axillary - LYMPH NODE, NEGATIVE  FOR CARCINOMA (0/1). 3. Lymph node, sentinel, biopsy, Right - LYMPH NODE, NEGATIVE FOR CARCINOMA (0/1). 4. Lymph node, sentinel, biopsy, Right - METASTATIC BREAST CARCINOMA TO ONE LYMPH  NODE (1/1). - METASTATIC FOCUS MEASURES 0.4 CM IN GREATEST DIMENSION. - NO DEFINITE EVIDENCE OF EXTRANODAL EXTENSION.   09/02/2018 Miscellaneous   MammaPrint  09/02/18 Low risk with 10% average 10-year risk of recurrnce untreated  Mammaprint Index: +0.287 97.8% of patients treated with hormonal therapy have no distnat recurrance.    09/02/2018 Cancer Staging   Staging form: Breast, AJCC 8th Edition - Pathologic stage from 09/02/2018: Stage IA (pT2, pN1a, cM0, G1, ER+, PR+, HER2-) - Signed by Mariah Merle, MD on 09/29/2018   10/05/2018 - 01/2019 Anti-estrogen oral therapy   Anastrozole 59m daily starting 10/05/18; stopped 5/18 due to insomnia, hot flashes, depression, and vaginal irritation.  I switched her Exemestane in 01/2019. She started having dizziness, hot flashes and joint pain so she stopped two days later.  -She tried for no more than 3 weeks total   11/12/2018 - 12/30/2018 Radiation Therapy   RT 11/12/18-12/30/18 with Dr. KEarney Hamburg   05/14/2019 Survivorship   SCP per LCira Rue NP    06/2019 - 06/2019 Anti-estrogen oral therapy   Tamoxifen 255mdaily starting 06/2019 but stopped after a few days due to dizziness, nausea and fatigue.       CURRENT THERAPY:  Surveillance   INTERVAL HISTORY:  FaARDYTH KELSOs here for a follow up of right breast cancer. She was last seen by me 6 months ago. She notes she is having skin itching diffusely in her back, arms, hands, legs. She denies skin rash and the scratching helps stop itch. She notes MSK related back pain occasionally based on activity and occasional side pain. She notes she is exercising 5 days a week and will go to classes during the week. Her energy level is adequate for her age. I reviewed her medication list with her. She takes Vit D3 and calcium. Her BG was doing well at time of her recent PCP f/u. She notes she is occasionally dizzy but notes to being hydrated enough.    REVIEW OF SYSTEMS:   Constitutional: Denies fevers,  chills or abnormal weight loss Eyes: Denies blurriness of vision Ears, nose, mouth, throat, and face: Denies mucositis or sore throat Respiratory: Denies cough, dyspnea or wheezes Cardiovascular: Denies palpitation, chest discomfort or lower extremity swelling Gastrointestinal:  Denies nausea, heartburn or change in bowel habits Skin: Denies abnormal skin rashes (+) Skin itching  Lymphatics: Denies new lymphadenopathy or easy bruising Neurological:Denies numbness, tingling or new weaknesses Behavioral/Psych: Mood is stable, no new changes  All other systems were reviewed with the patient and are negative.  MEDICAL HISTORY:  Past Medical History:  Diagnosis Date  . Anxiety   [place]   [place] IDC  . Hyperlipidemia   . Hypertension   . OSA (obstructive sleep apnea)    NPSG 03/08/2010  AHI 19.8/hr, CPAP nightly  . Osteopenia   . Pre-diabetes   . 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 BREAST LUMPECTOMY WITH RADIOACTIVE SEED AND SENTINEL LYMPH NODE BIOPSY Right 09/02/2018   Procedure: RIGHT BREAST LUMPECTOMY WITH RADIOACTIVE SEED AND  RIGHT SENTINEL LYMPH NODE BIOPSY;  Surgeon: HoExcell SeltzerMD;  Location: [facility];  Service: General;  Laterality: Right;  . BRONCHOSCOPY     dx'd sarcoid  . KNEE ARTHROPLASTY Left 03/13/2017  . NEUROPLASTY / TRANSPOSITION ULNAR NERVE AT ELBOW  09/2011  . TOTAL ABDOMINAL HYSTERECTOMY      I have reviewed the social history and family history with the patient and they are unchanged from previous note.  ALLERGIES:  is allergic to fosamax [alendronate sodium], omeprazole, motrin [ibuprofen], and fluvastatin.  MEDICATIONS:  Current Outpatient Medications  Medication Sig Dispense Refill  . aspirin EC 81 MG tablet Take 81 mg by mouth daily.  Swallow whole.    . Cholecalciferol (VITAMIN D-3 PO) Take 1,000 Units by mouth daily.    . famotidine (PEPCID) 40 MG tablet     . felodipine (PLENDIL) 10 MG 24 hr tablet TAKE 1 TABLET BY MOUTH EVERY DAY 90 tablet 2  . fish oil-omega-3 fatty acids 1000 MG capsule Take 2 capsules by mouth daily.      . Fluocinolone Acetonide Scalp 0.01 % OIL Apply to affected area of scalp as directed 1-2 x/day as needed for itching 118.28 mL 1  . GLUCOSAMINE-CHONDROITIN PO Take 2 tablets by mouth daily.      Marland Kitchen L-Methylfolate-Algae-B12-B6 (METANX) 3-90.314-2-35 MG CAPS Take 1 capsule by mouth 2 (two) times daily.     . Menthol, Topical Analgesic, (BIOFREEZE ROLL-ON EX) Apply 1 application topically as needed (pain in back,neck,thumb).    . mometasone (NASONEX) 50 MCG/ACT nasal spray Place 2 sprays into the nose daily. As needed    . nitroGLYCERIN (NITROSTAT) 0.4 MG SL tablet Place 1 tablet (0.4 mg total) under the tongue every 5 (five) minutes as needed for chest pain. 25 tablet 2  . OVER THE COUNTER MEDICATION as needed.    . Polyvinyl Alcohol-Povidone PF (REFRESH) 1.4-0.6 % SOLN Place 1 drop into both eyes daily as needed (dry eyes).     . pravastatin (PRAVACHOL) 40 MG tablet TAKE 1 TABLET BY MOUTH ON MONDAY, WEDNESDAY, AND FRIDAY 90 tablet 1   No current facility-administered medications for this visit.    PHYSICAL EXAMINATION: ECOG PERFORMANCE STATUS: 0 - Asymptomatic  No vitals taken today, Exam not performed today   LABORATORY DATA:  I have reviewed the data as listed CBC Latest Ref Rng & Units 08/12/2019 11/04/2018 08/13/2018  WBC 4.0 - 10.5 K/uL 3.6(L) 4.4 4.6  Hemoglobin 12.0 - 15.0 g/dL 11.8(L) 11.7(L) 12.0  Hematocrit 36 - 46 % 35.4(L) 36.2 37.4  Platelets 150 - 400 K/uL 168 194 204     CMP Latest Ref Rng & Units 08/12/2019 06/11/2019 11/04/2018  Glucose 70 - 99 mg/dL 90 96 92  BUN 8 - 23 mg/dL 12 14 12   Creatinine 0.44 - 1.00 mg/dL 0.94 0.90 0.96  Sodium 135 - 145 mmol/L 144 142 142  Potassium  3.5 - 5.1 mmol/L 3.5 3.7 3.5  Chloride 98 - 111 mmol/L 107 103 106  CO2 22 - 32 mmol/L 29 23 27   Calcium 8.9 - 10.3 mg/dL 9.5 9.9 9.8  Total Protein 6.5 - 8.1 g/dL 7.7 7.6 7.8  Total Bilirubin 0.3 - 1.2 mg/dL 0.4 0.3 0.4  Alkaline Phos 38 - 126 U/L 66 79 73  AST 15 - 41 U/L 20 23 20   ALT 0 - 44 U/L 15 18 16       RADIOGRAPHIC STUDIES: I have personally reviewed the radiological images as listed and agreed with the findings in the report. No results found.   ASSESSMENT & PLAN:  Mariah Skains  SARYNA Burns is a 75 y.o. female with    1. Malignant neoplasm of upper-outer quadrant of right breast, Stage IA,pT2N1aM0, ER/PR+, HER2-, Grade I, Mammaprint low risk -She was diagnosed in 08/2018. She is s/p right lumpectomy with SLNB on 3/31/20and is s/p adjuvant Radiation therapy. -Her mammaprint showed low risk of distantrecurrence.Adjuvant chemo was not recommended.  -Given her ER/PR+ disease,she has tried antiestrogen therapy with Anastrozole, Exemestane and most recently Tamoxifen over 3-4 months total. Given poor toleration she is fine to continue surveillance alone given her low risk of recurrence.  -Her 08/12/19 Breast Exam showed 2cm mass at 7-8:00 position right breast which has been present since surgery and not reported on 08/2019 Mammogram. Will monitor.  -She is clinically doing well and stable with no concern for her breast cancer recurrence. Will continue surveillance. Next mammogram in 08/2020.  -For her recent diffuse skin itching without rash, she can try Vaseline. For her dizziness I recommend checking her BP and remain hydrated.  -F/u in 6 months.   2.Osteopenia -12/2018 DEXA shows osteopenia with lowest T-score -1.3 at left femur neck.She has a estimated fracture risk is 4.5% in the next 10 and 0.7% risk of hip fracture in the next 10 years. Repeat DEXA in 2022. -Continue 2000 unit Vitamin D3 and oral calcium and weight bearing exercise.  -She is no longer on AI due to poor  tolerance.  3. Cancer Screening, Genetic Testing has not been perused yet, pt will think about it.  -Her last colonoscopy was in 2018 and benign adenomas were removed. Will repeat in 2023.  -I encouraged her to stay up to date on her cancer screening and vaccines. She has received her COVID19 vaccine series.   4. HTN and DM -overall controlled. Continue medications and continue to f/u with PCP -She exercised 5 days a week with adequate energy. Her weight remains stable. I discussed healthy balanced diet to help.    PLAN: -Mammogram in 08/2020 -Lab and F/u in 6 months    No problem-specific Assessment & Plan notes found for this encounter.   No orders of the defined types were placed in this encounter.  I discussed the assessment and treatment plan with the patient. The patient was provided an opportunity to ask questions and all were answered. The patient agreed with the plan and demonstrated an understanding of the instructions.  The patient was advised to call back or seek an in-person evaluation if the symptoms worsen or if the condition fails to improve as anticipated.  The total time spent in the appointment was 18 minutes.    Mariah Merle, MD 02/19/2020   I, Joslyn Devon, am acting as scribe for Mariah Merle, MD.   I have reviewed the above documentation for accuracy and completeness, and I agree with the above.

## 2020-02-19 ENCOUNTER — Encounter: Payer: Self-pay | Admitting: Hematology

## 2020-02-19 ENCOUNTER — Inpatient Hospital Stay: Payer: Medicare PPO | Attending: Hematology | Admitting: Hematology

## 2020-02-19 DIAGNOSIS — Z170 Estrogen receptor positive status [ER+]: Secondary | ICD-10-CM | POA: Diagnosis not present

## 2020-02-19 DIAGNOSIS — C50411 Malignant neoplasm of upper-outer quadrant of right female breast: Secondary | ICD-10-CM | POA: Diagnosis not present

## 2020-02-22 ENCOUNTER — Telehealth: Payer: Self-pay | Admitting: Hematology

## 2020-02-22 NOTE — Telephone Encounter (Signed)
Pt already scheduled in 6 months per old LOS. Nothing else to be scheduled per 9/17 los

## 2020-02-23 ENCOUNTER — Ambulatory Visit: Payer: Medicare PPO | Admitting: Internal Medicine

## 2020-02-23 ENCOUNTER — Encounter: Payer: Self-pay | Admitting: Internal Medicine

## 2020-02-23 ENCOUNTER — Other Ambulatory Visit: Payer: Self-pay

## 2020-02-23 VITALS — BP 130/72 | HR 60 | Temp 98.1°F | Ht <= 58 in | Wt 147.2 lb

## 2020-02-23 DIAGNOSIS — N182 Chronic kidney disease, stage 2 (mild): Secondary | ICD-10-CM | POA: Diagnosis not present

## 2020-02-23 DIAGNOSIS — Z0000 Encounter for general adult medical examination without abnormal findings: Secondary | ICD-10-CM

## 2020-02-23 DIAGNOSIS — I129 Hypertensive chronic kidney disease with stage 1 through stage 4 chronic kidney disease, or unspecified chronic kidney disease: Secondary | ICD-10-CM | POA: Diagnosis not present

## 2020-02-23 DIAGNOSIS — Z23 Encounter for immunization: Secondary | ICD-10-CM

## 2020-02-23 DIAGNOSIS — E6609 Other obesity due to excess calories: Secondary | ICD-10-CM

## 2020-02-23 DIAGNOSIS — R413 Other amnesia: Secondary | ICD-10-CM | POA: Diagnosis not present

## 2020-02-23 DIAGNOSIS — E1122 Type 2 diabetes mellitus with diabetic chronic kidney disease: Secondary | ICD-10-CM | POA: Diagnosis not present

## 2020-02-23 DIAGNOSIS — M25512 Pain in left shoulder: Secondary | ICD-10-CM

## 2020-02-23 DIAGNOSIS — E559 Vitamin D deficiency, unspecified: Secondary | ICD-10-CM | POA: Diagnosis not present

## 2020-02-23 DIAGNOSIS — Z6830 Body mass index (BMI) 30.0-30.9, adult: Secondary | ICD-10-CM

## 2020-02-23 DIAGNOSIS — L819 Disorder of pigmentation, unspecified: Secondary | ICD-10-CM | POA: Diagnosis not present

## 2020-02-23 LAB — POCT UA - MICROALBUMIN
Albumin/Creatinine Ratio, Urine, POC: <30
Creatinine, POC: 200 mg/dL
Microalbumin Ur, POC: 30 mg/L

## 2020-02-23 LAB — POCT URINALYSIS DIPSTICK
Bilirubin, UA: NEGATIVE
Blood, UA: NEGATIVE
Glucose, UA: NEGATIVE
Ketones, UA: NEGATIVE
Nitrite, UA: NEGATIVE
Protein, UA: NEGATIVE
Spec Grav, UA: 1.02 (ref 1.010–1.025)
Urobilinogen, UA: 0.2 E.U./dL
pH, UA: 6.5 (ref 5.0–8.0)

## 2020-02-23 NOTE — Progress Notes (Signed)
I,Tianna Badgett,acting as a Education administrator for [name], MD.,have documented all relevant documentation on the behalf of [name], MD,as directed by  [name], MD while in the presence of [name], MD.  This visit occurred during the SARS-CoV-2 public health emergency.  Safety protocols were in place, including screening questions prior to the visit, additional usage of staff PPE, and extensive cleaning of exam room while observing appropriate contact time as indicated for disinfecting solutions.  Subjective:     Patient ID: Mariah Burns , female    DOB: January 09, 1946 , 74 y.o.   MRN: [id number]   Chief Complaint  Patient presents with  . Annual Exam  . Diabetes  . Hypertension    HPI  She is here today for a full physical examination. She states that she is compliant with medications most of the time. She does not want to get an EKG today since she just had one performed at her cardiologist on 02/09/2020  Diabetes She presents for her follow-up diabetic visit. She has type 2 diabetes mellitus. Her disease course has been stable. There are no hypoglycemic associated symptoms. Pertinent negatives for diabetes include no blurred vision. There are no hypoglycemic complications. Diabetic complications include nephropathy. Risk factors for coronary artery disease include diabetes mellitus, dyslipidemia, hypertension, sedentary lifestyle and post-menopausal. She is compliant with treatment most of the time. She is following a diabetic diet. She participates in exercise intermittently. Her home blood glucose trend is fluctuating minimally. Her breakfast blood glucose is taken between 8-9 am. Her breakfast blood glucose range is generally 90-110 mg/dl. Eye exam is not current.  Hypertension This is a chronic problem. The current episode started more than 1 year ago. The problem has been gradually improving since onset. The problem is controlled. Pertinent negatives include no  blurred vision. The current treatment provides moderate improvement. Hypertensive end-organ damage includes kidney disease.     Past Medical History:  Diagnosis Date  . Anxiety   . 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 Hyperlipidemia   . Hypertension   . OSA (obstructive sleep apnea)    NPSG 03/08/2010  AHI 19.8/hr, CPAP nightly  . Osteopenia   . Pre-diabetes   . 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 Allergies Sister   . Heart disease Sister   . Lupus Sister   . Allergies Mother   . Allergies Daughter   . Healthy Daughter   . Heart disease Sister   . Gout Brother   . Hypertension Brother   . Heart disease Brother   . Heart failure Father   . Gout Brother   . Hypertension Brother   . Hypertension Brother   . Hypertension Brother   . Post-traumatic stress disorder Brother   . Healthy Daughter      Current Outpatient Medications:  .  aspirin EC 81 MG tablet, Take 81 mg by mouth daily. Swallow whole., Disp: , Rfl:  .  Cholecalciferol (VITAMIN D-3 PO), Take 1,000 Units by mouth daily., Disp: , Rfl:  .  famotidine (PEPCID) 40 MG tablet, , Disp: , Rfl:  .  felodipine (PLENDIL) 10 MG 24 hr tablet, TAKE 1 TABLET BY MOUTH EVERY DAY, Disp: 90 tablet, Rfl: 2 .  fish oil-omega-3 fatty acids 1000 MG capsule, Take  2 capsules by mouth daily.  , Disp: , Rfl:  .  Fluocinolone Acetonide Scalp 0.01 % OIL, Apply to affected area of scalp as directed 1-2 x/day as needed for itching, Disp: 118.28 mL, Rfl: 1 .  GLUCOSAMINE-CHONDROITIN PO, Take 2 tablets by mouth daily.  , Disp: , Rfl:  .  L-Methylfolate-Algae-B12-B6 (METANX) 3-90.314-2-35 MG CAPS, Take 1 capsule by mouth 2 (two) times daily. , Disp: , Rfl:  .  Menthol, Topical Analgesic, (BIOFREEZE ROLL-ON EX), Apply 1 application topically as needed (pain in back,neck,thumb)., Disp: ,  Rfl:  .  mometasone (NASONEX) 50 MCG/ACT nasal spray, Place 2 sprays into the nose daily. As needed, Disp: , Rfl:  .  nitroGLYCERIN (NITROSTAT) 0.4 MG SL tablet, Place 1 tablet (0.4 mg total) under the tongue every 5 (five) minutes as needed for chest pain., Disp: 25 tablet, Rfl: 2 .  OVER THE COUNTER MEDICATION, as needed., Disp: , Rfl:  .  Polyvinyl Alcohol-Povidone PF (REFRESH) 1.4-0.6 % SOLN, Place 1 drop into both eyes daily as needed (dry eyes). , Disp: , Rfl:  .  pravastatin (PRAVACHOL) 40 MG tablet, TAKE 1 TABLET BY MOUTH ON MONDAY, WEDNESDAY, AND FRIDAY, Disp: 90 tablet, Rfl: 1   Allergies  Allergen Reactions  . Fosamax [Alendronate Sodium] Other (See Comments)    REACTION: "CAUSE GI UPSET AND FATIGUE"  . Omeprazole Other (See Comments)    Lower ab pain  . Motrin [Ibuprofen] Other (See Comments)    GI upset- has to eat prior to taking  . Fluvastatin Other (See Comments)    mild memoryproblems of confusion       The patient states she uses post menopausal status for birth control. Last LMP was No LMP recorded. Patient has had a hysterectomy.. Negative for Dysmenorrhea. Negative for: breast discharge, breast lump(s), breast pain and breast self exam. Associated symptoms include abnormal vaginal bleeding. Pertinent negatives include abnormal bleeding (hematology), anxiety, decreased libido, depression, difficulty falling sleep, dyspareunia, history of infertility, nocturia, sexual dysfunction, sleep disturbances, urinary incontinence, urinary urgency, vaginal discharge and vaginal itching. Diet regular.The patient states her exercise level is  regular.   . The patient's tobacco use is:  Social History   Tobacco Use  Smoking Status Never Smoker  Smokeless Tobacco Never Used  . She has been exposed to passive smoke. The patient's alcohol use is:  Social History   Substance and Sexual Activity  Alcohol Use No    Review of Systems  Constitutional: Negative.   HENT: Negative.    Eyes: Negative.  Negative for blurred vision.  Respiratory: Negative.   Cardiovascular: Negative.   Gastrointestinal: Negative.   Endocrine: Negative.   Genitourinary: Negative.   Musculoskeletal: Positive for arthralgias.  Skin: Negative.        She c/o spots on bottom of feet. They do not itch. They do not bother her. Wants to know why they are there. Also c/o wrinkly fingertips. This occurs even when not submerged in water.   Allergic/Immunologic: Negative.   Neurological: Negative.        She c/o changes in her memory. Not sure if it is a problem yet. Thinks she is more forgetful. Denies change in sleep/eating habits.   Hematological: Negative.   Psychiatric/Behavioral: Negative.      Today's Vitals   02/23/20 1117  BP: 130/72  Pulse: 60  Temp: 98.1 F (36.7 C)  TempSrc: Oral  Weight: 147 lb 3.2 oz (66.8 kg)  Height: 4' 9.8" (1.468 m)   Body mass  index is 30.98 kg/m.   Objective:  Physical Exam Vitals and nursing note reviewed.  Constitutional:      Appearance: Normal appearance. She is obese.  HENT:     Head: Normocephalic and atraumatic.     Right Ear: Tympanic membrane, ear canal and external ear normal.     Left Ear: Tympanic membrane, ear canal and external ear normal.     Nose:     Comments: Deferred, masked    Mouth/Throat:     Comments: Deferred, masked Eyes:     Extraocular Movements: Extraocular movements intact.     Conjunctiva/sclera: Conjunctivae normal.     Pupils: Pupils are equal, round, and reactive to light.  Cardiovascular:     Rate and Rhythm: Normal rate and regular rhythm.     Pulses: Normal pulses.          Dorsalis pedis pulses are 2+ on the right side and 2+ on the left side.     Heart sounds: Normal heart sounds.  Pulmonary:     Effort: Pulmonary effort is normal.     Breath sounds: Normal breath sounds.  Chest:     Breasts: Tanner Score is 5.        Right: Normal.        Left: Normal.     Comments: Healed surgical scar on  right. Mass on right breast - about 7/8 oclock position. nontender to palpation Abdominal:     General: Bowel sounds are normal.     Palpations: Abdomen is soft.  Genitourinary:    Comments: deferred Musculoskeletal:        General: Tenderness present. Normal range of motion.     Cervical back: Normal range of motion and neck supple.  Feet:     Right foot:     Protective Sensation: 5 sites tested. 5 sites sensed.     Skin integrity: Dry skin present.     Toenail Condition: Right toenails are normal.     Left foot:     Protective Sensation: 5 sites tested. 5 sites sensed.     Skin integrity: Dry skin present.     Toenail Condition: Left toenails are normal.     Comments: Scattered areas of hyperpigmentation on soles of feet b/l -- some are darker than others. Skin:    General: Skin is warm and dry.  Neurological:     General: No focal deficit present.     Mental Status: She is alert and oriented to person, place, and time.  Psychiatric:        Mood and Affect: Mood normal.        Behavior: Behavior normal.         Assessment And Plan:     1. Routine general medical examination at health care facility Comments: A full exam was performed. Importance of monthly self breast exams was discussed with the patient. PATIENT IS ADVISED TO GET 30-45 MINUTES REGULAR EXERCISE NO LESS THAN FOUR TO FIVE DAYS PER WEEK - BOTH WEIGHTBEARING EXERCISES AND AEROBIC ARE RECOMMENDED.  PATIENT IS ADVISED TO FOLLOW A HEALTHY DIET WITH AT LEAST SIX FRUITS/VEGGIES PER DAY, DECREASE INTAKE OF RED MEAT, AND TO INCREASE FISH INTAKE TO TWO DAYS PER WEEK.  MEATS/FISH SHOULD NOT BE FRIED, BAKED OR BROILED IS PREFERABLE.  I SUGGEST WEARING SPF 50 SUNSCREEN ON EXPOSED PARTS AND ESPECIALLY WHEN IN THE DIRECT SUNLIGHT FOR AN EXTENDED PERIOD OF TIME.  PLEASE AVOID FAST FOOD RESTAURANTS AND INCREASE YOUR WATER INTAKE.  2. Type 2 diabetes mellitus with stage 2 chronic kidney disease, without long-term current use of  insulin ([facility]) Comments: Diabetic foot exam was performed. I DISCUSSED WITH THE PATIENT AT LENGTH REGARDING THE GOALS OF GLYCEMIC CONTROL AND POSSIBLE LONG-TERM COMPLICATIONS.  I  ALSO STRESSED THE IMPORTANCE OF COMPLIANCE WITH HOME GLUCOSE MONITORING, DIETARY RESTRICTIONS INCLUDING AVOIDANCE OF SUGARY DRINKS/PROCESSED FOODS,  ALONG WITH REGULAR EXERCISE.  I  ALSO STRESSED THE IMPORTANCE OF ANNUAL EYE EXAMS, SELF FOOT CARE AND COMPLIANCE WITH OFFICE VISITS.  - CBC - Hemoglobin A1c - CMP14+EGFR - Lipid panel - POCT UA - Microalbumin - POCT urinalysis dipstick  3. Hypertensive nephropathy Comments: Chronic, controlled. She will continue with current meds. She is encouraged to follow low sodium diet. EKG not performed, recent EKG reviewed in full detail. She will f/u in six months for re-evaluation.   4. Acute pain of left shoulder Comments: Advised to apply topical pain cream to affected area prn. She will let me know if her sx persist. If needed, I will refer to Ortho for further evaluation.   5. Hyperpigmented skin lesion Comments: She agrees to [facility] referral. Regarding wrinkly fingertips - she is encouraged to stay well hydrated.  - Ambulatory referral to Dermatology  6. Memory changes Comments: I will check RPR. I did review recent thyroid labs, no need for further testing at this time.  Will consider Neuro eval.  - RPR  7. Vitamin D deficiency Comments: I will check vitamin D level and supplement as needed.   8. Class 1 obesity due to excess calories with serious comorbidity and body mass index (BMI) of 30.0 to 30.9 in adult Comments: Her BMI is acceptable for her demographic. Encouraged to aim for at least 150 minutes of exercise per week.   9. Immunization due Comments: She was given high dose flu vaccine today.  - Flu Vaccine QUAD High Dose(Fluad)   Patient was given opportunity to ask questions. Patient verbalized understanding of the plan and was able to repeat key elements  of the plan. All questions were answered to their satisfaction.   [name], MD   I, [name], MD, have reviewed all documentation for this visit. The documentation on 02/28/20 for the exam, diagnosis, procedures, and orders are all accurate and complete.  THE PATIENT IS ENCOURAGED TO PRACTICE SOCIAL DISTANCING DUE TO THE COVID-19 PANDEMIC.

## 2020-02-23 NOTE — Patient Instructions (Signed)
Health Maintenance After Age 75 After age 75, you are at a higher risk for certain long-term diseases and infections as well as injuries from falls. Falls are a major cause of broken bones and head injuries in people who are older than age 75. Getting regular preventive care can help to keep you healthy and well. Preventive care includes getting regular testing and making lifestyle changes as recommended by your health care provider. Talk with your health care provider about:  Which screenings and tests you should have. A screening is a test that checks for a disease when you have no symptoms.  A diet and exercise plan that is right for you. What should I know about screenings and tests to prevent falls? Screening and testing are the best ways to find a health problem early. Early diagnosis and treatment give you the best chance of managing medical conditions that are common after age 75. Certain conditions and lifestyle choices may make you more likely to have a fall. Your health care provider may recommend:  Regular vision checks. Poor vision and conditions such as cataracts can make you more likely to have a fall. If you wear glasses, make sure to get your prescription updated if your vision changes.  Medicine review. Work with your health care provider to regularly review all of the medicines you are taking, including over-the-counter medicines. Ask your health care provider about any side effects that may make you more likely to have a fall. Tell your health care provider if any medicines that you take make you feel dizzy or sleepy.  Osteoporosis screening. Osteoporosis is a condition that causes the bones to get weaker. This can make the bones weak and cause them to break more easily.  Blood pressure screening. Blood pressure changes and medicines to control blood pressure can make you feel dizzy.  Strength and balance checks. Your health care provider may recommend certain tests to check your  strength and balance while standing, walking, or changing positions.  Foot health exam. Foot pain and numbness, as well as not wearing proper footwear, can make you more likely to have a fall.  Depression screening. You may be more likely to have a fall if you have a fear of falling, feel emotionally low, or feel unable to do activities that you used to do.  Alcohol use screening. Using too much alcohol can affect your balance and may make you more likely to have a fall. What actions can I take to lower my risk of falls? General instructions  Talk with your health care provider about your risks for falling. Tell your health care provider if: ? You fall. Be sure to tell your health care provider about all falls, even ones that seem minor. ? You feel dizzy, sleepy, or off-balance.  Take over-the-counter and prescription medicines only as told by your health care provider. These include any supplements.  Eat a healthy diet and maintain a healthy weight. A healthy diet includes low-fat dairy products, low-fat (lean) meats, and fiber from whole grains, beans, and lots of fruits and vegetables. Home safety  Remove any tripping hazards, such as rugs, cords, and clutter.  Install safety equipment such as grab bars in bathrooms and safety rails on stairs.  Keep rooms and walkways well-lit. Activity   Follow a regular exercise program to stay fit. This will help you maintain your balance. Ask your health care provider what types of exercise are appropriate for you.  If you need a cane or   walker, use it as recommended by your health care provider.  Wear supportive shoes that have nonskid soles. Lifestyle  Do not drink alcohol if your health care provider tells you not to drink.  If you drink alcohol, limit how much you have: ? 0-1 drink a day for women. ? 0-2 drinks a day for men.  Be aware of how much alcohol is in your drink. In the [place] one drink equals one typical bottle of beer (12  oz), one-half glass of wine (5 oz), or one shot of hard liquor (1 oz).  Do not use any products that contain nicotine or tobacco, such as cigarettes and e-cigarettes. If you need help quitting, ask your health care provider. Summary  Having a healthy lifestyle and getting preventive care can help to protect your health and wellness after age 75.  Screening and testing are the best way to find a health problem early and help you avoid having a fall. Early diagnosis and treatment give you the best chance for managing medical conditions that are more common for people who are older than age 75.  Falls are a major cause of broken bones and head injuries in people who are older than age 75. Take precautions to prevent a fall at home.  Work with your health care provider to learn what changes you can make to improve your health and wellness and to prevent falls. This information is not intended to replace advice given to you by your health care provider. Make sure you discuss any questions you have with your health care provider. Document Revised: 09/11/2018 Document Reviewed: 04/03/2017 Elsevier Patient Education  2020 [employer]  

## 2020-02-24 LAB — LIPID PANEL
Chol/HDL Ratio: 3.2 ratio (ref 0.0–4.4)
Cholesterol, Total: 158 mg/dL (ref 100–199)
HDL: 50 mg/dL (ref >39)
LDL Chol Calc (NIH): 76 mg/dL (ref 0–99)
Triglycerides: 193 mg/dL — ABNORMAL HIGH (ref 0–149)
VLDL Cholesterol Cal: 32 mg/dL (ref 5–40)

## 2020-02-24 LAB — CMP14+EGFR
ALT: 15 IU/L (ref 0–32)
AST: 20 IU/L (ref 0–40)
Albumin/Globulin Ratio: 1.5 (ref 1.2–2.2)
Albumin: 4.4 g/dL (ref 3.7–4.7)
Alkaline Phosphatase: 78 IU/L (ref 44–121)
BUN/Creatinine Ratio: 15 (ref 12–28)
BUN: 12 mg/dL (ref 8–27)
Bilirubin Total: 0.3 mg/dL (ref 0.0–1.2)
CO2: 27 mmol/L (ref 20–29)
Calcium: 9.7 mg/dL (ref 8.7–10.3)
Chloride: 102 mmol/L (ref 96–106)
Creatinine, Ser: 0.81 mg/dL (ref 0.57–1.00)
GFR calc Af Amer: 83 mL/min/{1.73_m2} (ref >59)
GFR calc non Af Amer: 72 mL/min/{1.73_m2} (ref >59)
Globulin, Total: 2.9 g/dL (ref 1.5–4.5)
Glucose: 91 mg/dL (ref 65–99)
Potassium: 3.4 mmol/L — ABNORMAL LOW (ref 3.5–5.2)
Sodium: 143 mmol/L (ref 134–144)
Total Protein: 7.3 g/dL (ref 6.0–8.5)

## 2020-02-24 LAB — CBC
Hematocrit: 37 % (ref 34.0–46.6)
Hemoglobin: 12 g/dL (ref 11.1–15.9)
MCH: 28.6 pg (ref 26.6–33.0)
MCHC: 32.4 g/dL (ref 31.5–35.7)
MCV: 88 fL (ref 79–97)
Platelets: 178 10*3/uL (ref 150–450)
RBC: 4.2 x10E6/uL (ref 3.77–5.28)
RDW: 13.4 % (ref 11.7–15.4)
WBC: 3.8 10*3/uL (ref 3.4–10.8)

## 2020-02-24 LAB — HEMOGLOBIN A1C
Est. average glucose Bld gHb Est-mCnc: 120 mg/dL
Hgb A1c MFr Bld: 5.8 % — ABNORMAL HIGH (ref 4.8–5.6)

## 2020-02-24 LAB — RPR: RPR Ser Ql: NONREACTIVE

## 2020-02-25 ENCOUNTER — Telehealth: Payer: Self-pay

## 2020-02-25 NOTE — Telephone Encounter (Signed)
-----   Message from Glendale Chard, MD sent at 02/25/2020  2:25 PM EDT ----- Your triglycerides are elevated, were you fasting at your visit? It is important to aim for at least 150 minutes of exercise per week. LDL, bad chol is 76- goal lis less than 70. Blood cout is normal. Hba1c is great at 5.8. Liver and kidney function are stable. Potassium level is low. Syphilis test is neg.

## 2020-02-25 NOTE — Telephone Encounter (Signed)
Left the patient a message to call back for lab results. 

## 2020-02-26 ENCOUNTER — Ambulatory Visit: Payer: Medicare PPO | Admitting: Interventional Cardiology

## 2020-02-28 ENCOUNTER — Encounter: Payer: Self-pay | Admitting: Internal Medicine

## 2020-02-29 ENCOUNTER — Ambulatory Visit (HOSPITAL_BASED_OUTPATIENT_CLINIC_OR_DEPARTMENT_OTHER): Payer: Medicare PPO | Attending: Internal Medicine | Admitting: Internal Medicine

## 2020-02-29 DIAGNOSIS — G4733 Obstructive sleep apnea (adult) (pediatric): Secondary | ICD-10-CM

## 2020-03-01 ENCOUNTER — Other Ambulatory Visit: Payer: Self-pay

## 2020-03-24 ENCOUNTER — Encounter: Payer: Self-pay | Admitting: Internal Medicine

## 2020-03-28 ENCOUNTER — Encounter: Payer: Self-pay | Admitting: Internal Medicine

## 2020-03-28 ENCOUNTER — Ambulatory Visit: Payer: Medicare PPO | Admitting: Internal Medicine

## 2020-03-28 ENCOUNTER — Other Ambulatory Visit: Payer: Self-pay

## 2020-03-28 VITALS — BP 128/62 | HR 73 | Temp 97.4°F | Ht <= 58 in | Wt 147.2 lb

## 2020-03-28 DIAGNOSIS — G4733 Obstructive sleep apnea (adult) (pediatric): Secondary | ICD-10-CM

## 2020-03-28 DIAGNOSIS — D869 Sarcoidosis, unspecified: Secondary | ICD-10-CM

## 2020-03-28 NOTE — Progress Notes (Signed)
Subjective:    Patient ID: [name], female    DOB: 10/05/1945, 74 y.o.   MRN: [id number]  HPI female never smoker followed for OSA, Sarcoid, complicated by HBP, DM NPSG 03/08/2010  AHI 19.8/hr  Office spirometry 09/20/15- WNL-FEV1/FVC 0.85 HST 12/24/19- AHI 12.1/ hr, desaturation to 88%, bodyweight 150 lbs ------------------------------------------------------------------------------------------ 11/26/19- 74 year old female never smoker followed for OSA, Sarcoid, complicated by HBP, DM, Cancer R breast XRT, CPAP auto 5-15/ [facility] compliance 70%, AHI 1.1/ hr Body weight today- 150 lbs Tried different masks. Feels "too much air". Asks if she still needs CPAP.  03/28/20- 74 year old female never smoker followed for OSA, Sarcoid, complicated by HBP, DM, Cancer R breast XRT, CPAP auto 4-10/ [facility]- compliance 97%, AHI 1.2/ hr Body weight today-147 HST 12/24/19- AHI 12.1/ hr, desaturation to 88%, bodyweight 150 lbs CPAP 4-10/ [facility] compliance 97%, AHI 1.2/ hr Covid vax- 2 Phizer Flu vax- had CXR 11/26/19- IMPRESSION: 1. Stable scarring compatible with history of sarcoidosis. No acute process.  Review of Systems-see HPI  + = positive Constitutional:   No-   weight loss, night sweats, fevers, chills, +fatigue, lassitude. HEENT:   No-  headaches, difficulty swallowing, tooth/dental problems, sore throat,       No-  sneezing, itching, ear ache, nasal congestion, post nasal drip,  CV:  No-   chest pain, orthopnea, PND, swelling in lower extremities, anasarca, dizziness, palpitations Resp: +shortness of breath with exertion or at rest.              No-   productive cough,  No non-productive cough,  No- coughing up of blood.              No-   change in color of mucus.  No- wheezing.   Skin: No-   rash or lesions. GI:  No-   heartburn, indigestion, abdominal pain, nausea, vomiting,  GU:  MS:  No-   joint pain or swelling.   Neuro-      nothing unusual Psych:  No- change in mood or affect. No depression or anxiety.  No memory loss.  Objective:   Physical Exam    General- Alert, Oriented, Affect-appropriate, Distress- none acute; +medium build Skin- rash-none, lesions- none, excoriation- none Lymphadenopathy- none Head- atraumatic            Eyes- Gross vision intact, PERRLA, conjunctivae clear secretions            Ears- Hearing, canals-normal            Nose- Clear, no-Septal dev, mucus, polyps, erosion, perforation             Throat- Mallampati III , mucosa- not very dry , drainage- none, tonsils- atrophic Neck- flexible , trachea midline, no stridor , thyroid nl, carotid no bruit Chest - symmetrical excursion , unlabored           Heart/CV- RRR ,  [id number]+6-0/1 systolic AS , no gallop  , no rub, nl s1 s2                           - JVD- none , edema- none, stasis changes- none, varices- none           Lung- clear to P&A, wheeze- none, cough- none , dullness-none, rub- none           Chest wall-  Abd-  Br/ Gen/ Rectal- Not done, not indicated Extrem- cyanosis- none,  clubbing, none, atrophy- none, strength- nl Neuro- grossly intact to observation

## 2020-03-28 NOTE — Patient Instructions (Signed)
Order- referral to sleep center for mask fitting/ desensitization  Order- referral to Dr Ron Parker orthodontist    Consider oral appliance as alternative to CPAP  Please call if we can help

## 2020-04-11 ENCOUNTER — Other Ambulatory Visit (HOSPITAL_BASED_OUTPATIENT_CLINIC_OR_DEPARTMENT_OTHER): Payer: Medicare PPO | Admitting: Internal Medicine

## 2020-04-16 ENCOUNTER — Encounter: Payer: Self-pay | Admitting: Internal Medicine

## 2020-04-16 NOTE — Assessment & Plan Note (Signed)
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

## 2020-04-16 NOTE — Assessment & Plan Note (Signed)
Benefits from CPAP and so far ccompliant, but very interested in alternatives- discussed. Plan- continue CPAP auto 4-110. Meanwhile, refer for mask-fiting and refer to Orthodontics, Dr Ron Parker to discuss oral appliance alternative.

## 2020-05-17 DIAGNOSIS — L603 Nail dystrophy: Secondary | ICD-10-CM | POA: Diagnosis not present

## 2020-05-17 DIAGNOSIS — L84 Corns and callosities: Secondary | ICD-10-CM | POA: Diagnosis not present

## 2020-05-17 DIAGNOSIS — M79674 Pain in right toe(s): Secondary | ICD-10-CM | POA: Diagnosis not present

## 2020-05-17 DIAGNOSIS — G603 Idiopathic progressive neuropathy: Secondary | ICD-10-CM | POA: Diagnosis not present

## 2020-05-19 DIAGNOSIS — G4733 Obstructive sleep apnea (adult) (pediatric): Secondary | ICD-10-CM | POA: Diagnosis not present

## 2020-05-24 ENCOUNTER — Encounter: Payer: Self-pay | Admitting: Internal Medicine

## 2020-05-26 ENCOUNTER — Encounter: Payer: Self-pay | Admitting: Internal Medicine

## 2020-05-26 ENCOUNTER — Telehealth: Payer: Self-pay

## 2020-05-26 ENCOUNTER — Other Ambulatory Visit: Payer: Self-pay

## 2020-05-26 ENCOUNTER — Ambulatory Visit: Payer: Medicare PPO | Admitting: Internal Medicine

## 2020-05-26 VITALS — BP 132/76 | HR 57 | Temp 98.2°F | Ht <= 58 in | Wt 146.4 lb

## 2020-05-26 DIAGNOSIS — Z6832 Body mass index (BMI) 32.0-32.9, adult: Secondary | ICD-10-CM | POA: Diagnosis not present

## 2020-05-26 DIAGNOSIS — M8580 Other specified disorders of bone density and structure, unspecified site: Secondary | ICD-10-CM | POA: Diagnosis not present

## 2020-05-26 DIAGNOSIS — E1122 Type 2 diabetes mellitus with diabetic chronic kidney disease: Secondary | ICD-10-CM | POA: Diagnosis not present

## 2020-05-26 DIAGNOSIS — I129 Hypertensive chronic kidney disease with stage 1 through stage 4 chronic kidney disease, or unspecified chronic kidney disease: Secondary | ICD-10-CM

## 2020-05-26 DIAGNOSIS — E6609 Other obesity due to excess calories: Secondary | ICD-10-CM | POA: Diagnosis not present

## 2020-05-26 DIAGNOSIS — N182 Chronic kidney disease, stage 2 (mild): Secondary | ICD-10-CM | POA: Diagnosis not present

## 2020-05-26 NOTE — Telephone Encounter (Signed)
The pt left a urine sample and I was calling her to let her know that she didn't need to leave a urine sample.

## 2020-05-26 NOTE — Patient Instructions (Addendum)
Drink ginger-turmeric tea daily to help with arthritis symptoms  You may use Aquaphor and Cetaphil for dry skin  Osteopenia  Osteopenia is a loss of thickness (density) inside of the bones. Another name for osteopenia is low bone mass. Mild osteopenia is a normal part of aging. It is not a disease, and it does not cause symptoms. However, if you have osteopenia and continue to lose bone mass, you could develop a condition that causes the bones to become thin and break more easily (osteoporosis). You may also lose some height, have back pain, and have a stooped posture. Although osteopenia is not a disease, making changes to your lifestyle and diet can help to prevent osteopenia from developing into osteoporosis. What are the causes? Osteopenia is caused by loss of calcium in the bones.  Bones are constantly changing. Old bone cells are continually being replaced with new bone cells. This process builds new bone. The mineral calcium is needed to build new bone and maintain bone density. Bone density is usually highest around age 40. After that, most people's bodies cannot replace all the bone they have lost with new bone. What increases the risk? You are more likely to develop this condition if:  You are older than age 11.  You are a woman who went through menopause early.  You have a long illness that keeps you in bed.  You do not get enough exercise.  You lack certain nutrients (malnutrition).  You have an overactive thyroid gland (hyperthyroidism).  You smoke.  You drink a lot of alcohol.  You are taking medicines that weaken the bones, such as steroids. What are the signs or symptoms? This condition does not cause any symptoms. You may have a slightly higher risk for bone breaks (fractures), so getting fractures more easily than normal may be an indication of osteopenia. How is this diagnosed? Your health care provider can diagnose this condition with a special type of X-ray  exam that measures bone density (dual-energy X-ray absorptiometry, DEXA). This test can measure bone density in your hips, spine, and wrists. Osteopenia has no symptoms, so this condition is usually diagnosed after a routine bone density screening test is done for osteoporosis. This routine screening is usually done for:  Women who are age 47 or older.  Men who are age 31 or older. If you have risk factors for osteopenia, you may have the screening test at an earlier age. How is this treated? Making dietary and lifestyle changes can lower your risk for osteoporosis. If you have severe osteopenia that is close to becoming osteoporosis, your health care provider may prescribe medicines and dietary supplements such as calcium and vitamin D. These supplements help to rebuild bone density. Follow these instructions at home:   Take over-the-counter and prescription medicines only as told by your health care provider. These include vitamins and supplements.  Eat a diet that is high in calcium and vitamin D. ? Calcium is found in dairy products, beans, salmon, and leafy green vegetables like spinach and broccoli. ? Look for foods that have vitamin D and calcium added to them (fortified foods), such as orange juice, cereal, and bread.  Do 30 or more minutes of a weight-bearing exercise every day, such as walking, jogging, or playing a sport. These types of exercises strengthen the bones.  Take precautions at home to lower your risk of falling, such as: ? Keeping rooms well-lit and free of clutter, such as cords. ? Installing safety rails on stairs. ?  Using rubber mats in the bathroom or other areas that are often wet or slippery.  Do not use any products that contain nicotine or tobacco, such as cigarettes and e-cigarettes. If you need help quitting, ask your health care provider.  Avoid alcohol or limit alcohol intake to no more than 1 drink a day for nonpregnant women and 2 drinks a day for men.  One drink equals 12 oz of beer, 5 oz of wine, or 1 oz of hard liquor.  Keep all follow-up visits as told by your health care provider. This is important. Contact a health care provider if:  You have not had a bone density screening for osteoporosis and you are: ? A woman, age 9 or older. ? A man, age 28 or older.  You are a postmenopausal woman who has not had a bone density screening for osteoporosis.  You are older than age 68 and you want to know if you should have bone density screening for osteoporosis. Summary  Osteopenia is a loss of thickness (density) inside of the bones. Another name for osteopenia is low bone mass.  Osteopenia is not a disease, but it may increase your risk for a condition that causes the bones to become thin and break more easily (osteoporosis).  You may be at risk for osteopenia if you are older than age 68 or if you are a woman who went through early menopause.  Osteopenia does not cause any symptoms, but it can be diagnosed with a bone density screening test.  Dietary and lifestyle changes are the first treatment for osteopenia. These may lower your risk for osteoporosis. This information is not intended to replace advice given to you by your health care provider. Make sure you discuss any questions you have with your health care provider. Document Revised: 05/03/2017 Document Reviewed: 02/27/2017 Elsevier Patient Education  2020 [employer].

## 2020-05-26 NOTE — Progress Notes (Signed)
I,[name],acting as a Education administrator for [name], MD.,have documented all relevant documentation on the behalf of [name], MD,as directed by  [name], MD while in the presence of [name], MD.  This visit occurred during the SARS-CoV-2 public health emergency.  Safety protocols were in place, including screening questions prior to the visit, additional usage of staff PPE, and extensive cleaning of exam room while observing appropriate contact time as indicated for disinfecting solutions.  Subjective:     Patient ID: Mariah Burns , female    DOB: 03-28-1946 , 74 y.o.   MRN: [id number]   Chief Complaint  Patient presents with  . Diabetes  . Hypertension    HPI  Diabetes She presents for her follow-up diabetic visit. She has type 2 diabetes mellitus. Her disease course has been stable. There are no hypoglycemic associated symptoms. Pertinent negatives for diabetes include no blurred vision. There are no hypoglycemic complications. Diabetic complications include nephropathy. Risk factors for coronary artery disease include diabetes mellitus, dyslipidemia, hypertension, sedentary lifestyle and post-menopausal. She is compliant with treatment most of the time.  Hypertension This is a chronic problem. The current episode started more than 1 year ago. The problem has been gradually improving since onset. The problem is controlled. Pertinent negatives include no blurred vision.     Past Medical History:  Diagnosis Date  . Anxiety   . Cancer ([place]) 08/2018   right breast IDC  . Hyperlipidemia   . Hypertension   . OSA (obstructive sleep apnea)    NPSG 03/08/2010  AHI 19.8/hr, CPAP nightly  . Osteopenia   . Pre-diabetes   . 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 Allergies Sister   . Heart disease  Sister   . Lupus Sister   . Allergies Mother   . Allergies Daughter   . Healthy Daughter   . Heart disease Sister   . Gout Brother   . Hypertension Brother   . Heart disease Brother   . Heart failure Father   . Gout Brother   . Hypertension Brother   . Hypertension Brother   . Hypertension Brother   . Post-traumatic stress disorder Brother   . Healthy Daughter      Current Outpatient Medications:  .  aspirin EC 81 MG tablet, Take 81 mg by mouth daily. Swallow whole., Disp: , Rfl:  .  Cholecalciferol (VITAMIN D-3 PO), Take 1,000 Units by mouth daily., Disp: , Rfl:  .  famotidine (PEPCID) 40 MG tablet, , Disp: , Rfl:  .  felodipine (PLENDIL) 10 MG 24 hr tablet, TAKE 1 TABLET BY MOUTH EVERY DAY, Disp: 90 tablet, Rfl: 2 .  fish oil-omega-3 fatty acids 1000 MG capsule, Take 2 capsules by mouth daily., Disp: , Rfl:  .  Fluocinolone Acetonide Scalp 0.01 % OIL, Apply to affected area of scalp as directed 1-2 x/day as needed for itching, Disp: 118.28 mL, Rfl: 1 .  GLUCOSAMINE-CHONDROITIN PO, Take 2 tablets by mouth daily., Disp: , Rfl:  .  L-Methylfolate-Algae-B12-B6 (METANX) 3-90.314-2-35 MG CAPS, Take 1 capsule by mouth 2 (two) times daily. , Disp: , Rfl:  .  Menthol, Topical Analgesic, (BIOFREEZE ROLL-ON EX), Apply 1 application topically as needed (pain in back,neck,thumb)., Disp: , Rfl:  .  mometasone (NASONEX) 50 MCG/ACT nasal spray,  Place 2 sprays into the nose daily. As needed, Disp: , Rfl:  .  Polyvinyl Alcohol-Povidone PF 1.4-0.6 % SOLN, Place 1 drop into both eyes daily as needed (dry eyes). , Disp: , Rfl:  .  pravastatin (PRAVACHOL) 40 MG tablet, TAKE 1 TABLET BY MOUTH ON MONDAY, WEDNESDAY, AND FRIDAY, Disp: 90 tablet, Rfl: 1 .  Cyanocobalamin (VITAMIN B-12 PO), , Disp: , Rfl:  .  nitroGLYCERIN (NITROSTAT) 0.4 MG SL tablet, Place 1 tablet (0.4 mg total) under the tongue every 5 (five) minutes as needed for chest pain. (Patient not taking: Reported on 05/26/2020), Disp: 25 tablet,  Rfl: 2 .  TART CHERRY PO, , Disp: , Rfl:    Allergies  Allergen Reactions  . Fosamax [Alendronate Sodium] Other (See Comments)    REACTION: "CAUSE GI UPSET AND FATIGUE"  . Omeprazole Other (See Comments)    Lower ab pain  . Motrin [Ibuprofen] Other (See Comments)    GI upset- has to eat prior to taking  . Fluvastatin Other (See Comments)    mild memoryproblems of confusion      Review of Systems  Constitutional: Negative.   Eyes: Negative for blurred vision.  Respiratory: Negative.   Cardiovascular: Negative.   Gastrointestinal: Negative.   Psychiatric/Behavioral: Negative.   All other systems reviewed and are negative.    Today's Vitals   05/26/20 1037  BP: 132/76  Pulse: (!) 57  Temp: 98.2 F (36.8 C)  TempSrc: Oral  Weight: 146 lb 6.4 oz (66.4 kg)  Height: 4' 8"  (1.422 m)  PainSc: 1   PainLoc: Generalized   Body mass index is 32.82 kg/m.  Wt Readings from Last 3 Encounters:  05/26/20 146 lb 6.4 oz (66.4 kg)  03/28/20 147 lb 3.2 oz (66.8 kg)  02/23/20 147 lb 3.2 oz (66.8 kg)   Objective:  Physical Exam Vitals and nursing note reviewed.  Constitutional:      Appearance: Normal appearance. She is obese.  HENT:     Head: Normocephalic and atraumatic.  Cardiovascular:     Rate and Rhythm: Normal rate and regular rhythm.     Heart sounds: Normal heart sounds.  Pulmonary:     Breath sounds: Normal breath sounds.  Skin:    General: Skin is warm.  Neurological:     General: No focal deficit present.     Mental Status: She is alert and oriented to person, place, and time.         Assessment And Plan:     1. Type 2 diabetes mellitus with stage 2 chronic kidney disease, without long-term current use of insulin (HCC) Comments: Chronic. I will check labs as listed below. She is encouraged to aim for at least 150 minutes of exercise per week.  - BMP8+EGFR - Hemoglobin A1c  2. Hypertensive nephropathy Comments: Chronic. She will continue with current meds  and advised to follow low sodium diet.   3. Osteopenia, unspecified location Comments: I went over her most recent dexa results in full detail. Encouraged to comply with calcium/vit D supplementation along with weight-bearing exercises.   4. Class 1 obesity due to excess calories with serious comorbidity and body mass index (BMI) of 32.0 to 32.9 in adult  She is encouraged to strive for BMI less than 30 to decrease cardiac risk. Advised to aim for at least 150 minutes of exercise per week.  Patient was given opportunity to ask questions. Patient verbalized understanding of the plan and was able to repeat key elements of the  plan. All questions were answered to their satisfaction.  [name], MD   I, [name], MD, have reviewed all documentation for this visit. The documentation on 05/26/20 for the exam, diagnosis, procedures, and orders are all accurate and complete.  THE PATIENT IS ENCOURAGED TO PRACTICE SOCIAL DISTANCING DUE TO THE COVID-19 PANDEMIC.

## 2020-05-27 LAB — BMP8+EGFR
BUN/Creatinine Ratio: 14 (ref 12–28)
BUN: 11 mg/dL (ref 8–27)
CO2: 23 mmol/L (ref 20–29)
Calcium: 9.2 mg/dL (ref 8.7–10.3)
Chloride: 105 mmol/L (ref 96–106)
Creatinine, Ser: 0.79 mg/dL (ref 0.57–1.00)
GFR calc Af Amer: 85 mL/min/{1.73_m2} (ref >59)
GFR calc non Af Amer: 73 mL/min/{1.73_m2} (ref >59)
Glucose: 100 mg/dL — ABNORMAL HIGH (ref 65–99)
Potassium: 4.1 mmol/L (ref 3.5–5.2)
Sodium: 143 mmol/L (ref 134–144)

## 2020-05-27 LAB — HEMOGLOBIN A1C
Est. average glucose Bld gHb Est-mCnc: 117 mg/dL
Hgb A1c MFr Bld: 5.7 % — ABNORMAL HIGH (ref 4.8–5.6)

## 2020-06-16 DIAGNOSIS — G4733 Obstructive sleep apnea (adult) (pediatric): Secondary | ICD-10-CM | POA: Diagnosis not present

## 2020-06-23 ENCOUNTER — Other Ambulatory Visit: Payer: Self-pay | Admitting: Internal Medicine

## 2020-07-18 ENCOUNTER — Telehealth: Payer: Self-pay | Admitting: Nurse Practitioner

## 2020-07-18 NOTE — Telephone Encounter (Addendum)
Left message with changed appointment provider per 2/14 schedule message. Gave option to call back to reschedule if needed.

## 2020-07-21 ENCOUNTER — Telehealth: Payer: Self-pay | Admitting: Hematology

## 2020-07-21 NOTE — Telephone Encounter (Signed)
Left message with upcoming appointment. Gave option to call back to reschedule if needed. 

## 2020-07-27 ENCOUNTER — Other Ambulatory Visit: Payer: Self-pay

## 2020-07-27 ENCOUNTER — Ambulatory Visit: Payer: Medicare PPO | Admitting: Physician Assistant

## 2020-07-27 ENCOUNTER — Encounter: Payer: Self-pay | Admitting: Physician Assistant

## 2020-07-27 DIAGNOSIS — F458 Other somatoform disorders: Secondary | ICD-10-CM

## 2020-07-27 NOTE — Patient Instructions (Signed)
Get over the counter lotions with Pramoxine in it. If over the counter doesn't work patient can speak with Dr. Baird Cancer and get a prescription for Gabapentin.

## 2020-07-29 ENCOUNTER — Ambulatory Visit: Payer: Medicare PPO | Admitting: Physician Assistant

## 2020-08-02 NOTE — Progress Notes (Deleted)
Office Visit Note  Patient: Mariah Burns             Date of Birth: 02-Jul-1945           MRN: [id number]             PCP: Glendale Chard, MD Referring: Glendale Chard, MD Visit Date: 08/16/2020 Occupation: @GUAROCC @  Subjective:  No chief complaint on file.   History of Present Illness: Mariah Burns is a 75 y.o. female ***   Activities of Daily Living:  Patient reports morning stiffness for *** {minute/hour:19697}.   Patient {ACTIONS;DENIES/REPORTS:21021675::"Denies"} nocturnal pain.  Difficulty dressing/grooming: {ACTIONS;DENIES/REPORTS:21021675::"Denies"} Difficulty climbing stairs: {ACTIONS;DENIES/REPORTS:21021675::"Denies"} Difficulty getting out of chair: {ACTIONS;DENIES/REPORTS:21021675::"Denies"} Difficulty using hands for taps, buttons, cutlery, and/or writing: {ACTIONS;DENIES/REPORTS:21021675::"Denies"}  No Rheumatology ROS completed.   PMFS History:  Patient Active Problem List   Diagnosis Date Noted  . 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 Class 1 obesity due to excess calories with serious comorbidity and body mass index (BMI) of 30.0 to 30.9 in adult 10/28/2019  . Vitamin D deficiency disease 10/28/2019  . Chronic bilateral low back pain without sciatica 10/28/2019  . Hip pain, bilateral 10/28/2019  . Malignant neoplasm of upper-outer quadrant of right breast in female, estrogen receptor positive ([place]) 08/11/2018  . Chronic renal disease, stage II 05/15/2018  . 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 Adult BMI 29.0-29.9 kg/sq m 05/15/2018  . Dyspnea on exertion 02/17/2015  . Incomplete right bundle branch block 02/17/2015  . Systolic murmur 12/75/1700  . Pericardial effusion 02/17/2015  . Osteopenia 10/05/2011  . Type 2 diabetes mellitus with stage 2 chronic kidney disease, without long-term current use of insulin ([place]) 05/06/2010  . SARCOIDOSIS 03/21/2007  . HYPERLIPIDEMIA  03/21/2007  . Obstructive sleep apnea 03/21/2007  . Essential hypertension 03/21/2007    Past Medical History:  Diagnosis Date  . Anxiety   . Cancer ([place]) 08/2018   right breast IDC  . Hyperlipidemia   . Hypertension   . OSA (obstructive sleep apnea)    NPSG 03/08/2010  AHI 19.8/hr, CPAP nightly  . Osteopenia   . Pre-diabetes   . 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 Allergies Sister   . Heart disease Sister   . Lupus Sister   . Allergies Mother   . Allergies Daughter   . Healthy Daughter   . Heart disease Sister   . Gout Brother   . Hypertension Brother   . Heart disease Brother   . Heart failure Father   . Gout Brother   . Hypertension Brother   . Hypertension Brother   . Hypertension Brother   . Post-traumatic stress disorder Brother   . Healthy Daughter    Past Surgical History:  Procedure Laterality Date  . BREAST LUMPECTOMY WITH RADIOACTIVE SEED AND SENTINEL LYMPH NODE BIOPSY Right 09/02/2018   Procedure: RIGHT BREAST LUMPECTOMY WITH RADIOACTIVE SEED AND  RIGHT SENTINEL LYMPH NODE BIOPSY;  Surgeon: Excell Seltzer, MD;  Location: [facility];  Service: General;  Laterality: Right;  . BRONCHOSCOPY     dx'd sarcoid  . KNEE ARTHROPLASTY Left 03/13/2017  . NEUROPLASTY / TRANSPOSITION ULNAR NERVE AT ELBOW  09/2011  . TOTAL ABDOMINAL HYSTERECTOMY     Social History   Social History Narrative  Patient lives at home with daughter.   Caffeine Use: none   Immunization History  Administered Date(s) Administered  . Fluad Quad(high Dose 65+) 02/12/2019, 02/23/2020  . Influenza Split 04/10/2011, 04/15/2012, 03/04/2013, 03/04/2014, 03/05/2015, 03/04/2017  . Influenza Whole 06/13/2010  . Influenza,inj,Quad PF,6+ Mos 03/22/2016  . Influenza-Unspecified 03/22/2016, 03/04/2018  . PFIZER(Purple  Top)SARS-COV-2 Vaccination 08/09/2019, 09/08/2019, 05/18/2020, 05/18/2020  . Pneumococcal Conjugate-13 11/13/2017  . Pneumococcal Polysaccharide-23 06/04/2009, 12/28/2010, 06/11/2019  . Td 05/31/2008  . Tdap 12/13/2018  . Unspecified SARS-COV-2 Vaccination 09/08/2019     Objective: Vital Signs: There were no vitals taken for this visit.   Physical Exam   Musculoskeletal Exam: ***  CDAI Exam: CDAI Score: -- Patient Global: --; Provider Global: -- Swollen: --; Tender: -- Joint Exam 08/16/2020   No joint exam has been documented for this visit   There is currently no information documented on the homunculus. Go to the Rheumatology activity and complete the homunculus joint exam.  Investigation: No additional findings.  Imaging: No results found.  Recent Labs: Lab Results  Component Value Date   WBC 3.8 02/23/2020   HGB 12.0 02/23/2020   PLT 178 02/23/2020   NA 143 05/26/2020   K 4.1 05/26/2020   CL 105 05/26/2020   CO2 23 05/26/2020   GLUCOSE 100 (H) 05/26/2020   BUN 11 05/26/2020   CREATININE 0.79 05/26/2020   BILITOT 0.3 02/23/2020   ALKPHOS 78 02/23/2020   AST 20 02/23/2020   ALT 15 02/23/2020   PROT 7.3 02/23/2020   ALBUMIN 4.4 02/23/2020   CALCIUM 9.2 05/26/2020   GFRAA 85 05/26/2020    Speciality Comments: No specialty comments available.  Procedures:  No procedures performed Allergies: Fosamax [alendronate sodium], Omeprazole, Motrin [ibuprofen], and Fluvastatin   Assessment / Plan:     Visit Diagnoses: No diagnosis found.  Orders: No orders of the defined types were placed in this encounter.  No orders of the defined types were placed in this encounter.   Face-to-face time spent with patient was *** minutes. Greater than 50% of time was spent in counseling and coordination of care.  Follow-Up Instructions: No follow-ups on file.   Earnestine Mealing, CMA  Note - This record has been created using Editor, commissioning.  Chart creation errors  have been sought, but may not always  have been located. Such creation errors do not reflect on  the standard of medical care.

## 2020-08-08 NOTE — Progress Notes (Signed)
Mariah Burns   [phone] [phone]   Clinic Follow up Note   Patient Care Team: Glendale Chard, MD as PCP - General (Internal Medicine) Belva Crome, MD as PCP - Cardiology (Cardiology) Mauro Kaufmann, RN as Oncology Nurse Navigator Rockwell Germany, RN as Oncology Nurse Navigator Excell Seltzer, MD (Inactive) as Consulting Physician (General Surgery) Truitt Merle, MD as Consulting Physician (Hematology) Gery Pray, MD as Consulting Physician (Radiation Oncology) Alla Feeling, NP as Nurse Practitioner (Nurse Practitioner) Starlyn Skeans as Physician Assistant (Dermatology)  Date of Service:  08/11/2020  CHIEF COMPLAINT: F/u of right breast cancer  SUMMARY OF ONCOLOGIC HISTORY: Oncology History Overview Note  Cancer Staging Malignant neoplasm of upper-outer quadrant of right breast in female, estrogen receptor positive ([place]) Staging form: Breast, AJCC 8th Edition - Clinical stage from 08/04/2018: Stage IA (cT1c, cN0, cM0, G2, ER+, PR+, HER2-) - Signed by Truitt Merle, MD on 08/12/2018 - Pathologic stage from 09/02/2018: Stage IA (pT2, pN1a, cM0, G1, ER+, PR+, HER2-) - Signed by Truitt Merle, MD on 09/29/2018     Malignant neoplasm of upper-outer quadrant of right breast in female, estrogen receptor positive ([place])  07/29/2018 Mammogram   Diagnostic Mammgram 07/29/18  IMPRESSION:  The 1.7cm oval mass in the right breast upper outer qudrant middle third, 3-5 from nipple is suspicious of malignancy.    08/04/2018 Cancer Staging   Staging form: Breast, AJCC 8th Edition - Clinical stage from 08/04/2018: Stage IA (cT1c, cN0, cM0, G2, ER+, PR+, HER2-) - Signed by Truitt Merle, MD on 08/12/2018   08/04/2018 Initial Biopsy   Diagnosis 08/04/18  Breast, right, needle core biopsy, 1.7 cm irregular solid mass at 9 o'clock, 4 cm fn - INVASIVE DUCTAL CARCINOMA WITH EXTRACELLULAR MUCIN.   08/04/2018 Receptors her2   Results: IMMUNOHISTOCHEMICAL AND  MORPHOMETRIC ANALYSIS PERFORMED MANUALLY The tumor cells are NEGATIVE for Her2 (0). Estrogen Receptor: 100%, POSITIVE, STRONG STAINING INTENSITY Progesterone Receptor: 100%, POSITIVE, STRONG STAINING INTENSITY Proliferation Marker Ki67: 10%   08/11/2018 Initial Diagnosis   Malignant neoplasm of upper-outer quadrant of right breast in female, estrogen receptor positive ([place])   09/02/2018 Surgery   RIGHT BREAST LUMPECTOMY WITH RADIOACTIVE SEED AND  RIGHT SENTINEL LYMPH NODE BIOPSY by Dr. Excell Seltzer  09/02/18    09/02/2018 Pathology Results   Diagnosis 1. Breast, lumpectomy, Right w/seed - INVASIVE DUCTAL CARCINOMA WITH EXTRACELLULAR MUCIN, GRADE I, 2.1 CM. - DUCTAL CARCINOMA IN SITU, INTERMEDIATE NUCLEAR GRADE. - INVASIVE CARCINOMA IS 2 MM FROM SUPERIOR MARGIN AND 2.5 MM FROM LATERAL MARGIN. - DUCTAL CARCINOMA IN SITU IS LESS THAN 1 MM FROM ANTERIOR MARGIN, 4 MM FROM SUPERIOR AND LATERAL MARGINS, 5 MM FROM INFERIOR MARGIN AND 8 MM FROM THE POSTERIOR MARGIN. - SMALL INTRADUCTAL PAPILLOMA. - NO EVIDENCE OF LYMPHOVASCULAR OR PERINEURAL INVASION. - BIOPSY SITE CHANGES. - SEE ONCOLOGY TABLE. 2. Lymph node, sentinel, biopsy, Right Axillary - LYMPH NODE, NEGATIVE FOR CARCINOMA (0/1). 3. Lymph node, sentinel, biopsy, Right - LYMPH NODE, NEGATIVE FOR CARCINOMA (0/1). 4. Lymph node, sentinel, biopsy, Right - METASTATIC BREAST CARCINOMA TO ONE LYMPH NODE (1/1). - METASTATIC FOCUS MEASURES 0.4 CM IN GREATEST DIMENSION. - NO DEFINITE EVIDENCE OF EXTRANODAL EXTENSION.   09/02/2018 Miscellaneous   MammaPrint  09/02/18 Low risk with 10% average 10-year risk of recurrnce untreated  Mammaprint Index: +0.287 97.8% of patients treated with hormonal therapy have no distnat recurrance.    09/02/2018 Cancer Staging   Staging form: Breast, AJCC 8th Edition - Pathologic stage from  09/02/2018: Stage IA (pT2, pN1a, cM0, G1, ER+, PR+, HER2-) - Signed by Truitt Merle, MD on 09/29/2018   10/05/2018 - 01/2019  Anti-estrogen oral therapy   Anastrozole 24m daily starting 10/05/18; stopped 5/18 due to insomnia, hot flashes, depression, and vaginal irritation.  I switched her Exemestane in 01/2019. She started having dizziness, hot flashes and joint pain so she stopped two days later.  -She tried for no more than 3 weeks total   11/12/2018 - 12/30/2018 Radiation Therapy   RT 11/12/18-12/30/18 with Dr. KEarney Hamburg   05/14/2019 Survivorship   SCP per LCira Rue NP    06/2019 - 06/2019 Anti-estrogen oral therapy   Tamoxifen 232mdaily starting 06/2019 but stopped after a few days due to dizziness, nausea and fatigue.       CURRENT THERAPY:  Surveillance  INTERVAL HISTORY:  FaHELLENA PRIDGENs here for a follow up of right breast cancer. She was last seen by me 6 months ago. She presents to the clinic alone. Doing well overall Still has some right breast shooting pain, tolerable Occasional back and hip pain, more related to activities, likely from osteoarthritis, slightly worse in the past year,  But she does not need a crane. She goes to Gym five time a week including pool exercise, stays active.  Appetite is normal, weight is stable   All other systems were reviewed with the patient and are negative.  MEDICAL HISTORY:  Past Medical History:  Diagnosis Date  . Anxiety   . [place]   right breast IDC  . Hyperlipidemia   . Hypertension   . OSA (obstructive sleep apnea)    NPSG 03/08/2010  AHI 19.8/hr, CPAP nightly  . Osteopenia   . Pre-diabetes   . 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 BREAST LUMPECTOMY WITH RADIOACTIVE SEED AND SENTINEL LYMPH NODE BIOPSY Right 09/02/2018   Procedure: RIGHT BREAST LUMPECTOMY WITH RADIOACTIVE SEED AND  RIGHT SENTINEL LYMPH NODE BIOPSY;  Surgeon: HoExcell SeltzerMD;   [facility] Service: General;  Laterality: Right;  . BRONCHOSCOPY     dx'd sarcoid  . KNEE ARTHROPLASTY Left 03/13/2017  . NEUROPLASTY / TRANSPOSITION ULNAR NERVE AT ELBOW  09/2011  . TOTAL ABDOMINAL HYSTERECTOMY      I have reviewed the social history and family history with the patient and they are unchanged from previous note.  ALLERGIES:  is allergic to fosamax [alendronate sodium], omeprazole, motrin [ibuprofen], and fluvastatin.  MEDICATIONS:  Current Outpatient Medications  Medication Sig Dispense Refill  . aspirin EC 81 MG tablet Take 81 mg by mouth daily. Swallow whole.    . Cholecalciferol (VITAMIN D-3 PO) Take 1,000 Units by mouth daily.    . Cyanocobalamin (VITAMIN B-12 PO)     . famotidine (PEPCID) 40 MG tablet  (Patient not taking: Reported on 07/27/2020)    . felodipine (PLENDIL) 10 MG 24 hr tablet TAKE 1 TABLET BY MOUTH EVERY DAY 90 tablet 2  . fish oil-omega-3 fatty acids 1000 MG capsule Take 2 capsules by mouth daily.    . Fluocinolone Acetonide Scalp 0.01 % OIL Apply to affected area of scalp as directed 1-2 x/day as needed for itching 118.28 mL 1  . GLUCOSAMINE-CHONDROITIN PO Take 2 tablets by mouth daily.    . Marland Kitchen-Methylfolate-Algae-B12-B6 (MS[facility] [id number]  MG CAPS Take 1 capsule by mouth 2 (two) times daily.     . Menthol, Topical Analgesic, (BIOFREEZE ROLL-ON EX) Apply 1 application topically as needed (pain in back,neck,thumb).    . mometasone (NASONEX) 50 MCG/ACT nasal spray Place 2 sprays into the nose daily. As needed    . nitroGLYCERIN (NITROSTAT) 0.4 MG SL tablet Place 1 tablet (0.4 mg total) under the tongue every 5 (five) minutes as needed for chest pain. 25 tablet 2  . Polyvinyl Alcohol-Povidone PF 1.4-0.6 % SOLN Place 1 drop into both eyes daily as needed (dry eyes).     . pravastatin (PRAVACHOL) 40 MG tablet TAKE 1 TABLET BY MOUTH ON MONDAY, WEDNESDAY, AND FRIDAY 90 tablet 1  . TART CHERRY PO      No current  facility-administered medications for this visit.    PHYSICAL EXAMINATION: ECOG PERFORMANCE STATUS: 1 - Symptomatic but completely ambulatory  Vitals:   08/11/20 1429  BP: (!) 140/56  Pulse: 70  Resp: 19  Temp: (!) 97 F (36.1 C)  SpO2: 100%   Filed Weights   08/11/20 1429  Weight: 147 lb 12.8 oz (67 kg)    GENERAL:alert, no distress and comfortable SKIN: skin color, texture, turgor are normal, no rashes or significant lesions EYES: normal, Conjunctiva are pink and non-injected, sclera clear NECK: supple, thyroid normal size, non-tender, without nodularity LYMPH:  no palpable lymphadenopathy in the cervical, axillary  LUNGS: clear to auscultation and percussion with normal breathing effort HEART: regular rate & rhythm and no murmurs and no lower extremity edema ABDOMEN:abdomen soft, non-tender and normal bowel sounds Musculoskeletal:no cyanosis of digits and no clubbing  NEURO: alert & oriented x 3 with fluent speech, no focal motor/sensory deficits Breasts: Breast inspection showed them to be symmetrical with no nipple discharge.  Surgical scar in the right breast is well-healed, and there is a 2 cm mass in lateral of right breast, above her incision, palpation of the left breast and axilla revealed no obvious mass that I could appreciate.   LABORATORY DATA:  I have reviewed the data as listed CBC Latest Ref Rng & Units 08/11/2020 02/23/2020 08/12/2019  WBC 4.0 - 10.5 K/uL 4.0 3.8 3.6(L)  Hemoglobin 12.0 - 15.0 g/dL 12.0 12.0 11.8(L)  Hematocrit 36.0 - 46.0 % 36.9 37.0 35.4(L)  Platelets 150 - 400 K/uL 199 178 168     CMP Latest Ref Rng & Units 08/11/2020 05/26/2020 02/23/2020  Glucose 70 - 99 mg/dL 108(H) 100(H) 91  BUN 8 - 23 mg/dL 16 11 12   Creatinine 0.44 - 1.00 mg/dL 1.07(H) 0.79 0.81  Sodium 135 - 145 mmol/L 141 143 143  Potassium 3.5 - 5.1 mmol/L 3.5 4.1 3.4(L)  Chloride 98 - 111 mmol/L 106 105 102  CO2 22 - 32 mmol/L 27 23 27   Calcium 8.9 - 10.3 mg/dL 9.5 9.2  9.7  Total Protein 6.5 - 8.1 g/dL 8.1 - 7.3  Total Bilirubin 0.3 - 1.2 mg/dL 0.4 - 0.3  Alkaline Phos 38 - 126 U/L 73 - 78  AST 15 - 41 U/L 19 - 20  ALT 0 - 44 U/L 16 - 15      RADIOGRAPHIC STUDIES: I have personally reviewed the radiological images as listed and agreed with the findings in the report. No results found.   ASSESSMENT & PLAN:  ADISEN BENNION is a 75 y.o. female with    1. Malignant neoplasm of upper-outer quadrant of right breast, Stage IA,pT2N1aM0, ER/PR+, HER2-, Grade I, Mammaprint low risk -  She was diagnosed in 08/2018. She is s/p right lumpectomy with SLNB on 3/31/20and is s/p adjuvant Radiation therapy. -Her mammaprint showed low risk of distantrecurrence.Adjuvant chemo was not recommended. -Given her ER/PR+ disease,she has tried antiestrogen therapy with Anastrozole, Exemestane and most recently Tamoxifen over 3-4 months total. Given poor toleration she is fine to continue surveillance alone given her low risk of recurrence.  -She is clinically doing well, lab reviewed, exam was unremarkable except the stable right breast lump since surgery.  No clinical suspicion for recurrence -She is due for mammogram, [facility] has been [facility] the end of this month -Continue breast cancer surveillance.  Lab and follow-up in 6 months  2.Osteopenia -12/2018 DEXA shows osteopenia with lowest T-score -1.3 at left femur neck.She has a estimated fracture risk is 4.5% in the next 10 and 0.7% risk of hip fracture in the next 10 years. Repeat DEXA in 2022. -Continue 2000 unit Vitamin D3 and oral calcium and weight bearing exercise.  -She is no longer on AI due to poor tolerance.  3. Cancer Screening, Genetic Testing has not been perused yet, pt will think about it.  -Her last colonoscopy was in 2018 and benign adenomas were removed. Will repeat in 2023. -I encouraged her to stay up to date on her cancer screening and vaccines. She has received her COVID19  vaccine series.   4. HTN and DM -overall controlled. Continue medications and continue to f/u with PCP -She exercised 5 days a week with adequate energy. Her weight remains stable. I discussed healthy balanced diet to help.    PLAN: -Mammogram is scheduled at [facility] at the end of this month -Lab and F/u with NP Lacie in 6 months    No problem-specific Assessment & Plan notes found for this encounter.   No orders of the defined types were placed in this encounter.  All questions were answered. The patient knows to call the clinic with any problems, questions or concerns. No barriers to learning was detected. The total time spent in the appointment was 25 minutes.     Truitt Merle, MD 08/11/2020   I, Joslyn Devon, am acting as scribe for Truitt Merle, MD.   I have reviewed the above documentation for accuracy and completeness, and I agree with the above.

## 2020-08-11 ENCOUNTER — Other Ambulatory Visit: Payer: Self-pay

## 2020-08-11 ENCOUNTER — Inpatient Hospital Stay: Payer: Medicare PPO | Attending: Hematology

## 2020-08-11 ENCOUNTER — Encounter: Payer: Self-pay | Admitting: Hematology

## 2020-08-11 ENCOUNTER — Ambulatory Visit: Payer: Medicare PPO | Admitting: Nurse Practitioner

## 2020-08-11 ENCOUNTER — Inpatient Hospital Stay: Payer: Medicare PPO | Admitting: Hematology

## 2020-08-11 VITALS — BP 140/56 | HR 70 | Temp 97.0°F | Resp 19 | Ht <= 58 in | Wt 147.8 lb

## 2020-08-11 DIAGNOSIS — Z170 Estrogen receptor positive status [ER+]: Secondary | ICD-10-CM | POA: Diagnosis not present

## 2020-08-11 DIAGNOSIS — M85852 Other specified disorders of bone density and structure, left thigh: Secondary | ICD-10-CM | POA: Diagnosis not present

## 2020-08-11 DIAGNOSIS — Z7982 Long term (current) use of aspirin: Secondary | ICD-10-CM | POA: Insufficient documentation

## 2020-08-11 DIAGNOSIS — Z7951 Long term (current) use of inhaled steroids: Secondary | ICD-10-CM | POA: Diagnosis not present

## 2020-08-11 DIAGNOSIS — Z923 Personal history of irradiation: Secondary | ICD-10-CM | POA: Diagnosis not present

## 2020-08-11 DIAGNOSIS — E119 Type 2 diabetes mellitus without complications: Secondary | ICD-10-CM | POA: Insufficient documentation

## 2020-08-11 DIAGNOSIS — I10 Essential (primary) hypertension: Secondary | ICD-10-CM | POA: Diagnosis not present

## 2020-08-11 DIAGNOSIS — C50411 Malignant neoplasm of upper-outer quadrant of right female breast: Secondary | ICD-10-CM

## 2020-08-11 LAB — CBC WITH DIFFERENTIAL (CANCER CENTER ONLY)
Abs Immature Granulocytes: 0.01 10*3/uL (ref 0.00–0.07)
Basophils Absolute: 0 10*3/uL (ref 0.0–0.1)
Basophils Relative: 1 %
Eosinophils Absolute: 0.2 10*3/uL (ref 0.0–0.5)
Eosinophils Relative: 4 %
HCT: 36.9 % (ref 36.0–46.0)
Hemoglobin: 12 g/dL (ref 12.0–15.0)
Immature Granulocytes: 0 %
Lymphocytes Relative: 26 %
Lymphs Abs: 1.1 10*3/uL (ref 0.7–4.0)
MCH: 28.4 pg (ref 26.0–34.0)
MCHC: 32.5 g/dL (ref 30.0–36.0)
MCV: 87.4 fL (ref 80.0–100.0)
Monocytes Absolute: 0.5 10*3/uL (ref 0.1–1.0)
Monocytes Relative: 11 %
Neutro Abs: 2.3 10*3/uL (ref 1.7–7.7)
Neutrophils Relative %: 58 %
Platelet Count: 199 10*3/uL (ref 150–400)
RBC: 4.22 MIL/uL (ref 3.87–5.11)
RDW: 13.8 % (ref 11.5–15.5)
WBC Count: 4 10*3/uL (ref 4.0–10.5)
nRBC: 0 % (ref 0.0–0.2)

## 2020-08-11 LAB — CMP (CANCER CENTER ONLY)
ALT: 16 U/L (ref 0–44)
AST: 19 U/L (ref 15–41)
Albumin: 4 g/dL (ref 3.5–5.0)
Alkaline Phosphatase: 73 U/L (ref 38–126)
Anion gap: 8 (ref 5–15)
BUN: 16 mg/dL (ref 8–23)
CO2: 27 mmol/L (ref 22–32)
Calcium: 9.5 mg/dL (ref 8.9–10.3)
Chloride: 106 mmol/L (ref 98–111)
Creatinine: 1.07 mg/dL — ABNORMAL HIGH (ref 0.44–1.00)
GFR, Estimated: 54 mL/min — ABNORMAL LOW (ref >60)
Glucose, Bld: 108 mg/dL — ABNORMAL HIGH (ref 70–99)
Potassium: 3.5 mmol/L (ref 3.5–5.1)
Sodium: 141 mmol/L (ref 135–145)
Total Bilirubin: 0.4 mg/dL (ref 0.3–1.2)
Total Protein: 8.1 g/dL (ref 6.5–8.1)

## 2020-08-12 NOTE — Progress Notes (Signed)
Office Visit Note  Patient: Mariah Burns             Date of Birth: [birth year]           MRN: [id number]             PCP: Glendale Chard, MD Referring: Glendale Chard, MD Visit Date: 08/17/2020 Occupation: @GUAROCC @  Subjective:  Left hip pain.   History of Present Illness: Mariah Burns is a 75 y.o. female history of sarcoidosis and osteoarthritis.  She states that she was seen by Dr. Annamaria Boots and her lungs are stable.  She has been having pain and discomfort in her joints which moves around.  Recently she has been experiencing discomfort in her left hip.  She continues to have some discomfort in her hands and her knees.  She has not seen any joint swelling.  Activities of Daily Living:  Patient reports morning stiffness for a few minutes.   Patient Denies nocturnal pain.  Difficulty dressing/grooming: Reports Difficulty climbing stairs: Reports Difficulty getting out of chair: Reports Difficulty using hands for taps, buttons, cutlery, and/or writing: Reports  Review of Systems  Constitutional: Negative for fatigue.  HENT: Negative for mouth sores, mouth dryness and nose dryness.   Eyes: Positive for dryness. Negative for pain and itching.  Respiratory: Positive for shortness of breath. Negative for difficulty breathing.   Cardiovascular: Negative for chest pain and palpitations.  Gastrointestinal: Negative for blood in stool, constipation and diarrhea.  Endocrine: Negative for increased urination.  Genitourinary: Negative for difficulty urinating.  Musculoskeletal: Positive for arthralgias, joint pain, myalgias, morning stiffness, muscle tenderness and myalgias. Negative for joint swelling.  Skin: Negative for color change, rash and redness.  Allergic/Immunologic: Negative for susceptible to infections.  Neurological: Positive for dizziness, numbness and memory loss. Negative for headaches and weakness.  Hematological: Negative for bruising/bleeding tendency.   Psychiatric/Behavioral: Positive for confusion.    PMFS History:  Patient Active Problem List   Diagnosis Date Noted  . Secondary and unspecified malignant neoplasm of axilla and upper limb lymph nodes ([place]) 10/28/2019  . Class 1 obesity due to excess calories with serious comorbidity and body mass index (BMI) of 30.0 to 30.9 in adult 10/28/2019  . Vitamin D deficiency disease 10/28/2019  . Chronic bilateral low back pain without sciatica 10/28/2019  . Hip pain, bilateral 10/28/2019  . Malignant neoplasm of upper-outer quadrant of right breast in female, estrogen receptor positive ([place]) 08/11/2018  . Chronic renal disease, stage II 05/15/2018  . 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 Adult BMI 29.0-29.9 kg/sq m 05/15/2018  . Dyspnea on exertion 02/17/2015  . Incomplete right bundle branch block 02/17/2015  . Systolic murmur 92/33/0076  . Pericardial effusion 02/17/2015  . Osteopenia 10/05/2011  . Type 2 diabetes mellitus with stage 2 chronic kidney disease, without long-term current use of insulin ([place]) 05/06/2010  . SARCOIDOSIS 03/21/2007  . HYPERLIPIDEMIA 03/21/2007  . Obstructive sleep apnea 03/21/2007  . Essential hypertension 03/21/2007    Past Medical History:  Diagnosis Date  . Anxiety   . Cancer ([place]) 08/2018   right breast IDC  . Hyperlipidemia   . Hypertension   . OSA (obstructive sleep apnea)    NPSG 03/08/2010  AHI 19.8/hr, CPAP nightly  . Osteopenia   . Pre-diabetes   . 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 Allergies Sister   . Heart disease Sister   . Lupus Sister   . Allergies Mother   . Allergies Daughter   . Healthy Daughter   . Heart disease Sister   . Gout Brother   . Hypertension Brother   . Heart disease Brother   . Heart failure Father   . Gout Brother   . Hypertension Brother   .  Hypertension Brother   . Hypertension Brother   . Post-traumatic stress disorder Brother   . Healthy Daughter    Past Surgical History:  Procedure Laterality Date  . BREAST LUMPECTOMY WITH RADIOACTIVE SEED AND SENTINEL LYMPH NODE BIOPSY Right 09/02/2018   Procedure: RIGHT BREAST LUMPECTOMY WITH RADIOACTIVE SEED AND  RIGHT SENTINEL LYMPH NODE BIOPSY;  Surgeon: Excell Seltzer, MD;  Location: [facility];  Service: General;  Laterality: Right;  . BRONCHOSCOPY     dx'd sarcoid  . KNEE ARTHROPLASTY Left 03/13/2017  . NEUROPLASTY / TRANSPOSITION ULNAR NERVE AT ELBOW  09/2011  . TOTAL ABDOMINAL HYSTERECTOMY     Social History   Social History Narrative   Patient lives at home with daughter.   Caffeine Use: none   Immunization History  Administered Date(s) Administered  . Fluad Quad(high Dose 65+) 02/12/2019, 02/23/2020  . Influenza Split 04/10/2011, 04/15/2012, 03/04/2013, 03/04/2014, 03/05/2015, 03/04/2017  . Influenza Whole 06/13/2010  . Influenza,inj,Quad PF,6+ Mos 03/22/2016  . Influenza-Unspecified 03/22/2016, 03/04/2018  . PFIZER(Purple Top)SARS-COV-2 Vaccination 08/09/2019, 09/08/2019, 05/18/2020  . Pneumococcal Conjugate-13 11/13/2017  . Pneumococcal Polysaccharide-23 06/04/2009, 12/28/2010, 06/11/2019  . Td 05/31/2008  . Tdap 12/13/2018  . Unspecified SARS-COV-2 Vaccination 09/08/2019     Objective: Vital Signs: BP (!) 149/76 (BP Location: Left Arm, Patient Position: Sitting, Cuff Size: Normal)   Pulse 60   Resp 14   Ht 4\' 11"  (1.499 m)   Wt 148 lb 12.8 oz (67.5 kg)   BMI 30.05 kg/m    Physical Exam Vitals and nursing note reviewed.  Constitutional:      Appearance: She is well-developed.  HENT:     Head: Normocephalic and atraumatic.  Eyes:     Conjunctiva/sclera: Conjunctivae normal.  Cardiovascular:     Rate and Rhythm: Normal rate and regular rhythm.     Heart sounds: Normal heart sounds.  Pulmonary:     Effort: Pulmonary effort is  normal.     Breath sounds: Normal breath sounds.  Abdominal:     General: Bowel sounds are normal.     Palpations: Abdomen is soft.  Musculoskeletal:     Cervical back: Normal range of motion.  Lymphadenopathy:     Cervical: No cervical adenopathy.  Skin:    General: Skin is warm and dry.     Capillary Refill: Capillary refill takes less than 2 seconds.  Neurological:     Mental Status: She is alert and oriented to person, place, and time.  Psychiatric:        Behavior: Behavior normal.      Musculoskeletal Exam: C-spine was in good range of motion.  She has some discomfort range of motion of her shoulder joints.  She had mild prominence of PIP and DIP joints but no synovitis.  She has discomfort range of motion of her left hip joint.  She had tenderness over left trochanteric bursa.  Knee joints with good range of motion without any warmth swelling or effusion.  There was no tenderness over ankles or MTP joints.  CDAI Exam: CDAI Score: - Patient Global: -;  Provider Global: - Swollen: -; Tender: - Joint Exam 08/17/2020   No joint exam has been documented for this visit   There is currently no information documented on the homunculus. Go to the Rheumatology activity and complete the homunculus joint exam.  Investigation: No additional findings.  Imaging: No results found.  Recent Labs: Lab Results  Component Value Date   WBC 4.0 08/11/2020   HGB 12.0 08/11/2020   PLT 199 08/11/2020   NA 141 08/11/2020   K 3.5 08/11/2020   CL 106 08/11/2020   CO2 27 08/11/2020   GLUCOSE 108 (H) 08/11/2020   BUN 16 08/11/2020   CREATININE 1.07 (H) 08/11/2020   BILITOT 0.4 08/11/2020   ALKPHOS 73 08/11/2020   AST 19 08/11/2020   ALT 16 08/11/2020   PROT 8.1 08/11/2020   ALBUMIN 4.0 08/11/2020   CALCIUM 9.5 08/11/2020   GFRAA 85 05/26/2020    Speciality Comments: No specialty comments available.  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  Chest x-ray from November 28, 2019 showed old scarring.  She was also evaluated by Dr. Annamaria Boots on March 28, 2020.  I reviewed the notes.  The pulmonary sarcoidosis is not active.   Primary osteoarthritis of both hands-she complains of discomfort in her hands.  She has mild osteoarthritic changes.  Pain in left hip -she has been experiencing some discomfort over left trochanteric bursa.  She also had painful range of motion of her left hip joint.  Plan: XR HIP UNILAT W OR W/O PELVIS 2-3 VIEWS LEFT.  X-ray was unremarkable.  The findings were explained to the patient.  A handout on IT band exercises was given.  I advised her if her symptoms do not improve she can call us and we can refer her to physical therapy.  I will avoid cortisone injection as she is diabetic and also has hypertension.  Primary osteoarthritis of both knees-she has off-and-on discomfort in her knee joints.  DDD (degenerative disc disease), cervical -She has mild discomfort and limited range of motion.  Carpal tunnel syndrome of right wrist-she has off-and-on numbness in her right hand.  Osteopenia of multiple sites - 12/2018 The BMD measured at Femur Neck Left is 0.856 g/cm2 with a T-score of-1.3.  Calcium rich diet and vitamin D was discussed.  Resistive exercises were discussed.  History of vitamin D deficiency  History of hypertension-blood pressure is elevated today.  History of hyperlipidemia  History of diabetes mellitus  History of sleep apnea - Uses CPAP machine  She is fully vaccinated against COVID-19 and also received a booster.  Orders: Orders Placed This Encounter  Procedures  . XR HIP UNILAT W OR W/O PELVIS 2-3 VIEWS LEFT   No orders of the defined types were placed in this encounter.    Follow-Up Instructions: Return in about 6 months (around  02/17/2021) for Sarcoidosis, Osteoarthritis.   Bo Merino, MD  Note - This record has been created using Editor, commissioning.  Chart creation errors have been sought, but may not always  have been located. Such creation errors do not reflect on  the standard of medical care.

## 2020-08-16 ENCOUNTER — Ambulatory Visit: Payer: Medicare PPO | Admitting: Rheumatology

## 2020-08-16 DIAGNOSIS — G5601 Carpal tunnel syndrome, right upper limb: Secondary | ICD-10-CM

## 2020-08-16 DIAGNOSIS — M79674 Pain in right toe(s): Secondary | ICD-10-CM | POA: Diagnosis not present

## 2020-08-16 DIAGNOSIS — M7711 Lateral epicondylitis, right elbow: Secondary | ICD-10-CM

## 2020-08-16 DIAGNOSIS — Z8679 Personal history of other diseases of the circulatory system: Secondary | ICD-10-CM

## 2020-08-16 DIAGNOSIS — M8589 Other specified disorders of bone density and structure, multiple sites: Secondary | ICD-10-CM

## 2020-08-16 DIAGNOSIS — M5030 Other cervical disc degeneration, unspecified cervical region: Secondary | ICD-10-CM

## 2020-08-16 DIAGNOSIS — Z8639 Personal history of other endocrine, nutritional and metabolic disease: Secondary | ICD-10-CM

## 2020-08-16 DIAGNOSIS — L84 Corns and callosities: Secondary | ICD-10-CM | POA: Diagnosis not present

## 2020-08-16 DIAGNOSIS — Z8669 Personal history of other diseases of the nervous system and sense organs: Secondary | ICD-10-CM

## 2020-08-16 DIAGNOSIS — G603 Idiopathic progressive neuropathy: Secondary | ICD-10-CM | POA: Diagnosis not present

## 2020-08-16 DIAGNOSIS — L603 Nail dystrophy: Secondary | ICD-10-CM | POA: Diagnosis not present

## 2020-08-16 DIAGNOSIS — M19041 Primary osteoarthritis, right hand: Secondary | ICD-10-CM

## 2020-08-16 DIAGNOSIS — M170 Bilateral primary osteoarthritis of knee: Secondary | ICD-10-CM

## 2020-08-16 DIAGNOSIS — D860 Sarcoidosis of lung: Secondary | ICD-10-CM

## 2020-08-17 ENCOUNTER — Ambulatory Visit: Payer: Self-pay

## 2020-08-17 ENCOUNTER — Other Ambulatory Visit: Payer: Self-pay

## 2020-08-17 ENCOUNTER — Encounter: Payer: Self-pay | Admitting: Rheumatology

## 2020-08-17 ENCOUNTER — Ambulatory Visit: Payer: Medicare PPO | Admitting: Rheumatology

## 2020-08-17 VITALS — BP 149/76 | HR 60 | Resp 14 | Ht 59.0 in | Wt 148.8 lb

## 2020-08-17 DIAGNOSIS — D860 Sarcoidosis of lung: Secondary | ICD-10-CM | POA: Diagnosis not present

## 2020-08-17 DIAGNOSIS — M170 Bilateral primary osteoarthritis of knee: Secondary | ICD-10-CM | POA: Diagnosis not present

## 2020-08-17 DIAGNOSIS — M8589 Other specified disorders of bone density and structure, multiple sites: Secondary | ICD-10-CM

## 2020-08-17 DIAGNOSIS — G5601 Carpal tunnel syndrome, right upper limb: Secondary | ICD-10-CM

## 2020-08-17 DIAGNOSIS — Z8639 Personal history of other endocrine, nutritional and metabolic disease: Secondary | ICD-10-CM

## 2020-08-17 DIAGNOSIS — M19042 Primary osteoarthritis, left hand: Secondary | ICD-10-CM

## 2020-08-17 DIAGNOSIS — Z8679 Personal history of other diseases of the circulatory system: Secondary | ICD-10-CM | POA: Diagnosis not present

## 2020-08-17 DIAGNOSIS — M25552 Pain in left hip: Secondary | ICD-10-CM

## 2020-08-17 DIAGNOSIS — M19041 Primary osteoarthritis, right hand: Secondary | ICD-10-CM

## 2020-08-17 DIAGNOSIS — M5030 Other cervical disc degeneration, unspecified cervical region: Secondary | ICD-10-CM

## 2020-08-17 DIAGNOSIS — Z8669 Personal history of other diseases of the nervous system and sense organs: Secondary | ICD-10-CM

## 2020-08-17 DIAGNOSIS — M7711 Lateral epicondylitis, right elbow: Secondary | ICD-10-CM

## 2020-08-17 NOTE — Patient Instructions (Signed)
Iliotibial Band Syndrome Rehab Ask your health care provider which exercises are safe for you. Do exercises exactly as told by your health care provider and adjust them as directed. It is normal to feel mild stretching, pulling, tightness, or discomfort as you do these exercises. Stop right away if you feel sudden pain or your pain gets significantly worse. Do not begin these exercises until told by your health care provider. Stretching and range-of-motion exercises These exercises warm up your muscles and joints and improve the movement and flexibility of your hip and pelvis. Quadriceps stretch, prone 1. Lie on your abdomen (prone position) on a firm surface, such as a bed or padded floor. 2. Bend your left / right knee and reach back to hold your ankle or pant leg. If you cannot reach your ankle or pant leg, loop a belt around your foot and grab the belt instead. 3. Gently pull your heel toward your buttocks. Your knee should not slide out to the side. You should feel a stretch in the front of your thigh and knee (quadriceps). 4. Hold this position for __________ seconds. Repeat __________ times. Complete this exercise __________ times a day.   Iliotibial band stretch An iliotibial band is a strong band of muscle tissue that runs from the outer side of your hip to the outer side of your thigh and knee. 1. Lie on your side with your left / right leg in the top position. 2. Bend both of your knees and grab your left / right ankle. Stretch out your bottom arm to help you balance. 3. Slowly bring your top knee back so your thigh goes behind your trunk. 4. Slowly lower your top leg toward the floor until you feel a gentle stretch on the outside of your left / right hip and thigh. If you do not feel a stretch and your knee will not fall farther, place the heel of your other foot on top of your knee and pull your knee down toward the floor with your foot. 5. Hold this position for __________  seconds. Repeat __________ times. Complete this exercise __________ times a day.   Strengthening exercises These exercises build strength and endurance in your hip and pelvis. Endurance is the ability to use your muscles for a long time, even after they get tired. Straight leg raises, side-lying This exercise strengthens the muscles that rotate the leg at the hip and move it away from your body (hip abductors). 1. Lie on your side with your left / right leg in the top position. Lie so your head, shoulder, hip, and knee line up. You may bend your bottom knee to help you balance. 2. Roll your hips slightly forward so your hips are stacked directly over each other and your left / right knee is facing forward. 3. Tense the muscles in your outer thigh and lift your top leg 4-6 inches (10-15 cm). 4. Hold this position for __________ seconds. 5. Slowly lower your leg to return to the starting position. Let your muscles relax completely before doing another repetition. Repeat __________ times. Complete this exercise __________ times a day.   Leg raises, prone This exercise strengthens the muscles that move the hips backward (hip extensors). 1. Lie on your abdomen (prone position) on your bed or a firm surface. You can put a pillow under your hips if that is more comfortable for your lower back. 2. Bend your left / right knee so your foot is straight up in the air.   3. Squeeze your buttocks muscles and lift your left / right thigh off the bed. Do not let your back arch. 4. Tense your thigh muscle as hard as you can without increasing any knee pain. 5. Hold this position for __________ seconds. 6. Slowly lower your leg to return to the starting position and allow it to relax completely. Repeat __________ times. Complete this exercise __________ times a day. Hip hike 1. Stand sideways on a bottom step. Stand on your left / right leg with your other foot unsupported next to the step. You can hold on to a  railing or wall for balance if needed. 2. Keep your knees straight and your torso square. Then lift your left / right hip up toward the ceiling. 3. Slowly let your left / right hip lower toward the floor, past the starting position. Your foot should get closer to the floor. Do not lean or bend your knees. Repeat __________ times. Complete this exercise __________ times a day. This information is not intended to replace advice given to you by your health care provider. Make sure you discuss any questions you have with your health care provider. Document Revised: 07/29/2019 Document Reviewed: 07/29/2019 Elsevier Patient Education  2021 Elsevier Inc.  

## 2020-08-23 NOTE — Progress Notes (Signed)
   New Patient   Subjective  Mariah Burns is a 75 y.o. female who presents for the following: Rash (Patient has rash that itches all over her body, nothing visible x months. Otc lotion used.).   The following portions of the chart were reviewed this encounter and updated as appropriate:  Tobacco  Allergies  Meds  Problems  Med Hx  Surg Hx  Fam Hx      Objective  Well appearing patient in no apparent distress; mood and affect are within normal limits.  A focused examination was performed including back, chest arms and legs.. Relevant physical exam findings are noted in the Assessment and Plan.  Objective  back, chest, arms and legs: Pt. Indicates that pruritis is intermittent with the majority of it located in the brachial-radial areas bilaterally as well as between the shoulder blades. There is no skin presentation today or ever per patient.   Assessment & Plan  Neurogenic pruritus back, chest, arms and legs  Mentax does have pruritis in the adverse events in the medication's profile.    I, Fumio Vandam, PA-C, have reviewed all documentation for this visit. The documentation on 08/23/20 for the exam, diagnosis, procedures, and orders are all accurate and complete.

## 2020-09-02 DIAGNOSIS — Z170 Estrogen receptor positive status [ER+]: Secondary | ICD-10-CM | POA: Diagnosis not present

## 2020-09-02 DIAGNOSIS — C50911 Malignant neoplasm of unspecified site of right female breast: Secondary | ICD-10-CM | POA: Diagnosis not present

## 2020-09-12 ENCOUNTER — Other Ambulatory Visit: Payer: Self-pay | Admitting: Internal Medicine

## 2020-09-14 ENCOUNTER — Telehealth: Payer: Self-pay | Admitting: Internal Medicine

## 2020-09-14 NOTE — Telephone Encounter (Signed)
Left message asking patient to call office Please r/s 11/02/20 appointment.    See if patient can either come in office or do phone visit 10/05/20.  Pt has [employer] can do calender year

## 2020-09-28 DIAGNOSIS — Z853 Personal history of malignant neoplasm of breast: Secondary | ICD-10-CM | POA: Diagnosis not present

## 2020-09-28 LAB — HM MAMMOGRAPHY

## 2020-09-30 ENCOUNTER — Encounter: Payer: Self-pay | Admitting: Internal Medicine

## 2020-10-18 DIAGNOSIS — H16223 Keratoconjunctivitis sicca, not specified as Sjogren's, bilateral: Secondary | ICD-10-CM | POA: Diagnosis not present

## 2020-10-18 DIAGNOSIS — H2513 Age-related nuclear cataract, bilateral: Secondary | ICD-10-CM | POA: Diagnosis not present

## 2020-11-02 ENCOUNTER — Ambulatory Visit: Payer: Medicare PPO

## 2020-11-02 ENCOUNTER — Encounter: Payer: Self-pay | Admitting: Internal Medicine

## 2020-11-02 ENCOUNTER — Ambulatory Visit: Payer: Medicare PPO | Admitting: Internal Medicine

## 2020-11-02 ENCOUNTER — Other Ambulatory Visit: Payer: Self-pay

## 2020-11-02 ENCOUNTER — Telehealth: Payer: Self-pay | Admitting: *Deleted

## 2020-11-02 VITALS — BP 140/76 | HR 53 | Temp 98.3°F | Ht 59.0 in | Wt 146.6 lb

## 2020-11-02 DIAGNOSIS — F329 Major depressive disorder, single episode, unspecified: Secondary | ICD-10-CM

## 2020-11-02 DIAGNOSIS — E663 Overweight: Secondary | ICD-10-CM

## 2020-11-02 DIAGNOSIS — Z6829 Body mass index (BMI) 29.0-29.9, adult: Secondary | ICD-10-CM

## 2020-11-02 DIAGNOSIS — E1122 Type 2 diabetes mellitus with diabetic chronic kidney disease: Secondary | ICD-10-CM | POA: Diagnosis not present

## 2020-11-02 DIAGNOSIS — C50411 Malignant neoplasm of upper-outer quadrant of right female breast: Secondary | ICD-10-CM

## 2020-11-02 DIAGNOSIS — R413 Other amnesia: Secondary | ICD-10-CM | POA: Diagnosis not present

## 2020-11-02 DIAGNOSIS — I129 Hypertensive chronic kidney disease with stage 1 through stage 4 chronic kidney disease, or unspecified chronic kidney disease: Secondary | ICD-10-CM | POA: Diagnosis not present

## 2020-11-02 DIAGNOSIS — N182 Chronic kidney disease, stage 2 (mild): Secondary | ICD-10-CM | POA: Diagnosis not present

## 2020-11-02 DIAGNOSIS — R682 Dry mouth, unspecified: Secondary | ICD-10-CM | POA: Diagnosis not present

## 2020-11-02 DIAGNOSIS — C773 Secondary and unspecified malignant neoplasm of axilla and upper limb lymph nodes: Secondary | ICD-10-CM

## 2020-11-02 DIAGNOSIS — Z113 Encounter for screening for infections with a predominantly sexual mode of transmission: Secondary | ICD-10-CM | POA: Diagnosis not present

## 2020-11-02 LAB — POCT GLUCOSE (DEVICE FOR HOME USE): POC Glucose: 105 mg/dl — AB (ref 70–99)

## 2020-11-02 NOTE — Chronic Care Management (AMB) (Signed)
  Chronic Care Management   Note  11/02/2020 Name: Mariah Burns MRN: [id number] DOB: July 15, 1945  Mariah Burns is a 75 y.o. year old female who is a primary care patient of Glendale Chard, MD. I reached out to [name] by phone today in response to a referral sent by Ms. Delena E Talley's PCP, Dr. Baird Cancer.      [name] was given information about Chronic Care Management services today including:  1. CCM service includes personalized support from designated clinical staff supervised by her physician, including individualized plan of care and coordination with other care providers 2. 24/7 contact phone numbers for assistance for urgent and routine care needs. 3. Service will only be billed when office clinical staff spend 20 minutes or more in a month to coordinate care. 4. Only one practitioner may furnish and bill the service in a calendar month. 5. The patient may stop CCM services at any time (effective at the end of the month) by phone call to the office staff. 6. The patient will be responsible for cost sharing (co-pay) of up to 20% of the service fee (after annual deductible is met).  Patient agreed to services and verbal consent obtained.   Follow up plan: Telephone appointment with care management team member scheduled for:11/16/2020  [facility]

## 2020-11-02 NOTE — Progress Notes (Signed)
I,Mariah Burns,acting as a Education administrator for Mariah Greenland, MD.,have documented all relevant documentation on the behalf of Mariah Greenland, MD,as directed by  Mariah Greenland, MD while in the presence of Mariah Greenland, MD.  This visit occurred during the SARS-CoV-2 public health emergency.  Safety protocols were in place, including screening questions prior to the visit, additional usage of staff PPE, and extensive cleaning of exam room while observing appropriate contact time as indicated for disinfecting solutions.  Subjective:     Patient ID: Mariah Burns , female    DOB: May 24, 1946 , 75 y.o.   MRN: [id number]   Chief Complaint  Patient presents with   Diabetes   Hypertension    HPI  The patient is here today for a diabetes and blood pressure f/u.  She reports compliance with meds. She denies headaches, chest pain and shortness of breath.   Diabetes She presents for her follow-up diabetic visit. She has type 2 diabetes mellitus. Her disease course has been stable. There are no hypoglycemic associated symptoms. Pertinent negatives for diabetes include no blurred vision. There are no hypoglycemic complications. Diabetic complications include nephropathy. Risk factors for coronary artery disease include diabetes mellitus, dyslipidemia, hypertension, sedentary lifestyle and post-menopausal. She is compliant with treatment most of the time.  Hypertension This is a chronic problem. The current episode started more than 1 year ago. The problem has been gradually improving since onset. The problem is controlled. Pertinent negatives include no blurred vision.    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: failed Imuran & Prednisone  therapy d/t adverse side effects TLC 106%, DLCO 75% 2009     Family History  Problem Relation Age of Onset   Allergies Sister    Heart disease Sister    Lupus Sister    Allergies Mother    Allergies Daughter    Healthy Daughter    Heart disease Sister    Gout Brother    Hypertension Brother    Heart disease Brother    Heart failure Father    Gout Brother    Hypertension Brother    Hypertension Brother    Hypertension Brother    Post-traumatic stress disorder Brother    Healthy Daughter      Current Outpatient Medications:    aspirin EC 81 MG tablet, Take 81 mg by mouth daily. Swallow whole., Disp: , Rfl:    Cholecalciferol (VITAMIN D-3 PO), Take 1,000 Units by mouth daily., Disp: , Rfl:    Cyanocobalamin (VITAMIN B-12 PO), , Disp: , Rfl:    famotidine (PEPCID) 40 MG tablet, as needed. (Patient not taking: Reported on 11/09/2020), Disp: , Rfl:    felodipine (PLENDIL) 10 MG 24 hr tablet, TAKE 1 TABLET BY MOUTH EVERY DAY, Disp: 90 tablet, Rfl: 2   fish oil-omega-3 fatty acids 1000 MG capsule, Take 2 capsules by mouth daily., Disp: , Rfl:    Fluocinolone Acetonide Scalp 0.01 % OIL, Apply to affected area of scalp as directed 1-2 x/day as needed for itching, Disp: 118.28 mL, Rfl: 1   GLUCOSAMINE-CHONDROITIN PO, Take 2 tablets by mouth daily., Disp: , Rfl:    L-Methylfolate-Algae-B12-B6 (METANX)  3-90.314-2-35 MG CAPS, Take 1 capsule by mouth 2 (two) times daily. , Disp: , Rfl:    Menthol, Topical Analgesic, (BIOFREEZE ROLL-ON EX), Apply 1 application topically as needed (pain in back,neck,thumb)., Disp: , Rfl:    mometasone (NASONEX) 50 MCG/ACT nasal spray, Place 2 sprays into the nose daily. As needed, Disp: , Rfl:    nitroGLYCERIN (NITROSTAT) 0.4 MG SL tablet, Place 1 tablet (0.4 mg total) under the tongue every 5 (five) minutes as needed for chest pain., Disp: 25 tablet, Rfl: 2   Polyvinyl Alcohol-Povidone PF 1.4-0.6 % SOLN, Place 1 drop into both eyes daily as needed (dry eyes). ,  Disp: , Rfl:    TART CHERRY PO, , Disp: , Rfl:    TURMERIC-GINGER PO, Take 1 capsule by mouth daily., Disp: , Rfl:    Magnesium 250 MG TABS, Take by mouth daily. (Patient not taking: No sig reported), Disp: , Rfl:    pravastatin (PRAVACHOL) 40 MG tablet, Take 2 tablets (80 mg total) by mouth 3 (three) times a week. Mondays,wednesdays and fridays., Disp: 45 tablet, Rfl: 3   Allergies  Allergen Reactions   Fosamax [Alendronate Sodium] Other (See Comments)    REACTION: "CAUSE GI UPSET AND FATIGUE"   Omeprazole Other (See Comments)    Lower ab pain   Motrin [Ibuprofen] Other (See Comments)    GI upset- has to eat prior to taking   Fluvastatin Other (See Comments)    mild memoryproblems of confusion      Review of Systems  Constitutional: Negative.   HENT:         She c/o dry mouth. Denies change in meds, OTC supplements.   Eyes:  Negative for blurred vision.  Respiratory: Negative.    Cardiovascular: Negative.   Gastrointestinal: Negative.   Neurological:        She reports having change in memory. She does admit to feeling stressed. Her stress involves her children.   Psychiatric/Behavioral:  Positive for dysphoric mood.   All other systems reviewed and are negative.   Today's Vitals   11/02/20 0932  BP: 140/76  Pulse: (!) 53  Temp: 98.3 F (36.8 C)  TempSrc: Oral  Weight: 146 lb 9.6 oz (66.5 kg)  Height: _0  (1.499 m)  PainSc: 0-No pain   Body mass index is 29.61 kg/m.  Wt Readings from Last 3 Encounters:  11/09/20 146 lb (66.2 kg)  11/02/20 146 lb 9.6 oz (66.5 kg)  08/17/20 148 lb 12.8 oz (67.5 kg)   BP Readings from Last 3 Encounters:  11/02/20 140/76  08/17/20 (!) 149/76  08/11/20 (!) 140/56   Objective:  Physical Exam Vitals and nursing note reviewed.  Constitutional:      Appearance: Normal appearance. She is obese.  HENT:     Head: Normocephalic and atraumatic.     Nose:     Comments: Masked     Mouth/Throat:     Comments: Masked   Cardiovascular:     Rate and Rhythm: Normal rate and regular rhythm.     Heart sounds: Normal heart sounds.  Pulmonary:     Effort: Pulmonary effort is normal.     Breath sounds: Normal breath sounds.  Musculoskeletal:     Cervical back: Normal range of motion.  Skin:    General: Skin is warm.  Neurological:     General: No focal deficit present.     Mental Status: She is alert.  Psychiatric:        Mood and Affect:  Mood normal.        Behavior: Behavior normal.        Assessment And Plan:     1. Type 2 diabetes mellitus with stage 2 chronic kidney disease, without long-term current use of insulin (HCC) Comments: Chronic, I will check labs as listed below. I will check CKD labs as well. She agrees to CCM referral.  - BMP8+EGFR - Hemoglobin A1c - Lipid panel - Protein electrophoresis, serum - AMB Referral to [facility] - POCT Glucose (Device for Home Use)  2. Hypertensive nephropathy Comments: Chronic, fair control. Advised to limit her intake of packaged/canned foods which tend to be high in sodium.  - BMP8+EGFR - Lipid panel - AMB Referral to [facility]  3. Reactive depression Comments: She declines therapy and meds at this time. We discussed the use of Trintellix, she declines at this time.   4. Malignant neoplasm of upper-outer quadrant of right female breast, unspecified estrogen receptor status ([place]) Comments: She was diagnosed in 08/2018. She is s/p right lumpectomy with SLNB on 09/02/18 and is s/p adjuvant Radiation therapy.   5. Dry mouth Comments: She is encouraged to increase fluid intake and to consider use of Biotene products. Also advised to increase her intake of healthy fats - walnuts, avocado etc.   6. Memory loss Comments: I will check labs as below. Again, she declines use of Trintellix which may improve cognition while addressing her mood.  - Vitamin B12 - TSH - RPR  7. Overweight with body mass index (BMI) of 29 to  29.9 in adult Comments: Her BMI is acceptable for her demographic. Advised to aim for at least 150 minutes of exercise per week.   Patient was given opportunity to ask questions. Patient verbalized understanding of the plan and was able to repeat key elements of the plan. All questions were answered to their satisfaction.   I, Mariah Greenland, MD, have reviewed all documentation for this visit. The documentation on 11/19/20 for the exam, diagnosis, procedures, and orders are all accurate and complete.   IF YOU HAVE BEEN REFERRED TO A SPECIALIST, IT MAY TAKE 1-2 WEEKS TO SCHEDULE/PROCESS THE REFERRAL. IF YOU HAVE NOT HEARD FROM US/SPECIALIST IN TWO WEEKS, PLEASE GIVE [place] A CALL AT [phone] X [phone].   THE PATIENT IS ENCOURAGED TO PRACTICE SOCIAL DISTANCING DUE TO THE COVID-19 PANDEMIC.

## 2020-11-02 NOTE — Patient Instructions (Signed)
Diabetes Mellitus and Foot Care Foot care is an important part of your health, especially when you have diabetes. Diabetes may cause you to have problems because of poor blood flow (circulation) to your feet and legs, which can cause your skin to:  Become thinner and drier.  Break more easily.  Heal more slowly.  Peel and crack. You may also have nerve damage (neuropathy) in your legs and feet, causing decreased feeling in them. This means that you may not notice minor injuries to your feet that could lead to more serious problems. Noticing and addressing any potential problems early is the best way to prevent future foot problems. How to care for your feet Foot hygiene  Wash your feet daily with warm water and mild soap. Do not use hot water. Then, pat your feet and the areas between your toes until they are completely dry. Do not soak your feet as this can dry your skin.  Trim your toenails straight across. Do not dig under them or around the cuticle. File the edges of your nails with an emery board or nail file.  Apply a moisturizing lotion or petroleum jelly to the skin on your feet and to dry, brittle toenails. Use lotion that does not contain alcohol and is unscented. Do not apply lotion between your toes.   Shoes and socks  Wear clean socks or stockings every day. Make sure they are not too tight. Do not wear knee-high stockings since they may decrease blood flow to your legs.  Wear shoes that fit properly and have enough cushioning. Always look in your shoes before you put them on to be sure there are no objects inside.  To break in new shoes, wear them for just a few hours a day. This prevents injuries on your feet. Wounds, scrapes, corns, and calluses  Check your feet daily for blisters, cuts, bruises, sores, and redness. If you cannot see the bottom of your feet, use a mirror or ask someone for help.  Do not cut corns or calluses or try to remove them with medicine.  If you  find a minor scrape, cut, or break in the skin on your feet, keep it and the skin around it clean and dry. You may clean these areas with mild soap and water. Do not clean the area with peroxide, alcohol, or iodine.  If you have a wound, scrape, corn, or callus on your foot, look at it several times a day to make sure it is healing and not infected. Check for: ? Redness, swelling, or pain. ? Fluid or blood. ? Warmth. ? Pus or a bad smell.   General tips  Do not cross your legs. This may decrease blood flow to your feet.  Do not use heating pads or hot water bottles on your feet. They may burn your skin. If you have lost feeling in your feet or legs, you may not know this is happening until it is too late.  Protect your feet from hot and cold by wearing shoes, such as at the beach or on hot pavement.  Schedule a complete foot exam at least once a year (annually) or more often if you have foot problems. Report any cuts, sores, or bruises to your health care provider immediately. Where to find more information  American Diabetes Association: www.diabetes.org  Association of Diabetes Care & Education Specialists: www.diabeteseducator.org Contact a health care provider if:  You have a medical condition that increases your risk of infection and   you have any cuts, sores, or bruises on your feet.  You have an injury that is not healing.  You have redness on your legs or feet.  You feel burning or tingling in your legs or feet.  You have pain or cramps in your legs and feet.  Your legs or feet are numb.  Your feet always feel cold.  You have pain around any toenails. Get help right away if:  You have a wound, scrape, corn, or callus on your foot and: ? You have pain, swelling, or redness that gets worse. ? You have fluid or blood coming from the wound, scrape, corn, or callus. ? Your wound, scrape, corn, or callus feels warm to the touch. ? You have pus or a bad smell coming from  the wound, scrape, corn, or callus. ? You have a fever. ? You have a red line going up your leg. Summary  Check your feet every day for blisters, cuts, bruises, sores, and redness.  Apply a moisturizing lotion or petroleum jelly to the skin on your feet and to dry, brittle toenails.  Wear shoes that fit properly and have enough cushioning.  If you have foot problems, report any cuts, sores, or bruises to your health care provider immediately.  Schedule a complete foot exam at least once a year (annually) or more often if you have foot problems. This information is not intended to replace advice given to you by your health care provider. Make sure you discuss any questions you have with your health care provider. Document Revised: 12/10/2019 Document Reviewed: 12/10/2019 Elsevier Patient Education  2021 [employer] Inc.  

## 2020-11-04 LAB — BMP8+EGFR
BUN/Creatinine Ratio: 16 (ref 12–28)
BUN: 13 mg/dL (ref 8–27)
CO2: 22 mmol/L (ref 20–29)
Calcium: 9.5 mg/dL (ref 8.7–10.3)
Chloride: 104 mmol/L (ref 96–106)
Creatinine, Ser: 0.83 mg/dL (ref 0.57–1.00)
Glucose: 87 mg/dL (ref 65–99)
Potassium: 3.9 mmol/L (ref 3.5–5.2)
Sodium: 140 mmol/L (ref 134–144)
eGFR: 73 mL/min/{1.73_m2} (ref >59)

## 2020-11-04 LAB — PROTEIN ELECTROPHORESIS, SERUM
A/G Ratio: 1.2 (ref 0.7–1.7)
Albumin ELP: 3.9 g/dL (ref 2.9–4.4)
Alpha 1: 0.2 g/dL (ref 0.0–0.4)
Alpha 2: 0.6 g/dL (ref 0.4–1.0)
Beta: 1.1 g/dL (ref 0.7–1.3)
Gamma Globulin: 1.3 g/dL (ref 0.4–1.8)
Globulin, Total: 3.3 g/dL (ref 2.2–3.9)
Total Protein: 7.2 g/dL (ref 6.0–8.5)

## 2020-11-04 LAB — RPR: RPR Ser Ql: NONREACTIVE

## 2020-11-04 LAB — LIPID PANEL
Chol/HDL Ratio: 3 ratio (ref 0.0–4.4)
Cholesterol, Total: 177 mg/dL (ref 100–199)
HDL: 59 mg/dL (ref >39)
LDL Chol Calc (NIH): 108 mg/dL — ABNORMAL HIGH (ref 0–99)
Triglycerides: 52 mg/dL (ref 0–149)
VLDL Cholesterol Cal: 10 mg/dL (ref 5–40)

## 2020-11-04 LAB — TSH: TSH: 1.17 u[IU]/mL (ref 0.450–4.500)

## 2020-11-04 LAB — HEMOGLOBIN A1C
Est. average glucose Bld gHb Est-mCnc: 120 mg/dL
Hgb A1c MFr Bld: 5.8 % — ABNORMAL HIGH (ref 4.8–5.6)

## 2020-11-04 LAB — VITAMIN B12: Vitamin B-12: >2000 pg/mL — ABNORMAL HIGH (ref 232–1245)

## 2020-11-07 ENCOUNTER — Other Ambulatory Visit: Payer: Self-pay | Admitting: Internal Medicine

## 2020-11-07 MED ORDER — PRAVASTATIN SODIUM 40 MG PO TABS
80.0000 mg | ORAL_TABLET | ORAL | 3 refills | Status: DC
Start: 2020-11-07 — End: 2021-07-07

## 2020-11-09 ENCOUNTER — Other Ambulatory Visit: Payer: Self-pay

## 2020-11-09 ENCOUNTER — Ambulatory Visit (INDEPENDENT_AMBULATORY_CARE_PROVIDER_SITE_OTHER): Payer: Medicare PPO

## 2020-11-09 VITALS — Ht 59.0 in | Wt 146.0 lb

## 2020-11-09 DIAGNOSIS — Z0000 Encounter for general adult medical examination without abnormal findings: Secondary | ICD-10-CM

## 2020-11-09 NOTE — Patient Instructions (Signed)
Mariah Burns , Thank you for taking time to come for your Medicare Wellness Visit. I appreciate your ongoing commitment to your health goals. Please review the following plan we discussed and let me know if I can assist you in the future.   Screening recommendations/referrals: Colonoscopy: not required Mammogram: completed 09/28/2020 Bone Density: completed 01/01/2019 Recommended yearly ophthalmology/optometry visit for glaucoma screening and checkup Recommended yearly dental visit for hygiene and checkup  Vaccinations: Influenza vaccine: completed 02/23/2020, due 01/02/2021 Pneumococcal vaccine: completed 06/11/2019 Tdap vaccine: completed 12/13/2018, due 12/12/2028 Shingles vaccine: discussed   Covid-19: 05/18/2020, 09/08/2019, 08/09/2019  Advanced directives: Please bring a copy of your POA (Power of Attorney) and/or Living Will to your next appointment.   Conditions/risks identified: none  Next appointment: Follow up in one year for your annual wellness visit    Preventive Care 65 Years and Older, Female Preventive care refers to lifestyle choices and visits with your health care provider that can promote health and wellness. What does preventive care include?  A yearly physical exam. This is also called an annual well check.  Dental exams once or twice a year.  Routine eye exams. Ask your health care provider how often you should have your eyes checked.  Personal lifestyle choices, including:  Daily care of your teeth and gums.  Regular physical activity.  Eating a healthy diet.  Avoiding tobacco and drug use.  Limiting alcohol use.  Practicing safe sex.  Taking low-dose aspirin every day.  Taking vitamin and mineral supplements as recommended by your health care provider. What happens during an annual well check? The services and screenings done by your health care provider during your annual well check will depend on your age, overall health, lifestyle risk factors, and  family history of disease. Counseling  Your health care provider may ask you questions about your:  Alcohol use.  Tobacco use.  Drug use.  Emotional well-being.  Home and relationship well-being.  Sexual activity.  Eating habits.  History of falls.  Memory and ability to understand (cognition).  Work and work Statistician.  Reproductive health. Screening  You may have the following tests or measurements:  Height, weight, and BMI.  Blood pressure.  Lipid and cholesterol levels. These may be checked every 5 years, or more frequently if you are over 5 years old.  Skin check.  Lung cancer screening. You may have this screening every year starting at age 44 if you have a 30-pack-year history of smoking and currently smoke or have quit within the past 15 years.  Fecal occult blood test (FOBT) of the stool. You may have this test every year starting at age 20.  Flexible sigmoidoscopy or colonoscopy. You may have a sigmoidoscopy every 5 years or a colonoscopy every 10 years starting at age 58.  Hepatitis C blood test.  Hepatitis B blood test.  Sexually transmitted disease (STD) testing.  Diabetes screening. This is done by checking your blood sugar (glucose) after you have not eaten for a while (fasting). You may have this done every 1-3 years.  Bone density scan. This is done to screen for osteoporosis. You may have this done starting at age 31.  Mammogram. This may be done every 1-2 years. Talk to your health care provider about how often you should have regular mammograms. Talk with your health care provider about your test results, treatment options, and if necessary, the need for more tests. Vaccines  Your health care provider may recommend certain vaccines, such as:  Influenza  vaccine. This is recommended every year.  Tetanus, diphtheria, and acellular pertussis (Tdap, Td) vaccine. You may need a Td booster every 10 years.  Zoster vaccine. You may need this  after age 5.  Pneumococcal 13-valent conjugate (PCV13) vaccine. One dose is recommended after age 20.  Pneumococcal polysaccharide (PPSV23) vaccine. One dose is recommended after age 28. Talk to your health care provider about which screenings and vaccines you need and how often you need them. This information is not intended to replace advice given to you by your health care provider. Make sure you discuss any questions you have with your health care provider. Document Released: 06/17/2015 Document Revised: 02/08/2016 Document Reviewed: 03/22/2015 Elsevier Interactive Patient Education  2017 [place] Prevention in the Home Falls can cause injuries. They can happen to people of all ages. There are many things you can do to make your home safe and to help prevent falls. What can I do on the outside of my home?  Regularly fix the edges of walkways and driveways and fix any cracks.  Remove anything that might make you trip as you walk through a door, such as a raised step or threshold.  Trim any bushes or trees on the path to your home.  Use bright outdoor lighting.  Clear any walking paths of anything that might make someone trip, such as rocks or tools.  Regularly check to see if handrails are loose or broken. Make sure that both sides of any steps have handrails.  Any raised decks and porches should have guardrails on the edges.  Have any leaves, snow, or ice cleared regularly.  Use sand or salt on walking paths during winter.  Clean up any spills in your garage right away. This includes oil or grease spills. What can I do in the bathroom?  Use night lights.  Install grab bars by the toilet and in the tub and shower. Do not use towel bars as grab bars.  Use non-skid mats or decals in the tub or shower.  If you need to sit down in the shower, use a plastic, non-slip stool.  Keep the floor dry. Clean up any water that spills on the floor as soon as it  happens.  Remove soap buildup in the tub or shower regularly.  Attach bath mats securely with double-sided non-slip rug tape.  Do not have throw rugs and other things on the floor that can make you trip. What can I do in the bedroom?  Use night lights.  Make sure that you have a light by your bed that is easy to reach.  Do not use any sheets or blankets that are too big for your bed. They should not hang down onto the floor.  Have a firm chair that has side arms. You can use this for support while you get dressed.  Do not have throw rugs and other things on the floor that can make you trip. What can I do in the kitchen?  Clean up any spills right away.  Avoid walking on wet floors.  Keep items that you use a lot in easy-to-reach places.  If you need to reach something above you, use a strong step stool that has a grab bar.  Keep electrical cords out of the way.  Do not use floor polish or wax that makes floors slippery. If you must use wax, use non-skid floor wax.  Do not have throw rugs and other things on the floor that can  make you trip. What can I do with my stairs?  Do not leave any items on the stairs.  Make sure that there are handrails on both sides of the stairs and use them. Fix handrails that are broken or loose. Make sure that handrails are as long as the stairways.  Check any carpeting to make sure that it is firmly attached to the stairs. Fix any carpet that is loose or worn.  Avoid having throw rugs at the top or bottom of the stairs. If you do have throw rugs, attach them to the floor with carpet tape.  Make sure that you have a light switch at the top of the stairs and the bottom of the stairs. If you do not have them, ask someone to add them for you. What else can I do to help prevent falls?  Wear shoes that:  Do not have high heels.  Have rubber bottoms.  Are comfortable and fit you well.  Are closed at the toe. Do not wear sandals.  If you  use a stepladder:  Make sure that it is fully opened. Do not climb a closed stepladder.  Make sure that both sides of the stepladder are locked into place.  Ask someone to hold it for you, if possible.  Clearly mark and make sure that you can see:  Any grab bars or handrails.  First and last steps.  Where the edge of each step is.  Use tools that help you move around (mobility aids) if they are needed. These include:  Canes.  Walkers.  Scooters.  Crutches.  Turn on the lights when you go into a dark area. Replace any light bulbs as soon as they burn out.  Set up your furniture so you have a clear path. Avoid moving your furniture around.  If any of your floors are uneven, fix them.  If there are any pets around you, be aware of where they are.  Review your medicines with your doctor. Some medicines can make you feel dizzy. This can increase your chance of falling. Ask your doctor what other things that you can do to help prevent falls. This information is not intended to replace advice given to you by your health care provider. Make sure you discuss any questions you have with your health care provider. Document Released: 03/17/2009 Document Revised: 10/27/2015 Document Reviewed: 06/25/2014 Elsevier Interactive Patient Education  2017 [employer].

## 2020-11-09 NOTE — Progress Notes (Signed)
I connected with Mariah Burns today by telephone and verified that I am speaking with the correct person using two identifiers. Location patient: home Location provider: work Persons participating in the virtual visit: Mariah Burns, Glenna Durand LPN.   I discussed the limitations, risks, security and privacy concerns of performing an evaluation and management service by telephone and the availability of in person appointments. I also discussed with the patient that there may be a patient responsible charge related to this service. The patient expressed understanding and verbally consented to this telephonic visit.    Interactive audio and video telecommunications were attempted between this provider and patient, however failed, due to patient having technical difficulties OR patient did not have access to video capability.  We continued and completed visit with audio only.     Vital signs may be patient reported or missing.  Subjective:   Mariah Burns is a 75 y.o. female who presents for Medicare Annual (Subsequent) preventive examination.  Review of Systems     Cardiac Risk Factors include: diabetes mellitus;hypertension     Objective:    Today's Vitals   11/09/20 1227  Weight: 146 lb (66.2 kg)  Height: 4\' 11"  (1.499 m)   Body mass index is 29.49 kg/m.  Advanced Directives 11/09/2020 10/28/2019 08/12/2019 02/02/2019 12/30/2018 10/23/2018 09/24/2018  Does Patient Have a Medical Advance Directive? Yes Yes Yes Yes Yes Yes No  Type of Paramedic of [place];Living will [place];Living will - [place];Living will [place];Living will [place];Living will -  Does patient want to make changes to medical advance directive? - - - - No - Patient declined - -  Copy of [place] in Chart? No - copy requested No - copy requested - - No - copy requested No - copy  requested -    Current Medications (verified) Outpatient Encounter Medications as of 11/09/2020  Medication Sig  . aspirin EC 81 MG tablet Take 81 mg by mouth daily. Swallow whole.  . Cholecalciferol (VITAMIN D-3 PO) Take 1,000 Units by mouth daily.  . Cyanocobalamin (VITAMIN B-12 PO)   . felodipine (PLENDIL) 10 MG 24 hr tablet TAKE 1 TABLET BY MOUTH EVERY DAY  . fish oil-omega-3 fatty acids 1000 MG capsule Take 2 capsules by mouth daily.  . Fluocinolone Acetonide Scalp 0.01 % OIL Apply to affected area of scalp as directed 1-2 x/day as needed for itching  . GLUCOSAMINE-CHONDROITIN PO Take 2 tablets by mouth daily.  Marland Kitchen L-Methylfolate-Algae-B12-B6 (METANX) 3-90.314-2-35 MG CAPS Take 1 capsule by mouth 2 (two) times daily.   . Menthol, Topical Analgesic, (BIOFREEZE ROLL-ON EX) Apply 1 application topically as needed (pain in back,neck,thumb).  . mometasone (NASONEX) 50 MCG/ACT nasal spray Place 2 sprays into the nose daily. As needed  . nitroGLYCERIN (NITROSTAT) 0.4 MG SL tablet Place 1 tablet (0.4 mg total) under the tongue every 5 (five) minutes as needed for chest pain.  . Polyvinyl Alcohol-Povidone PF 1.4-0.6 % SOLN Place 1 drop into both eyes daily as needed (dry eyes).   . pravastatin (PRAVACHOL) 40 MG tablet Take 2 tablets (80 mg total) by mouth 3 (three) times a week. Mondays,wednesdays and fridays.  Marland Kitchen TART CHERRY PO   . TURMERIC-GINGER PO Take 1 capsule by mouth daily.  . famotidine (PEPCID) 40 MG tablet as needed. (Patient not taking: Reported on 11/09/2020)  . Magnesium 250 MG TABS Take by mouth daily. (Patient not taking: No sig  reported)   No facility-administered encounter medications on file as of 11/09/2020.    Allergies (verified) Fosamax [alendronate sodium], Omeprazole, Motrin [ibuprofen], and Fluvastatin   History: Past Medical History:  Diagnosis Date  . Anxiety   . 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 Hyperlipidemia   . Hypertension   . OSA (obstructive sleep  apnea)    NPSG 03/08/2010  AHI 19.8/hr, CPAP nightly  . Osteopenia   . Pre-diabetes   . 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 BREAST LUMPECTOMY WITH RADIOACTIVE SEED AND SENTINEL LYMPH NODE BIOPSY Right 09/02/2018   Procedure: RIGHT BREAST LUMPECTOMY WITH RADIOACTIVE SEED AND  RIGHT SENTINEL LYMPH NODE BIOPSY;  Surgeon: Excell Seltzer, MD;  Location: [facility];  Service: General;  Laterality: Right;  . BRONCHOSCOPY     dx'd sarcoid  . KNEE ARTHROPLASTY Left 03/13/2017  . NEUROPLASTY / TRANSPOSITION ULNAR NERVE AT ELBOW  09/2011  . TOTAL ABDOMINAL HYSTERECTOMY     Family History  Problem Relation Age of Onset  . Allergies Sister   . Heart disease Sister   . Lupus Sister   . Allergies Mother   . Allergies Daughter   . Healthy Daughter   . Heart disease Sister   . Gout Brother   . Hypertension Brother   . Heart disease Brother   . Heart failure Father   . Gout Brother   . Hypertension Brother   . Hypertension Brother   . Hypertension Brother   . Post-traumatic stress disorder Brother   . Healthy Daughter    Social History   Socioeconomic History  . Marital status: Divorced    Spouse name: Not on file  . Number of children: 3  . Years of education: College  . Highest education level: Not on file  Occupational History  . Occupation: Retired    Fish farm manager: RETIRED  Tobacco Use  . Smoking status: Never Smoker  . Smokeless tobacco: Never Used  Vaping Use  . Vaping Use: Never used  Substance and Sexual Activity  . Alcohol use: No  . Drug use: No  . Sexual activity: Not Currently  Other Topics Concern  . Not on file  Social History Narrative   Patient lives at home with daughter.   Caffeine Use: none   Social Determinants of Health   Financial Resource Strain: Low Risk   . Difficulty of Paying  Living Expenses: Not hard at all  Food Insecurity: No Food Insecurity  . Worried About Charity fundraiser in the Last Year: Never true  . Ran Out of Food in the Last Year: Never true  Transportation Needs: No Transportation Needs  . Lack of Transportation (Medical): No  . Lack of Transportation (Non-Medical): No  Physical Activity: Sufficiently Active  . Days of Exercise per Week: 5 days  . Minutes of Exercise per Session: 60 min  Stress: No Stress Concern Present  . Feeling of Stress : Not at all  Social Connections: Not on file    Tobacco Counseling Counseling given: Not Answered   Clinical Intake:  Pre-visit preparation completed: Yes  Pain : No/denies pain     Nutritional Status: BMI 25 -29 Overweight Nutritional Risks: None Diabetes: Yes  How often do you need to have someone help you when you read instructions, pamphlets, or other  written materials from your doctor or pharmacy?: 1 - Never What is the last grade level you completed in school?: college  Diabetic? Nutrition Risk Assessment:  Has the patient had any N/V/D within the last 2 months?  No  Does the patient have any non-healing wounds?  No  Has the patient had any unintentional weight loss or weight gain?  No   Diabetes:  Is the patient diabetic?  Yes  If diabetic, was a CBG obtained today?  No  Did the patient bring in their glucometer from home?  No  How often do you monitor your CBG's? ocassionally.   Financial Strains and Diabetes Management:  Are you having any financial strains with the device, your supplies or your medication? No .  Does the patient want to be seen by Chronic Care Management for management of their diabetes?  No  Would the patient like to be referred to a Nutritionist or for Diabetic Management?  No   Diabetic Exams:  Diabetic Eye Exam: Completed 01/21/2020 Diabetic Foot Exam: Completed 02/23/2020   Interpreter Needed?: No  Information entered by :: NAllen  LPN   Activities of Daily Living In your present state of health, do you have any difficulty performing the following activities: 11/09/2020 11/02/2020  Hearing? Y N  Vision? Y Y  Comment blurry vision -  Difficulty concentrating or making decisions? Tempie Donning  Walking or climbing stairs? N N  Comment depends on hip -  Dressing or bathing? N N  Doing errands, shopping? N N  Preparing Food and eating ? N -  Using the Toilet? N -  In the past six months, have you accidently leaked urine? Y -  Comment one time -  Do you have problems with loss of bowel control? N -  Managing your Medications? N -  Managing your Finances? N -  Housekeeping or managing your Housekeeping? N -  Some recent data might be hidden    Patient Care Team: Glendale Chard, MD as PCP - General (Internal Medicine) Belva Crome, MD as PCP - Cardiology (Cardiology) Mauro Kaufmann, RN as Oncology Nurse Navigator Carlynn Spry, Charlott Holler, RN as Oncology Nurse Navigator Excell Seltzer, MD (Inactive) as Consulting Physician (General Surgery) Truitt Merle, MD as Consulting Physician (Hematology) Gery Pray, MD as Consulting Physician (Radiation Oncology) Alla Feeling, NP as Nurse Practitioner (Nurse Practitioner) Warren Danes, PA-C as Physician Assistant (Dermatology) Daneen Schick as [place] Management Little, Claudette Stapler, RN as [place] any recent [place] you may have received from other than [facility] providers in the past year (date may be approximate).     Assessment:   This is a routine wellness examination for [facility].  Hearing/Vision screen No exam data present  Dietary issues and exercise activities discussed: Current Exercise Habits: Home exercise routine, Type of exercise: walking (water), Time (Minutes): 60, Frequency (Times/Week): 5, Weekly Exercise (Minutes/Week): 300  Goals Addressed            This Visit's Progress   . Patient  Stated       11/09/2020, decrease medication load      Depression Screen PHQ 2/9 Scores 11/09/2020 11/02/2020 10/28/2019 12/11/2018 10/23/2018 06/30/2018 05/15/2018  PHQ - 2 Score 3 6 1  0 0 0 5  PHQ- 9 Score 4 20 4 2 3  - 22    Fall Risk Fall Risk  11/09/2020 11/02/2020 10/28/2019 10/23/2018 06/30/2018  Falls in the past year? 0 0 0  0 0  Risk for fall due to : Medication side effect - Medication side effect Medication side effect -  Follow up Falls evaluation completed;Education provided;Falls prevention discussed - Falls evaluation completed;Education provided;Falls prevention discussed Falls prevention discussed -    FALL RISK PREVENTION PERTAINING TO THE HOME:  Any stairs in or around the home? Yes  If so, are there any without handrails? No  Home free of loose throw rugs in walkways, pet beds, electrical cords, etc? Yes  Adequate lighting in your home to reduce risk of falls? Yes   ASSISTIVE DEVICES UTILIZED TO PREVENT FALLS:  Life alert? No  Use of a cane, walker or w/c? No  Grab bars in the bathroom? No  Shower chair or bench in shower? No  Elevated toilet seat or a handicapped toilet? Yes   TIMED UP AND GO:  Was the test performed? No .      Cognitive Function:     6CIT Screen 11/09/2020 10/28/2019 10/23/2018  What Year? 0 points 0 points 0 points  What month? 0 points 0 points 0 points  What time? 0 points 0 points 0 points  Count back from 20 0 points 0 points 0 points  Months in reverse 0 points 0 points 0 points  Repeat phrase 2 points 0 points 0 points  Total Score 2 0 0    Immunizations Immunization History  Administered Date(s) Administered  . Fluad Quad(high Dose 65+) 02/12/2019, 02/23/2020  . Influenza Split 04/10/2011, 04/15/2012, 03/04/2013, 03/04/2014, 03/05/2015, 03/04/2017  . Influenza Whole 06/13/2010  . Influenza,inj,Quad PF,6+ Mos 03/22/2016  . Influenza-Unspecified 03/22/2016, 03/04/2018  . PFIZER(Purple Top)SARS-COV-2 Vaccination 08/09/2019, 09/08/2019,  05/18/2020  . Pneumococcal Conjugate-13 11/13/2017  . Pneumococcal Polysaccharide-23 06/04/2009, 12/28/2010, 06/11/2019  . Td 05/31/2008  . Tdap 12/13/2018  . Unspecified SARS-COV-2 Vaccination 09/08/2019    TDAP status: Up to date  Flu Vaccine status: Up to date  Pneumococcal vaccine status: Up to date  Covid-19 vaccine status: Completed vaccines  Qualifies for Shingles Vaccine? Yes   Zostavax completed No   Shingrix Completed?: No.    Education has been provided regarding the importance of this vaccine. Patient has been advised to call insurance company to determine out of pocket expense if they have not yet received this vaccine. Advised may also receive vaccine at local pharmacy or Health Dept. Verbalized acceptance and understanding.  Screening Tests Health Maintenance  Topic Date Due  . Pneumococcal Vaccine 25-68 Years old (1 of 4 - PCV13) Never done  . Zoster Vaccines- Shingrix (1 of 2) Never done  . COVID-19 Vaccine (4 - Booster for Pfizer series) 08/16/2020  . INFLUENZA VACCINE  01/02/2021  . OPHTHALMOLOGY EXAM  01/20/2021  . FOOT EXAM  02/22/2021  . URINE MICROALBUMIN  02/22/2021  . HEMOGLOBIN A1C  05/04/2021  . MAMMOGRAM  09/28/2021  . COLONOSCOPY (Pts 45-54yrs Insurance coverage will need to be confirmed)  06/13/2026  . TETANUS/TDAP  12/12/2028  . DEXA SCAN  Completed  . Hepatitis C Screening  Completed  . PNA vac Low Risk Adult  Completed  . HPV VACCINES  Aged Out    Health Maintenance  Health Maintenance Due  Topic Date Due  . Pneumococcal Vaccine 45-37 Years old (1 of 4 - PCV13) Never done  . Zoster Vaccines- Shingrix (1 of 2) Never done  . COVID-19 Vaccine (4 - Booster for Pfizer series) 08/16/2020    Colorectal cancer screening: No longer required.   Mammogram status: Completed 09/28/2020. Repeat every year  Bone  Density status: Completed 01/01/2019.   Lung Cancer Screening: (Low Dose CT Chest recommended if Age 37-80 years, 30 pack-year currently  smoking OR have quit w/in 15years.) does not qualify.   Lung Cancer Screening Referral: no  Additional Screening:  Hepatitis C Screening: does qualify; Completed 05/15/2018  Vision Screening: Recommended annual ophthalmology exams for early detection of glaucoma and other disorders of the eye. Is the patient up to date with their annual eye exam?  Yes  Who is the provider or what is the name of the office in which the patient attends annual eye exams? Dr. Venetia Maxon If pt is not established with a provider, would they like to be referred to a provider to establish care? No .   Dental Screening: Recommended annual dental exams for proper oral hygiene  Community Resource Referral / Chronic Care Management: CRR required this visit?  No   CCM required this visit?  No      Plan:     I have personally reviewed and noted the following in the patient's chart:   . Medical and social history . Use of alcohol, tobacco or illicit drugs  . Current medications and supplements including opioid prescriptions.  . Functional ability and status . Nutritional status . Physical activity . Advanced directives . List of other physicians . Hospitalizations, surgeries, and ER visits in previous 12 months . Vitals . Screenings to include cognitive, depression, and falls . Referrals and appointments  In addition, I have reviewed and discussed with patient certain preventive protocols, quality metrics, and best practice recommendations. A written personalized care plan for preventive services as well as general preventive health recommendations were provided to patient.     Kellie Simmering, LPN   10/04/7480   Nurse Notes:

## 2020-11-16 ENCOUNTER — Telehealth: Payer: Self-pay

## 2020-11-16 ENCOUNTER — Telehealth: Payer: Medicare PPO

## 2020-11-16 NOTE — Telephone Encounter (Signed)
  Care Management   Follow Up Note   11/16/2020 Name: Mariah Burns MRN: [id number] DOB: [birth year]   Referred by: Glendale Chard, MD Reason for referral : Chronic Care Management (Unsuccessful call attempt)   An unsuccessful telephone outreach was attempted today. The patient was referred to the case management team for assistance with care management and care coordination. SW left a HIPAA compliant voice message requesting a return call.  Follow Up Plan: The care management team will reach out to the patient again over the next 30 days.   Daneen Schick, BSW, CDP Social Worker, Certified Dementia Practitioner [place] / [place] [facility] [phone]

## 2020-11-19 DIAGNOSIS — R682 Dry mouth, unspecified: Secondary | ICD-10-CM | POA: Insufficient documentation

## 2020-11-19 DIAGNOSIS — R413 Other amnesia: Secondary | ICD-10-CM | POA: Insufficient documentation

## 2020-11-19 DIAGNOSIS — F4321 Adjustment disorder with depressed mood: Secondary | ICD-10-CM

## 2020-11-19 DIAGNOSIS — F329 Major depressive disorder, single episode, unspecified: Secondary | ICD-10-CM | POA: Insufficient documentation

## 2020-11-19 HISTORY — DX: Dry mouth, unspecified: R68.2

## 2020-11-19 HISTORY — DX: Adjustment disorder with depressed mood: F43.21

## 2020-11-22 DIAGNOSIS — L84 Corns and callosities: Secondary | ICD-10-CM | POA: Diagnosis not present

## 2020-11-22 DIAGNOSIS — G603 Idiopathic progressive neuropathy: Secondary | ICD-10-CM | POA: Diagnosis not present

## 2020-11-22 DIAGNOSIS — L603 Nail dystrophy: Secondary | ICD-10-CM | POA: Diagnosis not present

## 2020-11-22 DIAGNOSIS — M79674 Pain in right toe(s): Secondary | ICD-10-CM | POA: Diagnosis not present

## 2020-11-24 ENCOUNTER — Telehealth: Payer: Self-pay | Admitting: Internal Medicine

## 2020-11-24 NOTE — Telephone Encounter (Signed)
Pt is calling in regards to obtaining CPAP supplies for her mouth and she stated that she is in need of new masks and they informed her that they must speak with Dr. Annamaria Boots before getting new order. Pt states she gets her CPAP supplies from [facility]. Pls rgard; [phone]

## 2020-11-24 NOTE — Telephone Encounter (Signed)
Lm for patient.  

## 2020-12-01 NOTE — Telephone Encounter (Signed)
Lmtcb for pt.  

## 2020-12-07 NOTE — Telephone Encounter (Signed)
Will close encounter per triage protocol as two attempts have been made to reach pt without a return call.

## 2020-12-08 ENCOUNTER — Telehealth: Payer: Self-pay | Admitting: *Deleted

## 2020-12-08 NOTE — Chronic Care Management (AMB) (Signed)
  Care Management   Note  12/08/2020 Name: [name] MRN: [id number] DOB: February 02, 1946  Mariah Burns is a 75 y.o. year old female who is a primary care patient of Glendale Chard, MD and is actively engaged with the care management team. I reached out to [name] by phone today to assist with re-scheduling an initial visit with the BSW  Follow up plan: Unsuccessful telephone outreach attempt made. A HIPAA compliant phone message was left for the patient providing contact information and requesting a return call.  The care management team will reach out to the patient again over the next 7 days.  If patient returns call to provider office, please advise to call [place] at [phone].  [place]

## 2020-12-09 NOTE — Chronic Care Management (AMB) (Signed)
  Care Management   Note  12/09/2020 Name: Mariah Burns MRN: [id number] DOB: Oct 23, 1945  Mariah Burns is a 75 y.o. year old female who is a primary care patient of Glendale Chard, MD and is actively engaged with the care management team. I reached out to [name] by phone today to assist with re-scheduling an initial visit with the BSW  Follow up plan: Telephone appointment with care management team member scheduled for:01/03/2021  Trana Ressler  Care Guide, Embedded Care Coordination [place]  Care Management

## 2020-12-22 ENCOUNTER — Telehealth: Payer: Self-pay | Admitting: Internal Medicine

## 2020-12-22 NOTE — Telephone Encounter (Signed)
I attempted to print download but Mariah Burns seemed to be down. Will keep in triage to check [place] again in the morning.

## 2020-12-22 NOTE — Telephone Encounter (Signed)
Spoke with the pt  She is having dry eyes and mouth  She believes that it's coming from her CPAP and wonders if her CPAP needs to be adjusted  I am in [place] so will someone please print her DL (it's in [place]) and give to CDY and route him this msg, thanks

## 2020-12-23 ENCOUNTER — Other Ambulatory Visit: Payer: Self-pay | Admitting: Internal Medicine

## 2020-12-23 DIAGNOSIS — G4733 Obstructive sleep apnea (adult) (pediatric): Secondary | ICD-10-CM

## 2020-12-23 NOTE — Telephone Encounter (Signed)
Download has been printed. Will bring downstairs to Dr. Annamaria Boots.   Dr. Annamaria Boots, can you please advise? Thanks!

## 2020-12-23 NOTE — Telephone Encounter (Signed)
Order- DME- please change auto range to 4-8. Also please instruct patient how to adjust CPAP humidifier for comfort.

## 2020-12-23 NOTE — Telephone Encounter (Signed)
Patient called regarding call. I spoke with patient regarding Dr. Annamaria Boots recs with CPAP pressure. I informed patient that I have sent the order to [facility] who will change it for her and to let us know if this does not help. Patient verbalized understanding, nothing further needed.

## 2020-12-23 NOTE — Telephone Encounter (Signed)
Called patient but she did not answer. Left message for her to call us back.  

## 2020-12-29 DIAGNOSIS — G4733 Obstructive sleep apnea (adult) (pediatric): Secondary | ICD-10-CM | POA: Diagnosis not present

## 2021-01-03 ENCOUNTER — Ambulatory Visit: Payer: Medicare PPO

## 2021-01-03 ENCOUNTER — Ambulatory Visit (INDEPENDENT_AMBULATORY_CARE_PROVIDER_SITE_OTHER): Payer: Medicare PPO

## 2021-01-03 ENCOUNTER — Telehealth: Payer: Medicare PPO

## 2021-01-03 DIAGNOSIS — E1122 Type 2 diabetes mellitus with diabetic chronic kidney disease: Secondary | ICD-10-CM

## 2021-01-03 DIAGNOSIS — N182 Chronic kidney disease, stage 2 (mild): Secondary | ICD-10-CM

## 2021-01-03 DIAGNOSIS — I129 Hypertensive chronic kidney disease with stage 1 through stage 4 chronic kidney disease, or unspecified chronic kidney disease: Secondary | ICD-10-CM

## 2021-01-03 NOTE — Chronic Care Management (AMB) (Signed)
Chronic Care Management   CCM RN Visit Note  01/03/2021 Name: Mariah Burns MRN: [id number] DOB: 01/22/1946  Subjective: Mariah Burns is a 75 y.o. year old female who is a primary care patient of Glendale Chard, MD. The care management team was consulted for assistance with disease management and care coordination needs.    Collaboration with Daneen Schick BSW  for  Case Collaboration  in response to provider referral for case management and/or care coordination services.   Consent to Services:  The patient was given the following information about Chronic Care Management services today, agreed to services, and gave verbal consent: 1. CCM service includes personalized support from designated clinical staff supervised by the primary care provider, including individualized plan of care and coordination with other care providers 2. 24/7 contact phone numbers for assistance for urgent and routine care needs. 3. Service will only be billed when office clinical staff spend 20 minutes or more in a month to coordinate care. 4. Only one practitioner may furnish and bill the service in a calendar month. 5.The patient may stop CCM services at any time (effective at the end of the month) by phone call to the office staff. 6. The patient will be responsible for cost sharing (co-pay) of up to 20% of the service fee (after annual deductible is met). Patient agreed to services and consent obtained.  Patient agreed to services and verbal consent obtained.   Assessment: Review of patient past medical history, allergies, medications, health status, including review of consultants reports, laboratory and other test data, was performed as part of comprehensive evaluation and provision of chronic care management services.   SDOH (Social Determinants of Health) assessments and interventions performed:    CCM Care Plan  Allergies  Allergen Reactions   Fosamax [Alendronate Sodium] Other (See Comments)     REACTION: "CAUSE GI UPSET AND FATIGUE"   Omeprazole Other (See Comments)    Lower ab pain   Motrin [Ibuprofen] Other (See Comments)    GI upset- has to eat prior to taking   Fluvastatin Other (See Comments)    mild memoryproblems of confusion     Outpatient Encounter Medications as of 01/03/2021  Medication Sig   aspirin EC 81 MG tablet Take 81 mg by mouth daily. Swallow whole.   Cholecalciferol (VITAMIN D-3 PO) Take 1,000 Units by mouth daily.   Cyanocobalamin (VITAMIN B-12 PO)    famotidine (PEPCID) 40 MG tablet as needed. (Patient not taking: Reported on 11/09/2020)   felodipine (PLENDIL) 10 MG 24 hr tablet TAKE 1 TABLET BY MOUTH EVERY DAY   fish oil-omega-3 fatty acids 1000 MG capsule Take 2 capsules by mouth daily.   Fluocinolone Acetonide Scalp 0.01 % OIL Apply to affected area of scalp as directed 1-2 x/day as needed for itching   GLUCOSAMINE-CHONDROITIN PO Take 2 tablets by mouth daily.   L-Methylfolate-Algae-B12-B6 (METANX) 3-90.314-2-35 MG CAPS Take 1 capsule by mouth 2 (two) times daily.    Magnesium 250 MG TABS Take by mouth daily. (Patient not taking: No sig reported)   Menthol, Topical Analgesic, (BIOFREEZE ROLL-ON EX) Apply 1 application topically as needed (pain in back,neck,thumb).   mometasone (NASONEX) 50 MCG/ACT nasal spray Place 2 sprays into the nose daily. As needed   nitroGLYCERIN (NITROSTAT) 0.4 MG SL tablet Place 1 tablet (0.4 mg total) under the tongue every 5 (five) minutes as needed for chest pain.   Polyvinyl Alcohol-Povidone PF 1.4-0.6 % SOLN Place 1 drop into both eyes daily as needed (  dry eyes).    pravastatin (PRAVACHOL) 40 MG tablet Take 2 tablets (80 mg total) by mouth 3 (three) times a week. Mondays,wednesdays and fridays.   TART CHERRY PO    TURMERIC-GINGER PO Take 1 capsule by mouth daily.   No facility-administered encounter medications on file as of 01/03/2021.    Patient Active Problem List   Diagnosis Date Noted   Reactive depression 11/19/2020    Dry mouth 11/19/2020   Memory loss 11/19/2020   Secondary and unspecified malignant neoplasm of axilla and upper limb lymph nodes ([place]) 10/28/2019   Class 1 obesity due to excess calories with serious comorbidity and body mass index (BMI) of 30.0 to 30.9 in adult 10/28/2019   Vitamin D deficiency disease 10/28/2019   Chronic bilateral low back pain without sciatica 10/28/2019   Hip pain, bilateral 10/28/2019   Malignant neoplasm of upper-outer quadrant of right breast in female, estrogen receptor positive ([place]) 08/11/2018   Chronic renal disease, stage II 05/15/2018   Hypertensive nephropathy 05/15/2018   Adult BMI 29.0-29.9 kg/sq m 05/15/2018   Dyspnea on exertion 02/17/2015   Incomplete right bundle branch block 63/33/5456   Systolic murmur 25/63/8937   Pericardial effusion 02/17/2015   Osteopenia 10/05/2011   Type 2 diabetes mellitus with stage 2 chronic kidney disease, without long-term current use of insulin ([place]) 05/06/2010   SARCOIDOSIS 03/21/2007   HYPERLIPIDEMIA 03/21/2007   Obstructive sleep apnea 03/21/2007   Essential hypertension 03/21/2007    Conditions to be addressed/monitored: DM II, Hypertensive Nephropathy  Care Plan : Assist with Chronic Care Management and Care Coordination needs  Updates made by Lynne Logan, RN since 01/03/2021 12:00 AM     Problem: Assist with Chronic Care Management and Care Coordination needs   Priority: High     Long-Range Goal: Assist with Chronic Care Management and Care Coordination needs   Start Date: 01/03/2021  Expected End Date: 03/03/2021  This Visit's Progress: On track  Priority: High  Note:   Current Barriers:  Chronic Disease Management support, education, chronic care management and care coordination needs related to DM II, Hypertensive Nephropathy  with RNCM, SW and Pharmacy Care Management and Care coordination needs Case Manager Clinical Goal(s):  Patient will work with the CCM team to address needs related to  chronic care management and care coordination needs related to DM II, Hypertensive Nephropathy with RNCM, SW and Pharmacy Care Management and Care Coordination needs Interventions:  Collaborated with BSW to initiate plan of care to address needs related to chronic care management and care coordination needs related to DM II, Hypertensive Nephropathy with RNCM, SW and Pharmacy Care Management and Care Coordination needs Collaboration with Glendale Chard, MD regarding development and update of comprehensive plan of care as evidenced by provider attestation and co-signature Inter-disciplinary care team collaboration (see longitudinal plan of care) Patient Goals/Self-Care Activities patient will:   - Patient will work with the CCM team to address chronic care management and care coordination needs and will continue to work with the clinical team to address health care and disease management related needs.   Follow Up Plan: The care management team will reach out to the patient again over the next 30-45 days.       Plan:Telephone follow up appointment with care management team member scheduled for:  01/25/21  Barb Merino, RN, BSN, CCM Care Management Coordinator [facility]  Direct Phone: [phone]

## 2021-01-04 NOTE — Patient Instructions (Signed)
Social Worker Visit Information  Goals we discussed today:   Goals Addressed             This Visit's Progress    Obtain a better understanding of health plan benefits       Timeframe:  Long-Range Goal Priority:  Low Start Date:   8.2.22                          Expected End Date: 11.1.22                      Next planned outreach: 10.4.22  Patient Goals/Self-Care Activities patient will:   - Engage with RN Care Manager -Contact SW as needed prior to next scheduled call         Materials provided: Verbal education about health plan benefits provided by phone  Ms. Jemison was given information about Chronic Care Management services today including:  CCM service includes personalized support from designated clinical staff supervised by her physician, including individualized plan of care and coordination with other care providers 24/7 contact phone numbers for assistance for urgent and routine care needs. Service will only be billed when office clinical staff spend 20 minutes or more in a month to coordinate care. Only one practitioner may furnish and bill the service in a calendar month. The patient may stop CCM services at any time (effective at the end of the month) by phone call to the office staff. The patient will be responsible for cost sharing (co-pay) of up to 20% of the service fee (after annual deductible is met).  Patient agreed to services and verbal consent obtained.   The patient verbalized understanding of instructions, educational materials, and care plan provided today and declined offer to receive copy of patient instructions, educational materials, and care plan.   Follow up plan: SW will follow up with patient by phone over the next 90 days  Daneen Schick, BSW, CDP Social Worker, Certified Dementia Practitioner [place] [facility] [place] [facility] [phone]

## 2021-01-04 NOTE — Chronic Care Management (AMB) (Signed)
Chronic Care Management    Social Work Note  01/03/2021 Name: Mariah Burns MRN: [id number] DOB: 14-Mar-1946  Mariah Burns is a 75 y.o. year old female who is a primary care patient of Glendale Chard, MD. The CCM team was consulted to assist the patient with chronic disease management and/or care coordination needs related to:  DM II and Hypertensive Nephropathy .   Engaged with patient by telephone for initial visit in response to provider referral for social work chronic care management and care coordination services.   Consent to Services:  The patient was given the following information about Chronic Care Management services today, agreed to services, and gave verbal consent: 1. CCM service includes personalized support from designated clinical staff supervised by the primary care provider, including individualized plan of care and coordination with other care providers 2. 24/7 contact phone numbers for assistance for urgent and routine care needs. 3. Service will only be billed when office clinical staff spend 20 minutes or more in a month to coordinate care. 4. Only one practitioner may furnish and bill the service in a calendar month. 5.The patient may stop CCM services at any time (effective at the end of the month) by phone call to the office staff. 6. The patient will be responsible for cost sharing (co-pay) of up to 20% of the service fee (after annual deductible is met). Patient agreed to services and consent obtained.  Patient agreed to services and consent obtained.   Assessment: Review of patient past medical history, allergies, medications, and health status, including review of relevant consultants reports was performed today as part of a comprehensive evaluation and provision of chronic care management and care coordination services.     SDOH (Social Determinants of Health) assessments and interventions performed:  SDOH Interventions    Flowsheet Row Most Recent Value   SDOH Interventions   Food Insecurity Interventions Intervention Not Indicated  Housing Interventions Intervention Not Indicated  Transportation Interventions Intervention Not Indicated        Advanced Directives Status:  Patient does have documents and does not wish to make changes at this time.  CCM Care Plan  Allergies  Allergen Reactions   Fosamax [Alendronate Sodium] Other (See Comments)    REACTION: "CAUSE GI UPSET AND FATIGUE"   Omeprazole Other (See Comments)    Lower ab pain   Motrin [Ibuprofen] Other (See Comments)    GI upset- has to eat prior to taking   Fluvastatin Other (See Comments)    mild memoryproblems of confusion     Outpatient Encounter Medications as of 01/03/2021  Medication Sig   aspirin EC 81 MG tablet Take 81 mg by mouth daily. Swallow whole.   Cholecalciferol (VITAMIN D-3 PO) Take 1,000 Units by mouth daily.   Cyanocobalamin (VITAMIN B-12 PO)    famotidine (PEPCID) 40 MG tablet as needed. (Patient not taking: Reported on 11/09/2020)   felodipine (PLENDIL) 10 MG 24 hr tablet TAKE 1 TABLET BY MOUTH EVERY DAY   fish oil-omega-3 fatty acids 1000 MG capsule Take 2 capsules by mouth daily.   Fluocinolone Acetonide Scalp 0.01 % OIL Apply to affected area of scalp as directed 1-2 x/day as needed for itching   GLUCOSAMINE-CHONDROITIN PO Take 2 tablets by mouth daily.   L-Methylfolate-Algae-B12-B6 (METANX) 3-90.314-2-35 MG CAPS Take 1 capsule by mouth 2 (two) times daily.    Magnesium 250 MG TABS Take by mouth daily. (Patient not taking: No sig reported)   Menthol, Topical Analgesic, (BIOFREEZE ROLL-ON EX)  Apply 1 application topically as needed (pain in back,neck,thumb).   mometasone (NASONEX) 50 MCG/ACT nasal spray Place 2 sprays into the nose daily. As needed   nitroGLYCERIN (NITROSTAT) 0.4 MG SL tablet Place 1 tablet (0.4 mg total) under the tongue every 5 (five) minutes as needed for chest pain.   Polyvinyl Alcohol-Povidone PF 1.4-0.6 % SOLN Place 1 drop  into both eyes daily as needed (dry eyes).    pravastatin (PRAVACHOL) 40 MG tablet Take 2 tablets (80 mg total) by mouth 3 (three) times a week. Mondays,wednesdays and fridays.   TART CHERRY PO    TURMERIC-GINGER PO Take 1 capsule by mouth daily.   No facility-administered encounter medications on file as of 01/03/2021.    Patient Active Problem List   Diagnosis Date Noted   Reactive depression 11/19/2020   Dry mouth 11/19/2020   Memory loss 11/19/2020   Secondary and unspecified malignant neoplasm of axilla and upper limb lymph nodes ([place]) 10/28/2019   Class 1 obesity due to excess calories with serious comorbidity and body mass index (BMI) of 30.0 to 30.9 in adult 10/28/2019   Vitamin D deficiency disease 10/28/2019   Chronic bilateral low back pain without sciatica 10/28/2019   Hip pain, bilateral 10/28/2019   Malignant neoplasm of upper-outer quadrant of right breast in female, estrogen receptor positive ([place]) 08/11/2018   Chronic renal disease, stage II 05/15/2018   Hypertensive nephropathy 05/15/2018   Adult BMI 29.0-29.9 kg/sq m 05/15/2018   Dyspnea on exertion 02/17/2015   Incomplete right bundle branch block 05/39/7673   Systolic murmur 41/93/7902   Pericardial effusion 02/17/2015   Osteopenia 10/05/2011   Type 2 diabetes mellitus with stage 2 chronic kidney disease, without long-term current use of insulin ([place]) 05/06/2010   SARCOIDOSIS 03/21/2007   HYPERLIPIDEMIA 03/21/2007   Obstructive sleep apnea 03/21/2007   Essential hypertension 03/21/2007    Conditions to be addressed/monitored: DMII and Hypertensive Nephropathy ;  knowledge of health plan benefits  Care Plan : Social Work [facility] Care Plan  Updates made by Daneen Schick since 01/04/2021 12:00 AM     Problem: Quality of Life (General Plan of Care)      Long-Range Goal: Obtain a Better Understanding of Health Plan Benefits   Start Date: 01/03/2021  Expected End Date: 04/04/2021  This Visit's Progress: On track   Priority: Low  Note:   Current Barriers:  Chronic disease management support and education needs related to DM and Hypertensive Nephropathy   Limited knowledge of health plan benefit  Social Worker Clinical Goal(s):  patient will work with SW to identify and address any acute and/or chronic care coordination needs related to the self health management of DM and Hypertensive Nephropathy   Patient will work with SW to become more knowledgeable of health plan benefits  SW Interventions:  Inter-disciplinary care team collaboration (see longitudinal plan of care) Collaboration with Glendale Chard, MD regarding development and update of comprehensive plan of care as evidenced by provider attestation and co-signature Successful outbound call placed to the patient to conduct an SDoH screen - no acute challenges noted at this time Discussed the patient has been receiving calls to offer a free life alert system through Medicare benefits- patient is wondering if this is a scam or not Advised the patient SW could assist with a call to her health plan to obtain benefit information Joint call placed to [facility] to determine the patient does not have access to a free personal emergency response system. However the patients health  plan does offer a discounted personal emergency response system Obtained contact information to [facility] [phone] ext: [phone]) Advised by [employer] to use discount code "304-431-1893" Assessed for patient interested in contacting [facility]- patient declines at this time stating she feels she is independent and does not need this service but will save the information for future use Assessed for patient knowledge of GO365 rewards program offered under health plan Determined the patient is not enrolled in GO365 Assisted the patient in contacting her health plans [phone] department - patient completed health assessment and ordered gift cards with reward points Discussed long  term follow up with SW while patient engages with  RN Case Manager  to address care management needs - patient agreeable to plan Scheduled follow up call over the next 90 days  Patient Goals/Self-Care Activities patient will:   -  Engage with RN Care Manager -Contact SW as needed prior to next scheduled call  Follow Up Plan:  SW will follow up with the patient over the next 90 days       Follow Up Plan: SW will follow up with patient by phone over the next 90 days      Daneen Schick, BSW, CDP Social Worker, Certified Dementia Practitioner [place] [facility] [place] [facility] [phone]

## 2021-01-06 ENCOUNTER — Telehealth: Payer: Self-pay | Admitting: *Deleted

## 2021-01-06 NOTE — Chronic Care Management (AMB) (Signed)
  Chronic Care Management   Note  01/06/2021 Name: CARICIA GILFORD MRN: [id number] DOB: 23-Jan-1946  AVIYANNA PIZZOLATO is a 75 y.o. year old female who is a primary care patient of Glendale Chard, MD. ALEKSIS THRUN is currently enrolled in care management services. An additional referral for Pharmacy was sent by [facility].   Follow up plan: Unsuccessful telephone outreach attempt made. A HIPAA compliant phone message was left for the patient providing contact information and requesting a return call.  The care management team will reach out to the patient again over the next 7-14 days.  If patient returns call to provider office, please advise to call [place] at [phone].  [place] [facility]  Direct Dial: [phone]

## 2021-01-09 NOTE — Chronic Care Management (AMB) (Signed)
  Chronic Care Management   Note  01/09/2021 Name: Mariah Burns MRN: [id number] DOB: 1946/03/24  Mariah Burns is a 75 y.o. year old female who is a primary care patient of Glendale Chard, MD. I reached out to [name] by phone today in response to a referral sent by [name]'s PCP Glendale Chard, MD     [name] was given information about Chronic Care Management services today including:  CCM service includes personalized support from designated clinical staff supervised by her physician, including individualized plan of care and coordination with other care providers 24/7 contact phone numbers for assistance for urgent and routine care needs. Service will only be billed when office clinical staff spend 20 minutes or more in a month to coordinate care. Only one practitioner may furnish and bill the service in a calendar month. The patient may stop CCM services at any time (effective at the end of the month) by phone call to the office staff. The patient will be responsible for cost sharing (co-pay) of up to 20% of the service fee (after annual deductible is met).  Patient agreed to services and verbal consent obtained.   Follow up plan: Telephone appointment with care management team member scheduled for: 02/07/2021  [name], [place], [place] [facility]  Direct Dial: [phone]

## 2021-01-19 DIAGNOSIS — H2513 Age-related nuclear cataract, bilateral: Secondary | ICD-10-CM | POA: Diagnosis not present

## 2021-01-19 DIAGNOSIS — H16223 Keratoconjunctivitis sicca, not specified as Sjogren's, bilateral: Secondary | ICD-10-CM | POA: Diagnosis not present

## 2021-01-19 DIAGNOSIS — E119 Type 2 diabetes mellitus without complications: Secondary | ICD-10-CM | POA: Diagnosis not present

## 2021-01-25 ENCOUNTER — Telehealth: Payer: Self-pay

## 2021-01-25 ENCOUNTER — Telehealth: Payer: Medicare PPO

## 2021-01-25 NOTE — Telephone Encounter (Signed)
  Care Management   Follow Up Note   01/25/2021 Name: Mariah Burns MRN: [id number] DOB: [birth year]   Referred by: Glendale Chard, MD Reason for referral : No chief complaint on file.  I spoke with a female answering the phone who stated Ms. Phillipp is not home. She declined to record my phone number and stated I may try Ms. Arvie back at later date.  An unsuccessful telephone outreach was attempted today. The patient was referred to the case management team for assistance with care management and care coordination.   Follow Up Plan: Telephone follow up appointment with care management team member scheduled for: 03/03/21  Barb Merino, RN, BSN, CCM Care Management Coordinator [place] [facility]  Direct Phone: [phone]

## 2021-01-30 ENCOUNTER — Telehealth: Payer: Self-pay | Admitting: Nurse Practitioner

## 2021-01-30 NOTE — Telephone Encounter (Signed)
Rescheduled upcoming appointment due to provider on PAL. Patient is aware of changes.

## 2021-02-01 NOTE — Progress Notes (Deleted)
Office Visit Note  Patient: Mariah Burns             Date of Birth: [birth year]           MRN: [id number]             PCP: Glendale Chard, MD Referring: Glendale Chard, MD Visit Date: 02/15/2021 Occupation: '@GUAROCC'$ @  Subjective:  No chief complaint on file.   History of Present Illness: Mariah Burns is a 75 y.o. female ***   Activities of Daily Living:  Patient reports morning stiffness for *** {minute/hour:19697}.   Patient {ACTIONS;DENIES/REPORTS:21021675::"Denies"} nocturnal pain.  Difficulty dressing/grooming: {ACTIONS;DENIES/REPORTS:21021675::"Denies"} Difficulty climbing stairs: {ACTIONS;DENIES/REPORTS:21021675::"Denies"} Difficulty getting out of chair: {ACTIONS;DENIES/REPORTS:21021675::"Denies"} Difficulty using hands for taps, buttons, cutlery, and/or writing: {ACTIONS;DENIES/REPORTS:21021675::"Denies"}  No Rheumatology ROS completed.   PMFS History:  Patient Active Problem List   Diagnosis Date Noted   Reactive depression 11/19/2020   Dry mouth 11/19/2020   Memory loss 11/19/2020   Secondary and unspecified malignant neoplasm of axilla and upper limb lymph nodes ([place]) 10/28/2019   Class 1 obesity due to excess calories with serious comorbidity and body mass index (BMI) of 30.0 to 30.9 in adult 10/28/2019   Vitamin D deficiency disease 10/28/2019   Chronic bilateral low back pain without sciatica 10/28/2019   Hip pain, bilateral 10/28/2019   Malignant neoplasm of upper-outer quadrant of right breast in female, estrogen receptor positive ([place]) 08/11/2018   Chronic renal disease, stage II 05/15/2018   Hypertensive nephropathy 05/15/2018   Adult BMI 29.0-29.9 kg/sq m 05/15/2018   Dyspnea on exertion 02/17/2015   Incomplete right bundle branch block 0000000   Systolic murmur 0000000   Pericardial effusion 02/17/2015   Osteopenia 10/05/2011   Type 2 diabetes mellitus with stage 2 chronic kidney disease, without long-term current use of insulin  ([place]) 05/06/2010   SARCOIDOSIS 03/21/2007   HYPERLIPIDEMIA 03/21/2007   Obstructive sleep apnea 03/21/2007   Essential hypertension 03/21/2007    Past Medical History:  Diagnosis Date   Anxiety    Cancer ([place]) 08/2018   right breast IDC   Hyperlipidemia    Hypertension    OSA (obstructive sleep apnea)    NPSG 03/08/2010  AHI 19.8/hr, CPAP nightly   Osteopenia    Pre-diabetes    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: failed Imuran & Prednisone therapy d/t adverse side effects TLC 106%, DLCO 75% 2009    Family History  Problem Relation Age of Onset   Allergies Sister    Heart disease Sister    Lupus Sister    Allergies Mother    Allergies Daughter    Healthy Daughter    Heart disease Sister    Gout Brother    Hypertension Brother    Heart disease Brother    Heart failure Father    Gout Brother    Hypertension Brother    Hypertension Brother    Hypertension Brother    Post-traumatic stress disorder Brother    Healthy Daughter    Past Surgical History:  Procedure Laterality Date   BREAST LUMPECTOMY WITH RADIOACTIVE SEED AND SENTINEL LYMPH NODE BIOPSY Right 09/02/2018   Procedure: RIGHT BREAST LUMPECTOMY WITH RADIOACTIVE SEED AND  RIGHT SENTINEL LYMPH NODE BIOPSY;  Surgeon: Excell Seltzer, MD;  Location: [facility];  Service: General;  Laterality: Right;   BRONCHOSCOPY     dx'd sarcoid   KNEE ARTHROPLASTY Left 03/13/2017   NEUROPLASTY / TRANSPOSITION ULNAR NERVE AT ELBOW  09/2011  TOTAL ABDOMINAL HYSTERECTOMY     Social History   Social History Narrative   Patient lives at home with daughter.   Caffeine Use: none   Immunization History  Administered Date(s) Administered   Fluad Quad(high Dose 65+) 02/12/2019, 02/23/2020   Influenza Split 04/10/2011, 04/15/2012, 03/04/2013, 03/04/2014, 03/05/2015, 03/04/2017   Influenza Whole 06/13/2010   Influenza,inj,Quad PF,6+ Mos 03/22/2016   Influenza-Unspecified 03/22/2016, 03/04/2018    PFIZER(Purple Top)SARS-COV-2 Vaccination 08/09/2019, 09/08/2019, 05/18/2020   Pneumococcal Conjugate-13 11/13/2017   Pneumococcal Polysaccharide-23 06/04/2009, 12/28/2010, 06/11/2019   Td 05/31/2008   Tdap 12/13/2018   Unspecified SARS-COV-2 Vaccination 09/08/2019     Objective: Vital Signs: There were no vitals taken for this visit.   Physical Exam   Musculoskeletal Exam: ***  CDAI Exam: CDAI Score: -- Patient Global: --; Provider Global: -- Swollen: --; Tender: -- Joint Exam 02/15/2021   No joint exam has been documented for this visit   There is currently no information documented on the homunculus. Go to the Rheumatology activity and complete the homunculus joint exam.  Investigation: No additional findings.  Imaging: No results found.  Recent Labs: Lab Results  Component Value Date   WBC 4.0 08/11/2020   HGB 12.0 08/11/2020   PLT 199 08/11/2020   NA 140 11/02/2020   K 3.9 11/02/2020   CL 104 11/02/2020   CO2 22 11/02/2020   GLUCOSE 87 11/02/2020   BUN 13 11/02/2020   CREATININE 0.83 11/02/2020   BILITOT 0.4 08/11/2020   ALKPHOS 73 08/11/2020   AST 19 08/11/2020   ALT 16 08/11/2020   PROT 7.2 11/02/2020   ALBUMIN 4.0 08/11/2020   CALCIUM 9.5 11/02/2020   GFRAA 85 05/26/2020    Speciality Comments: No specialty comments available.  Procedures:  No procedures performed Allergies: Fosamax [alendronate sodium], Omeprazole, Motrin [ibuprofen], and Fluvastatin   Assessment / Plan:     Visit Diagnoses: No diagnosis found.  Orders: No orders of the defined types were placed in this encounter.  No orders of the defined types were placed in this encounter.   Face-to-face time spent with patient was *** minutes. Greater than 50% of time was spent in counseling and coordination of care.  Follow-Up Instructions: No follow-ups on file.   Earnestine Mealing, CMA  Note - This record has been created using Editor, commissioning.  Chart creation errors have been  sought, but may not always  have been located. Such creation errors do not reflect on  the standard of medical care.

## 2021-02-02 ENCOUNTER — Telehealth: Payer: Self-pay

## 2021-02-02 ENCOUNTER — Other Ambulatory Visit: Payer: Self-pay

## 2021-02-02 ENCOUNTER — Encounter (HOSPITAL_COMMUNITY): Payer: Self-pay | Admitting: Emergency Medicine

## 2021-02-02 ENCOUNTER — Emergency Department (HOSPITAL_COMMUNITY)
Admission: EM | Admit: 2021-02-02 | Discharge: 2021-02-02 | Disposition: A | Discharge status: Home / Self Care | Payer: Medicare PPO | Attending: Emergency Medicine | Admitting: Emergency Medicine

## 2021-02-02 DIAGNOSIS — N182 Chronic kidney disease, stage 2 (mild): Secondary | ICD-10-CM | POA: Diagnosis not present

## 2021-02-02 DIAGNOSIS — E1122 Type 2 diabetes mellitus with diabetic chronic kidney disease: Secondary | ICD-10-CM | POA: Insufficient documentation

## 2021-02-02 DIAGNOSIS — Z96652 Presence of left artificial knee joint: Secondary | ICD-10-CM | POA: Diagnosis not present

## 2021-02-02 DIAGNOSIS — I129 Hypertensive chronic kidney disease with stage 1 through stage 4 chronic kidney disease, or unspecified chronic kidney disease: Secondary | ICD-10-CM | POA: Diagnosis not present

## 2021-02-02 DIAGNOSIS — Y92410 Unspecified street and highway as the place of occurrence of the external cause: Secondary | ICD-10-CM | POA: Insufficient documentation

## 2021-02-02 DIAGNOSIS — Z79899 Other long term (current) drug therapy: Secondary | ICD-10-CM | POA: Insufficient documentation

## 2021-02-02 DIAGNOSIS — Z853 Personal history of malignant neoplasm of breast: Secondary | ICD-10-CM | POA: Diagnosis not present

## 2021-02-02 DIAGNOSIS — Z7982 Long term (current) use of aspirin: Secondary | ICD-10-CM | POA: Insufficient documentation

## 2021-02-02 DIAGNOSIS — Z041 Encounter for examination and observation following transport accident: Secondary | ICD-10-CM | POA: Insufficient documentation

## 2021-02-02 DIAGNOSIS — R519 Headache, unspecified: Secondary | ICD-10-CM | POA: Diagnosis not present
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
## 2021-02-02 NOTE — ED Notes (Signed)
Patient refused discharge vital signs. 

## 2021-02-02 NOTE — Discharge Instructions (Addendum)
Tylenol if needed.  Return if any problems.
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
## 2021-02-07 ENCOUNTER — Ambulatory Visit (INDEPENDENT_AMBULATORY_CARE_PROVIDER_SITE_OTHER): Payer: Medicare PPO

## 2021-02-07 DIAGNOSIS — M8589 Other specified disorders of bone density and structure, multiple sites: Secondary | ICD-10-CM

## 2021-02-07 DIAGNOSIS — E7800 Pure hypercholesterolemia, unspecified: Secondary | ICD-10-CM

## 2021-02-07 DIAGNOSIS — I129 Hypertensive chronic kidney disease with stage 1 through stage 4 chronic kidney disease, or unspecified chronic kidney disease: Secondary | ICD-10-CM

## 2021-02-07 NOTE — Progress Notes (Signed)
Current Barriers:  Unable to independently monitor therapeutic efficacy  Pharmacist Clinical Goal(s):  Patient will achieve adherence to monitoring guidelines and medication adherence to achieve therapeutic efficacy through collaboration with PharmD and provider.   Interventions: 1:1 collaboration with Glendale Chard, MD regarding development and update of comprehensive plan of care as evidenced by provider attestation and co-signature Inter-disciplinary care team collaboration (see longitudinal plan of care) Comprehensive medication review performed; medication list updated in electronic medical record  Hypertension (BP goal <140/90) -Not ideally controlled -Current treatment: Felodipine 10 mg once daily  -Medications previously tried: none noted  -Current home readings: she is checking her BP sometimes at home. Her last BP readings 01/06/2021 145/62, and pulse was 58. 8/15: 149/61 pulse: 64 -Current dietary habits: please see cholesterol, She does not use any salt packets on her food.  -Current exercise habits: patient reports exercising 5-6 days per week. On Monday and Wednesday she and her daughter do stationary exercise, Tuesday and Thursday she goes to the pool for 30 minutes , Fridays she walks in the gym - she sometimes walks and sits for a bit and walks 5 times around the lap  -Denies hypotensive/hypertensive symptoms -Educated on Daily salt intake goal < 2300 mg; Exercise goal of 150 minutes per week; -Counseled to monitor BP at home three times per week, document, and provide log at future appointments -Recommended to continue current medication Educated on the importance of checking her BP at home   Hyperlipidemia: (LDL goal < 70) -Controlled -Current treatment: Pravastatin 80 mg tablet on Monday, Wednesday and Friday.  Aspirin 81 mg tablet once per day  -Medications previously tried: Pravastatin 40 mg,   -Current dietary patterns: she is eating on regular basis, she usually  has a boiled egg, and a piece of toast, or banana nut muffin. She usually has green tea in the morning and golden milk at night. She avoids eating past 5 pm. She usually skips lunch because she eats breakfast so late. She eats out frequently she has been to every restaurant in [place]. Sometimes she goes TO [employer] and has meat loaf, or sometimes [employer]. She also goes to [employer] and gets [place]. Sometimes they cook at home and she makes beans, or all beef hot dogs, sometimes she has the eight piece baked chicken. She also goes to [employer] [employer] where they make the best fried chicken. She reports not eating all of these things all the time.  -Current exercise habits: patient reports exercising 5-6 days per week. On Monday and Wednesday she and her daughter do stationary exercise, Tuesday and Thursday she goes to the pool for 30 minutes , Fridays she walks in the gym - she sometimes walks and sits for a bit and walks 5 times around the lap  -Educated on Benefits of statin for ASCVD risk reduction; Importance of limiting foods high in cholesterol; Exercise goal of 150 minutes per week; -Recommended to continue current medication  Osteoporosis / Osteopenia (Goal: reduce bone loss) -Controlled -Last DEXA Scan: -1.3   T-Score femoral neck: -1.3  T-Score lumbar spine: -0.9  10-year probability of major osteoporotic fracture: 4.5%  10-year probability of hip fracture: 0.7%  -Patient is a candidate for pharmacologic treatment due to T-Score -1.0 to -2.5 and 10-year risk of hip fracture > 3% -Current treatment  Calcium + Vitamin D (500 mg - 1000 IU) gummies once per day  -Medications previously tried: None noted  -Recommend [phone] units of vitamin D daily. Recommend 1200 mg of  calcium daily from dietary and supplemental sources. Recommend weight-bearing and muscle strengthening exercises for building and maintaining bone density. -Recommended to continue current medication  -Collaborate  with PCP team to determine if patient is a good candidate for oral bisphonates due to T-Score and fracture risk.   Health Maintenance -Vaccine gaps: [facility] Vaccine, COVID-19 Booster Vaccine, Influenza Vaccine  -Educated on Supplements may interfere with prescription drugs -Patient is satisfied with current therapy and denies issues -Recommended to continue current medication  Patient Goals/Self-Care Activities Patient will:  - take medications as prescribed  Follow Up Plan: Telephone follow up appointment with care management team member scheduled for: The patient has been provided with contact information for the care management team and has been advised to call with any health related questions or concerns.    Chronic Care Management Pharmacy Note  02/15/2021 Name:  Mariah Burns MRN:  [id number] DOB:  10/27/1975  Summary: Patient reports   Recommendations/Changes made from today's visit: Recommend patient receive shingrix vaccine.   Plan: Patient reports she is going to get the shingrix vaccine.    Subjective: Mariah Burns is an 75 y.o. year old female who is a primary patient of Glendale Chard, MD.  The CCM team was consulted for assistance with disease management and care coordination needs. 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 She has three daughters and one of them has special needs, her husband is deceased. She never remarried. She wakes up as late as she can because she goes to bed late. She wakes up around 3 am and 4 am and she would like to stay asleep longer.   Engaged with patient by telephone for initial visit in response to provider referral for pharmacy case management and/or care coordination services.   Consent to Services:  The patient was given the following information about Chronic Care Management services today, agreed to services, and gave verbal consent: 1. CCM service includes  personalized support from designated clinical staff supervised by the primary care provider, including individualized plan of care and coordination with other care providers 2. 24/7 contact phone numbers for assistance for urgent and routine care needs. 3. Service will only be billed when office clinical staff spend 20 minutes or more in a month to coordinate care. 4. Only one practitioner may furnish and bill the service in a calendar month. 5.The patient may stop CCM services at any time (effective at the end of the month) by phone call to the office staff. 6. The patient will be responsible for cost sharing (co-pay) of up to 20% of the service fee (after annual deductible is met). Patient agreed to services and consent obtained.  Patient Care Team: Glendale Chard, MD as PCP - General (Internal Medicine) Belva Crome, MD as PCP - Cardiology (Cardiology) Mauro Kaufmann, RN as Oncology Nurse Navigator Carlynn Spry, Charlott Holler, RN as Oncology Nurse Navigator Excell Seltzer, MD (Inactive) as Consulting Physician (General Surgery) Truitt Merle, MD as Consulting Physician (Hematology) Gery Pray, MD as Consulting Physician (Radiation Oncology) Alla Feeling, NP as Nurse Practitioner (Nurse Practitioner) Warren Danes, PA-C as Physician Assistant (Dermatology) Daneen Schick as [place] [facility] [place], Claudette Stapler, RN as [place], Sharyn Blitz, [facility] (Pharmacist)  Recent office visits:   11-02-2020 Glendale Chard, MD. A1C= 5.8. LDL= 108. POCT glucose (Home device)= 105. B12= >2,000   Recent consult visits:  01-03-2021 [place], [place]  L, RN (CCM)   01-03-2021 Daneen Schick (CCM)   11-09-2020 Kellie Simmering, LPN. Medicare Wellness. 08-17-2020 Bo Merino, MD (Rheumatology). XR hip ordered.   08-11-2020 Truitt Merle, MD (Oncology). Follow up in 6 months   Hospital visits:  None in previous 6 months   Objective:  Lab Results   Component Value Date   CREATININE 0.83 11/02/2020   BUN 13 11/02/2020   GFRNONAA 54 (L) 08/11/2020   GFRAA 85 05/26/2020   NA 140 11/02/2020   K 3.9 11/02/2020   CALCIUM 9.5 11/02/2020   CO2 22 11/02/2020   GLUCOSE 87 11/02/2020    Lab Results  Component Value Date/Time   HGBA1C 5.8 (H) 11/02/2020 10:36 AM   HGBA1C 5.7 (H) 05/26/2020 12:32 PM   MICROALBUR 30 02/23/2020 01:26 PM   MICROALBUR 30 10/28/2019 01:43 PM    Last diabetic Eye exam:  Lab Results  Component Value Date/Time   HMDIABEYEEXA No Retinopathy 01/21/2020 12:00 AM    Last diabetic Foot exam: No results found for: HMDIABFOOTEX   Lab Results  Component Value Date   CHOL 177 11/02/2020   HDL 59 11/02/2020   LDLCALC 108 (H) 11/02/2020   TRIG 52 11/02/2020   CHOLHDL 3.0 11/02/2020    Hepatic Function Latest Ref Rng & Units 11/02/2020 08/11/2020 02/23/2020  Total Protein 6.0 - 8.5 g/dL 7.2 8.1 7.3  Albumin 3.5 - 5.0 g/dL - 4.0 4.4  AST 15 - 41 U/L - 19 20  ALT 0 - 44 U/L - 16 15  Alk Phosphatase 38 - 126 U/L - 73 78  Total Bilirubin 0.3 - 1.2 mg/dL - 0.4 0.3  Bilirubin, Direct 0.00 - 0.40 mg/dL - - -    Lab Results  Component Value Date/Time   TSH 1.170 11/02/2020 10:36 AM   TSH 1.240 06/11/2019 02:24 PM    CBC Latest Ref Rng & Units 08/11/2020 02/23/2020 08/12/2019  WBC 4.0 - 10.5 K/uL 4.0 3.8 3.6(L)  Hemoglobin 12.0 - 15.0 g/dL 12.0 12.0 11.8(L)  Hematocrit 36.0 - 46.0 % 36.9 37.0 35.4(L)  Platelets 150 - 400 K/uL 199 178 168    Lab Results  Component Value Date/Time   VD25OH 43.0 10/28/2019 11:37 AM    Clinical ASCVD: No  The ASCVD Risk score (Arnett DK, et al., 2019) failed to calculate for the following reasons:   Unable to determine if patient is Non-Hispanic African American    Depression screen [facility] 2/9 02/14/2021 11/09/2020 11/02/2020  Decreased Interest 0 0 -  Down, Depressed, Hopeless 0 3 -  PHQ - 2 Score 0 3 -  Altered sleeping 2 1 -  Tired, decreased energy 0 0 -  Change in appetite 0  0 -  Feeling bad or failure about yourself  0 0 -  Trouble concentrating 0 0 -  Moving slowly or fidgety/restless 0 0 -  Suicidal thoughts 0 0 0  PHQ-9 Score 2 4 -  Difficult doing work/chores Not difficult at all Not difficult at all -  Some recent data might be hidden     DEXA: most current DEXA scan T-score is minus -1.7.   Social History   Tobacco Use  Smoking Status Never  Smokeless Tobacco Never   BP Readings from Last 3 Encounters:  02/02/21 (!) 152/69  11/02/20 140/76  08/17/20 (!) 149/76   Pulse Readings from Last 3 Encounters:  02/02/21 61  11/02/20 (!) 53  08/17/20 60   Wt Readings from Last 3 Encounters:  02/02/21 148 lb (  67.1 kg)  11/09/20 146 lb (66.2 kg)  11/02/20 146 lb 9.6 oz (66.5 kg)   BMI Readings from Last 3 Encounters:  02/02/21 29.89 kg/m  11/09/20 29.49 kg/m  11/02/20 29.61 kg/m    Assessment/Interventions: Review of patient past medical history, allergies, medications, health status, including review of consultants reports, laboratory and other test data, was performed as part of comprehensive evaluation and provision of chronic care management services.   SDOH:  (Social Determinants of Health) assessments and interventions performed: Yes  SDOH Screenings   Alcohol Screen: Not on file  Depression (PHQ2-9): Low Risk    PHQ-2 Score: 2  Financial Resource Strain: Low Risk    Difficulty of Paying Living Expenses: Not hard at all  Food Insecurity: No Food Insecurity   Worried About Charity fundraiser in the Last Year: Never true   Ran Out of Food in the Last Year: Never true  Housing: Low Risk    Last Housing Risk Score: 0  Physical Activity: Sufficiently Active   Days of Exercise per Week: 5 days   Minutes of Exercise per Session: 60 min  Social Connections: Not on file  Stress: No Stress Concern Present   Feeling of Stress : Not at all  Tobacco Use: Low Risk    Smoking Tobacco Use: Never   Smokeless Tobacco Use: Never   Transportation Needs: No Transportation Needs   Lack of Transportation (Medical): No   Lack of Transportation (Non-Medical): No    CCM Care Plan  Allergies  Allergen Reactions   Fosamax [Alendronate Sodium] Other (See Comments)    REACTION: "CAUSE GI UPSET AND FATIGUE"   Omeprazole Other (See Comments)    Lower ab pain   Motrin [Ibuprofen] Other (See Comments)    GI upset- has to eat prior to taking   Fluvastatin Other (See Comments)    mild memoryproblems of confusion     Medications Reviewed Today     Reviewed by Minette Brine, FNP (Family Nurse Practitioner) on 02/14/21 at [place] List Status: <None>   Medication Order Taking? Sig Documenting Provider Last Dose Status Informant  aspirin EC 81 MG tablet [id number] Yes Take 81 mg by mouth daily. Swallow whole. [provider] Taking Active   calcium carbonate (TUMS - DOSED IN MG ELEMENTAL CALCIUM) 500 MG chewable tablet [id number] Yes Chew 1 tablet by mouth daily. Taking daily with Vitamin D 1000 units. [provider] Taking Active   Cholecalciferol (VITAMIN D-3 PO) [id number] Yes Take 1,000 Units by mouth daily. [provider] Taking Active   Cyanocobalamin (VITAMIN B-12 PO) [id number] Yes  [provider] Taking Active   famotidine (PEPCID) 40 MG tablet [id number] Yes as needed. [provider] Taking Active   felodipine (PLENDIL) 10 MG 24 hr tablet [id number] Yes TAKE 1 TABLET BY MOUTH EVERY DAY Glendale Chard, MD Taking Active   fish oil-omega-3 fatty acids 1000 MG capsule [id number] Yes Take 2 capsules by mouth daily. [provider] Taking Active   Fluocinolone Acetonide Scalp 0.01 % OIL [id number] Yes Apply to affected area of scalp as directed 1-2 x/day as needed for itching Glendale Chard, MD Taking Active   GLUCOSAMINE-CHONDROITIN PO [id number] Yes Take 2 tablets by mouth daily. [provider] Taking Active   L-Methylfolate-Algae-B12-B6 Glade Stanford) 3-90.314-2-35 MG  CAPS [id number] Yes Take 1 capsule by mouth 2 (two) times daily.  [provider] Taking Active            Med Note Patsey Berthold, DANIELLE  R   Tue Aug 19, 2018  9:17 AM)    Magnesium 250 MG TABS [id number] Yes Take by mouth daily. [provider] Taking Active   Menthol, Topical Analgesic, (BIOFREEZE ROLL-ON EX) [id number] Yes Apply 1 application topically as needed (pain in back,neck,thumb). [provider] Taking Active   mometasone (NASONEX) 50 MCG/ACT nasal spray [id number] Yes Place 2 sprays into the nose daily. As needed [provider] Taking Active Self  nitroGLYCERIN (NITROSTAT) 0.4 MG SL tablet [id number] No Place 1 tablet (0.4 mg total) under the tongue every 5 (five) minutes as needed for chest pain.  Patient not taking: Reported on 02/14/2021   Isaiah Serge, NP Not Taking Active   Polyvinyl Alcohol-Povidone PF 1.4-0.6 % SOLN [id number] Yes Place 1 drop into both eyes daily as needed (dry eyes).  [provider] Taking Active   pravastatin (PRAVACHOL) 40 MG tablet [id number] Yes Take 2 tablets (80 mg total) by mouth 3 (three) times a week. Mondays,wednesdays and fridays. Glendale Chard, MD Taking Active   TART [place] [id number] Yes  [provider] Taking Active   TURMERIC-GINGER PO [id number] Yes Take 1 capsule by mouth daily. 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[place]) 10/28/2019   Class 1 obesity due to excess calories with serious comorbidity and body mass index (BMI) of 30.0 to 30.9 in adult 10/28/2019   Vitamin D deficiency disease 10/28/2019   Chronic bilateral low back pain without sciatica 10/28/2019   Hip pain, bilateral 10/28/2019    Malignant neoplasm of upper-outer quadrant of right breast in female, estrogen receptor positive ([place]) 08/11/2018   Chronic renal disease, stage II 05/15/2018   Hypertensive nephropathy 05/15/2018   Adult BMI 29.0-29.9 kg/sq m 05/15/2018   Dyspnea on exertion 02/17/2015   Incomplete right bundle branch block 99/83/3825   Systolic murmur 05/39/7673   Pericardial effusion 02/17/2015   Osteopenia 10/05/2011   Type 2 diabetes mellitus with stage 2 chronic kidney disease, without long-term current use of insulin ([place]) 05/06/2010   SARCOIDOSIS 03/21/2007   HYPERLIPIDEMIA 03/21/2007   Obstructive sleep apnea 03/21/2007   Essential hypertension 03/21/2007    Immunization History  Administered Date(s) Administered   Fluad Quad(high Dose 65+) 02/12/2019, 02/23/2020   Influenza Split 04/10/2011, 04/15/2012, 03/04/2013, 03/04/2014, 03/05/2015, 03/04/2017   Influenza Whole 06/13/2010   Influenza,inj,Quad PF,6+ Mos 03/22/2016   Influenza-Unspecified 03/22/2016, 03/04/2018   PFIZER(Purple Top)SARS-COV-2 Vaccination 08/09/2019, 09/08/2019, 05/18/2020   Pneumococcal Conjugate-13 11/13/2017   Pneumococcal Polysaccharide-23 06/04/2009, 12/28/2010, 06/11/2019   Td 05/31/2008   Tdap 12/13/2018   Unspecified SARS-COV-2 Vaccination 09/08/2019    Conditions to be addressed/monitored:  Hypertension, Hyperlipidemia, and Osteopenia  Care Plan : [place]  Updates made by Mayford Knife, [place] since 02/15/2021 12:00 AM     Problem: HTN, HLD, Osteoperosis/Osteopenia   Priority: High     Goal: Patient Stated   This Visit's Progress: On track  Priority: High  Note:   Current Barriers:  Unable to independently monitor therapeutic efficacy  Pharmacist Clinical Goal(s):  Patient will achieve adherence to monitoring guidelines and medication adherence to achieve therapeutic efficacy through collaboration with PharmD and  provider.   Interventions: 1:1 collaboration with Glendale Chard, MD regarding development and update of comprehensive plan of care as evidenced by provider attestation and co-signature Inter-disciplinary care team collaboration (see longitudinal plan of care) Comprehensive medication review performed; medication list updated in electronic medical record  Hypertension (BP goal <140/90) -Not ideally controlled -Current treatment: Felodipine 10 mg once daily  -Medications previously tried: none noted  -Current home readings: she is checking her BP sometimes at home. Her last BP readings 01/06/2021 145/62, and pulse was 58. 8/15: 149/61 pulse: 64 -Current dietary habits: please see cholesterol, She does not use any salt packets on her food.  -Current exercise habits: patient reports exercising 5-6 days per week. On Monday and Wednesday she and her daughter do stationary exercise, Tuesday and Thursday she goes to the pool for 30 minutes , Fridays she walks in the gym - she sometimes walks and sits for a bit and walks 5 times around the lap  -Denies hypotensive/hypertensive symptoms -Educated on Daily salt intake goal < 2300 mg; Exercise goal of 150 minutes per week; -Counseled to monitor BP at home three times per week, document, and provide log at future appointments -Recommended to continue current medication Educated on the importance of checking her BP at home   Hyperlipidemia: (LDL goal < 70) -Controlled -Current treatment: Pravastatin 80 mg tablet on Monday, Wednesday and Friday.  Aspirin 81 mg tablet once per day  Fish Oil 1000 mg capsule-take 2 capsules by mouth daily -Medications previously tried: Pravastatin 40 mg,   -Current dietary patterns: she is eating on regular basis, she usually has a boiled egg, and a piece of toast, or banana nut muffin. She usually has green tea in the morning and golden milk at night. She avoids eating past 5 pm. She usually skips lunch because she eats breakfast so late. She eats out frequently she has been to  every restaurant in [place]. Sometimes she goes TO [employer] and has meat loaf, or sometimes [employer]. She also goes to [employer] and gets [place]. Sometimes they cook at home and she makes beans, or all beef hot dogs, sometimes she has the eight piece baked chicken. She also goes to [employer] [employer] where they make the best fried chicken. She reports not eating all of these things all the time.  -Current exercise habits: patient reports exercising 5-6 days per week. On Monday and Wednesday she and her daughter do stationary exercise, Tuesday and Thursday she goes to the pool for 30 minutes , Fridays she walks in the gym - she sometimes walks and sits for a bit and walks 5 times around the lap  -Educated on Benefits of statin for ASCVD risk reduction; Importance of limiting foods high in cholesterol; Exercise goal of 150 minutes per week; -Recommended to continue current medication  Osteoporosis / Osteopenia (Goal: reduce bone loss) -Controlled -Last DEXA Scan: -1.3   T-Score femoral neck: -1.3  T-Score lumbar spine: -0.9  10-year probability of major osteoporotic fracture: 4.5%  10-year probability of hip fracture: 0.7%  -Patient is a candidate for pharmacologic treatment due to T-Score -1.0 to -2.5 and 10-year risk of hip fracture > 3% -Current treatment  Calcium + Vitamin D (500 mg - 1000 IU) gummies once per day  -Medications previously tried: None noted  -Recommend [phone] units of vitamin D daily. Recommend 1200 mg of calcium daily from dietary and supplemental sources. Recommend weight-bearing and muscle strengthening exercises for building and maintaining bone density. -Recommended to continue  current medication -Collaborate with PCP team to determine if patient is a good candidate for Prolia, due T-score and fracture risk, and intolerance to Fosamax.    Health Maintenance -Vaccine gaps: [facility] Vaccine, COVID-19 Booster Vaccine, Influenza Vaccine  -Educated on Supplements  may interfere with prescription drugs -Patient is satisfied with current therapy and denies issues -Recommended to continue current medication  Patient Goals/Self-Care Activities Patient will:  - take medications as prescribed  Follow Up Plan: Telephone follow up appointment with care management team member scheduled for: The patient has been provided with contact information for the care management team and has been advised to call with any health related questions or concerns.       Medication Assistance: None required.  Patient affirms current coverage meets needs.  Compliance/Adherence/Medication fill history: Care Gaps: Shingrix Vaccine COVID-19 Vaccine Booster  Star-Rating Drugs: Pravastatin 40 mg tablet   Patient's preferred pharmacy is:  [id number]- [place], [place] - [id number]: [phone]: [phone] Uses pill box? No - patient is using vials  Pt endorses 85% compliance  We discussed: Benefits of medication synchronization, packaging and delivery as well as enhanced pharmacist oversight with [employer]. Patient decided to: Continue current medication management strategy  Care Plan and Follow Up Patient Decision:  Patient agrees to Care Plan and Follow-up.  Plan: Telephone follow up appointment with care management team member scheduled for:  08/08/2021 and The patient has been provided with contact information for the care management team and has been advised to call with any health related questions or concerns.   VOrlando Penner PharmD Clinical Pharmacist [facility] [phone]

## 2021-02-09 ENCOUNTER — Ambulatory Visit: Payer: Medicare PPO | Admitting: Nurse Practitioner

## 2021-02-09 ENCOUNTER — Other Ambulatory Visit: Payer: Medicare PPO

## 2021-02-09 ENCOUNTER — Telehealth: Payer: Self-pay

## 2021-02-09 DIAGNOSIS — Z20822 Contact with and (suspected) exposure to covid-19: Secondary | ICD-10-CM | POA: Diagnosis not present

## 2021-02-09 NOTE — Telephone Encounter (Signed)
The patient was wanted to know what she can take for sinus issues.  The pt was notified that she needed to take a covid test, the pt stated she has home kits and will take a covid test.  The pt was told to take zyrtec in the am, use her nasonex spray and if that doesn't  help to try xyzal at bedtime.

## 2021-02-14 ENCOUNTER — Ambulatory Visit: Payer: Medicare PPO | Admitting: Nurse Practitioner

## 2021-02-14 ENCOUNTER — Telehealth: Payer: Self-pay

## 2021-02-14 ENCOUNTER — Telehealth (INDEPENDENT_AMBULATORY_CARE_PROVIDER_SITE_OTHER): Payer: Medicare PPO | Admitting: Nurse Practitioner

## 2021-02-14 ENCOUNTER — Other Ambulatory Visit: Payer: Medicare PPO

## 2021-02-14 ENCOUNTER — Encounter: Payer: Self-pay | Admitting: Nurse Practitioner

## 2021-02-14 ENCOUNTER — Other Ambulatory Visit: Payer: Self-pay

## 2021-02-14 DIAGNOSIS — C50411 Malignant neoplasm of upper-outer quadrant of right female breast: Secondary | ICD-10-CM

## 2021-02-14 DIAGNOSIS — R059 Cough, unspecified: Secondary | ICD-10-CM | POA: Diagnosis not present

## 2021-02-14 DIAGNOSIS — U071 COVID-19: Secondary | ICD-10-CM

## 2021-02-14 DIAGNOSIS — D869 Sarcoidosis, unspecified: Secondary | ICD-10-CM | POA: Diagnosis not present

## 2021-02-14 DIAGNOSIS — Z170 Estrogen receptor positive status [ER+]: Secondary | ICD-10-CM | POA: Diagnosis not present

## 2021-02-14 DIAGNOSIS — N182 Chronic kidney disease, stage 2 (mild): Secondary | ICD-10-CM

## 2021-02-14 DIAGNOSIS — E1122 Type 2 diabetes mellitus with diabetic chronic kidney disease: Secondary | ICD-10-CM | POA: Diagnosis not present

## 2021-02-14 NOTE — Patient Instructions (Signed)

## 2021-02-14 NOTE — Progress Notes (Addendum)
Virtual Visit via Telephone   This visit type was conducted due to national recommendations for restrictions regarding the COVID-19 Pandemic (e.g. social distancing) in an effort to limit this patient's exposure and mitigate transmission in our community.  Due to her co-morbid illnesses, this patient is at least at moderate risk for complications without adequate follow up.  This format is felt to be most appropriate for this patient at this time.  All issues noted in this document were discussed and addressed.  A limited physical exam was performed with this format.    This visit type was conducted due to national recommendations for restrictions regarding the COVID-19 Pandemic (e.g. social distancing) in an effort to limit this patient's exposure and mitigate transmission in our community.  Patients identity confirmed using two different identifiers.  This format is felt to be most appropriate for this patient at this time.  All issues noted in this document were discussed and addressed.  No physical exam was performed (except for noted visual exam findings with Video Visits).    Date:  02/14/2021   ID:  Mariah Burns, DOB [birth year], MRN [id number]  Patient Location:  Home - spoke with Clemetine Marker  Provider location:   Office    Chief Complaint:  positive  History of Present Illness:    Mariah Burns is a 75 y.o. female who presents via telephone conferencing for a telehealth visit today.    The patient does have symptoms concerning for COVID-19 infection (fever, chills, cough, or new shortness of breath).   Pt presents today for testing covid positive at [facility].  She states she is going through mild symptoms, nothing extreme, more like allergies; runny nose, sneezing, coughing (hacking), and sore throat -she feels the symptoms were going on the first of September around the 6th.  Denies fever and headache. Pt told by Health Department to be quarantined until the September  18th. She took benadryl.    She is fully vaccinated for covid with 2 boosters.    Blood sugar has been 86 with her last reading.     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: failed Imuran & Prednisone therapy d/t adverse side effects TLC 106%, DLCO 75% 2009   Past Surgical History:  Procedure Laterality Date   BREAST LUMPECTOMY WITH RADIOACTIVE SEED AND SENTINEL LYMPH NODE BIOPSY Right 09/02/2018   Procedure: RIGHT BREAST LUMPECTOMY WITH RADIOACTIVE SEED AND  RIGHT SENTINEL LYMPH NODE BIOPSY;  Surgeon: Excell Seltzer, MD;  Location: [place];  Service: General;  Laterality: Right;   BRONCHOSCOPY     dx'd sarcoid   KNEE ARTHROPLASTY Left 03/13/2017   NEUROPLASTY / TRANSPOSITION ULNAR NERVE AT ELBOW  09/2011   TOTAL ABDOMINAL HYSTERECTOMY       Current Meds  Medication Sig   aspirin EC 81 MG tablet Take 81 mg by mouth daily. Swallow whole.   calcium carbonate (TUMS - DOSED IN MG ELEMENTAL CALCIUM) 500 MG chewable tablet Chew 1 tablet by mouth daily. Taking daily with Vitamin D 1000 units.   Cholecalciferol (VITAMIN D-3 PO) Take 1,000 Units by mouth daily.   Cyanocobalamin (VITAMIN B-12 PO)  famotidine (PEPCID) 40 MG tablet as needed.   felodipine (PLENDIL) 10 MG 24 hr tablet TAKE 1 TABLET BY MOUTH EVERY DAY   fish oil-omega-3 fatty acids 1000 MG capsule Take 2 capsules by mouth daily.   Fluocinolone Acetonide Scalp 0.01 % OIL Apply to affected area of scalp as directed 1-2 x/day as needed for itching   GLUCOSAMINE-CHONDROITIN PO Take 2 tablets by mouth daily.   L-Methylfolate-Algae-B12-B6 (METANX) 3-90.314-2-35 MG CAPS Take 1 capsule by mouth 2 (two) times daily.    Magnesium 250 MG TABS Take by mouth daily.    Menthol, Topical Analgesic, (BIOFREEZE ROLL-ON EX) Apply 1 application topically as needed (pain in back,neck,thumb).   mometasone (NASONEX) 50 MCG/ACT nasal spray Place 2 sprays into the nose daily. As needed   Polyvinyl Alcohol-Povidone PF 1.4-0.6 % SOLN Place 1 drop into both eyes daily as needed (dry eyes).    pravastatin (PRAVACHOL) 40 MG tablet Take 2 tablets (80 mg total) by mouth 3 (three) times a week. Mondays,wednesdays and fridays.   TART CHERRY PO    TURMERIC-GINGER PO Take 1 capsule by mouth daily.     Allergies:   Fosamax [alendronate sodium], Omeprazole, Motrin [ibuprofen], and Fluvastatin   Social History   Tobacco Use   Smoking status: Never   Smokeless tobacco: Never  Vaping Use   Vaping Use: Never used  Substance Use Topics   Alcohol use: No   Drug use: No     Family Hx: The patient's family history includes Allergies in her daughter, mother, and sister; Gout in her brother and brother; Healthy in her daughter and daughter; Heart disease in her brother, sister, and sister; Heart failure in her father; Hypertension in her brother, brother, brother, and brother; Lupus in her sister; Post-traumatic stress disorder in her brother.  ROS:   Please see the history of present illness.    Review of Systems  Constitutional:  Positive for malaise/fatigue. Negative for fever.  HENT:  Positive for sore throat. Negative for congestion and sinus pain.   Respiratory:  Positive for cough. Negative for sputum production and shortness of breath.   Cardiovascular: Negative.  Negative for chest pain.  Musculoskeletal: Negative.  Negative for myalgias.  Skin: Negative.   Neurological: Negative.  Negative for dizziness and headaches.  Endo/Heme/Allergies: Negative.   Psychiatric/Behavioral: Negative.     All other systems reviewed and are negative.   Labs/Other Tests and Data Reviewed:    Recent Labs: 08/11/2020: ALT 16; Hemoglobin 12.0; Platelet Count 199 11/02/2020: BUN 13;  Creatinine, Ser 0.83; Potassium 3.9; Sodium 140; TSH 1.170   Recent Lipid Panel Lab Results  Component Value Date/Time   CHOL 177 11/02/2020 10:36 AM   TRIG 52 11/02/2020 10:36 AM   HDL 59 11/02/2020 10:36 AM   CHOLHDL 3.0 11/02/2020 10:36 AM   LDLCALC 108 (H) 11/02/2020 10:36 AM    Wt Readings from Last 3 Encounters:  02/02/21 148 lb (67.1 kg)  11/09/20 146 lb (66.2 kg)  11/02/20 146 lb 9.6 oz (66.5 kg)     Exam:    Vital Signs:  There were no vitals taken for this visit.    Physical Exam Vitals reviewed.  Constitutional:      General: She is not in acute distress.    Appearance: Normal appearance.  Cardiovascular:     Rate and Rhythm: Normal rate.  Pulmonary:     Effort: Pulmonary effort is normal. No respiratory distress.     Breath sounds: No wheezing.  Neurological:  General: No focal deficit present.     Mental Status: She is alert and oriented to person, place, and time.     Cranial Nerves: No cranial nerve deficit.     Motor: No weakness.  Psychiatric:        Mood and Affect: Mood normal.        Behavior: Behavior normal.        Thought Content: Thought content normal.        Judgment: Judgment normal.    ASSESSMENT & PLAN:    1. COVID Will refer to infusion clinic due to her chronic health problems.  Advised patient to take Vitamin C, D, Zinc.  Keep yourself hydrated with a lot of water and rest. Take Delsym for cough and Mucinex as need. Take Tylenol or pain reliever every 4-6 hours as needed for pain/fever/body ache. If you have elevated blood pressure, you can take OTC Corcidin  Educated patient if symptoms get worse or if she experiences any SOB, chest pain or pain in her legs to seek immediate emergency care. Continue to monitor your oxygen levels. Call us if you have any questions. Quarantine until 9/18 per Health Department. Wear a mask around other people.  - bebtelovimab EUA injection SOLN 175 mg - 0.9 %  sodium chloride infusion -  Hypersensitivity GRADE 1: Transient flushing or rash, or drug fever < 100.4 F - Hypersensitivity GRADE 2: Rash, flushing, urticaria, dyspnea, or drug fever = or > 100.4 F - Hypersensitivity GRADE 3: symptomatic bronchospasm, with or without urticaria, parenteral medication management indicated, allergy-related edema/angioedema, or hypotension - Hypersensitivity GRADE 4: Anaphylaxis - diphenhydrAMINE (BENADRYL) injection 50 mg - famotidine (PEPCID) 20 mg in sodium chloride 0.9 % 50 mL IVPB - methylPREDNISolone sodium succinate (SOLU-MEDROL) 130 mg in sodium chloride 0.9 % 50 mL IVPB - albuterol (VENTOLIN HFA) 108 (90 Base) MCG/ACT inhaler 2 puff - EPINEPHrine (EPI-PEN) injection 0.3 mg  2. Cough Continue with Delsym as needed for cough  3. Type 2 diabetes mellitus with stage 2 chronic kidney disease, without long-term current use of insulin (HCC) Currently blood sugars are controlled  4. Malignant neoplasm of upper-outer quadrant of right breast in female, estrogen receptor positive (HCC) Stable  5. Sarcoidosis No current issues  COVID-19 Education: The signs and symptoms of COVID-19 were discussed with the patient and how to seek care for testing (follow up with PCP or arrange E-visit).  The importance of social distancing was discussed today.  Patient Risk:   After full review of this patients clinical status, I feel that they are at least moderate risk at this time.  Time:   Today, I have spent 16 minutes/ seconds with the patient with telehealth technology discussing above diagnoses.     Medication Adjustments/Labs and Tests Ordered: Current medicines are reviewed at length with the patient today.  Concerns regarding medicines are outlined above.   Tests Ordered: No orders of the defined types were placed in this encounter.   Medication Changes: No orders of the defined types were placed in this encounter.   Disposition:  Follow up prn  Signed, Minette Brine, FNP

## 2021-02-14 NOTE — Telephone Encounter (Signed)
Called to schedule covid infusion. No answer. Left voicemail asking patient to return call. Thanks!

## 2021-02-15 ENCOUNTER — Other Ambulatory Visit: Payer: Self-pay

## 2021-02-15 ENCOUNTER — Ambulatory Visit: Payer: Medicare PPO | Admitting: Rheumatology

## 2021-02-15 ENCOUNTER — Ambulatory Visit (INDEPENDENT_AMBULATORY_CARE_PROVIDER_SITE_OTHER): Payer: Medicare PPO

## 2021-02-15 DIAGNOSIS — Z8639 Personal history of other endocrine, nutritional and metabolic disease: Secondary | ICD-10-CM

## 2021-02-15 DIAGNOSIS — M170 Bilateral primary osteoarthritis of knee: Secondary | ICD-10-CM

## 2021-02-15 DIAGNOSIS — U071 COVID-19: Secondary | ICD-10-CM | POA: Diagnosis not present

## 2021-02-15 DIAGNOSIS — G5601 Carpal tunnel syndrome, right upper limb: Secondary | ICD-10-CM

## 2021-02-15 DIAGNOSIS — M5030 Other cervical disc degeneration, unspecified cervical region: Secondary | ICD-10-CM

## 2021-02-15 DIAGNOSIS — M19041 Primary osteoarthritis, right hand: Secondary | ICD-10-CM

## 2021-02-15 DIAGNOSIS — M8589 Other specified disorders of bone density and structure, multiple sites: Secondary | ICD-10-CM

## 2021-02-15 DIAGNOSIS — M19042 Primary osteoarthritis, left hand: Secondary | ICD-10-CM

## 2021-02-15 DIAGNOSIS — Z8669 Personal history of other diseases of the nervous system and sense organs: Secondary | ICD-10-CM

## 2021-02-15 DIAGNOSIS — Z8679 Personal history of other diseases of the circulatory system: Secondary | ICD-10-CM

## 2021-02-15 DIAGNOSIS — D860 Sarcoidosis of lung: Secondary | ICD-10-CM

## 2021-02-15 DIAGNOSIS — M25552 Pain in left hip: Secondary | ICD-10-CM

## 2021-02-15 MED ORDER — FAMOTIDINE IN NACL 20-0.9 MG/50ML-% IV SOLN
20.0000 mg | Freq: Once | INTRAVENOUS | Status: AC | PRN
Start: 2021-02-15 — End: 2021-02-15

## 2021-02-15 MED ORDER — DIPHENHYDRAMINE HCL 50 MG/ML IJ SOLN: 50.0000 mg | Freq: Once | INTRAMUSCULAR | Status: AC | PRN

## 2021-02-15 MED ORDER — SODIUM CHLORIDE 0.9 % IV SOLN: INTRAVENOUS | Status: AC | PRN

## 2021-02-15 MED ORDER — METHYLPREDNISOLONE SODIUM SUCC 125 MG IJ SOLR: 125.0000 mg | Freq: Once | INTRAMUSCULAR | Status: AC | PRN

## 2021-02-15 MED ORDER — ALBUTEROL SULFATE HFA 108 (90 BASE) MCG/ACT IN AERS: 2.0000 | INHALATION_SPRAY | Freq: Once | RESPIRATORY_TRACT | Status: AC | PRN

## 2021-02-15 MED ORDER — EPINEPHRINE 0.3 MG/0.3ML IJ SOAJ: 0.3000 mg | Freq: Once | INTRAMUSCULAR | Status: AC | PRN

## 2021-02-15 MED ORDER — BEBTELOVIMAB 175 MG/2 ML IV (EUA)
175.0000 mg | Freq: Once | INTRAMUSCULAR | Status: AC
  Administered 2021-02-15: 175 mg via INTRAVENOUS

## 2021-02-15 NOTE — Patient Instructions (Signed)
Patient was given drug information sheet for Bebtelovimab.  Also given cost estimate sheet for Bebtelovimab.  Patient reviewed documentation and questions were answered.  Patient would like to proceed with treatment at this time.      10 Things You Can Do to Manage Your COVID-19 Symptoms at Home If you have possible or confirmed COVID-19 Stay home except to get medical care. Monitor your symptoms carefully. If your symptoms get worse, call your healthcare provider immediately. Get rest and stay hydrated. If you have a medical appointment, call the healthcare provider ahead of time and tell them that you have or may have COVID-19. For medical emergencies, call 911 and notify the dispatch personnel that you have or may have COVID-19. Cover your cough and sneezes with a tissue or use the inside of your elbow. Wash your hands often with soap and water for at least 20 seconds or clean your hands with an alcohol-based hand sanitizer that contains at least 60% alcohol. As much as possible, stay in a specific room and away from other people in your home. Also, you should use a separate bathroom, if available. If you need to be around other people in or outside of the home, wear a mask. Avoid sharing personal items with other people in your household, like dishes, towels, and bedding. Clean all surfaces that are touched often, like counters, tabletops, and doorknobs. Use household cleaning sprays or wipes according to the label instructions. [email or web address] 12/18/2019 This information is not intended to replace advice given to you by your health care provider. Make sure you discuss any questions you have with your health care provider. Document Revised: 10/06/2020 Document Reviewed: 10/06/2020 Elsevier Patient Education  [place].     What types of side effects do monoclonal antibody drugs cause?  Common side effects  In general, the more common side effects caused by monoclonal  antibody drugs include: Allergic reactions, such as hives or itching Flu-like signs and symptoms, including chills, fatigue, fever, and muscle aches and pains Nausea, vomiting Diarrhea Skin rashes Low blood pressure   Currently, there are no data on the safety and efficacy of mRNA COVID-19 vaccines in persons who received monoclonal antibodies or convalescent plasma as part of COVID-19 treatment. Based on the estimated half-life of such therapies as well as evidence suggesting that reinfection is uncommon in the 90 days after initial infection, vaccination should be deferred for at least 90 days, as a precautionary measure until additional information becomes available, to avoid interference of the antibody treatment with vaccine-induced immune responses.

## 2021-02-15 NOTE — Patient Instructions (Addendum)
Visit Information It was great speaking with you today!  Please let me know if you have any questions about our visit.   Goals Addressed             This Visit's Progress    Manage My Medicine       Timeframe:  Long-Range Goal Priority:  High Start Date:                             Expected End Date:                       Follow Up Date 08/08/2021    - call for medicine refill 2 or 3 days before it runs out - call if I am sick and can't take my medicine - keep a list of all the medicines I take; vitamins and herbals too - use a pillbox to sort medicine - use an alarm clock or phone to remind me to take my medicine    Why is this important?   These steps will help you keep on track with your medicines.   Notes:  Please call with any questions.         Patient Care Plan: CCM Pharmacy Care Plan     Problem Identified: HTN, HLD, Osteoperosis/Osteopenia   Priority: High     Goal: Patient Stated   This Visit's Progress: On track  Priority: High  Note:   Current Barriers:  Unable to independently monitor therapeutic efficacy  Pharmacist Clinical Goal(s):  Patient will achieve adherence to monitoring guidelines and medication adherence to achieve therapeutic efficacy through collaboration with PharmD and provider.   Interventions: 1:1 collaboration with Mariah Chard, MD regarding development and update of comprehensive plan of care as evidenced by provider attestation and co-signature Inter-disciplinary care team collaboration (see longitudinal plan of care) Comprehensive medication review performed; medication list updated in electronic medical record  Hypertension (BP goal <140/90) -Not ideally controlled -Current treatment: Felodipine 10 mg once daily  -Medications previously tried: none noted  -Current home readings: she is checking her BP sometimes at home. Her last BP readings 01/06/2021 145/62, and pulse was 58. 8/15: 149/61 pulse: 64 -Current dietary  habits: please see cholesterol, She does not use any salt packets on her food.  -Current exercise habits: patient reports exercising 5-6 days per week. On Monday and Wednesday she and her daughter do stationary exercise, Tuesday and Thursday she goes to the pool for 30 minutes , Fridays she walks in the gym - she sometimes walks and sits for a bit and walks 5 times around the lap  -Denies hypotensive/hypertensive symptoms -Educated on Daily salt intake goal < 2300 mg; Exercise goal of 150 minutes per week; -Counseled to monitor BP at home three times per week, document, and provide log at future appointments -Recommended to continue current medication Educated on the importance of checking her BP at home   Hyperlipidemia: (LDL goal < 70) -Controlled -Current treatment: Pravastatin 80 mg tablet on Monday, Wednesday and Friday.  Aspirin 81 mg tablet once per day  Fish Oil 1000 mg capsule-take 2 capsules by mouth daily -Medications previously tried: Pravastatin 40 mg,   -Current dietary patterns: she is eating on regular basis, she usually has a boiled egg, and a piece of toast, or banana nut muffin. She usually has green tea in the morning and golden milk at night. She avoids eating past 5 pm. She  usually skips lunch because she eats breakfast so late. She eats out frequently she has been to every restaurant in [place]. Sometimes she goes TO [employer] and has meat loaf, or sometimes [employer]. She also goes to [employer] and gets [place]. Sometimes they cook at home and she makes beans, or all beef hot dogs, sometimes she has the eight piece baked chicken. She also goes to [facility] [employer] where they make the best fried chicken. She reports not eating all of these things all the time.  -Current exercise habits: patient reports exercising 5-6 days per week. On Monday and Wednesday she and her daughter do stationary exercise, Tuesday and Thursday she goes to the pool for 30 minutes , Fridays she  walks in the gym - she sometimes walks and sits for a bit and walks 5 times around the lap  -Educated on Benefits of statin for ASCVD risk reduction; Importance of limiting foods high in cholesterol; Exercise goal of 150 minutes per week; -Recommended to continue current medication  Osteoporosis / Osteopenia (Goal: reduce bone loss) -Controlled -Last DEXA Scan: -1.3   T-Score femoral neck: -1.3  T-Score lumbar spine: -0.9  10-year probability of major osteoporotic fracture: 4.5%  10-year probability of hip fracture: 0.7%  -Patient is a candidate for pharmacologic treatment due to T-Score -1.0 to -2.5 and 10-year risk of hip fracture > 3% -Current treatment  Calcium + Vitamin D (500 mg - 1000 IU) gummies once per day  -Medications previously tried: None noted  -Recommend [phone] units of vitamin D daily. Recommend 1200 mg of calcium daily from dietary and supplemental sources. Recommend weight-bearing and muscle strengthening exercises for building and maintaining bone density. -Recommended to continue current medication -Collaborate with PCP team to determine if patient is a good candidate for Prolia, due T-score and fracture risk, and intolerance to Fosamax.    Health Maintenance -Vaccine gaps: [facility] - Eat Vaccine, COVID-19 Booster Vaccine, Influenza Vaccine  -Educated on Supplements may interfere with prescription drugs -Patient is satisfied with current therapy and denies issues -Recommended to continue current medication  Patient Goals/Self-Care Activities Patient will:  - take medications as prescribed  Follow Up Plan: Telephone follow up appointment with care management team member scheduled for: The patient has been provided with contact information for the care management team and has been advised to call with any health related questions or concerns.        Mariah Burns was given information about Chronic Care Management services today including:  CCM service includes  personalized support from designated clinical staff supervised by her physician, including individualized plan of care and coordination with other care providers 24/7 contact phone numbers for assistance for urgent and routine care needs. Standard insurance, coinsurance, copays and deductibles apply for chronic care management only during months in which we provide at least 20 minutes of these services. Most insurances cover these services at 100%, however patients may be responsible for any copay, coinsurance and/or deductible if applicable. This service may help you avoid the need for more expensive face-to-face services. Only one practitioner may furnish and bill the service in a calendar month. The patient may stop CCM services at any time (effective at the end of the month) by phone call to the office staff.  Patient agreed to services and verbal consent obtained.   The patient verbalized understanding of instructions, educational materials, and care plan provided today and agreed to receive a mailed copy of patient instructions, educational materials, and care plan.   Mariah Burns  Dianah Field, PharmD Clinical Pharmacist [facility] [phone]

## 2021-02-15 NOTE — Progress Notes (Signed)
Diagnosis: COVID  Provider:  Marshell Garfinkel, MD  Procedure: Infusion  IV Type: Peripheral, IV Location: L Hand  Bebtelovimab, Dose: 175 mg  Infusion Start Time: [id number] 02/15/2021  Infusion Stop Time: [id number] 02/15/2021  Post Infusion IV Care: 30 monutes Observation period completed and Peripheral IV Discontinued  Discharge: Condition: Good, Destination: Home . AVS provided to patient.   Performed by:  Arnoldo Morale, RN

## 2021-02-22 ENCOUNTER — Other Ambulatory Visit: Payer: Self-pay

## 2021-02-22 DIAGNOSIS — L603 Nail dystrophy: Secondary | ICD-10-CM | POA: Diagnosis not present

## 2021-02-22 DIAGNOSIS — B351 Tinea unguium: Secondary | ICD-10-CM | POA: Diagnosis not present

## 2021-02-22 DIAGNOSIS — E1142 Type 2 diabetes mellitus with diabetic polyneuropathy: Secondary | ICD-10-CM | POA: Diagnosis not present

## 2021-02-22 DIAGNOSIS — Z170 Estrogen receptor positive status [ER+]: Secondary | ICD-10-CM

## 2021-02-22 DIAGNOSIS — M792 Neuralgia and neuritis, unspecified: Secondary | ICD-10-CM | POA: Diagnosis not present

## 2021-02-22 DIAGNOSIS — C50411 Malignant neoplasm of upper-outer quadrant of right female breast: Secondary | ICD-10-CM
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
## 2021-02-23 ENCOUNTER — Encounter: Payer: Self-pay | Admitting: Nurse Practitioner

## 2021-02-23 ENCOUNTER — Other Ambulatory Visit: Payer: Self-pay

## 2021-02-23 ENCOUNTER — Inpatient Hospital Stay: Payer: Medicare PPO | Attending: Nurse Practitioner

## 2021-02-23 ENCOUNTER — Inpatient Hospital Stay: Payer: Medicare PPO | Admitting: Nurse Practitioner

## 2021-02-23 ENCOUNTER — Ambulatory Visit: Payer: Medicare PPO | Admitting: Nurse Practitioner

## 2021-02-23 VITALS — BP 128/70 | HR 72 | Temp 98.8°F | Ht 59.0 in | Wt 148.8 lb

## 2021-02-23 VITALS — BP 174/68 | HR 63 | Temp 96.8°F | Resp 18 | Wt 147.4 lb

## 2021-02-23 DIAGNOSIS — G4733 Obstructive sleep apnea (adult) (pediatric): Secondary | ICD-10-CM | POA: Insufficient documentation

## 2021-02-23 DIAGNOSIS — Z7982 Long term (current) use of aspirin: Secondary | ICD-10-CM | POA: Insufficient documentation

## 2021-02-23 DIAGNOSIS — Z853 Personal history of malignant neoplasm of breast: Secondary | ICD-10-CM | POA: Diagnosis not present

## 2021-02-23 DIAGNOSIS — T148XXA Other injury of unspecified body region, initial encounter: Secondary | ICD-10-CM

## 2021-02-23 DIAGNOSIS — C50411 Malignant neoplasm of upper-outer quadrant of right female breast: Secondary | ICD-10-CM

## 2021-02-23 DIAGNOSIS — L918 Other hypertrophic disorders of the skin: Secondary | ICD-10-CM | POA: Diagnosis not present

## 2021-02-23 DIAGNOSIS — Z170 Estrogen receptor positive status [ER+]: Secondary | ICD-10-CM

## 2021-02-23 DIAGNOSIS — I10 Essential (primary) hypertension: Secondary | ICD-10-CM | POA: Diagnosis not present

## 2021-02-23 DIAGNOSIS — Z923 Personal history of irradiation: Secondary | ICD-10-CM | POA: Insufficient documentation

## 2021-02-23 DIAGNOSIS — Z7951 Long term (current) use of inhaled steroids: Secondary | ICD-10-CM | POA: Diagnosis not present

## 2021-02-23 DIAGNOSIS — Z79899 Other long term (current) drug therapy: Secondary | ICD-10-CM | POA: Diagnosis not present

## 2021-02-23 DIAGNOSIS — R0781 Pleurodynia: Secondary | ICD-10-CM

## 2021-02-23 DIAGNOSIS — R109 Unspecified abdominal pain: Secondary | ICD-10-CM

## 2021-02-23 DIAGNOSIS — M8580 Other specified disorders of bone density and structure, unspecified site: Secondary | ICD-10-CM | POA: Insufficient documentation

## 2021-02-23 LAB — CBC WITH DIFFERENTIAL/PLATELET
Abs Immature Granulocytes: 0.01 10*3/uL (ref 0.00–0.07)
Basophils Absolute: 0 10*3/uL (ref 0.0–0.1)
Basophils Relative: 1 %
Eosinophils Absolute: 0.2 10*3/uL (ref 0.0–0.5)
Eosinophils Relative: 4 %
HCT: 35.1 % — ABNORMAL LOW (ref 36.0–46.0)
Hemoglobin: 11.6 g/dL — ABNORMAL LOW (ref 12.0–15.0)
Immature Granulocytes: 0 %
Lymphocytes Relative: 27 %
Lymphs Abs: 1.1 10*3/uL (ref 0.7–4.0)
MCH: 28.9 pg (ref 26.0–34.0)
MCHC: 33 g/dL (ref 30.0–36.0)
MCV: 87.3 fL (ref 80.0–100.0)
Monocytes Absolute: 0.4 10*3/uL (ref 0.1–1.0)
Monocytes Relative: 10 %
Neutro Abs: 2.4 10*3/uL (ref 1.7–7.7)
Neutrophils Relative %: 58 %
Platelets: 194 10*3/uL (ref 150–400)
RBC: 4.02 MIL/uL (ref 3.87–5.11)
RDW: 13.7 % (ref 11.5–15.5)
WBC: 4.1 10*3/uL (ref 4.0–10.5)
nRBC: 0 % (ref 0.0–0.2)

## 2021-02-23 LAB — COMPREHENSIVE METABOLIC PANEL
ALT: 13 U/L (ref 0–44)
AST: 18 U/L (ref 15–41)
Albumin: 3.9 g/dL (ref 3.5–5.0)
Alkaline Phosphatase: 71 U/L (ref 38–126)
Anion gap: 10 (ref 5–15)
BUN: 12 mg/dL (ref 8–23)
CO2: 24 mmol/L (ref 22–32)
Calcium: 9.7 mg/dL (ref 8.9–10.3)
Chloride: 109 mmol/L (ref 98–111)
Creatinine, Ser: 0.87 mg/dL (ref 0.44–1.00)
GFR, Estimated: >60 mL/min (ref >60)
Glucose, Bld: 98 mg/dL (ref 70–99)
Potassium: 3.5 mmol/L (ref 3.5–5.1)
Sodium: 143 mmol/L (ref 135–145)
Total Bilirubin: 0.6 mg/dL (ref 0.3–1.2)
Total Protein: 7.9 g/dL (ref 6.5–8.1)

## 2021-02-23 LAB — POCT UA - MICROALBUMIN
Albumin/Creatinine Ratio, Urine, POC: <30
Creatinine, POC: 300 mg/dL
Microalbumin Ur, POC: 30 mg/L

## 2021-02-23 LAB — POCT URINALYSIS DIPSTICK
Bilirubin, UA: NEGATIVE
Blood, UA: NEGATIVE
Glucose, UA: NEGATIVE
Nitrite, UA: NEGATIVE
Protein, UA: NEGATIVE
Spec Grav, UA: 1.015 (ref 1.010–1.025)
Urobilinogen, UA: 0.2 E.U./dL
pH, UA: 6.5 (ref 5.0–8.0)
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
## 2021-02-23 NOTE — Patient Instructions (Addendum)
Flank Pain, Adult Flank pain is pain that is located on the side of the body between the upper abdomen and the spine. This area is called the flank. The pain may occur over a short period of time (acute), or it may be long-term or recurring (chronic). It may be mild or severe. Flank pain can be caused by many things, including: Muscle soreness or injury. Kidney infection, kidney stones, or kidney disease. Stress. A disease of the spine (vertebral disk disease). A lung infection (pneumonia). Fluid around the lungs (pulmonary edema). A skin rash caused by the chickenpox virus (shingles). Tumors that affect the back of the abdomen. Gallbladder disease. Follow these instructions at home:  Drink enough fluid to keep your urine pale yellow. Rest as told by your health care provider. Take over-the-counter and prescription medicines only as told by your health care provider. Keep a journal to track what has caused your flank pain and what has made it feel better. Keep all follow-up visits. This is important. Contact a health care provider if: Your pain is not controlled with medicine. You have new symptoms. Your pain gets worse. Your symptoms last longer than 2-3 days. You have trouble urinating or you are urinating very frequently. Get help right away if: You have trouble breathing or you are short of breath. Your abdomen hurts or it is swollen or red. You have nausea or vomiting. You feel faint, or you faint. You have blood in your urine. You have flank pain and a fever. These symptoms may represent a serious problem that is an emergency. Do not wait to see if the symptoms will go away. Get medical help right away. Call your local emergency services (911 in the [place] Do not drive yourself to the hospital. Summary Flank pain is pain that is located on the side of the body between the upper abdomen and the spine. The pain may occur over a short period of time (acute), or it may be  long-term or recurring (chronic). It may be mild or severe. Flank pain can be caused by many things. Contact your health care provider if your symptoms get worse or last longer than 2-3 days. This information is not intended to replace advice given to you by your health care provider. Make sure you discuss any questions you have with your health care provider. Document Revised: 08/01/2020 Document Reviewed: 08/01/2020 Elsevier Patient Education  [place] on [address] for [employer].

## 2021-02-24 ENCOUNTER — Other Ambulatory Visit: Payer: Self-pay | Admitting: Nurse Practitioner

## 2021-02-24 ENCOUNTER — Ambulatory Visit
Admission: RE | Admit: 2021-02-24 | Discharge: 2021-02-24 | Disposition: A | Discharge status: Home / Self Care | Payer: Medicare PPO | Source: Ambulatory Visit | Attending: Nurse Practitioner | Admitting: Nurse Practitioner

## 2021-02-24 DIAGNOSIS — R0781 Pleurodynia: Secondary | ICD-10-CM

## 2021-02-24 DIAGNOSIS — D869 Sarcoidosis, unspecified: Secondary | ICD-10-CM | POA: Diagnosis not present

## 2021-02-24 DIAGNOSIS — Z853 Personal history of malignant neoplasm of breast: Secondary | ICD-10-CM | POA: Diagnosis not present

## 2021-02-24 DIAGNOSIS — I517 Cardiomegaly: Secondary | ICD-10-CM | POA: Diagnosis not present

## 2021-02-24 LAB — IRON,TIBC AND FERRITIN PANEL
Ferritin: 256 ng/mL — ABNORMAL HIGH (ref 15–150)
Iron Saturation: 20 % (ref 15–55)
Iron: 47 ug/dL (ref 27–139)
Total Iron Binding Capacity: 231 ug/dL — ABNORMAL LOW (ref 250–450)
UIBC: 184 ug/dL (ref 118–369)

## 2021-02-24 LAB — VITAMIN B12: Vitamin B-12: >2000 pg/mL — ABNORMAL HIGH (ref 232–1245)
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
## 2021-02-28 ENCOUNTER — Other Ambulatory Visit: Payer: Self-pay

## 2021-02-28 ENCOUNTER — Encounter: Payer: Self-pay | Admitting: Interventional Cardiology

## 2021-02-28 ENCOUNTER — Ambulatory Visit: Payer: Medicare PPO | Admitting: Interventional Cardiology

## 2021-02-28 ENCOUNTER — Encounter: Payer: Self-pay | Admitting: Internal Medicine

## 2021-02-28 VITALS — BP 124/62 | HR 65 | Ht 59.0 in | Wt 148.2 lb

## 2021-02-28 DIAGNOSIS — I3139 Other pericardial effusion (noninflammatory): Secondary | ICD-10-CM

## 2021-02-28 DIAGNOSIS — I10 Essential (primary) hypertension: Secondary | ICD-10-CM

## 2021-02-28 DIAGNOSIS — I5032 Chronic diastolic (congestive) heart failure: Secondary | ICD-10-CM | POA: Diagnosis not present

## 2021-02-28 DIAGNOSIS — Z01419 Encounter for gynecological examination (general) (routine) without abnormal findings: Secondary | ICD-10-CM | POA: Diagnosis not present

## 2021-02-28 DIAGNOSIS — D869 Sarcoidosis, unspecified: Secondary | ICD-10-CM

## 2021-02-28 DIAGNOSIS — I4510 Unspecified right bundle-branch block: Secondary | ICD-10-CM

## 2021-02-28 DIAGNOSIS — I313 Pericardial effusion (noninflammatory): Secondary | ICD-10-CM

## 2021-02-28 DIAGNOSIS — Z6829 Body mass index (BMI) 29.0-29.9, adult: Secondary | ICD-10-CM | POA: Diagnosis not present

## 2021-02-28 DIAGNOSIS — E118 Type 2 diabetes mellitus with unspecified complications: Secondary | ICD-10-CM

## 2021-02-28 NOTE — Patient Instructions (Signed)

## 2021-03-01 ENCOUNTER — Ambulatory Visit (INDEPENDENT_AMBULATORY_CARE_PROVIDER_SITE_OTHER): Payer: Medicare PPO | Admitting: Internal Medicine

## 2021-03-01 ENCOUNTER — Encounter: Payer: Self-pay | Admitting: Internal Medicine

## 2021-03-01 VITALS — BP 122/68 | HR 65 | Temp 97.6°F | Ht 59.0 in | Wt 147.8 lb

## 2021-03-01 DIAGNOSIS — E7800 Pure hypercholesterolemia, unspecified: Secondary | ICD-10-CM

## 2021-03-01 DIAGNOSIS — N182 Chronic kidney disease, stage 2 (mild): Secondary | ICD-10-CM

## 2021-03-01 DIAGNOSIS — R413 Other amnesia: Secondary | ICD-10-CM | POA: Diagnosis not present

## 2021-03-01 DIAGNOSIS — Z23 Encounter for immunization: Secondary | ICD-10-CM

## 2021-03-01 DIAGNOSIS — I129 Hypertensive chronic kidney disease with stage 1 through stage 4 chronic kidney disease, or unspecified chronic kidney disease: Secondary | ICD-10-CM | POA: Diagnosis not present

## 2021-03-01 DIAGNOSIS — Z170 Estrogen receptor positive status [ER+]: Secondary | ICD-10-CM

## 2021-03-01 DIAGNOSIS — Z0000 Encounter for general adult medical examination without abnormal findings: Secondary | ICD-10-CM | POA: Diagnosis not present

## 2021-03-01 DIAGNOSIS — E1122 Type 2 diabetes mellitus with diabetic chronic kidney disease: Secondary | ICD-10-CM

## 2021-03-01 DIAGNOSIS — E663 Overweight: Secondary | ICD-10-CM

## 2021-03-01 DIAGNOSIS — L918 Other hypertrophic disorders of the skin: Secondary | ICD-10-CM

## 2021-03-01 DIAGNOSIS — C50411 Malignant neoplasm of upper-outer quadrant of right female breast: Secondary | ICD-10-CM | POA: Diagnosis not present

## 2021-03-01 DIAGNOSIS — C773 Secondary and unspecified malignant neoplasm of axilla and upper limb lymph nodes: Secondary | ICD-10-CM | POA: Diagnosis not present

## 2021-03-01 DIAGNOSIS — Z6829 Body mass index (BMI) 29.0-29.9, adult: Secondary | ICD-10-CM

## 2021-03-01 LAB — HEMOGLOBIN A1C
Est. average glucose Bld gHb Est-mCnc: 126 mg/dL
Hgb A1c MFr Bld: 6 % — ABNORMAL HIGH (ref 4.8–5.6)

## 2021-03-01 MED ORDER — ZOSTER VAC RECOMB ADJUVANTED 50 MCG/0.5ML IM SUSR: 0.5000 mL | Freq: Once | INTRAMUSCULAR | 1 refills | Status: AC

## 2021-03-01 NOTE — Progress Notes (Signed)
I,Tianna Badgett,acting as a Education administrator for [name], MD.,have documented all relevant documentation on the behalf of [name], MD,as directed by  [name], MD while in the presence of [name], MD.  This visit occurred during the SARS-CoV-2 public health emergency.  Safety protocols were in place, including screening questions prior to the visit, additional usage of staff PPE, and extensive cleaning of exam room while observing appropriate contact time as indicated for disinfecting solutions.  Subjective:     Patient ID: Mariah Burns , female    DOB: Dec 25, 1945 , 75 y.o.   MRN: [id number]   Chief Complaint  Patient presents with   Annual Exam   Diabetes   Hypertension    HPI  The patient is here today for physical exam.   She reports compliance with meds. She denies headaches, chest pain and shortness of breath.   Diabetes She presents for her follow-up diabetic visit. She has type 2 diabetes mellitus. Her disease course has been stable. There are no hypoglycemic associated symptoms. Pertinent negatives for diabetes include no blurred vision. There are no hypoglycemic complications. Diabetic complications include nephropathy. Risk factors for coronary artery disease include diabetes mellitus, dyslipidemia, hypertension, sedentary lifestyle and post-menopausal. She is compliant with treatment most of the time.  Hypertension This is a chronic problem. The current episode started more than 1 year ago. The problem has been gradually improving since onset. The problem is controlled. Pertinent negatives include no blurred vision.    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: failed Imuran & Prednisone therapy d/t  adverse side effects TLC 106%, DLCO 75% 2009     Family History  Problem Relation Age of Onset   Allergies Sister    Heart disease Sister    Lupus Sister    Allergies Mother    Allergies Daughter    Healthy Daughter    Heart disease Sister    Gout Brother    Hypertension Brother    Heart disease Brother    Heart failure Father    Gout Brother    Hypertension Brother    Hypertension Brother    Hypertension Brother    Post-traumatic stress disorder Brother    Healthy Daughter      Current Outpatient Medications:    aspirin EC 81 MG tablet, Take 81 mg by mouth daily. Swallow whole., Disp: , Rfl:    calcium carbonate (TUMS - DOSED IN MG ELEMENTAL CALCIUM) 500 MG chewable tablet, Chew 1 tablet by mouth daily. Taking daily with Vitamin D 1000 units., Disp: , Rfl:    Cholecalciferol (VITAMIN D-3 PO), Take 1,000 Units by mouth daily., Disp: , Rfl:    Cyanocobalamin (VITAMIN B-12 PO), , Disp: , Rfl:    famotidine (PEPCID) 40 MG tablet, as needed., Disp: , Rfl:    felodipine (PLENDIL) 10 MG 24 hr tablet, TAKE 1 TABLET BY MOUTH EVERY DAY, Disp: 90 tablet, Rfl: 2   fish oil-omega-3 fatty acids 1000 MG capsule, Take 2 capsules by mouth daily., Disp: , Rfl:    Fluocinolone Acetonide Scalp 0.01 % OIL, Apply to affected area of scalp as directed 1-2 x/day  as needed for itching, Disp: 118.28 mL, Rfl: 1   GLUCOSAMINE-CHONDROITIN PO, Take 2 tablets by mouth daily., Disp: , Rfl:    L-Methylfolate-Algae-B12-B6 (METANX) 3-90.314-2-35 MG CAPS, Take 1 capsule by mouth 2 (two) times daily. , Disp: , Rfl:    Magnesium 250 MG TABS, Take by mouth daily., Disp: , Rfl:    Menthol, Topical Analgesic, (BIOFREEZE ROLL-ON EX), Apply 1 application topically as needed (pain in back,neck,thumb)., Disp: , Rfl:    mometasone (NASONEX) 50 MCG/ACT nasal spray, Place 2 sprays into the nose daily. As needed, Disp: , Rfl:    nitroGLYCERIN (NITROSTAT) 0.4 MG SL tablet, Place 1 tablet (0.4 mg total) under the tongue every  5 (five) minutes as needed for chest pain., Disp: 25 tablet, Rfl: 2   Polyvinyl Alcohol-Povidone PF 1.4-0.6 % SOLN, Place 1 drop into both eyes daily as needed (dry eyes). , Disp: , Rfl:    pravastatin (PRAVACHOL) 40 MG tablet, Take 2 tablets (80 mg total) by mouth 3 (three) times a week. Mondays,wednesdays and fridays., Disp: 45 tablet, Rfl: 3   TART CHERRY PO, , Disp: , Rfl:    TURMERIC-GINGER PO, Take 1 capsule by mouth daily., Disp: , Rfl:   Current Facility-Administered Medications:    0.9 %  sodium chloride infusion, , Intravenous, PRN, Minette Brine, FNP   Allergies  Allergen Reactions   Fosamax [Alendronate Sodium] Other (See Comments)    REACTION: "CAUSE GI UPSET AND FATIGUE"   Omeprazole Other (See Comments)    Lower ab pain   Motrin [Ibuprofen] Other (See Comments)    GI upset- has to eat prior to taking   Fluvastatin Other (See Comments)    mild memoryproblems of confusion       The patient states she uses post menopausal status for birth control. Last LMP was No LMP recorded. Patient has had a hysterectomy.. Negative for Dysmenorrhea. Negative for: breast discharge, breast lump(s), breast pain and breast self exam. Associated symptoms include abnormal vaginal bleeding. Pertinent negatives include abnormal bleeding (hematology), anxiety, decreased libido, depression, difficulty falling sleep, dyspareunia, history of infertility, nocturia, sexual dysfunction, sleep disturbances, urinary incontinence, urinary urgency, vaginal discharge and vaginal itching. Diet regular.The patient states her exercise level is  intermittent.   . The patient's tobacco use is:  Social History   Tobacco Use  Smoking Status Never  Smokeless Tobacco Never  . She has been exposed to passive smoke. The patient's alcohol use is:  Social History   Substance and Sexual Activity  Alcohol Use No   Review of Systems  Constitutional: Negative.   HENT: Negative.    Eyes: Negative.  Negative for  blurred vision.  Respiratory: Negative.    Cardiovascular: Negative.   Gastrointestinal: Negative.   Endocrine: Negative.   Genitourinary: Negative.   Musculoskeletal: Negative.   Skin: Negative.   Allergic/Immunologic: Negative.   Neurological: Negative.        She c/o memory loss. States her sx have worsened over the past several months. Admits to forgetting to return calls. Denies having issues with finding certain locations and returning home from the store, etc. She states family members have noticed a change as well. She admits that she does not wear her CPAP regularly. She wants to know if this could affect her memory.   Hematological: Negative.   Psychiatric/Behavioral: Negative.      Today's Vitals   03/01/21 1119  BP: 122/68  Pulse: 65  Temp: 97.6 F (36.4 C)  TempSrc: Oral  Weight: 147  lb 12.8 oz (67 kg)  Height: 4\' 11"  (1.499 m)   Body mass index is 29.85 kg/m.  Wt Readings from Last 3 Encounters:  03/01/21 147 lb 12.8 oz (67 kg)  02/28/21 148 lb 3.2 oz (67.2 kg)  02/23/21 148 lb 12.8 oz (67.5 kg)    Objective:  Physical Exam Vitals and nursing note reviewed.  Constitutional:      Appearance: Normal appearance.  HENT:     Head: Normocephalic and atraumatic.     Right Ear: Tympanic membrane, ear canal and external ear normal.     Left Ear: Tympanic membrane, ear canal and external ear normal.     Nose:     Comments: Masked     Mouth/Throat:     Comments: Masked  Eyes:     Extraocular Movements: Extraocular movements intact.     Conjunctiva/sclera: Conjunctivae normal.     Pupils: Pupils are equal, round, and reactive to light.  Cardiovascular:     Rate and Rhythm: Normal rate and regular rhythm.     Pulses: Normal pulses.          Dorsalis pedis pulses are 2+ on the right side and 2+ on the left side.     Heart sounds: Normal heart sounds.  Pulmonary:     Effort: Pulmonary effort is normal.     Breath sounds: Normal breath sounds.  Chest:   Breasts:    Tanner Score is 5.     Left: Normal.     Comments:  Well healed surgical scar in the right breast  Abdominal:     General: Bowel sounds are normal.     Palpations: Abdomen is soft.  Genitourinary:    Comments: deferred Musculoskeletal:        General: Normal range of motion.     Cervical back: Normal range of motion and neck supple.  Feet:     Right foot:     Protective Sensation: 5 sites tested.  5 sites sensed.     Skin integrity: Dry skin present.     Toenail Condition: Right toenails are normal.     Left foot:     Protective Sensation: 5 sites tested.  5 sites sensed.     Skin integrity: Dry skin present.     Toenail Condition: Left toenails are normal.  Skin:    General: Skin is warm and dry.     Comments: 1cm flesh-colored skin tag located left inner thigh  Neurological:     General: No focal deficit present.     Mental Status: She is alert.  Psychiatric:        Mood and Affect: Mood normal.        Behavior: Behavior normal.        Assessment And Plan:     1. Routine general medical examination at health care facility Comments: A full exam was performed. Importance of monthly self breast exams was discussed with the patient. PATIENT IS ADVISED TO GET 30-45 MINUTES REGULAR EXERCISE NO LESS THAN FOUR TO FIVE DAYS PER WEEK - BOTH WEIGHTBEARING EXERCISES AND AEROBIC ARE RECOMMENDED.  PATIENT IS ADVISED TO FOLLOW A HEALTHY DIET WITH AT LEAST SIX FRUITS/VEGGIES PER DAY, DECREASE INTAKE OF RED MEAT, AND TO INCREASE FISH INTAKE TO TWO DAYS PER WEEK.  MEATS/FISH SHOULD NOT BE FRIED, BAKED OR BROILED IS PREFERABLE.  IT IS ALSO IMPORTANT TO CUT BACK ON YOUR SUGAR INTAKE. PLEASE AVOID ANYTHING WITH ADDED SUGAR, CORN SYRUP OR OTHER SWEETENERS. IF YOU  MUST USE A SWEETENER, YOU CAN TRY STEVIA. IT IS ALSO IMPORTANT TO AVOID ARTIFICIALLY SWEETENERS AND DIET BEVERAGES. LASTLY, I SUGGEST WEARING SPF 50 SUNSCREEN ON EXPOSED PARTS AND ESPECIALLY WHEN IN THE DIRECT SUNLIGHT FOR AN  EXTENDED PERIOD OF TIME.  PLEASE AVOID FAST FOOD RESTAURANTS AND INCREASE YOUR WATER INTAKE.   2. Type 2 diabetes mellitus with stage 2 chronic kidney disease, without long-term current use of insulin ([facility]) Comments: Diabetic foot exam was performed. I DISCUSSED WITH THE PATIENT AT LENGTH REGARDING THE GOALS OF GLYCEMIC CONTROL AND POSSIBLE LONG-TERM COMPLICATIONS.  I  ALSO STRESSED THE IMPORTANCE OF COMPLIANCE WITH HOME GLUCOSE MONITORING, DIETARY RESTRICTIONS INCLUDING AVOIDANCE OF SUGARY DRINKS/PROCESSED FOODS,  ALONG WITH REGULAR EXERCISE.  I  ALSO STRESSED THE IMPORTANCE OF ANNUAL EYE EXAMS, SELF FOOT CARE AND COMPLIANCE WITH OFFICE VISITS.  - Hemoglobin A1c  3. Hypertensive nephropathy Comments: Chronic, well controlled. She is encouraged to follow low sodium diet. She will f/u in six months for re-evaluation.   4. Memory loss Comments: I will schedule her for MRI brain. She prefers formal Neuro eval. Referral will be placed. She prefers to see Neuro prior to starting meds. Encouraged to wear CPAP nightly for at least four hours per night.  - MR Brain Wo Contrast; Future - Ambulatory referral to Neurology  5. Skin tag Procedure: Skin tag removal Informed consent:  Discussed risks (permanent scarring, infection, pain, bleeding, bruising, redness, and recurrence of the lesion) and benefits of the procedure, as well as the alternatives.  She is aware that skin tags are benign lesions, and their removal is often not considered medically necessary.  Informed consent was obtained. Anesthesia: none  The area was prepared and draped in a standard fashion. Snip removal was performed.   Antibiotic ointment and a sterile dressing were applied.   The patient tolerated procedure well. The patient was instructed on post-op care.   Number of lesions removed:  ONE  6. Secondary and unspecified malignant neoplasm of axilla and upper limb lymph nodes ([place]) Comments: As per Oncology.   7. Malignant  neoplasm of upper-outer quadrant of right breast in female, estrogen receptor positive ([place]) Diagnosed in March 2020. She is s/p right lumpectomy on 09/02/18 adjuvant radiation therapy. She did not get any adjuvant chemo was not recommended. She has not tolerated antiestrogen therapy.   8. Overweight with body mass index (BMI) of 29 to 29.9 in adult Comments: Her BMI is acceptable for her demographic. Advised to aim for at least 150 minutes of exercise per week.  9. Immunization due Comments: I will send rx Shingrix to her local pharmacy.   Patient was given opportunity to ask questions. Patient verbalized understanding of the plan and was able to repeat key elements of the plan. All questions were answered to their satisfaction.   I, [name], MD, have reviewed all documentation for this visit. The documentation on 03/01/21 for the exam, diagnosis, procedures, and orders are all accurate and complete.  THE PATIENT IS ENCOURAGED TO PRACTICE SOCIAL DISTANCING DUE TO THE COVID-19 PANDEMIC.
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
## 2021-03-01 NOTE — Patient Instructions (Signed)
Health Maintenance, Female Adopting a healthy lifestyle and getting preventive care are important in promoting health and wellness. Ask your health care provider about: The right schedule for you to have regular tests and exams. Things you can do on your own to prevent diseases and keep yourself healthy. What should I know about diet, weight, and exercise? Eat a healthy diet  Eat a diet that includes plenty of vegetables, fruits, low-fat dairy products, and lean protein. Do not eat a lot of foods that are high in solid fats, added sugars, or sodium. Maintain a healthy weight Body mass index (BMI) is used to identify weight problems. It estimates body fat based on height and weight. Your health care provider can help determine your BMI and help you achieve or maintain a healthy weight. Get regular exercise Get regular exercise. This is one of the most important things you can do for your health. Most adults should: Exercise for at least 150 minutes each week. The exercise should increase your heart rate and make you sweat (moderate-intensity exercise). Do strengthening exercises at least twice a week. This is in addition to the moderate-intensity exercise. Spend less time sitting. Even light physical activity can be beneficial. Watch cholesterol and blood lipids Have your blood tested for lipids and cholesterol at 76 years of age, then have this test every 5 years. Have your cholesterol levels checked more often if: Your lipid or cholesterol levels are high. You are older than 76 years of age. You are at high risk for heart disease. What should I know about cancer screening? Depending on your health history and family history, you may need to have cancer screening at various ages. This may include screening for: Breast cancer. Cervical cancer. Colorectal cancer. Skin cancer. Lung cancer. What should I know about heart disease, diabetes, and high blood pressure? Blood pressure and heart  disease High blood pressure causes heart disease and increases the risk of stroke. This is more likely to develop in people who have high blood pressure readings, are of African descent, or are overweight. Have your blood pressure checked: Every 3-5 years if you are 18-39 years of age. Every year if you are 40 years old or older. Diabetes Have regular diabetes screenings. This checks your fasting blood sugar level. Have the screening done: Once every three years after age 40 if you are at a normal weight and have a low risk for diabetes. More often and at a younger age if you are overweight or have a high risk for diabetes. What should I know about preventing infection? Hepatitis B If you have a higher risk for hepatitis B, you should be screened for this virus. Talk with your health care provider to find out if you are at risk for hepatitis B infection. Hepatitis C Testing is recommended for: Everyone born from 1945 through 1965. Anyone with known risk factors for hepatitis C. Sexually transmitted infections (STIs) Get screened for STIs, including gonorrhea and chlamydia, if: You are sexually active and are younger than 76 years of age. You are older than 76 years of age and your health care provider tells you that you are at risk for this type of infection. Your sexual activity has changed since you were last screened, and you are at increased risk for chlamydia or gonorrhea. Ask your health care provider if you are at risk. Ask your health care provider about whether you are at high risk for HIV. Your health care provider may recommend a prescription medicine   to help prevent HIV infection. If you choose to take medicine to prevent HIV, you should first get tested for HIV. You should then be tested every 3 months for as long as you are taking the medicine. Pregnancy If you are about to stop having your period (premenopausal) and you may become pregnant, seek counseling before you get  pregnant. Take 400 to 800 micrograms (mcg) of folic acid every day if you become pregnant. Ask for birth control (contraception) if you want to prevent pregnancy. Osteoporosis and menopause Osteoporosis is a disease in which the bones lose minerals and strength with aging. This can result in bone fractures. If you are 65 years old or older, or if you are at risk for osteoporosis and fractures, ask your health care provider if you should: Be screened for bone loss. Take a calcium or vitamin D supplement to lower your risk of fractures. Be given hormone replacement therapy (HRT) to treat symptoms of menopause. Follow these instructions at home: Lifestyle Do not use any products that contain nicotine or tobacco, such as cigarettes, e-cigarettes, and chewing tobacco. If you need help quitting, ask your health care provider. Do not use street drugs. Do not share needles. Ask your health care provider for help if you need support or information about quitting drugs. Alcohol use Do not drink alcohol if: Your health care provider tells you not to drink. You are pregnant, may be pregnant, or are planning to become pregnant. If you drink alcohol: Limit how much you use to 0-1 drink a day. Limit intake if you are breastfeeding. Be aware of how much alcohol is in your drink. In the [place] one drink equals one 12 oz bottle of beer (355 mL), one 5 oz glass of wine (148 mL), or one 1 oz glass of hard liquor (44 mL). General instructions Schedule regular health, dental, and eye exams. Stay current with your vaccines. Tell your health care provider if: You often feel depressed. You have ever been abused or do not feel safe at home. Summary Adopting a healthy lifestyle and getting preventive care are important in promoting health and wellness. Follow your health care provider's instructions about healthy diet, exercising, and getting tested or screened for diseases. Follow your health care provider's  instructions on monitoring your cholesterol and blood pressure. This information is not intended to replace advice given to you by your health care provider. Make sure you discuss any questions you have with your health care provider. Document Revised: 07/29/2020 Document Reviewed: 05/14/2018 Elsevier Patient Education  2022 Elsevier Inc.  

## 2021-03-02 ENCOUNTER — Ambulatory Visit: Payer: Medicare PPO | Admitting: Nurse Practitioner

## 2021-03-03 ENCOUNTER — Telehealth: Payer: Self-pay

## 2021-03-03 ENCOUNTER — Telehealth: Payer: Medicare PPO

## 2021-03-03 DIAGNOSIS — E7800 Pure hypercholesterolemia, unspecified: Secondary | ICD-10-CM | POA: Diagnosis not present

## 2021-03-03 DIAGNOSIS — I129 Hypertensive chronic kidney disease with stage 1 through stage 4 chronic kidney disease, or unspecified chronic kidney disease: Secondary | ICD-10-CM | POA: Diagnosis not present

## 2021-03-03 NOTE — Telephone Encounter (Signed)
  Care Management   Follow Up Note   03/03/2021 Name: Mariah Burns MRN: [id number] DOB: February 04, 1946   Referred by: Glendale Chard, MD Reason for referral : Chronic Care Management (Initial RN CM Outreach - 2nd attempt )   An unsuccessful telephone outreach was attempted today. The patient was referred to the case management team for assistance with care management and care coordination.   Follow Up Plan: Telephone follow up appointment with care management team member scheduled for: 04/04/21  Barb Merino, RN, BSN, CCM Care Management Coordinator [facility]  Direct Phone: [phone]

## 2021-03-06 DIAGNOSIS — E663 Overweight: Secondary | ICD-10-CM | POA: Diagnosis not present

## 2021-03-06 DIAGNOSIS — G603 Idiopathic progressive neuropathy: Secondary | ICD-10-CM | POA: Diagnosis not present

## 2021-03-06 DIAGNOSIS — I2510 Atherosclerotic heart disease of native coronary artery without angina pectoris: Secondary | ICD-10-CM | POA: Diagnosis not present

## 2021-03-06 DIAGNOSIS — I10 Essential (primary) hypertension: Secondary | ICD-10-CM | POA: Diagnosis not present

## 2021-03-06 DIAGNOSIS — I739 Peripheral vascular disease, unspecified: Secondary | ICD-10-CM | POA: Diagnosis not present

## 2021-03-06 DIAGNOSIS — D860 Sarcoidosis of lung: Secondary | ICD-10-CM | POA: Diagnosis not present

## 2021-03-06 DIAGNOSIS — G4733 Obstructive sleep apnea (adult) (pediatric): Secondary | ICD-10-CM | POA: Diagnosis not present

## 2021-03-06 DIAGNOSIS — E785 Hyperlipidemia, unspecified: Secondary | ICD-10-CM | POA: Diagnosis not present

## 2021-03-06 DIAGNOSIS — G8929 Other chronic pain: Secondary | ICD-10-CM | POA: Diagnosis not present

## 2021-03-07 ENCOUNTER — Ambulatory Visit (INDEPENDENT_AMBULATORY_CARE_PROVIDER_SITE_OTHER): Payer: Medicare PPO

## 2021-03-07 DIAGNOSIS — N182 Chronic kidney disease, stage 2 (mild): Secondary | ICD-10-CM

## 2021-03-07 DIAGNOSIS — E1122 Type 2 diabetes mellitus with diabetic chronic kidney disease: Secondary | ICD-10-CM

## 2021-03-07 DIAGNOSIS — I129 Hypertensive chronic kidney disease with stage 1 through stage 4 chronic kidney disease, or unspecified chronic kidney disease: Secondary | ICD-10-CM

## 2021-03-07 DIAGNOSIS — E7800 Pure hypercholesterolemia, unspecified: Secondary | ICD-10-CM | POA: Insufficient documentation

## 2021-03-07 DIAGNOSIS — R413 Other amnesia: Secondary | ICD-10-CM

## 2021-03-07 HISTORY — DX: Pure hypercholesterolemia, unspecified: E78.00

## 2021-03-07 NOTE — Patient Instructions (Signed)
Social Worker Visit Information  Goals we discussed today:   Goals Addressed             This Visit's Progress    Cognitive Function Enhanced       Timeframe:  Long-Range Goal Priority:  High Start Date:  10.4.22                            Next planned outreach: 10.25.22  Patient Goals/Self-Care Activities patient will:   - Engage with Neurology team -Contact SW as needed prior to next scheduled call                           Obtain a better understanding of health plan benefits       Timeframe:  Long-Range Goal Priority:  Low Start Date:   8.2.22                                       Next planned outreach: 12.14.22  Patient Goals/Self-Care Activities patient will:   - Engage with RN Care Manager -Contact SW as needed prior to next scheduled call         Materials Provided: Verbal education about health plan benefits provided by phone  The patient verbalized understanding of instructions, educational materials, and care plan provided today and declined offer to receive copy of patient instructions, educational materials, and care plan.   Follow Up Plan: SW will follow up with patient by phone over the next month   Daneen Schick, BSW, CDP Social Worker, Certified Dementia Practitioner [place] [facility] [phone]

## 2021-03-07 NOTE — Chronic Care Management (AMB) (Signed)
Chronic Care Management    Social Work Note  03/07/2021 Name: Mariah Burns MRN: [id number] DOB: [birth year]  SEEMA BLUM is a 75 y.o. year old female who is a primary care patient of Glendale Chard, MD. The CCM team was consulted to assist the patient with chronic disease management and/or care coordination needs related to:  DM II and Hypertensive Nephropathy .   Engaged with patient by telephone for follow up visit in response to provider referral for social work chronic care management and care coordination services.   Consent to Services:  The patient was given information about Chronic Care Management services, agreed to services, and gave verbal consent prior to initiation of services.  Please see initial visit note for detailed documentation.   Patient agreed to services and consent obtained.   Assessment: Review of patient past medical history, allergies, medications, and health status, including review of relevant consultants reports was performed today as part of a comprehensive evaluation and provision of chronic care management and care coordination services.     SDOH (Social Determinants of Health) assessments and interventions performed:    Advanced Directives Status: Not addressed in this encounter.  CCM Care Plan  Allergies  Allergen Reactions   Fosamax [Alendronate Sodium] Other (See Comments)    REACTION: "CAUSE GI UPSET AND FATIGUE"   Omeprazole Other (See Comments)    Lower ab pain   Motrin [Ibuprofen] Other (See Comments)    GI upset- has to eat prior to taking   Fluvastatin Other (See Comments)    mild memoryproblems of confusion     Outpatient Encounter Medications as of 03/07/2021  Medication Sig   aspirin EC 81 MG tablet Take 81 mg by mouth daily. Swallow whole.   calcium carbonate (TUMS - DOSED IN MG ELEMENTAL CALCIUM) 500 MG chewable tablet Chew 1 tablet by mouth daily. Taking daily with Vitamin D 1000 units.   Cholecalciferol (VITAMIN D-3  PO) Take 1,000 Units by mouth daily.   Cyanocobalamin (VITAMIN B-12 PO)    famotidine (PEPCID) 40 MG tablet as needed.   felodipine (PLENDIL) 10 MG 24 hr tablet TAKE 1 TABLET BY MOUTH EVERY DAY   fish oil-omega-3 fatty acids 1000 MG capsule Take 2 capsules by mouth daily.   Fluocinolone Acetonide Scalp 0.01 % OIL Apply to affected area of scalp as directed 1-2 x/day as needed for itching   GLUCOSAMINE-CHONDROITIN PO Take 2 tablets by mouth daily.   L-Methylfolate-Algae-B12-B6 (METANX) 3-90.314-2-35 MG CAPS Take 1 capsule by mouth 2 (two) times daily.    Magnesium 250 MG TABS Take by mouth daily.   Menthol, Topical Analgesic, (BIOFREEZE ROLL-ON EX) Apply 1 application topically as needed (pain in back,neck,thumb).   mometasone (NASONEX) 50 MCG/ACT nasal spray Place 2 sprays into the nose daily. As needed   nitroGLYCERIN (NITROSTAT) 0.4 MG SL tablet Place 1 tablet (0.4 mg total) under the tongue every 5 (five) minutes as needed for chest pain.   Polyvinyl Alcohol-Povidone PF 1.4-0.6 % SOLN Place 1 drop into both eyes daily as needed (dry eyes).    pravastatin (PRAVACHOL) 40 MG tablet Take 2 tablets (80 mg total) by mouth 3 (three) times a week. Mondays,wednesdays and fridays.   TART CHERRY PO    TURMERIC-GINGER PO Take 1 capsule by mouth daily.   Facility-Administered Encounter Medications as of 03/07/2021  Medication   0.9 %  sodium chloride infusion    Patient Active Problem List   Diagnosis Date Noted   Reactive depression 11/19/2020  Dry mouth 11/19/2020   Memory loss 11/19/2020   Secondary and unspecified malignant neoplasm of axilla and upper limb lymph nodes ([place]) 10/28/2019   Class 1 obesity due to excess calories with serious comorbidity and body mass index (BMI) of 30.0 to 30.9 in adult 10/28/2019   Vitamin D deficiency disease 10/28/2019   Chronic bilateral low back pain without sciatica 10/28/2019   Hip pain, bilateral 10/28/2019   Malignant neoplasm of upper-outer quadrant  of right breast in female, estrogen receptor positive ([place]) 08/11/2018   Chronic renal disease, stage II 05/15/2018   Hypertensive nephropathy 05/15/2018   Adult BMI 29.0-29.9 kg/sq m 05/15/2018   Dyspnea on exertion 02/17/2015   Incomplete right bundle branch block 79/89/2119   Systolic murmur 41/74/0814   Pericardial effusion 02/17/2015   Osteopenia 10/05/2011   Type 2 diabetes mellitus with stage 2 chronic kidney disease, without long-term current use of insulin ([place]) 05/06/2010   SARCOIDOSIS 03/21/2007   HYPERLIPIDEMIA 03/21/2007   Obstructive sleep apnea 03/21/2007   Essential hypertension 03/21/2007    Conditions to be addressed/monitored: DMII and Hypertensive Nephropathy ; Memory Deficits  Care Plan : Social Work [place]  Updates made by Daneen Schick since 03/07/2021 12:00 AM     Problem: Quality of Life (General Plan of Care)      Long-Range Goal: Obtain a Better Understanding of Health Plan Benefits   Start Date: 01/03/2021  Recent Progress: On track  Priority: Low  Note:   Current Barriers:  Chronic disease management support and education needs related to DM and Hypertensive Nephropathy   Limited knowledge of health plan benefit  Social Worker Clinical Goal(s):  patient will work with SW to identify and address any acute and/or chronic care coordination needs related to the self health management of DM and Hypertensive Nephropathy   Patient will work with SW to become more knowledgeable of health plan benefits  SW Interventions:  Inter-disciplinary care team collaboration (see longitudinal plan of care) Collaboration with Glendale Chard, MD regarding development and update of comprehensive plan of care as evidenced by provider attestation and co-signature Successful outbound call placed to the patient to assess goal progression Confirmed patient did receive gift cards from [phone] program but has yet to utilize them Patient indicates she does not recall  ordering these cards or what they are for SW provided re-education of GO365 benefit offered under her health plan Patient requests SW follow up call to remind her how to use this benefit (see separate care plan entry regarding concerns with cognition) Discussed plans for SW contact the patient over the 75 days to re-educate on health plan benefits  Patient Goals/Self-Care Activities patient will:   -  Engage with RN Care Manager -Contact SW as needed prior to next scheduled call  Follow Up Plan:  SW will follow up with the patient over the next 90 days     Problem: Cognitive Function      Long-Range Goal: Cognitive Function Enhanced   Start Date: 03/07/2021  Priority: High  Note:   Current Barriers:  Chronic disease management support and education needs related to DM and Hypertensive Nephropathy   Cognitive Deficits  Social Worker Clinical Goal(s):  patient will work with SW to identify and address any acute and/or chronic care coordination needs related to the self health management of DM and Hypertensive Nephropathy   Patient will engage with neurologist to address concerns with memory loss  SW Interventions:  Inter-disciplinary care team collaboration (see longitudinal plan of  care) Collaboration with Glendale Chard, MD regarding development and update of comprehensive plan of care as evidenced by provider attestation and co-signature Successful outbound call placed to the patient to assess care coordination needs Patient discussed she is becoming more and more forgetful stating "I have a hard time remembering to call or text my friends back" Assessed for patient ability to manage household tasks - patient admits she is forgetting to pya bills on time Discussed the patients disabled daughter lives in the home with her and does offer verbal reminders to the patient regarding bill payments Performed chart review to note patient seen in clinic on June 1 but declined medication to  assist with memory enhancement Noted recent referral placed to Neurology by the patients primary care provider on 9.28  Advised the patient [place] has attempted to contact her twice without success to assist with disease management needs - patient does not recall these missed appointments Provided next planned outreach date with RN for patient knowledge - patient stated "I will try to remember" Discussed plan for SW to follow up with the patient over the next month to confirm appointment with Neurology has been scheduled Collaboration with Dr. Baird Cancer and RN Care Manager to inform of SW concern with patients executive function ability and plan for SW to follow up in the next month  Patient Goals/Self-Care Activities patient will:   -  Engage with Neurology team -Contact SW as needed prior to next scheduled call   Follow Up Plan:  SW will follow up with the patient over the next month       Follow Up Plan: SW will follow up with patient by phone over the next month      Daneen Schick, BSW, CDP Social Worker, Certified Dementia Practitioner [place] [facility] [place] [facility] [phone]

## 2021-03-09 ENCOUNTER — Telehealth: Payer: Self-pay | Admitting: *Deleted

## 2021-03-09 NOTE — Telephone Encounter (Signed)
Patient called. The bone density scan ordered on 02/23/21 is scheduled for 08/14/2021 11:30 AM at [facility].   Patient wants to know if she should have it done sooner. She said they told her that was the first opening they had [name], NP informed. Per Mariah Burns, informed patient that appt date was ok as test was not urgent. Patient verbalized understanding

## 2021-03-14 ENCOUNTER — Other Ambulatory Visit: Payer: Self-pay | Admitting: Internal Medicine

## 2021-03-15 ENCOUNTER — Ambulatory Visit: Payer: Medicare PPO | Admitting: Rheumatology

## 2021-03-15 ENCOUNTER — Other Ambulatory Visit: Payer: Self-pay

## 2021-03-15 ENCOUNTER — Encounter: Payer: Self-pay | Admitting: Rheumatology

## 2021-03-15 VITALS — BP 158/75 | HR 59 | Ht 59.0 in | Wt 147.2 lb

## 2021-03-15 DIAGNOSIS — M8589 Other specified disorders of bone density and structure, multiple sites: Secondary | ICD-10-CM

## 2021-03-15 DIAGNOSIS — M5030 Other cervical disc degeneration, unspecified cervical region: Secondary | ICD-10-CM

## 2021-03-15 DIAGNOSIS — Z8639 Personal history of other endocrine, nutritional and metabolic disease: Secondary | ICD-10-CM | POA: Diagnosis not present

## 2021-03-15 DIAGNOSIS — G5601 Carpal tunnel syndrome, right upper limb: Secondary | ICD-10-CM

## 2021-03-15 DIAGNOSIS — M19041 Primary osteoarthritis, right hand: Secondary | ICD-10-CM

## 2021-03-15 DIAGNOSIS — Z8669 Personal history of other diseases of the nervous system and sense organs: Secondary | ICD-10-CM

## 2021-03-15 DIAGNOSIS — D860 Sarcoidosis of lung: Secondary | ICD-10-CM

## 2021-03-15 DIAGNOSIS — Z8679 Personal history of other diseases of the circulatory system: Secondary | ICD-10-CM | POA: Diagnosis not present

## 2021-03-15 DIAGNOSIS — M170 Bilateral primary osteoarthritis of knee: Secondary | ICD-10-CM

## 2021-03-15 DIAGNOSIS — M19042 Primary osteoarthritis, left hand: Secondary | ICD-10-CM

## 2021-03-15 DIAGNOSIS — M25552 Pain in left hip: Secondary | ICD-10-CM

## 2021-03-17 ENCOUNTER — Other Ambulatory Visit: Payer: Self-pay | Admitting: Internal Medicine

## 2021-03-17 MED ORDER — DIAZEPAM 2 MG PO TABS: ORAL_TABLET | ORAL | 0 refills | Status: DC

## 2021-03-20 ENCOUNTER — Other Ambulatory Visit: Payer: Self-pay | Admitting: Internal Medicine

## 2021-03-21 ENCOUNTER — Ambulatory Visit
Admission: RE | Admit: 2021-03-21 | Discharge: 2021-03-21 | Disposition: A | Discharge status: Home / Self Care | Payer: Medicare PPO | Source: Ambulatory Visit | Attending: Internal Medicine | Admitting: Internal Medicine

## 2021-03-21 ENCOUNTER — Other Ambulatory Visit: Payer: Self-pay

## 2021-03-21 DIAGNOSIS — R413 Other amnesia: Secondary | ICD-10-CM | POA: Diagnosis not present

## 2021-03-21 DIAGNOSIS — I6782 Cerebral ischemia: Secondary | ICD-10-CM | POA: Diagnosis not present

## 2021-03-21 DIAGNOSIS — G319 Degenerative disease of nervous system, unspecified: Secondary | ICD-10-CM | POA: Diagnosis not present

## 2021-03-28 ENCOUNTER — Ambulatory Visit: Payer: Medicare PPO

## 2021-03-28 ENCOUNTER — Encounter: Payer: Self-pay | Admitting: Internal Medicine

## 2021-03-28 DIAGNOSIS — E1122 Type 2 diabetes mellitus with diabetic chronic kidney disease: Secondary | ICD-10-CM

## 2021-03-28 DIAGNOSIS — I129 Hypertensive chronic kidney disease with stage 1 through stage 4 chronic kidney disease, or unspecified chronic kidney disease: Secondary | ICD-10-CM

## 2021-03-28 DIAGNOSIS — N182 Chronic kidney disease, stage 2 (mild): Secondary | ICD-10-CM

## 2021-03-28 DIAGNOSIS — R413 Other amnesia: Secondary | ICD-10-CM

## 2021-03-28 NOTE — Patient Instructions (Signed)
Social Worker Visit Information  Goals we discussed today:   Goals Addressed             This Visit's Progress    Cognitive Function Enhanced   On track    Timeframe:  Long-Range Goal Priority:  High Start Date:  10.4.22                            Next planned outreach: 12.14.22  Patient Goals/Self-Care Activities patient will:   - Engage with Neurology team -Contact SW as needed prior to next scheduled call                               The patient verbalized understanding of instructions, educational materials, and care plan provided today and declined offer to receive copy of patient instructions, educational materials, and care plan.   Follow Up Plan: SW will follow up with patient by phone over the next 60 days   Daneen Schick, BSW, CDP Social Worker, Certified Dementia Practitioner [place] [facility] [place] [facility] [phone]

## 2021-03-28 NOTE — Chronic Care Management (AMB) (Signed)
Chronic Care Management    Social Work Note  03/28/2021 Name: Mariah Burns MRN: [id number] DOB: 10/15/1945  Mariah Burns is a 75 y.o. year old female who is a primary care patient of Glendale Chard, MD. The CCM team was consulted to assist the patient with chronic disease management and/or care coordination needs related to:  DM II, Hypertensive Nephropathy .   Engaged with patient by telephone for follow up visit in response to provider referral for social work chronic care management and care coordination services.   Consent to Services:  The patient was given information about Chronic Care Management services, agreed to services, and gave verbal consent prior to initiation of services.  Please see initial visit note for detailed documentation.   Patient agreed to services and consent obtained.   Assessment: Review of patient past medical history, allergies, medications, and health status, including review of relevant consultants reports was performed today as part of a comprehensive evaluation and provision of chronic care management and care coordination services.     SDOH (Social Determinants of Health) assessments and interventions performed:    Advanced Directives Status: Not addressed in this encounter.  CCM Care Plan  Allergies  Allergen Reactions   Fosamax [Alendronate Sodium] Other (See Comments)    REACTION: "CAUSE GI UPSET AND FATIGUE"   Omeprazole Other (See Comments)    Lower ab pain   Motrin [Ibuprofen] Other (See Comments)    GI upset- has to eat prior to taking   Fluvastatin Other (See Comments)    mild memoryproblems of confusion     Outpatient Encounter Medications as of 03/28/2021  Medication Sig   aspirin EC 81 MG tablet Take 81 mg by mouth daily. Swallow whole.   calcium carbonate (TUMS - DOSED IN MG ELEMENTAL CALCIUM) 500 MG chewable tablet Chew 1 tablet by mouth as needed.   CALCIUM PO Take by mouth daily.   Cholecalciferol (VITAMIN D-3 PO)  Take 1,000 Units by mouth daily.   Cyanocobalamin (VITAMIN B-12 PO)    diazepam (VALIUM) 2 MG tablet One tab 1 hour prior to procedure, repeat if needed   famotidine (PEPCID) 40 MG tablet as needed.   felodipine (PLENDIL) 10 MG 24 hr tablet TAKE 1 TABLET BY MOUTH EVERY DAY   fish oil-omega-3 fatty acids 1000 MG capsule Take 2 capsules by mouth daily.   Fluocinolone Acetonide Scalp 0.01 % OIL Apply to affected area of scalp as directed 1-2 x/day as needed for itching   GLUCOSAMINE-CHONDROITIN PO Take 2 tablets by mouth daily.   L-Methylfolate-Algae-B12-B6 (METANX) 3-90.314-2-35 MG CAPS Take 1 capsule by mouth 2 (two) times daily.    Magnesium 250 MG TABS Take by mouth daily.   Menthol, Topical Analgesic, (BIOFREEZE ROLL-ON EX) Apply 1 application topically as needed (pain in back,neck,thumb).   mometasone (NASONEX) 50 MCG/ACT nasal spray Place 2 sprays into the nose daily. As needed   nitroGLYCERIN (NITROSTAT) 0.4 MG SL tablet Place 1 tablet (0.4 mg total) under the tongue every 5 (five) minutes as needed for chest pain.   Polyvinyl Alcohol-Povidone PF 1.4-0.6 % SOLN Place 1 drop into both eyes daily as needed (dry eyes).    pravastatin (PRAVACHOL) 40 MG tablet Take 2 tablets (80 mg total) by mouth 3 (three) times a week. Mondays,wednesdays and fridays.   TART CHERRY PO    TURMERIC-GINGER PO Take 1 capsule by mouth daily.   Facility-Administered Encounter Medications as of 03/28/2021  Medication   0.9 %  sodium chloride infusion  Patient Active Problem List   Diagnosis Date Noted   Pure hypercholesterolemia 03/07/2021   Reactive depression 11/19/2020   Dry mouth 11/19/2020   Memory loss 11/19/2020   Secondary and unspecified malignant neoplasm of axilla and upper limb lymph nodes ([place]) 10/28/2019   Class 1 obesity due to excess calories with serious comorbidity and body mass index (BMI) of 30.0 to 30.9 in adult 10/28/2019   Vitamin D deficiency disease 10/28/2019   Chronic bilateral  low back pain without sciatica 10/28/2019   Hip pain, bilateral 10/28/2019   Malignant neoplasm of upper-outer quadrant of right breast in female, estrogen receptor positive ([place]) 08/11/2018   Chronic renal disease, stage II 05/15/2018   Hypertensive nephropathy 05/15/2018   Adult BMI 29.0-29.9 kg/sq m 05/15/2018   Dyspnea on exertion 02/17/2015   Incomplete right bundle branch block 21/97/5883   Systolic murmur 25/49/8264   Pericardial effusion 02/17/2015   Osteopenia 10/05/2011   Type 2 diabetes mellitus with stage 2 chronic kidney disease, without long-term current use of insulin ([place]) 05/06/2010   SARCOIDOSIS 03/21/2007   HYPERLIPIDEMIA 03/21/2007   Obstructive sleep apnea 03/21/2007   Essential hypertension 03/21/2007    Conditions to be addressed/monitored:  DM II, Hypertensive Nephropathy ; Memory Deficits  Care Plan : Social Work [place]  Updates made by Daneen Schick since 03/28/2021 12:00 AM     Problem: Cognitive Function      Long-Range Goal: Cognitive Function Enhanced   Start Date: 03/07/2021  This Visit's Progress: On track  Priority: High  Note:   Current Barriers:  Chronic disease management support and education needs related to DM and Hypertensive Nephropathy   Cognitive Deficits  Social Worker Clinical Goal(s):  patient will work with SW to identify and address any acute and/or chronic care coordination needs related to the self health management of DM and Hypertensive Nephropathy   Patient will engage with neurologist to address concerns with memory loss  SW Interventions:  03/28/21 Inter-disciplinary care team collaboration (see longitudinal plan of care) Collaboration with Glendale Chard, MD regarding development and update of comprehensive plan of care as evidenced by provider attestation and co-signature Successful outbound call placed to the patient to assess goal progression Confirmed patient has an appointment on 12/20 at 10:30 with Dr.  Leta Baptist  Discussed patient has this appointment written on her calendar so she will not miss it 03/07/21 Inter-disciplinary care team collaboration (see longitudinal plan of care) Collaboration with Glendale Chard, MD regarding development and update of comprehensive plan of care as evidenced by provider attestation and co-signature Successful outbound call placed to the patient to assess care coordination needs Patient discussed she is becoming more and more forgetful stating "I have a hard time remembering to call or text my friends back" Assessed for patient ability to manage household tasks - patient admits she is forgetting to pya bills on time Discussed the patients disabled daughter lives in the home with her and does offer verbal reminders to the patient regarding bill payments Performed chart review to note patient seen in clinic on June 1 but declined medication to assist with memory enhancement Noted recent referral placed to Neurology by the patients primary care provider on 9.28  Advised the patient RN Care Manager has attempted to contact her twice without success to assist with disease management needs - patient does not recall these missed appointments Provided next planned outreach date with RN for patient knowledge - patient stated "I will try to remember" Discussed plan for SW  to follow up with the patient over the next month to confirm appointment with Neurology has been scheduled Collaboration with Dr. Baird Cancer and [place] to inform of SW concern with patients executive function ability and plan for SW to follow up in the next month  Patient Goals/Self-Care Activities patient will:   -  Engage with Neurology team -Contact SW as needed prior to next scheduled call   Follow Up Plan:  SW will follow up with the patient over the next 60 days       Follow Up Plan: SW will follow up with patient by phone over the next 60 days      Daneen Schick, BSW, CDP Social  Worker, Certified Dementia Practitioner [place] [facility] [place] [facility] [phone]

## 2021-03-29 NOTE — Progress Notes (Signed)
Subjective:    Patient ID: Mariah Burns, female    DOB: 16-Jun-1945, 75 y.o.   MRN: [id number]  HPI female never smoker followed for OSA, Sarcoid, complicated by HBP, DM NPSG 03/08/2010  AHI 19.8/hr  Office spirometry 09/20/15- WNL-FEV1/FVC 0.85 HST 12/24/19- AHI 12.1/ hr, desaturation to 88%, bodyweight 150 lbs ------------------------------------------------------------------------------------------ 03/28/20- 75 year old female never smoker followed for OSA, Sarcoid, complicated by HBP, DM, Cancer R breast XRT, CPAP auto 4-10/ [facility]- compliance 97%, AHI 1.2/ hr Body weight today-147 HST 12/24/19- AHI 12.1/ hr, desaturation to 88%, bodyweight 150 lbs CPAP 4-10/ [facility] Download compliance 97%, AHI 1.2/ hr Covid vax- 2 Phizer Flu vax- had CXR 11/26/19- IMPRESSION: 1. Stable scarring compatible with history of sarcoidosis. No acute process.  03/30/21- 75 year old female never smoker followed for OSA, Sarcoid, complicated by HBP, DM2, Cancer R breast XRT, CPAP auto 4-10/ [facility]-compliance 90%, AHI 1.6/ hr Body weight today-149 lbs Covid vax- 3 Phizer Flu vax-today -----Patient is not sleeping all night wakes up about 3 or 4. Wearing CPAP machine.  Doing well with CPAP- download reviewed. Waking for bathroom 3-4x/ night, then difficult getting back to sleep. Sleeps with TV on, sound down. May play games on phone. Discussed screen exposure, sleep hygiene. Melatonin gave weird dreams Breathing ok- no issues.  Review of Systems-see HPI  + = positive Constitutional:   No-   weight loss, night sweats, fevers, chills, +fatigue, lassitude. HEENT:   No-  headaches, difficulty swallowing, tooth/dental problems, sore throat,       No-  sneezing, itching, ear ache, nasal congestion, post nasal drip,  CV:  No-   chest pain, orthopnea, PND, swelling in lower extremities, anasarca, dizziness, palpitations Resp: +shortness of breath with exertion or at rest.               No-   productive cough,  No non-productive cough,  No- coughing up of blood.              No-   change in color of mucus.  No- wheezing.   Skin: No-   rash or lesions. GI:  No-   heartburn, indigestion, abdominal pain, nausea, vomiting,  GU:  MS:  No-   joint pain or swelling.   Neuro-     nothing unusual Psych:  No- change in mood or affect. No depression or anxiety.  No memory loss.  Objective:   Physical Exam    General- Alert, Oriented, Affect-appropriate, Distress- none acute; +medium build Skin- rash-none, lesions- none, excoriation- none Lymphadenopathy- none Head- atraumatic            Eyes- Gross vision intact, PERRLA, conjunctivae clear secretions            Ears- Hearing, canals-normal            Nose- Clear, no-Septal dev, mucus, polyps, erosion, perforation             Throat- Mallampati III , mucosa- not very dry , drainage- none, tonsils- atrophic Neck- flexible , trachea midline, no stridor , thyroid nl, carotid no bruit Chest - symmetrical excursion , unlabored           Heart/CV- RRR ,  YSAYTK+1-6/0 systolic AS , no gallop  , no rub, nl s1 s2                           - JVD- none , edema- none, stasis changes- none, varices-  none           Lung- clear to P&A, wheeze- none, cough- none , dullness-none, rub- none           Chest wall-  Abd-  Br/ Gen/ Rectal- Not done, not indicated Extrem- cyanosis- none, clubbing, none, atrophy- none, strength- nl Neuro- grossly intact to observation

## 2021-03-30 ENCOUNTER — Ambulatory Visit: Payer: Medicare PPO | Admitting: Internal Medicine

## 2021-03-30 ENCOUNTER — Encounter: Payer: Self-pay | Admitting: Internal Medicine

## 2021-03-30 ENCOUNTER — Other Ambulatory Visit: Payer: Self-pay

## 2021-03-30 VITALS — BP 130/58 | HR 67 | Temp 98.1°F | Ht 59.0 in | Wt 149.2 lb

## 2021-03-30 DIAGNOSIS — Z23 Encounter for immunization: Secondary | ICD-10-CM

## 2021-03-30 DIAGNOSIS — G4700 Insomnia, unspecified: Secondary | ICD-10-CM

## 2021-03-30 DIAGNOSIS — F5101 Primary insomnia: Secondary | ICD-10-CM

## 2021-03-30 DIAGNOSIS — G4733 Obstructive sleep apnea (adult) (pediatric): Secondary | ICD-10-CM | POA: Diagnosis not present

## 2021-03-30 DIAGNOSIS — D869 Sarcoidosis, unspecified: Secondary | ICD-10-CM

## 2021-03-30 HISTORY — DX: Insomnia, unspecified: G47.00

## 2021-03-30 MED ORDER — MOMETASONE FUROATE 50 MCG/ACT NA SUSP: 2.0000 | Freq: Every day | NASAL | 12 refills | Status: DC

## 2021-03-30 MED ORDER — TRAZODONE HCL 50 MG PO TABS: 50.0000 mg | ORAL_TABLET | Freq: Every day | ORAL | 5 refills | Status: DC

## 2021-03-30 NOTE — Patient Instructions (Signed)
We can continue CPAP auto 4-10  Order- Flu vax senior  Script sent to try trazodone 1 at bedtime for sleep as needed  Please call if we can help

## 2021-03-30 NOTE — Assessment & Plan Note (Signed)
Benefits from CPAP with good compliance and control Plan- continue CPAP auto 4-10

## 2021-03-30 NOTE — Assessment & Plan Note (Signed)
Difficulty initiating and maintaining sleep- sleep hygiene discussed Plan- try trazodone

## 2021-03-30 NOTE — Assessment & Plan Note (Signed)
Not clinically active

## 2021-04-03 DIAGNOSIS — N182 Chronic kidney disease, stage 2 (mild): Secondary | ICD-10-CM | POA: Diagnosis not present

## 2021-04-03 DIAGNOSIS — E1122 Type 2 diabetes mellitus with diabetic chronic kidney disease: Secondary | ICD-10-CM | POA: Diagnosis not present

## 2021-04-03 DIAGNOSIS — I129 Hypertensive chronic kidney disease with stage 1 through stage 4 chronic kidney disease, or unspecified chronic kidney disease: Secondary | ICD-10-CM

## 2021-04-04 ENCOUNTER — Telehealth: Payer: Self-pay

## 2021-04-04 ENCOUNTER — Telehealth: Payer: Medicare PPO

## 2021-04-04 NOTE — Telephone Encounter (Signed)
  Care Management   Follow Up Note   04/04/2021 Name: Mariah Burns MRN: [id number] DOB: Oct 29, 1945   Referred by: Glendale Chard, MD Reason for referral : Chronic Care Management (Initial RN CM Outreach - 3rd attempt )   Third unsuccessful telephone outreach was attempted today. The patient was referred to the case management team for assistance with care management and care coordination. The patient's primary care provider has been notified of our unsuccessful attempts to make or maintain contact with the patient. The care management team is pleased to engage with this patient at any time in the future should he/she be interested in assistance from the care management team.   Follow Up Plan: Telephone follow up appointment with care management team member scheduled for: 05/17/21  Barb Merino, RN, BSN, CCM Care Management Coordinator [place] [facility]  Direct Phone: [phone]

## 2021-05-08 ENCOUNTER — Telehealth: Payer: Medicare PPO

## 2021-05-17 ENCOUNTER — Ambulatory Visit: Payer: Self-pay

## 2021-05-17 ENCOUNTER — Ambulatory Visit (INDEPENDENT_AMBULATORY_CARE_PROVIDER_SITE_OTHER): Payer: Medicare PPO

## 2021-05-17 ENCOUNTER — Telehealth: Payer: Medicare PPO

## 2021-05-17 DIAGNOSIS — R413 Other amnesia: Secondary | ICD-10-CM

## 2021-05-17 DIAGNOSIS — E7800 Pure hypercholesterolemia, unspecified: Secondary | ICD-10-CM

## 2021-05-17 DIAGNOSIS — E1122 Type 2 diabetes mellitus with diabetic chronic kidney disease: Secondary | ICD-10-CM

## 2021-05-17 DIAGNOSIS — N182 Chronic kidney disease, stage 2 (mild): Secondary | ICD-10-CM

## 2021-05-17 DIAGNOSIS — I129 Hypertensive chronic kidney disease with stage 1 through stage 4 chronic kidney disease, or unspecified chronic kidney disease: Secondary | ICD-10-CM

## 2021-05-17 NOTE — Patient Instructions (Signed)
Social Worker Visit Information  Goals we discussed today:   Goals Addressed             This Visit's Progress    Cognitive Function Enhanced       Timeframe:  Long-Range Goal Priority:  High Start Date:  10.4.22                            Next planned outreach: 1.9.23  Patient Goals/Self-Care Activities patient will:   - Engage with Neurology team -Contact SW as needed prior to next scheduled call                           Obtain a better understanding of health plan benefits   On track    Timeframe:  Long-Range Goal Priority:  Low Start Date:   8.2.22                                       Next planned outreach: 1.9.23  Patient Goals/Self-Care Activities patient will:   - Museum/gallery conservator with Consulting civil engineer -Review mailed resource information -Contact SW as needed prior to next scheduled call         Materials Provided: Yes: mailed patient information on how to access health plan benefits  The patient verbalized understanding of instructions, educational materials, and care plan provided today and declined offer to receive copy of patient instructions, educational materials, and care plan.   Follow Up Plan: SW will follow up with patient by phone over the next 45 days   Daneen Schick, BSW, CDP Social Worker, Certified Dementia Practitioner [place] [facility] [phone]

## 2021-05-17 NOTE — Chronic Care Management (AMB) (Signed)
Chronic Care Management    Social Work Note  05/17/2021 Name: ANACRISTINA STEFFEK MRN: [id number] DOB: 02/04/1946  Mariah Burns is a 75 y.o. year old female who is a primary care patient of Glendale Chard, MD. The CCM team was consulted to assist the patient with chronic disease management and/or care coordination needs related to:  DM II, Hypertensive Nephropathy, Pulmonary Sarcoidosis .   Engaged with patient by telephone for follow up visit in response to provider referral for social work chronic care management and care coordination services.   Consent to Services:  The patient was given information about Chronic Care Management services, agreed to services, and gave verbal consent prior to initiation of services.  Please see initial visit note for detailed documentation.   Patient agreed to services and consent obtained.   Assessment: Review of patient past medical history, allergies, medications, and health status, including review of relevant consultants reports was performed today as part of a comprehensive evaluation and provision of chronic care management and care coordination services.     SDOH (Social Determinants of Health) assessments and interventions performed:    Advanced Directives Status: Not addressed in this encounter.  CCM Care Plan  Allergies  Allergen Reactions   Fosamax [Alendronate Sodium] Other (See Comments)    REACTION: "CAUSE GI UPSET AND FATIGUE"   Omeprazole Other (See Comments)    Lower ab pain   Motrin [Ibuprofen] Other (See Comments)    GI upset- has to eat prior to taking   Fluvastatin Other (See Comments)    mild memoryproblems of confusion     Outpatient Encounter Medications as of 05/17/2021  Medication Sig   aspirin EC 81 MG tablet Take 81 mg by mouth daily. Swallow whole.   calcium carbonate (TUMS - DOSED IN MG ELEMENTAL CALCIUM) 500 MG chewable tablet Chew 1 tablet by mouth as needed.   CALCIUM PO Take by mouth daily.    Cholecalciferol (VITAMIN D-3 PO) Take 1,000 Units by mouth daily.   Cyanocobalamin (VITAMIN B-12 PO)    diazepam (VALIUM) 2 MG tablet One tab 1 hour prior to procedure, repeat if needed   famotidine (PEPCID) 40 MG tablet as needed.   felodipine (PLENDIL) 10 MG 24 hr tablet TAKE 1 TABLET BY MOUTH EVERY DAY   fish oil-omega-3 fatty acids 1000 MG capsule Take 2 capsules by mouth daily.   Fluocinolone Acetonide Scalp 0.01 % OIL Apply to affected area of scalp as directed 1-2 x/day as needed for itching   GLUCOSAMINE-CHONDROITIN PO Take 2 tablets by mouth daily.   L-Methylfolate-Algae-B12-B6 (METANX) 3-90.314-2-35 MG CAPS Take 1 capsule by mouth 2 (two) times daily.    Magnesium 250 MG TABS Take by mouth daily.   Menthol, Topical Analgesic, (BIOFREEZE ROLL-ON EX) Apply 1 application topically as needed (pain in back,neck,thumb).   mometasone (NASONEX) 50 MCG/ACT nasal spray Place 2 sprays into the nose daily. As needed   nitroGLYCERIN (NITROSTAT) 0.4 MG SL tablet Place 1 tablet (0.4 mg total) under the tongue every 5 (five) minutes as needed for chest pain.   Polyvinyl Alcohol-Povidone PF 1.4-0.6 % SOLN Place 1 drop into both eyes daily as needed (dry eyes).    pravastatin (PRAVACHOL) 40 MG tablet Take 2 tablets (80 mg total) by mouth 3 (three) times a week. Mondays,wednesdays and fridays.   TART CHERRY PO    traZODone (DESYREL) 50 MG tablet Take 1 tablet (50 mg total) by mouth at bedtime.   TURMERIC-GINGER PO Take 1 capsule by mouth  daily.   Facility-Administered Encounter Medications as of 05/17/2021  Medication   0.9 %  sodium chloride infusion    Patient Active Problem List   Diagnosis Date Noted   Insomnia 03/30/2021   Pure hypercholesterolemia 03/07/2021   Reactive depression 11/19/2020   Dry mouth 11/19/2020   Memory loss 11/19/2020   Secondary and unspecified malignant neoplasm of axilla and upper limb lymph nodes ([place]) 10/28/2019   Class 1 obesity due to excess calories with  serious comorbidity and body mass index (BMI) of 30.0 to 30.9 in adult 10/28/2019   Vitamin D deficiency disease 10/28/2019   Chronic bilateral low back pain without sciatica 10/28/2019   Hip pain, bilateral 10/28/2019   Malignant neoplasm of upper-outer quadrant of right breast in female, estrogen receptor positive ([place]) 08/11/2018   Chronic renal disease, stage II 05/15/2018   Hypertensive nephropathy 05/15/2018   Adult BMI 29.0-29.9 kg/sq m 05/15/2018   Dyspnea on exertion 02/17/2015   Incomplete right bundle branch block 41/96/2229   Systolic murmur 79/89/2119   Pericardial effusion 02/17/2015   Osteopenia 10/05/2011   Type 2 diabetes mellitus with stage 2 chronic kidney disease, without long-term current use of insulin ([place]) 05/06/2010   SARCOIDOSIS 03/21/2007   HYPERLIPIDEMIA 03/21/2007   Obstructive sleep apnea 03/21/2007   Essential hypertension 03/21/2007    Conditions to be addressed/monitored:  DM II, Hypertensive Nephropathy, Pulmonary Sarcoidosis ;  Limited understanding of health plan benefits  Care Plan : Social Work [facility] Care Plan  Updates made by Daneen Schick since 05/17/2021 12:00 AM     Problem: Quality of Life (General Plan of Care)      Long-Range Goal: Obtain a Better Understanding of Health Plan Benefits   Start Date: 01/03/2021  This Visit's Progress: On track  Recent Progress: On track  Priority: Low  Note:   Current Barriers:  Chronic disease management support and education needs related to DM and Hypertensive Nephropathy   Limited knowledge of health plan benefit  Social Worker Clinical Goal(s):  patient will work with SW to identify and address any acute and/or chronic care coordination needs related to the self health management of DM and Hypertensive Nephropathy   Patient will work with SW to become more knowledgeable of health plan benefits  SW Interventions:  Inter-disciplinary care team collaboration (see longitudinal plan of  care) Collaboration with Glendale Chard, MD regarding development and update of comprehensive plan of care as evidenced by provider attestation and co-signature Successful outbound call placed to the patient to assist with care coordination needs Reviewed patients [employer] benefit with her telephonically - patient declines contacting her health plan at this time to redeem more gift cards Mailed the patient information on how to access this benefit throughout the plan year so she may redeem points as desired Discussed plan for SW to follow up with the patient over the next month to confirm receipt of mailing Advised the patient the RN Care Manager is scheduled to contact her this afternoon - patient indicated she will be home ready to participate in the call  Patient Goals/Self-Care Activities patient will:   -  Engage with [place] -Review mailed resource information -Contact SW as needed prior to next scheduled call  Follow Up Plan:  SW will follow up with the patient over the next 45 days     Problem: Cognitive Function      Long-Range Goal: Cognitive Function Enhanced   Start Date: 03/07/2021  This Visit's Progress: On track  Recent  Progress: On track  Priority: High  Note:   Current Barriers:  Chronic disease management support and education needs related to DM and Hypertensive Nephropathy   Cognitive Deficits  Social Worker Clinical Goal(s):  patient will work with SW to identify and address any acute and/or chronic care coordination needs related to the self health management of DM and Hypertensive Nephropathy   Patient will engage with neurologist to address concerns with memory loss  SW Interventions:  Inter-disciplinary care team collaboration (see longitudinal plan of care) Collaboration with Glendale Chard, MD regarding development and update of comprehensive plan of care as evidenced by provider attestation and co-signature Successful outbound call placed to the  patient to assess goal progression Confirmed patient is knowledgeable of appointment on 12/20 at 10:30 with Dr. Leta Baptist - no transportation barriers noted at this time  Patient Goals/Self-Care Activities patient will:   -  Engage with Neurology team -Contact SW as needed prior to next scheduled call   Follow Up Plan:  SW will follow up with the patient over the next 45 days       Follow Up Plan: SW will follow up with patient by phone over the next 45 days      Daneen Schick, BSW, CDP Social Worker, Certified Dementia Practitioner [facility] [phone]

## 2021-05-18 NOTE — Patient Instructions (Signed)
Visit Information   Thank you for taking time to visit with me today. Please don't hesitate to contact me if I can be of assistance to you before our next scheduled telephone appointment.  Following are the goals we discussed today:  (Copy and paste patient goals from clinical care plan here)  Our next appointment is by telephone on 06/20/21 at 12:50 PM   Please call the care guide team at [phone] if you need to cancel or reschedule your appointment.   If you are experiencing a Mental Health or [place] or need someone to talk to, please call 1-800-273-TALK (toll free, 24 hour hotline)   Following is a copy of your full care plan:  Care Plan : RN Care Manager plan of care  Updates made by Lynne Logan, RN since 05/17/2021 12:00 AM     Problem: No plan of care established for management of chronic disease states (DM II, Hypertensive Nephropathy, HLD)   Priority: High     Long-Range Goal: Development of plan of care for management of chronic disease management (DM II, Hypertensive Nephropathy, HLD)   Start Date: 05/17/2021  Expected End Date: 05/17/2022  This Visit's Progress: On track  Priority: High  Note:   Current Barriers:  Knowledge Deficits related to plan of care for management of DM II, Hypertensive Nephropathy, HLD  Chronic Disease Management support and education needs related to DM II, Hypertensive Nephropathy, HLD   RNCM Clinical Goal(s):  Patient will demonstrate Ongoing health management independence as evidenced by patient will report 100% adherence to her prescribed treatment plan  continue to work with RN Care Manager to address care management and care coordination needs related to  DM II, Hypertensive Nephropathy, HLD as evidenced by adherence to CM Team Scheduled appointments through collaboration with RN Care manager, provider, and care team.   Interventions: 1:1 collaboration with primary care provider regarding development and update  of comprehensive plan of care as evidenced by provider attestation and co-signature Inter-disciplinary care team collaboration (see longitudinal plan of care) Evaluation of current treatment plan related to  self management and patient's adherence to plan as established by provider   Diabetes Interventions:  (Status:  New goal.) Long Term Goal Assessed patient's understanding of A1c goal: <6.5% Provided education to patient about basic DM disease process Reviewed medications with patient and discussed importance of medication adherence Counseled on importance of regular laboratory monitoring as prescribed Provided patient with written educational materials related to hypo and hyperglycemia and importance of correct treatment Advised patient, providing education and rationale, to check cbg daily before meals and record, calling PCP and or RN CM for findings outside established parameters Review of patient status, including review of consultants reports, relevant laboratory and other test results, and medications completed Discussed plans with patient for ongoing care management follow up and provided patient with direct contact information for care management team Lab Results  Component Value Date   HGBA1C 6.0 (H) 03/01/2021   Patient Goals/Self-Care Activities: Take all medications as prescribed Attend all scheduled provider appointments Call pharmacy for medication refills 3-7 days in advance of running out of medications Perform all self care activities independently  Perform IADL's (shopping, preparing meals, housekeeping, managing finances) independently Call provider office for new concerns or questions  drink 6 to 8 glasses of water each day fill half of plate with vegetables manage portion size  Follow Up Plan:  Telephone follow up appointment with care management team member scheduled for:  06/20/21  Consent to CCM Services: Ms. Basso was given information about  Chronic Care Management services including:  CCM service includes personalized support from designated clinical staff supervised by her physician, including individualized plan of care and coordination with other care providers 24/7 contact phone numbers for assistance for urgent and routine care needs. Service will only be billed when office clinical staff spend 20 minutes or more in a month to coordinate care. Only one practitioner may furnish and bill the service in a calendar month. The patient may stop CCM services at any time (effective at the end of the month) by phone call to the office staff. The patient will be responsible for cost sharing (co-pay) of up to 20% of the service fee (after annual deductible is met).  Patient agreed to services and verbal consent obtained.   The patient verbalized understanding of instructions, educational materials, and care plan provided today and declined offer to receive copy of patient instructions, educational materials, and care plan.   Telephone follow up appointment with care management team member scheduled for: 06/20/21

## 2021-05-18 NOTE — Chronic Care Management (AMB) (Signed)
Chronic Care Management   CCM RN Visit Note  05/17/2021 Name: Mariah Burns MRN: [id number] DOB: 1945/10/11  Subjective: Mariah Burns is a 75 y.o. year old female who is a primary care patient of Glendale Chard, MD. The care management team was consulted for assistance with disease management and care coordination needs.    Engaged with patient by telephone for initial visit in response to provider referral for case management and/or care coordination services.   Consent to Services:  The patient was given information about Chronic Care Management services, agreed to services, and gave verbal consent prior to initiation of services.  Please see initial visit note for detailed documentation.   Patient agreed to services and verbal consent obtained.   Assessment: Review of patient past medical history, allergies, medications, health status, including review of consultants reports, laboratory and other test data, was performed as part of comprehensive evaluation and provision of chronic care management services.   SDOH (Social Determinants of Health) assessments and interventions performed:  Yes, no acute challenges  CCM Care Plan  Allergies  Allergen Reactions   Fosamax [Alendronate Sodium] Other (See Comments)    REACTION: "CAUSE GI UPSET AND FATIGUE"   Omeprazole Other (See Comments)    Lower ab pain   Motrin [Ibuprofen] Other (See Comments)    GI upset- has to eat prior to taking   Fluvastatin Other (See Comments)    mild memoryproblems of confusion     Outpatient Encounter Medications as of 05/17/2021  Medication Sig   aspirin EC 81 MG tablet Take 81 mg by mouth daily. Swallow whole.   calcium carbonate (TUMS - DOSED IN MG ELEMENTAL CALCIUM) 500 MG chewable tablet Chew 1 tablet by mouth as needed.   CALCIUM PO Take by mouth daily.   Cholecalciferol (VITAMIN D-3 PO) Take 1,000 Units by mouth daily.   Cyanocobalamin (VITAMIN B-12 PO)    diazepam (VALIUM) 2 MG  tablet One tab 1 hour prior to procedure, repeat if needed   famotidine (PEPCID) 40 MG tablet as needed.   felodipine (PLENDIL) 10 MG 24 hr tablet TAKE 1 TABLET BY MOUTH EVERY DAY   fish oil-omega-3 fatty acids 1000 MG capsule Take 2 capsules by mouth daily.   Fluocinolone Acetonide Scalp 0.01 % OIL Apply to affected area of scalp as directed 1-2 x/day as needed for itching   GLUCOSAMINE-CHONDROITIN PO Take 2 tablets by mouth daily.   L-Methylfolate-Algae-B12-B6 (METANX) 3-90.314-2-35 MG CAPS Take 1 capsule by mouth 2 (two) times daily.    Magnesium 250 MG TABS Take by mouth daily.   Menthol, Topical Analgesic, (BIOFREEZE ROLL-ON EX) Apply 1 application topically as needed (pain in back,neck,thumb).   mometasone (NASONEX) 50 MCG/ACT nasal spray Place 2 sprays into the nose daily. As needed   nitroGLYCERIN (NITROSTAT) 0.4 MG SL tablet Place 1 tablet (0.4 mg total) under the tongue every 5 (five) minutes as needed for chest pain.   Polyvinyl Alcohol-Povidone PF 1.4-0.6 % SOLN Place 1 drop into both eyes daily as needed (dry eyes).    pravastatin (PRAVACHOL) 40 MG tablet Take 2 tablets (80 mg total) by mouth 3 (three) times a week. Mondays,wednesdays and fridays.   TART CHERRY PO    traZODone (DESYREL) 50 MG tablet Take 1 tablet (50 mg total) by mouth at bedtime.   TURMERIC-GINGER PO Take 1 capsule by mouth daily.   Facility-Administered Encounter Medications as of 05/17/2021  Medication   0.9 %  sodium chloride infusion    Patient  Active Problem List   Diagnosis Date Noted   Insomnia 03/30/2021   Pure hypercholesterolemia 03/07/2021   Reactive depression 11/19/2020   Dry mouth 11/19/2020   Memory loss 11/19/2020   Secondary and unspecified malignant neoplasm of axilla and upper limb lymph nodes ([place]) 10/28/2019   Class 1 obesity due to excess calories with serious comorbidity and body mass index (BMI) of 30.0 to 30.9 in adult 10/28/2019   Vitamin D deficiency disease 10/28/2019    Chronic bilateral low back pain without sciatica 10/28/2019   Hip pain, bilateral 10/28/2019   Malignant neoplasm of upper-outer quadrant of right breast in female, estrogen receptor positive ([place]) 08/11/2018   Chronic renal disease, stage II 05/15/2018   Hypertensive nephropathy 05/15/2018   Adult BMI 29.0-29.9 kg/sq m 05/15/2018   Dyspnea on exertion 02/17/2015   Incomplete right bundle branch block 84/13/2440   Systolic murmur 03/31/2535   Pericardial effusion 02/17/2015   Osteopenia 10/05/2011   Type 2 diabetes mellitus with stage 2 chronic kidney disease, without long-term current use of insulin ([place]) 05/06/2010   SARCOIDOSIS 03/21/2007   HYPERLIPIDEMIA 03/21/2007   Obstructive sleep apnea 03/21/2007   Essential hypertension 03/21/2007    Conditions to be addressed/monitored: DM II, Hypertensive Nephropathy, HLD  Care Plan : RN Care Manager plan of care  Updates made by Lynne Logan, RN since 05/17/2021 12:00 AM     Problem: No plan of care established for management of chronic disease states (DM II, Hypertensive Nephropathy, HLD)   Priority: High     Long-Range Goal: Development of plan of care for management of chronic disease management (DM II, Hypertensive Nephropathy, HLD)   Start Date: 05/17/2021  Expected End Date: 05/17/2022  This Visit's Progress: On track  Priority: High  Note:   Current Barriers:  Knowledge Deficits related to plan of care for management of DM II, Hypertensive Nephropathy, HLD  Chronic Disease Management support and education needs related to DM II, Hypertensive Nephropathy, HLD   RNCM Clinical Goal(s):  Patient will demonstrate Ongoing health management independence as evidenced by patient will report 100% adherence to her prescribed treatment plan  continue to work with RN Care Manager to address care management and care coordination needs related to  DM II, Hypertensive Nephropathy, HLD as evidenced by adherence to CM Team Scheduled  appointments through collaboration with RN Care manager, provider, and care team.   Interventions: 1:1 collaboration with primary care provider regarding development and update of comprehensive plan of care as evidenced by provider attestation and co-signature Inter-disciplinary care team collaboration (see longitudinal plan of care) Evaluation of current treatment plan related to  self management and patient's adherence to plan as established by provider   Diabetes Interventions:  (Status:  New goal.) Long Term Goal Assessed patient's understanding of A1c goal: <6.5% Provided education to patient about basic DM disease process Reviewed medications with patient and discussed importance of medication adherence Counseled on importance of regular laboratory monitoring as prescribed Provided patient with written educational materials related to hypo and hyperglycemia and importance of correct treatment Advised patient, providing education and rationale, to check cbg daily before meals and record, calling PCP and or RN CM for findings outside established parameters Review of patient status, including review of consultants reports, relevant laboratory and other test results, and medications completed Discussed plans with patient for ongoing care management follow up and provided patient with direct contact information for care management team Lab Results  Component Value Date   HGBA1C 6.0 (H) 03/01/2021  Patient Goals/Self-Care Activities: Take all medications as prescribed Attend all scheduled provider appointments Call pharmacy for medication refills 3-7 days in advance of running out of medications Perform all self care activities independently  Perform IADL's (shopping, preparing meals, housekeeping, managing finances) independently Call provider office for new concerns or questions  drink 6 to 8 glasses of water each day fill half of plate with vegetables manage portion size  Follow  Up Plan:  Telephone follow up appointment with care management team member scheduled for:  06/20/21      Plan:Telephone follow up appointment with care management team member scheduled for:  06/20/21  Barb Merino, RN, BSN, CCM Care Management Coordinator [place] [facility]  Direct Phone: [phone]

## 2021-05-23 ENCOUNTER — Other Ambulatory Visit: Payer: Self-pay

## 2021-05-23 ENCOUNTER — Ambulatory Visit: Payer: Medicare PPO | Admitting: Diagnostic Neuroimaging

## 2021-05-23 ENCOUNTER — Encounter: Payer: Self-pay | Admitting: Diagnostic Neuroimaging

## 2021-05-23 VITALS — BP 168/75 | HR 56 | Ht 59.0 in | Wt 149.6 lb

## 2021-05-23 DIAGNOSIS — G3184 Mild cognitive impairment, so stated: Secondary | ICD-10-CM

## 2021-05-23 NOTE — Progress Notes (Signed)
[facility]  PATIENT: Mariah Burns DOB: 04/24/1946  REFERRING CLINICIAN: Glendale Chard, MD  HISTORY FROM: patient  REASON FOR VISIT: NEW CONSULT   HISTORICAL  CHIEF COMPLAINT:  Chief Complaint  Patient presents with   Memory Loss    Rm 6 New Pt MMSE 24 "not as alert as I used to be, short term memory loss"     HISTORY OF PRESENT ILLNESS:   UPDATE (05/23/21, VRP): Since last visit, now having progressive memory loss.more issues with navigation, short term memory recall. No major changes in ADLs otherwise. Still shopping, driving, independent overall. Daughter lives with patient.   PRIOR HPI (09/01/13): 75 year old female here for evaluation of post traumatic headaches since 07/25/13.  Patient was walking, slipped on ice, fell and struck her head. She had significant stunned and dazed feeling. She had pain on the right side of her head and left occipital region. Patient was able to stand up and go inside. When patient's daughter came home they went to urgent care and then referred emergency room. Patient was diagnosed with concussion. Neuroimaging studies are unremarkable.  Since that time patient has had intermittent headaches, blurred vision, photosensitivity, photophobia phonophobia, decreased attention, verbal and cognitive difficulties. She's also having more agitation lately.   REVIEW OF SYSTEMS: Full 14 system review of systems performed and neg except: as per HPI.   ALLERGIES: Allergies  Allergen Reactions   Fosamax [Alendronate Sodium] Other (See Comments)    REACTION: "CAUSE GI UPSET AND FATIGUE"   Omeprazole Other (See Comments)    Lower ab pain   Motrin [Ibuprofen] Other (See Comments)    GI upset- has to eat prior to taking   Fluvastatin Other (See Comments)    mild memoryproblems of confusion     HOME MEDICATIONS: Outpatient Medications Prior to Visit  Medication Sig Dispense Refill   aspirin EC 81 MG tablet Take 81 mg by mouth  daily. Swallow whole.     calcium carbonate (TUMS - DOSED IN MG ELEMENTAL CALCIUM) 500 MG chewable tablet Chew 1 tablet by mouth as needed.     CALCIUM PO Take by mouth daily.     Cholecalciferol (VITAMIN D-3 PO) Take 1,000 Units by mouth daily.     Cyanocobalamin (VITAMIN B-12 PO)      diazepam (VALIUM) 2 MG tablet One tab 1 hour prior to procedure, repeat if needed 2 tablet 0   famotidine (PEPCID) 40 MG tablet as needed.     felodipine (PLENDIL) 10 MG 24 hr tablet TAKE 1 TABLET BY MOUTH EVERY DAY 90 tablet 2   fish oil-omega-3 fatty acids 1000 MG capsule Take 2 capsules by mouth daily.     Fluocinolone Acetonide Scalp 0.01 % OIL Apply to affected area of scalp as directed 1-2 x/day as needed for itching 118.28 mL 1   GLUCOSAMINE-CHONDROITIN PO Take 2 tablets by mouth daily.     L-Methylfolate-Algae-B12-B6 (METANX) 3-90.314-2-35 MG CAPS Take 1 capsule by mouth 2 (two) times daily.      Magnesium 250 MG TABS Take by mouth daily.     Menthol, Topical Analgesic, (BIOFREEZE ROLL-ON EX) Apply 1 application topically as needed (pain in back,neck,thumb).     mometasone (NASONEX) 50 MCG/ACT nasal spray Place 2 sprays into the nose daily. As needed 1 each 12   nitroGLYCERIN (NITROSTAT) 0.4 MG SL tablet Place 1 tablet (0.4 mg total) under the tongue every 5 (five) minutes as needed for chest pain. 25 tablet 2   Polyvinyl Alcohol-Povidone PF  1.4-0.6 % SOLN Place 1 drop into both eyes daily as needed (dry eyes).      pravastatin (PRAVACHOL) 40 MG tablet Take 2 tablets (80 mg total) by mouth 3 (three) times a week. Mondays,wednesdays and fridays. 45 tablet 3   TART CHERRY PO      traZODone (DESYREL) 50 MG tablet Take 1 tablet (50 mg total) by mouth at bedtime. 30 tablet 5   TURMERIC-GINGER PO Take 1 capsule by mouth daily.     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: failed Imuran & Prednisone therapy d/t adverse side effects TLC 106%, DLCO 75% 2009    PAST SURGICAL HISTORY: Past Surgical History:  Procedure Laterality Date   BREAST LUMPECTOMY WITH RADIOACTIVE SEED AND SENTINEL LYMPH NODE BIOPSY Right 09/02/2018   Procedure: RIGHT BREAST LUMPECTOMY WITH RADIOACTIVE SEED AND  RIGHT SENTINEL LYMPH NODE BIOPSY;  Surgeon: Excell Seltzer, MD;  Location: [facility];  Service: General;  Laterality: Right;   BRONCHOSCOPY     dx'd sarcoid   KNEE ARTHROPLASTY Left 03/13/2017   NEUROPLASTY / TRANSPOSITION ULNAR NERVE AT ELBOW  09/2011   TOTAL ABDOMINAL HYSTERECTOMY      FAMILY HISTORY: Family History  Problem Relation Age of Onset   Allergies Sister    Heart disease Sister    Lupus Sister    Allergies Mother    Allergies Daughter    Healthy Daughter    Heart disease Sister    Gout Brother    Hypertension Brother    Heart disease Brother    Heart failure Father    Gout Brother    Hypertension Brother    Hypertension Brother    Hypertension Brother    Post-traumatic stress disorder Brother    Healthy Daughter     SOCIAL HISTORY:  Social History   Socioeconomic History   Marital status: Divorced    Spouse name: Not on file   Number of children: 3   Years of education: College   Highest education level: Not on file  Occupational History   Occupation: Retired    Fish farm manager: RETIRED  Tobacco Use   Smoking status: Never   Smokeless tobacco: Never  Vaping Use   Vaping Use: Never used  Substance and Sexual Activity   Alcohol use: No   Drug use: No   Sexual activity: Not Currently  Other Topics  Concern   Not on file  Social History Narrative   05/23/21 Patient lives at home with youngest daughter.   Caffeine Use: none   Social Determinants of Radio broadcast assistant Strain: Low Risk    Difficulty of Paying Living Expenses: Not hard at all  Food Insecurity: No Food Insecurity   Worried About Charity fundraiser in the Last Year: Never true   Ran Out of Food in the Last Year: Never true  Transportation Needs: No Transportation Needs   Lack of Transportation (Medical):  No   Lack of Transportation (Non-Medical): No  Physical Activity: Sufficiently Active   Days of Exercise per Week: 5 days   Minutes of Exercise per Session: 60 min  Stress: No Stress Concern Present   Feeling of Stress : Not at all  Social Connections: Not on file  Intimate Partner Violence: Not on file     PHYSICAL EXAM  GENERAL EXAM/CONSTITUTIONAL: Vitals:  Vitals:   05/23/21 1016  BP: (!) 168/75  Pulse: (!) 56  Weight: 149 lb 9.6 oz (67.9 kg)  Height: 4\' 11"  (1.499 m)   Body mass index is 30.22 kg/m. Wt Readings from Last 3 Encounters:  05/23/21 149 lb 9.6 oz (67.9 kg)  03/30/21 149 lb 3.2 oz (67.7 kg)  03/15/21 147 lb 3.2 oz (66.8 kg)   Patient is in no distress; well developed, nourished and groomed; neck is supple  CARDIOVASCULAR: Examination of carotid arteries is normal; no carotid bruits Regular rate and rhythm, no murmurs Examination of peripheral vascular system by observation and palpation is normal  EYES: Ophthalmoscopic exam of optic discs and posterior segments is normal; no papilledema or hemorrhages No results found.  MUSCULOSKELETAL: Gait, strength, tone, movements noted in Neurologic exam below  NEUROLOGIC: MENTAL STATUS:  MMSE - Mini Mental State Exam 05/23/2021  Orientation to time 5  Orientation to Place 4  Registration 3  Attention/ Calculation 3  Recall 1  Language- name 2 objects 2  Language- repeat 0  Language- follow 3 step command 3  Language-  read & follow direction 1  Write a sentence 1  Copy design 1  Total score 24   awake, alert, oriented to person, place and time recent and remote memory intact normal attention and concentration language fluent, comprehension intact, naming intact fund of knowledge appropriate  CRANIAL NERVE:  2nd - no papilledema on fundoscopic exam 2nd, 3rd, 4th, 6th - pupils equal and reactive to light, visual fields full to confrontation, extraocular muscles intact, no nystagmus 5th - facial sensation symmetric 7th - facial strength symmetric 8th - hearing intact 9th - palate elevates symmetrically, uvula midline 11th - shoulder shrug symmetric 12th - tongue protrusion midline  MOTOR:  normal bulk and tone, full strength in the BUE, BLE  SENSORY:  normal and symmetric to light touch, temperature, vibration  COORDINATION:  finger-nose-finger, fine finger movements normal  REFLEXES:  deep tendon reflexes TRACE and symmetric  GAIT/STATION:  narrow based gait    DIAGNOSTIC DATA (LABS, IMAGING, TESTING) - I reviewed patient records, labs, notes, testing and imaging myself where available.  Lab Results  Component Value Date   WBC 4.1 02/23/2021   HGB 11.6 (L) 02/23/2021   HCT 35.1 (L) 02/23/2021   MCV 87.3 02/23/2021   PLT 194 02/23/2021      Component Value Date/Time   NA 143 02/23/2021 0859   NA 140 11/02/2020 1036   K 3.5 02/23/2021 0859   CL 109 02/23/2021 0859   CO2 24 02/23/2021 0859   GLUCOSE 98 02/23/2021 0859   BUN 12 02/23/2021 0859   BUN 13 11/02/2020 1036   CREATININE 0.87 02/23/2021 0859   CREATININE 1.07 (H) 08/11/2020 1414   CALCIUM 9.7 02/23/2021 0859   PROT 7.9 02/23/2021 0859   PROT 7.2 11/02/2020 1036   ALBUMIN 3.9 02/23/2021 0859   ALBUMIN 4.4 02/23/2020 1428   AST 18 02/23/2021 0859   AST 19 08/11/2020 1414   ALT 13 02/23/2021 0859   ALT 16 08/11/2020 1414   ALKPHOS 71 02/23/2021  0859   BILITOT 0.6 02/23/2021 0859   BILITOT 0.4 08/11/2020 1414    GFRNONAA >60 02/23/2021 0859   GFRNONAA 54 (L) 08/11/2020 1414   GFRAA 85 05/26/2020 1232   GFRAA >60 08/12/2019 1035   Lab Results  Component Value Date   CHOL 177 11/02/2020   Lab Results  Component Value Date   HGBA1C 6.0 (H) 03/01/2021   Lab Results  Component Value Date   VITAMINB12 >2000 (H) 02/23/2021   Lab Results  Component Value Date   TSH 1.170 11/02/2020    07/25/13 CT HEAD  - Normal and stable examination.  03/21/21 MRI brain Mildly motion degraded exam.   No evidence of acute intracranial abnormality.   Mild chronic small vessel ischemic changes within the cerebral white matter and pons.   Mild generalized cerebral and cerebellar atrophy.    ASSESSMENT AND PLAN  75 y.o. 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 Neuro exam unremarkable. Also continue to struggle with insomnia (since 2005).  Dx:  1. MCI (mild cognitive impairment)      PLAN:  MEMORY LOSS (likely mild cognitive impairment) - safety / supervision issues reviewed - daily physical activity / exercise (at least 15-30 minutes) - eat more plants / vegetables - increase social activities, brain stimulation, games, puzzles, hobbies, crafts, arts, music - aim for at least 7-8 hours sleep per night (or more) - avoid smoking and alcohol - caregiver resources provided - caution with medications, finances, driving  Return for return to PCP.    Penni Bombard, MD 90/38/3338, 32:91 AM Certified in Neurology, Neurophysiology and Neuroimaging  [facility] [address], [place], [place] [address] [phone]

## 2021-05-23 NOTE — Patient Instructions (Signed)
°  MEMORY LOSS (likely mild cognitive impairment) - safety / supervision issues reviewed - daily physical activity / exercise (at least 15-30 minutes) - eat more plants / vegetables - increase social activities, brain stimulation, games, puzzles, hobbies, crafts, arts, music - aim for at least 7-8 hours sleep per night (or more) - avoid smoking and alcohol - caregiver resources provided - caution with medications, finances, driving

## 2021-05-24 DIAGNOSIS — B351 Tinea unguium: Secondary | ICD-10-CM | POA: Diagnosis not present

## 2021-05-24 DIAGNOSIS — M792 Neuralgia and neuritis, unspecified: Secondary | ICD-10-CM | POA: Diagnosis not present

## 2021-05-24 DIAGNOSIS — E1142 Type 2 diabetes mellitus with diabetic polyneuropathy: Secondary | ICD-10-CM | POA: Diagnosis not present

## 2021-05-24 DIAGNOSIS — M19079 Primary osteoarthritis, unspecified ankle and foot: Secondary | ICD-10-CM | POA: Diagnosis not present

## 2021-05-24 DIAGNOSIS — L603 Nail dystrophy: Secondary | ICD-10-CM | POA: Diagnosis not present

## 2021-06-03 DIAGNOSIS — N182 Chronic kidney disease, stage 2 (mild): Secondary | ICD-10-CM

## 2021-06-03 DIAGNOSIS — E7800 Pure hypercholesterolemia, unspecified: Secondary | ICD-10-CM

## 2021-06-03 DIAGNOSIS — I129 Hypertensive chronic kidney disease with stage 1 through stage 4 chronic kidney disease, or unspecified chronic kidney disease: Secondary | ICD-10-CM

## 2021-06-03 DIAGNOSIS — E1122 Type 2 diabetes mellitus with diabetic chronic kidney disease: Secondary | ICD-10-CM

## 2021-06-06 ENCOUNTER — Encounter: Payer: Self-pay | Admitting: Internal Medicine

## 2021-06-06 ENCOUNTER — Other Ambulatory Visit: Payer: Self-pay

## 2021-06-06 ENCOUNTER — Ambulatory Visit: Payer: Medicare PPO | Admitting: Internal Medicine

## 2021-06-06 VITALS — BP 134/86 | HR 59 | Temp 98.3°F | Ht 59.0 in | Wt 149.4 lb

## 2021-06-06 DIAGNOSIS — E1122 Type 2 diabetes mellitus with diabetic chronic kidney disease: Secondary | ICD-10-CM

## 2021-06-06 DIAGNOSIS — N182 Chronic kidney disease, stage 2 (mild): Secondary | ICD-10-CM | POA: Diagnosis not present

## 2021-06-06 DIAGNOSIS — D860 Sarcoidosis of lung: Secondary | ICD-10-CM | POA: Diagnosis not present

## 2021-06-06 DIAGNOSIS — R413 Other amnesia: Secondary | ICD-10-CM

## 2021-06-06 DIAGNOSIS — I129 Hypertensive chronic kidney disease with stage 1 through stage 4 chronic kidney disease, or unspecified chronic kidney disease: Secondary | ICD-10-CM | POA: Diagnosis not present

## 2021-06-06 DIAGNOSIS — I5032 Chronic diastolic (congestive) heart failure: Secondary | ICD-10-CM

## 2021-06-06 DIAGNOSIS — Z6830 Body mass index (BMI) 30.0-30.9, adult: Secondary | ICD-10-CM

## 2021-06-06 DIAGNOSIS — E6609 Other obesity due to excess calories: Secondary | ICD-10-CM | POA: Diagnosis not present

## 2021-06-06 DIAGNOSIS — F4321 Adjustment disorder with depressed mood: Secondary | ICD-10-CM | POA: Diagnosis not present

## 2021-06-06 DIAGNOSIS — I130 Hypertensive heart and chronic kidney disease with heart failure and stage 1 through stage 4 chronic kidney disease, or unspecified chronic kidney disease: Secondary | ICD-10-CM

## 2021-06-06 DIAGNOSIS — F5101 Primary insomnia: Secondary | ICD-10-CM | POA: Diagnosis not present

## 2021-06-06 HISTORY — DX: Chronic diastolic (congestive) heart failure: I50.32

## 2021-06-06 NOTE — Progress Notes (Addendum)
I,[name],acting as a Education administrator for [name], MD.,have documented all relevant documentation on the behalf of [name], MD,as directed by  [name], MD while in the presence of [name], MD.  This visit occurred during the SARS-CoV-2 public health emergency.  Safety protocols were in place, including screening questions prior to the visit, additional usage of staff PPE, and extensive cleaning of exam room while observing appropriate contact time as indicated for disinfecting solutions.  Subjective:     Patient ID: Mariah Burns , female    DOB: 12-05-1945 , 76 y.o.   MRN: [id number]   Chief Complaint  Patient presents with   Diabetes   Hypertension    HPI  The patient is here today for a diabetes and blood pressure f/u.  She reports compliance with meds. She denies headaches, chest pain and shortness of breath. She admits she is under a lot of stress. One of her daughters lives with her, which causes her a lot of stress. She does not think her living arrangements will change in the near future.   Diabetes She presents for her follow-up diabetic visit. She has type 2 diabetes mellitus. Her disease course has been stable. Pertinent negatives for diabetes include no blurred vision. There are no hypoglycemic complications. Diabetic complications include nephropathy. Risk factors for coronary artery disease include diabetes mellitus, dyslipidemia, hypertension, sedentary lifestyle and post-menopausal. She is compliant with treatment most of the time.  Hypertension This is a chronic problem. The current episode started more than 1 year ago. The problem has been gradually improving since onset. The problem is controlled. Pertinent negatives include no blurred vision.    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: failed Imuran & Prednisone therapy d/t adverse side effects TLC 106%, DLCO 75% 2009     Family History  Problem Relation Age of Onset   Allergies Sister    Heart disease Sister    Lupus Sister    Allergies Mother    Allergies Daughter    Healthy Daughter    Heart disease Sister    Gout Brother    Hypertension Brother    Heart disease Brother    Heart failure Father    Gout Brother    Hypertension Brother    Hypertension Brother    Hypertension Brother    Post-traumatic stress disorder Brother    Healthy Daughter      Current Outpatient Medications:    aspirin EC 81 MG tablet, Take 81 mg by mouth daily. Swallow whole., Disp: , Rfl:    calcium carbonate (TUMS - DOSED IN MG ELEMENTAL CALCIUM) 500 MG chewable tablet, Chew 1 tablet by mouth as needed., Disp: , Rfl:    CALCIUM PO, Take by mouth daily., Disp: , Rfl:    Cholecalciferol (VITAMIN D-3 PO), Take 1,000 Units by mouth daily., Disp: , Rfl:    Cyanocobalamin (VITAMIN B-12 PO), , Disp: , Rfl:    famotidine (PEPCID) 40 MG tablet, as needed., Disp: , Rfl:    felodipine (PLENDIL) 10 MG 24 hr tablet, TAKE 1 TABLET BY MOUTH EVERY DAY, Disp: 90 tablet, Rfl: 2  fish oil-omega-3 fatty acids 1000 MG capsule, Take 2 capsules by mouth daily., Disp: , Rfl:    Fluocinolone Acetonide Scalp 0.01 % OIL, Apply to affected area of scalp as directed 1-2 x/day as needed for itching, Disp: 118.28 mL, Rfl: 1   GLUCOSAMINE-CHONDROITIN PO, Take 2 tablets by mouth daily., Disp: , Rfl:    L-Methylfolate-Algae-B12-B6 (METANX) 3-90.314-2-35 MG CAPS, Take 1 capsule by mouth 2 (two) times daily. , Disp: , Rfl:    Magnesium 250 MG TABS, Take by mouth daily. Tues, Thursday, Saturday, Disp: , Rfl:    nitroGLYCERIN (NITROSTAT) 0.4 MG SL tablet, Place 1 tablet (0.4 mg total) under the tongue every 5 (five) minutes as needed for chest pain., Disp: 25  tablet, Rfl: 2   Polyvinyl Alcohol-Povidone PF 1.4-0.6 % SOLN, Place 1 drop into both eyes daily as needed (dry eyes). , Disp: , Rfl:    pravastatin (PRAVACHOL) 40 MG tablet, Take 2 tablets (80 mg total) by mouth 3 (three) times a week. Mondays,wednesdays and fridays., Disp: 45 tablet, Rfl: 3   TART CHERRY PO, , Disp: , Rfl:    Menthol, Topical Analgesic, (BIOFREEZE ROLL-ON EX), Apply 1 application topically as needed (pain in back,neck,thumb). (Patient not taking: Reported on 06/06/2021), Disp: , Rfl:    mometasone (NASONEX) 50 MCG/ACT nasal spray, Place 2 sprays into the nose daily. As needed (Patient not taking: Reported on 06/06/2021), Disp: 1 each, Rfl: 12   traZODone (DESYREL) 50 MG tablet, Take 1 tablet (50 mg total) by mouth at bedtime. (Patient not taking: Reported on 06/06/2021), Disp: 30 tablet, Rfl: 5  Current Facility-Administered Medications:    0.9 %  sodium chloride infusion, , Intravenous, PRN, Minette Brine, FNP   Allergies  Allergen Reactions   Fosamax [Alendronate Sodium] Other (See Comments)    REACTION: "CAUSE GI UPSET AND FATIGUE"   Omeprazole Other (See Comments)    Lower ab pain   Motrin [Ibuprofen] Other (See Comments)    GI upset- has to eat prior to taking   Fluvastatin Other (See Comments)    mild memoryproblems of confusion      Review of Systems  Constitutional: Negative.   Eyes:  Negative for blurred vision.  Respiratory: Negative.    Cardiovascular: Negative.   Gastrointestinal: Negative.   Neurological: Negative.   Psychiatric/Behavioral:  Positive for dysphoric mood and sleep disturbance.     Today's Vitals   06/06/21 1116  BP: 134/86  Pulse: (!) 59  Temp: 98.3 F (36.8 C)  Weight: 149 lb 6.4 oz (67.8 kg)  Height: 4' 11"  (1.499 m)   Body mass index is 30.18 kg/m.  Wt Readings from Last 3 Encounters:  06/06/21 149 lb 6.4 oz (67.8 kg)  05/23/21 149 lb 9.6 oz (67.9 kg)  03/30/21 149 lb 3.2 oz (67.7 kg)    BP Readings from Last 3 Encounters:   06/06/21 134/86  05/23/21 (!) 168/75  03/30/21 (!) 130/58    Objective:  Physical Exam Vitals and nursing note reviewed.  Constitutional:      Appearance: Normal appearance.  HENT:     Head: Normocephalic and atraumatic.     Nose:     Comments: Masked     Mouth/Throat:     Comments: Masked  Cardiovascular:     Rate and Rhythm: Normal rate and regular rhythm.     Heart sounds: Normal heart sounds.  Pulmonary:     Effort: Pulmonary effort is normal.     Breath sounds: Normal breath sounds.  Musculoskeletal:  Cervical back: Normal range of motion.  Skin:    General: Skin is warm.  Neurological:     General: No focal deficit present.     Mental Status: She is alert.  Psychiatric:        Mood and Affect: Mood normal.        Behavior: Behavior normal.        Assessment And Plan:     1. Type 2 diabetes mellitus with stage 2 chronic kidney disease, without long-term current use of insulin (HCC) Comments: Chronic, I will check labs as listed below. I will request 2022 eye exam from Dr. Venetia Maxon. She is encouraged to aim for at least 150 minutes of exercise/wk.  I will refer her to home health nursing for DM/HTN education.  - Hemoglobin A1c - CMP14+EGFR - Lipid panel  2. Hypertensive heart and renal disease with renal failure, stage 1 through stage 4 or unspecified chronic kidney disease, with heart failure (HCC) Comments: Chronic, fair control. Goal BP <130/80. Advised to follow a low sodium diet. No med changes today. I will refer her to Home health for DM/HTN education and to review meds.  - CMP14+EGFR - Lipid panel  3. Chronic diastolic heart failure ([place]) Comments: She is advised to notify myself/Cardiology should she develop worsening SOB/LE edema.   4. Adjustment disorder with depressed mood Comments: She declines meds/therapy at this time.   5. Pulmonary sarcoidosis ([place]) Comments: Chronic, followed by Pulmonary. Her sx have been stable.   6. Primary  insomnia Comments: Chronic, she is encouraged to practice good bedtime hygiene. She does not tolerate trazodone, does not wish to try any other medications.   7. Memory loss Comments: MCI as per Neuro, no treatment was initiated. Sx likely exacerbated by her family stressors.   8. Class 1 obesity due to excess calories with serious comorbidity and body mass index (BMI) of 30.0 to 30.9 in adult Her BMI is acceptable for her demographic. She is advised to aim for at least 150 minutes of exercise per week.    Patient was given opportunity to ask questions. Patient verbalized understanding of the plan and was able to repeat key elements of the plan. All questions were answered to their satisfaction.   I, [name], MD, have reviewed all documentation for this visit. The documentation on 06/18/21 for the exam, diagnosis, procedures, and orders are all accurate and complete.   IF YOU HAVE BEEN REFERRED TO A SPECIALIST, IT MAY TAKE 1-2 WEEKS TO SCHEDULE/PROCESS THE REFERRAL. IF YOU HAVE NOT HEARD FROM US/SPECIALIST IN TWO WEEKS, PLEASE GIVE [place] A CALL AT [phone] X [phone].   THE PATIENT IS ENCOURAGED TO PRACTICE SOCIAL DISTANCING DUE TO THE COVID-19 PANDEMIC.

## 2021-06-06 NOTE — Patient Instructions (Signed)

## 2021-06-07 LAB — HEMOGLOBIN A1C
Est. average glucose Bld gHb Est-mCnc: 120 mg/dL
Hgb A1c MFr Bld: 5.8 % — ABNORMAL HIGH (ref 4.8–5.6)

## 2021-06-07 LAB — CMP14+EGFR
ALT: 12 IU/L (ref 0–32)
AST: 23 IU/L (ref 0–40)
Albumin/Globulin Ratio: 1.6 (ref 1.2–2.2)
Albumin: 4.7 g/dL (ref 3.7–4.7)
Alkaline Phosphatase: 70 IU/L (ref 44–121)
BUN/Creatinine Ratio: 12 (ref 12–28)
BUN: 11 mg/dL (ref 8–27)
Bilirubin Total: 0.3 mg/dL (ref 0.0–1.2)
CO2: 26 mmol/L (ref 20–29)
Calcium: 9.6 mg/dL (ref 8.7–10.3)
Chloride: 107 mmol/L — ABNORMAL HIGH (ref 96–106)
Creatinine, Ser: 0.93 mg/dL (ref 0.57–1.00)
Globulin, Total: 3 g/dL (ref 1.5–4.5)
Glucose: 95 mg/dL (ref 70–99)
Potassium: 4.1 mmol/L (ref 3.5–5.2)
Sodium: 145 mmol/L — ABNORMAL HIGH (ref 134–144)
Total Protein: 7.7 g/dL (ref 6.0–8.5)
eGFR: 64 mL/min/{1.73_m2} (ref >59)

## 2021-06-07 LAB — LIPID PANEL
Chol/HDL Ratio: 3.1 ratio (ref 0.0–4.4)
Cholesterol, Total: 182 mg/dL (ref 100–199)
HDL: 58 mg/dL (ref >39)
LDL Chol Calc (NIH): 108 mg/dL — ABNORMAL HIGH (ref 0–99)
Triglycerides: 87 mg/dL (ref 0–149)
VLDL Cholesterol Cal: 16 mg/dL (ref 5–40)

## 2021-06-12 ENCOUNTER — Ambulatory Visit (INDEPENDENT_AMBULATORY_CARE_PROVIDER_SITE_OTHER): Payer: Medicare PPO

## 2021-06-12 DIAGNOSIS — E1122 Type 2 diabetes mellitus with diabetic chronic kidney disease: Secondary | ICD-10-CM

## 2021-06-12 DIAGNOSIS — I129 Hypertensive chronic kidney disease with stage 1 through stage 4 chronic kidney disease, or unspecified chronic kidney disease: Secondary | ICD-10-CM

## 2021-06-12 DIAGNOSIS — N182 Chronic kidney disease, stage 2 (mild): Secondary | ICD-10-CM

## 2021-06-12 DIAGNOSIS — R413 Other amnesia: Secondary | ICD-10-CM

## 2021-06-12 NOTE — Patient Instructions (Signed)
Social Worker Visit Information  Goals we discussed today:   Goals Addressed             This Visit's Progress    Cognitive Function Enhanced   On track    Timeframe:  Long-Range Goal Priority:  High Start Date:  10.4.22                            Next planned outreach: 2.22.23  Patient Goals/Self-Care Activities patient will:   - Engage with Neurology team -Contact SW as needed prior to next scheduled call                           Obtain a better understanding of health plan benefits   On track    Timeframe:  Long-Range Goal Priority:  Low Start Date:   8.2.22                                       Next planned outreach: 2.22.23  Patient Goals/Self-Care Activities patient will:   - Engage with RN Care Manager -Review mailed resource information -Contact SW as needed prior to next scheduled call         Materials Provided: Yes: verbal and written resource information regarding health plan benefits  The patient verbalized understanding of instructions, educational materials, and care plan provided today and declined offer to receive copy of patient instructions, educational materials, and care plan.   Follow Up Plan: SW will follow up with patient by phone over the next 45 days   Daneen Schick, BSW, CDP Social Worker, Certified Dementia Practitioner [facility] [phone]

## 2021-06-12 NOTE — Chronic Care Management (AMB) (Signed)
Chronic Care Management    Social Work Note  06/12/2021 Name: Mariah Burns MRN: [id number] DOB: Sep 04, 1945  RHEANNA SERGENT is a 76 y.o. year old female who is a primary care patient of Glendale Chard, MD. The CCM team was consulted to assist the patient with chronic disease management and/or care coordination needs related to:  DM II, Hypertensive Nephropathy, Mild Cognitive Impairment .   Engaged with patient by telephone for follow up visit in response to provider referral for social work chronic care management and care coordination services.   Consent to Services:  The patient was given information about Chronic Care Management services, agreed to services, and gave verbal consent prior to initiation of services.  Please see initial visit note for detailed documentation.   Patient agreed to services and consent obtained.   Assessment: Review of patient past medical history, allergies, medications, and health status, including review of relevant consultants reports was performed today as part of a comprehensive evaluation and provision of chronic care management and care coordination services.     SDOH (Social Determinants of Health) assessments and interventions performed:    Advanced Directives Status: Not addressed in this encounter.  CCM Care Plan  Allergies  Allergen Reactions   Fosamax [Alendronate Sodium] Other (See Comments)    REACTION: "CAUSE GI UPSET AND FATIGUE"   Omeprazole Other (See Comments)    Lower ab pain   Motrin [Ibuprofen] Other (See Comments)    GI upset- has to eat prior to taking   Fluvastatin Other (See Comments)    mild memoryproblems of confusion     Outpatient Encounter Medications as of 06/12/2021  Medication Sig   aspirin EC 81 MG tablet Take 81 mg by mouth daily. Swallow whole.   calcium carbonate (TUMS - DOSED IN MG ELEMENTAL CALCIUM) 500 MG chewable tablet Chew 1 tablet by mouth as needed.   CALCIUM PO Take by mouth daily.    Cholecalciferol (VITAMIN D-3 PO) Take 1,000 Units by mouth daily.   Cyanocobalamin (VITAMIN B-12 PO)    famotidine (PEPCID) 40 MG tablet as needed.   felodipine (PLENDIL) 10 MG 24 hr tablet TAKE 1 TABLET BY MOUTH EVERY DAY   fish oil-omega-3 fatty acids 1000 MG capsule Take 2 capsules by mouth daily.   Fluocinolone Acetonide Scalp 0.01 % OIL Apply to affected area of scalp as directed 1-2 x/day as needed for itching   GLUCOSAMINE-CHONDROITIN PO Take 2 tablets by mouth daily.   L-Methylfolate-Algae-B12-B6 (METANX) 3-90.314-2-35 MG CAPS Take 1 capsule by mouth 2 (two) times daily.    Magnesium 250 MG TABS Take by mouth daily. Tues, Thursday, Saturday   Menthol, Topical Analgesic, (BIOFREEZE ROLL-ON EX) Apply 1 application topically as needed (pain in back,neck,thumb). (Patient not taking: Reported on 06/06/2021)   mometasone (NASONEX) 50 MCG/ACT nasal spray Place 2 sprays into the nose daily. As needed (Patient not taking: Reported on 06/06/2021)   nitroGLYCERIN (NITROSTAT) 0.4 MG SL tablet Place 1 tablet (0.4 mg total) under the tongue every 5 (five) minutes as needed for chest pain.   Polyvinyl Alcohol-Povidone PF 1.4-0.6 % SOLN Place 1 drop into both eyes daily as needed (dry eyes).    pravastatin (PRAVACHOL) 40 MG tablet Take 2 tablets (80 mg total) by mouth 3 (three) times a week. Mondays,wednesdays and fridays.   TART CHERRY PO    traZODone (DESYREL) 50 MG tablet Take 1 tablet (50 mg total) by mouth at bedtime. (Patient not taking: Reported on 06/06/2021)   Facility-Administered Encounter  Medications as of 06/12/2021  Medication   0.9 %  sodium chloride infusion    Patient Active Problem List   Diagnosis Date Noted   Chronic diastolic heart failure ([place]) 06/06/2021   Insomnia 03/30/2021   Pure hypercholesterolemia 03/07/2021   Reactive depression 11/19/2020   Dry mouth 11/19/2020   Memory loss 11/19/2020   Secondary and unspecified malignant neoplasm of axilla and upper limb lymph nodes  ([place]) 10/28/2019   Class 1 obesity due to excess calories with serious comorbidity and body mass index (BMI) of 30.0 to 30.9 in adult 10/28/2019   Vitamin D deficiency disease 10/28/2019   Chronic bilateral low back pain without sciatica 10/28/2019   Hip pain, bilateral 10/28/2019   Malignant neoplasm of upper-outer quadrant of right breast in female, estrogen receptor positive ([place]) 08/11/2018   Chronic renal disease, stage II 05/15/2018   Hypertensive nephropathy 05/15/2018   Adult BMI 29.0-29.9 kg/sq m 05/15/2018   Dyspnea on exertion 02/17/2015   Incomplete right bundle branch block 85/27/7824   Systolic murmur 23/53/6144   Pericardial effusion 02/17/2015   Osteopenia 10/05/2011   Type 2 diabetes mellitus with stage 2 chronic kidney disease, without long-term current use of insulin ([place]) 05/06/2010   SARCOIDOSIS 03/21/2007   HYPERLIPIDEMIA 03/21/2007   Obstructive sleep apnea 03/21/2007   Essential hypertension 03/21/2007    Conditions to be addressed/monitored:  DM II, Hypertensive Nephropathy, Mild Cognitive Impairment ; Memory Deficits  Care Plan : Social Work [place]  Updates made by Daneen Schick since 06/12/2021 12:00 AM     Problem: Quality of Life (General Plan of Care)      Long-Range Goal: Obtain a Better Understanding of Health Plan Benefits   Start Date: 01/03/2021  Recent Progress: On track  Priority: Low  Note:   Current Barriers:  Chronic disease management support and education needs related to DM and Hypertensive Nephropathy   Limited knowledge of health plan benefit  Social Worker Clinical Goal(s):  patient will work with SW to identify and address any acute and/or chronic care coordination needs related to the self health management of DM and Hypertensive Nephropathy   Patient will work with SW to become more knowledgeable of health plan benefits  SW Interventions:  Inter-disciplinary care team collaboration (see longitudinal plan of  care) Collaboration with Glendale Chard, MD regarding development and update of comprehensive plan of care as evidenced by provider attestation and co-signature Telephonic visit completed with the patient to assess goal progression Determined the patient is unsure if she received mailed resource and is having a hard time recalling what the [employer] benefit is Discussed plans for SW to e-mail the patient information on what this benefit is and how to access it so she can save it for future reference Patient reports she is receiving a lot of calls from people claiming to be with her health plan asking questions like "do you have diabetes" - encouraged the patient to refrain from sharing personal information over the phone with anyone who she does not know  Patient Goals/Self-Care Activities patient will:   -  Engage with RN Care Manager -Review mailed resource information -Contact SW as needed prior to next scheduled call  Follow Up Plan:  SW will follow up with the patient over the next 45 days     Problem: Cognitive Function      Long-Range Goal: Cognitive Function Enhanced   Start Date: 03/07/2021  Recent Progress: On track  Priority: High  Note:   Current  Barriers:  Chronic disease management support and education needs related to DM and Hypertensive Nephropathy   Cognitive Deficits  Social Worker Clinical Goal(s):  patient will work with SW to identify and address any acute and/or chronic care coordination needs related to the self health management of DM and Hypertensive Nephropathy   Patient will engage with neurologist to address concerns with memory loss  SW Interventions:  Inter-disciplinary care team collaboration (see longitudinal plan of care) Collaboration with Glendale Chard, MD regarding development and update of comprehensive plan of care as evidenced by provider attestation and co-signature Performed chart review to note patient recently diagnosed with Mild Cognitive  Impairment by Dr. Leta Baptist on 12.20 with instructions to follow up with primary care provider for disease management Noted patient not currently on memory enhancing medication and continues to function independently but does note deficits with short term recall  SW to continue to follow to assess for future resource needs   Patient Goals/Self-Care Activities patient will:   -  Engage with Neurology team -Contact SW as needed prior to next scheduled call   Follow Up Plan:  SW will follow up with the patient over the next 45 days       Follow Up Plan: SW will follow up with patient by phone over the next 45 days.      Daneen Schick, BSW, CDP Social Worker, Certified Dementia Practitioner [facility] [phone]

## 2021-06-20 ENCOUNTER — Telehealth: Payer: Medicare PPO

## 2021-06-20 ENCOUNTER — Ambulatory Visit: Payer: Self-pay

## 2021-06-20 DIAGNOSIS — M19079 Primary osteoarthritis, unspecified ankle and foot: Secondary | ICD-10-CM | POA: Diagnosis not present

## 2021-06-20 DIAGNOSIS — B351 Tinea unguium: Secondary | ICD-10-CM | POA: Diagnosis not present

## 2021-06-20 DIAGNOSIS — N182 Chronic kidney disease, stage 2 (mild): Secondary | ICD-10-CM

## 2021-06-20 DIAGNOSIS — L603 Nail dystrophy: Secondary | ICD-10-CM | POA: Diagnosis not present

## 2021-06-20 DIAGNOSIS — E1140 Type 2 diabetes mellitus with diabetic neuropathy, unspecified: Secondary | ICD-10-CM | POA: Diagnosis not present

## 2021-06-20 DIAGNOSIS — E1142 Type 2 diabetes mellitus with diabetic polyneuropathy: Secondary | ICD-10-CM | POA: Diagnosis not present

## 2021-06-20 DIAGNOSIS — I129 Hypertensive chronic kidney disease with stage 1 through stage 4 chronic kidney disease, or unspecified chronic kidney disease: Secondary | ICD-10-CM

## 2021-06-20 DIAGNOSIS — M792 Neuralgia and neuritis, unspecified: Secondary | ICD-10-CM | POA: Diagnosis not present

## 2021-06-20 DIAGNOSIS — E1122 Type 2 diabetes mellitus with diabetic chronic kidney disease: Secondary | ICD-10-CM

## 2021-06-20 DIAGNOSIS — E7800 Pure hypercholesterolemia, unspecified: Secondary | ICD-10-CM

## 2021-06-20 DIAGNOSIS — G3184 Mild cognitive impairment, so stated: Secondary | ICD-10-CM

## 2021-06-21 NOTE — Chronic Care Management (AMB) (Signed)
Chronic Care Management   CCM RN Visit Note  06/20/2021 Name: Mariah Burns MRN: [id number] DOB: 06-09-1945  Subjective: Mariah Burns is a 76 y.o. year old female who is a primary care patient of Glendale Chard, MD. The care management team was consulted for assistance with disease management and care coordination needs.    Engaged with patient by telephone for follow up visit in response to provider referral for case management and/or care coordination services.   Consent to Services:  The patient was given information about Chronic Care Management services, agreed to services, and gave verbal consent prior to initiation of services.  Please see initial visit note for detailed documentation.   Patient agreed to services and verbal consent obtained.   Assessment: Review of patient past medical history, allergies, medications, health status, including review of consultants reports, laboratory and other test data, was performed as part of comprehensive evaluation and provision of chronic care management services.   SDOH (Social Determinants of Health) assessments and interventions performed:  Yes, no acute challenges   CCM Care Plan  Allergies  Allergen Reactions   Fosamax [Alendronate Sodium] Other (See Comments)    REACTION: "CAUSE GI UPSET AND FATIGUE"   Omeprazole Other (See Comments)    Lower ab pain   Motrin [Ibuprofen] Other (See Comments)    GI upset- has to eat prior to taking   Fluvastatin Other (See Comments)    mild memoryproblems of confusion     Outpatient Encounter Medications as of 06/20/2021  Medication Sig   aspirin EC 81 MG tablet Take 81 mg by mouth daily. Swallow whole.   calcium carbonate (TUMS - DOSED IN MG ELEMENTAL CALCIUM) 500 MG chewable tablet Chew 1 tablet by mouth as needed.   CALCIUM PO Take by mouth daily.   Cholecalciferol (VITAMIN D-3 PO) Take 1,000 Units by mouth daily.   Cyanocobalamin (VITAMIN B-12 PO)    famotidine (PEPCID) 40 MG  tablet as needed.   felodipine (PLENDIL) 10 MG 24 hr tablet TAKE 1 TABLET BY MOUTH EVERY DAY   fish oil-omega-3 fatty acids 1000 MG capsule Take 2 capsules by mouth daily.   Fluocinolone Acetonide Scalp 0.01 % OIL Apply to affected area of scalp as directed 1-2 x/day as needed for itching   GLUCOSAMINE-CHONDROITIN PO Take 2 tablets by mouth daily.   L-Methylfolate-Algae-B12-B6 (METANX) 3-90.314-2-35 MG CAPS Take 1 capsule by mouth 2 (two) times daily.    Magnesium 250 MG TABS Take by mouth daily. Tues, Thursday, Saturday   Menthol, Topical Analgesic, (BIOFREEZE ROLL-ON EX) Apply 1 application topically as needed (pain in back,neck,thumb). (Patient not taking: Reported on 06/06/2021)   nitroGLYCERIN (NITROSTAT) 0.4 MG SL tablet Place 1 tablet (0.4 mg total) under the tongue every 5 (five) minutes as needed for chest pain.   Polyvinyl Alcohol-Povidone PF 1.4-0.6 % SOLN Place 1 drop into both eyes daily as needed (dry eyes).    pravastatin (PRAVACHOL) 40 MG tablet Take 2 tablets (80 mg total) by mouth 3 (three) times a week. Mondays,wednesdays and fridays.   TART CHERRY PO    Facility-Administered Encounter Medications as of 06/20/2021  Medication   0.9 %  sodium chloride infusion    Patient Active Problem List   Diagnosis Date Noted   Chronic diastolic heart failure ([place]) 06/06/2021   Insomnia 03/30/2021   Pure hypercholesterolemia 03/07/2021   Reactive depression 11/19/2020   Dry mouth 11/19/2020   Memory loss 11/19/2020   Secondary and unspecified malignant neoplasm of axilla and  upper limb lymph nodes ([place]) 10/28/2019   Class 1 obesity due to excess calories with serious comorbidity and body mass index (BMI) of 30.0 to 30.9 in adult 10/28/2019   Vitamin D deficiency disease 10/28/2019   Chronic bilateral low back pain without sciatica 10/28/2019   Hip pain, bilateral 10/28/2019   Malignant neoplasm of upper-outer quadrant of right breast in female, estrogen receptor positive ([place])  08/11/2018   Chronic renal disease, stage II 05/15/2018   Hypertensive nephropathy 05/15/2018   Adult BMI 29.0-29.9 kg/sq m 05/15/2018   Dyspnea on exertion 02/17/2015   Incomplete right bundle branch block 97/98/9211   Systolic murmur 94/17/4081   Pericardial effusion 02/17/2015   Osteopenia 10/05/2011   Type 2 diabetes mellitus with stage 2 chronic kidney disease, without long-term current use of insulin ([place]) 05/06/2010   SARCOIDOSIS 03/21/2007   HYPERLIPIDEMIA 03/21/2007   Obstructive sleep apnea 03/21/2007   Essential hypertension 03/21/2007    Conditions to be addressed/monitored: DM II, Hypertensive Nephropathy, HLD, Mild Cognitive Impairment   Care Plan : RN Care Manager plan of care  Updates made by Lynne Logan, RN since 06/20/2021 12:00 AM     Problem: No plan of care established for management of chronic disease states (DM II, Hypertensive Nephropathy, HLD, Mild Cognitive Impairment)   Priority: High     Long-Range Goal: Development of plan of care for management of chronic disease management (DM II, Hypertensive Nephropathy, HLD)   Start Date: 05/17/2021  Expected End Date: 05/17/2022  Recent Progress: On track  Priority: High  Note:   Current Barriers:  Knowledge Deficits related to plan of care for management of DM II, Hypertensive Nephropathy, HLD, Mild Cognitive Impairment, Mild Cognitive Impairment  Chronic Disease Management support and education needs related to DM II, Hypertensive Nephropathy, HLD, Mild Cognitive Impairment   RNCM Clinical Goal(s):  Patient will verbalize basic understanding of  DM II, Hypertensive Nephropathy, HLD) disease process and self health management plan as evidenced by patient will report having no disease exacerbations related to her chronic disease states as listed above  demonstrate Improved health management independence as evidenced by patient will report 100% adherence to her prescribed treatment plan  continue to work  with RN Care Manager to address care management and care coordination needs related to  DM II, Hypertensive Nephropathy, HLD, Mild Cognitive Impairment as evidenced by adherence to CM Team Scheduled appointments demonstrate ongoing self health care management ability   as evidenced by    through collaboration with RN Care manager, provider, and care team.   Interventions: 1:1 collaboration with primary care provider regarding development and update of comprehensive plan of care as evidenced by provider attestation and co-signature Inter-disciplinary care team collaboration (see longitudinal plan of care) Evaluation of current treatment plan related to  self management and patient's adherence to plan as established by provider   Chronic Kidney Disease Interventions:  (Status:  New goal.) Long Term Goal Assessed the Patient understanding of chronic kidney disease    Evaluation of current treatment plan related to chronic kidney disease self management and patient's adherence to plan as established by provider      Reviewed prescribed diet increase daily water intake to 64 oz daily Assessed social determinant of health barriers    Provided education on kidney disease progression    Discussed plans with patient for ongoing care management follow up and provided patient with direct contact information for care management team Last practice recorded BP readings:  BP Readings from Last  3 Encounters:  06/06/21 134/86  05/23/21 (!) 168/75  03/30/21 (!) 130/58  Most recent eGFR/CrCl:  Lab Results  Component Value Date   EGFR 64 06/06/2021    No components found for: CRCL  Diabetes Interventions:  (Status:  Goal on track:  Yes.) Long Term Goal Assessed patient's understanding of A1c goal:  <5.7 Provided education to patient about basic DM disease process Reviewed medications with patient and discussed importance of medication adherence Counseled on importance of regular laboratory monitoring as  prescribed Review of patient status, including review of consultants reports, relevant laboratory and other test results, and medications completed Educated on dietary and exercise recommendations  Lab Results  Component Value Date   HGBA1C 5.8 (H) 06/06/2021   Mild Cognitive Impairment:  (Status:  New goal.)  Long Term Goal Evaluation of current treatment plan related to Mild Cognitive Impairment Reviewed and discussed Neuro evaluation and recommendations, patient verbalizes understanding regarding her diagnosis of Mild Cognitive Impairment Educated on safety and awareness, Educated on importance of patient limiting driving when possible due to patient reports having 1 episode of "getting lost" in a familiar neighborhood Determined patient's youngest daughter is living with her and is driving her or riding along to all destinations Educated on the use of a reminder system such as a calendar to help patient keep up with important events and appointments Discussed importance of engagement in brain stimulating activities to help slow progression of disease and maintain and or improve current cognitive level Educated patient on pharmacological treatment options prescribed for Dementia and encouraged patient/family to keep PCP well informed of new or worsening symptoms related to this condition Mailed printed educational materials related to the MIND diet  Discussed plans with patient for ongoing care management follow up and provided patient with direct contact information for care management team   Hyperlipidemia Interventions:  (Status:  New goal.) Long Term Goal Medication review performed; medication list updated in electronic medical record.  Provider established cholesterol goals reviewed Counseled on importance of regular laboratory monitoring as prescribed Reviewed role and benefits of statin for ASCVD risk reduction Reviewed importance of limiting foods high in cholesterol Reviewed  exercise goals and target of 150 minutes per week Collaborated with PCP regarding medication dosage/frequency; per Dr. Baird Cancer patient should increase her Pravastatin 80 mg M-F, hold on weekends, patient advised and verbalizes understanding    Determined patient is self administering her medications, she denies missing doses, she is using a pill box and has established a daily routine Discussed PCP would like to order a home health SNV for medication review and patient is agreeable, referral to be sent by PCP Mailed printed educational materials related to Cooking to Lower Cholesterol; The Skinny on Fats  Discussed plans with patient for ongoing care management follow up and provided patient with direct contact information for care management team  Patient Goals/Self-Care Activities: Take all medications as prescribed Attend all scheduled provider appointments Call pharmacy for medication refills 3-7 days in advance of running out of medications Perform all self care activities independently  Perform IADL's (shopping, preparing meals, housekeeping, managing finances) independently Call provider office for new concerns or questions  drink 6 to 8 glasses of water each day fill half of plate with vegetables manage portion size adhere to prescribed diet: low trans/saturated fat develop an exercise routine  Follow Up Plan:  Telephone follow up appointment with care management team member scheduled for:  08/18/21      Plan:Telephone follow up appointment with care  management team member scheduled for:  08/18/21  Barb Merino, RN, BSN, CCM Care Management Coordinator [facility]  Direct Phone: [phone]

## 2021-06-21 NOTE — Patient Instructions (Addendum)
Visit Information  Thank you for taking time to visit with me today. Please don't hesitate to contact me if I can be of assistance to you before our next scheduled telephone appointment.  Following are the goals we discussed today:  (Copy and paste patient goals from clinical care plan here)  Our next appointment is by telephone on 08/18/21 at 12:35 PM  Please call the care guide team at [phone] if you need to cancel or reschedule your appointment.   If you are experiencing a Mental Health or [place] or need someone to talk to, please call 1-800-273-TALK (toll free, 24 hour hotline)   The patient verbalized understanding of instructions, educational materials, and care plan provided today and agreed to receive a mailed copy of patient instructions, educational materials, and care plan.   Barb Merino, RN, BSN, CCM Care Management Coordinator [place] [facility]  Direct Phone: [phone]

## 2021-06-27 ENCOUNTER — Other Ambulatory Visit: Payer: Self-pay

## 2021-06-27 ENCOUNTER — Other Ambulatory Visit: Payer: Self-pay | Admitting: Internal Medicine

## 2021-06-27 ENCOUNTER — Ambulatory Visit: Payer: Medicare PPO

## 2021-06-27 VITALS — BP 136/70 | HR 65 | Temp 98.0°F | Ht 59.0 in | Wt 149.0 lb

## 2021-06-27 DIAGNOSIS — G3184 Mild cognitive impairment, so stated: Secondary | ICD-10-CM

## 2021-06-27 DIAGNOSIS — I129 Hypertensive chronic kidney disease with stage 1 through stage 4 chronic kidney disease, or unspecified chronic kidney disease: Secondary | ICD-10-CM

## 2021-06-27 DIAGNOSIS — R413 Other amnesia: Secondary | ICD-10-CM

## 2021-06-27 NOTE — Patient Instructions (Signed)

## 2021-06-27 NOTE — Progress Notes (Signed)
Patient presents today for BPC.  BP Readings from Last 3 Encounters:  06/27/21 136/70  06/06/21 134/86  05/23/21 (!) 168/75   Her blood pressure with her personal cuff is 146/63.   She currently take plendil 10mg  once daily.   Patient is instructed to cut back on  salt. if higher than 130/80 next visit I will add another medication.

## 2021-07-03 DIAGNOSIS — E785 Hyperlipidemia, unspecified: Secondary | ICD-10-CM | POA: Diagnosis not present

## 2021-07-03 DIAGNOSIS — G3184 Mild cognitive impairment, so stated: Secondary | ICD-10-CM | POA: Diagnosis not present

## 2021-07-03 DIAGNOSIS — H269 Unspecified cataract: Secondary | ICD-10-CM | POA: Diagnosis not present

## 2021-07-03 DIAGNOSIS — E669 Obesity, unspecified: Secondary | ICD-10-CM | POA: Diagnosis not present

## 2021-07-03 DIAGNOSIS — Z6830 Body mass index (BMI) 30.0-30.9, adult: Secondary | ICD-10-CM | POA: Diagnosis not present

## 2021-07-03 DIAGNOSIS — I10 Essential (primary) hypertension: Secondary | ICD-10-CM | POA: Diagnosis not present

## 2021-07-03 DIAGNOSIS — M1990 Unspecified osteoarthritis, unspecified site: Secondary | ICD-10-CM | POA: Diagnosis not present

## 2021-07-03 DIAGNOSIS — G4733 Obstructive sleep apnea (adult) (pediatric): Secondary | ICD-10-CM | POA: Diagnosis not present

## 2021-07-03 DIAGNOSIS — Z7722 Contact with and (suspected) exposure to environmental tobacco smoke (acute) (chronic): Secondary | ICD-10-CM | POA: Diagnosis not present

## 2021-07-04 DIAGNOSIS — E7800 Pure hypercholesterolemia, unspecified: Secondary | ICD-10-CM | POA: Diagnosis not present

## 2021-07-04 DIAGNOSIS — I129 Hypertensive chronic kidney disease with stage 1 through stage 4 chronic kidney disease, or unspecified chronic kidney disease: Secondary | ICD-10-CM | POA: Diagnosis not present

## 2021-07-04 DIAGNOSIS — E1122 Type 2 diabetes mellitus with diabetic chronic kidney disease: Secondary | ICD-10-CM

## 2021-07-04 DIAGNOSIS — N182 Chronic kidney disease, stage 2 (mild): Secondary | ICD-10-CM

## 2021-07-07 ENCOUNTER — Emergency Department (HOSPITAL_COMMUNITY)
Admission: EM | Admit: 2021-07-07 | Discharge: 2021-07-08 | Disposition: A | Discharge status: Home / Self Care | Payer: Medicare PPO | Attending: Emergency Medicine | Admitting: Emergency Medicine

## 2021-07-07 ENCOUNTER — Other Ambulatory Visit: Payer: Self-pay

## 2021-07-07 ENCOUNTER — Encounter (HOSPITAL_COMMUNITY): Payer: Self-pay

## 2021-07-07 ENCOUNTER — Emergency Department (HOSPITAL_COMMUNITY): Payer: Medicare PPO

## 2021-07-07 DIAGNOSIS — I10 Essential (primary) hypertension: Secondary | ICD-10-CM | POA: Insufficient documentation

## 2021-07-07 DIAGNOSIS — Z7982 Long term (current) use of aspirin: Secondary | ICD-10-CM | POA: Insufficient documentation

## 2021-07-07 DIAGNOSIS — E119 Type 2 diabetes mellitus without complications: Secondary | ICD-10-CM | POA: Diagnosis not present

## 2021-07-07 DIAGNOSIS — R0789 Other chest pain: Secondary | ICD-10-CM | POA: Diagnosis not present

## 2021-07-07 DIAGNOSIS — R079 Chest pain, unspecified: Secondary | ICD-10-CM | POA: Diagnosis not present

## 2021-07-07 LAB — BASIC METABOLIC PANEL
Anion gap: 8 (ref 5–15)
BUN: 17 mg/dL (ref 8–23)
CO2: 26 mmol/L (ref 22–32)
Calcium: 9.5 mg/dL (ref 8.9–10.3)
Chloride: 105 mmol/L (ref 98–111)
Creatinine, Ser: 0.79 mg/dL (ref 0.44–1.00)
GFR, Estimated: >60 mL/min (ref >60)
Glucose, Bld: 109 mg/dL — ABNORMAL HIGH (ref 70–99)
Potassium: 3.6 mmol/L (ref 3.5–5.1)
Sodium: 139 mmol/L (ref 135–145)

## 2021-07-07 LAB — TROPONIN I (HIGH SENSITIVITY): Troponin I (High Sensitivity): 6 ng/L (ref <18)

## 2021-07-07 LAB — CBC
HCT: 38.6 % (ref 36.0–46.0)
Hemoglobin: 12.3 g/dL (ref 12.0–15.0)
MCH: 28.1 pg (ref 26.0–34.0)
MCHC: 31.9 g/dL (ref 30.0–36.0)
MCV: 88.1 fL (ref 80.0–100.0)
Platelets: 192 10*3/uL (ref 150–400)
RBC: 4.38 MIL/uL (ref 3.87–5.11)
RDW: 13.8 % (ref 11.5–15.5)
WBC: 4.4 10*3/uL (ref 4.0–10.5)
nRBC: 0 % (ref 0.0–0.2)

## 2021-07-07 MED ORDER — PRAVASTATIN SODIUM 80 MG PO TABS: 80.0000 mg | ORAL_TABLET | Freq: Every evening | ORAL | 3 refills | Status: DC

## 2021-07-07 NOTE — ED Provider Triage Note (Signed)
Emergency Medicine Provider Triage Evaluation Note  Mariah Burns , a 76 y.o. female  was evaluated in triage.  Pt complains of chest pain.  Patient had 2 episode of chest pain this morning.  Patient states that this chest pain is nonexertional, does not radiate.  Patient denies any cardiac history.  Review of Systems  Positive: Chest pain Negative: Shortness of breath, abdominal pain, lower extremity swelling  Physical Exam  BP (!) 179/70 (BP Location: Left Arm)    Pulse 64    Temp 98.7 F (37.1 C) (Oral)    Resp 16    Ht 4\' 10"  (1.473 m)    Wt 67.1 kg    SpO2 98%    BMI 30.93 kg/m  Gen:   Awake, no distress   Resp:  Normal effort  MSK:   Moves extremities without difficulty  Other:  No lower extremity swelling. Lungs CTAB  Medical Decision Making  Medically screening exam initiated at 9:31 PM.  Appropriate orders placed.  [name] was informed that the remainder of the evaluation will be completed by another provider, this initial triage assessment does not replace that evaluation, and the importance of remaining in the ED until their evaluation is complete.     Azucena Cecil, PA-C 07/07/21 2134

## 2021-07-07 NOTE — ED Triage Notes (Signed)
Pt reports with chest pain since today. Pt states that it is middle chest.

## 2021-07-07 NOTE — Discharge Instructions (Addendum)

## 2021-07-07 NOTE — ED Provider Notes (Signed)
[facility] DEPT Provider Note   CSN: [id number] Arrival date & time: 07/07/21  2102     History  Chief Complaint  Patient presents with   Chest Pain    Mariah Burns is a 76 y.o. female.   Chest Pain Associated symptoms: no fever and no shortness of breath    HPI: A 76 year old patient with a history of treated diabetes, hypertension and hypercholesterolemia presents for evaluation of chest pain. Initial onset of pain was more than 6 hours ago. The patient's chest pain is sharp and is not worse with exertion. The patient's chest pain is middle- or left-sided, is not well-localized, is not described as heaviness/pressure/tightness and does not radiate to the arms/jaw/neck. The patient does not complain of nausea and denies diaphoresis. The patient has no history of stroke, has no history of peripheral artery disease, has not smoked in the past 90 days, has no relevant family history of coronary artery disease (first degree relative at less than age 50) and does not have an elevated BMI (>=30).  Patient had 2 episodes today.  Each lasting just a few minutes.  Patient is pain-free Home Medications Prior to Admission medications   Medication Sig Start Date End Date Taking? Authorizing Provider  aspirin EC 81 MG tablet Take 81 mg by mouth daily. Swallow whole.    [provider]  calcium carbonate (TUMS - DOSED IN MG ELEMENTAL CALCIUM) 500 MG chewable tablet Chew 1 tablet by mouth as needed.    [provider]  CALCIUM PO Take by mouth daily.    [provider]  Cholecalciferol (VITAMIN D-3 PO) Take 1,000 Units by mouth daily.    [provider]  Cyanocobalamin (VITAMIN B-12 PO)     [provider]  famotidine (PEPCID) 40 MG tablet as needed. 11/16/19   [provider]  felodipine (PLENDIL) 10 MG 24 hr tablet TAKE 1 TABLET BY MOUTH EVERY DAY 03/22/21   Glendale Chard, MD  fish oil-omega-3 fatty acids  1000 MG capsule Take 2 capsules by mouth daily.    [provider]  Fluocinolone Acetonide Scalp 0.01 % OIL Apply to affected area of scalp as directed 1-2 x/day as needed for itching 06/11/19   Glendale Chard, MD  GLUCOSAMINE-CHONDROITIN PO Take 2 tablets by mouth daily.    [provider]  L-Methylfolate-Algae-B12-B6 Glade [place]) 3-90.314-2-35 MG CAPS Take 1 capsule by mouth 2 (two) times daily.     [provider]  Magnesium 250 MG TABS Take by mouth daily. Tues, Thursday, Saturday    [provider]  Menthol, Topical Analgesic, (BIOFREEZE ROLL-ON EX) Apply 1 application topically as needed (pain in back,neck,thumb). Patient not taking: Reported on 06/06/2021    [provider]  nitroGLYCERIN (NITROSTAT) 0.4 MG SL tablet Place 1 tablet (0.4 mg total) under the tongue every 5 (five) minutes as needed for chest pain. 06/06/16   Isaiah Serge, NP  Polyvinyl Alcohol-Povidone PF 1.4-0.6 % SOLN Place 1 drop into both eyes daily as needed (dry eyes).     [provider]  pravastatin (PRAVACHOL) 80 MG tablet Take 1 tablet (80 mg total) by mouth every evening. Take one tablet by mouth on Monday Wednesday and Friday. 07/07/21 07/07/22  Glendale Chard, MD  TART CHERRY PO     [provider]      Allergies    Fosamax [alendronate sodium], Omeprazole, Motrin [ibuprofen], and Fluvastatin    Review of Systems   Review of Systems  Constitutional:  Negative for fever.  Respiratory:  Negative for shortness of breath.   Cardiovascular:  Positive for chest pain.   Physical Exam Updated Vital Signs BP (!) 179/70 (BP Location: Left Arm)    Pulse 64    Temp 98.7 F (37.1 C) (Oral)    Resp 16    Ht 1.473 m (4\' 10" )    Wt 67.1 kg    SpO2 98%    BMI 30.93 kg/m  Physical Exam Vitals and nursing note reviewed.  Constitutional:      General: She is not in acute distress.    Appearance: She is well-developed.  HENT:     Head: Normocephalic and atraumatic.      Right Ear: External ear normal.     Left Ear: External ear normal.  Eyes:     General: No scleral icterus.       Right eye: No discharge.        Left eye: No discharge.     Conjunctiva/sclera: Conjunctivae normal.  Neck:     Trachea: No tracheal deviation.  Cardiovascular:     Rate and Rhythm: Normal rate and regular rhythm.  Pulmonary:     Effort: Pulmonary effort is normal. No respiratory distress.     Breath sounds: Normal breath sounds. No stridor. No wheezing or rales.  Abdominal:     General: Bowel sounds are normal. There is no distension.     Palpations: Abdomen is soft.     Tenderness: There is no abdominal tenderness. There is no guarding or rebound.  Musculoskeletal:        General: No tenderness or deformity.     Cervical back: Neck supple.  Skin:    General: Skin is warm and dry.     Findings: No rash.  Neurological:     General: No focal deficit present.     Mental Status: She is alert.     Cranial Nerves: No cranial nerve deficit (no facial droop, extraocular movements intact, no slurred speech).     Sensory: No sensory deficit.     Motor: No abnormal muscle tone or seizure activity.     Coordination: Coordination normal.  Psychiatric:        Mood and Affect: Mood normal.    ED Results / Procedures / Treatments   Labs (all labs ordered are listed, but only abnormal results are displayed) Labs Reviewed  BASIC METABOLIC PANEL - Abnormal; Notable for the following components:      Result Value   Glucose, Bld 109 (*)    All other components within normal limits  CBC  TROPONIN I (HIGH SENSITIVITY)  TROPONIN I (HIGH SENSITIVITY)    EKG EKG Interpretation  Date/Time:  Friday July 07 2021 21:25:08 EST Ventricular Rate:  65 PR Interval:  145 QRS Duration: 130 QT Interval:  433 QTC Calculation: 451 R Axis:   27 Text Interpretation: Sinus rhythm Right bundle branch block No significant change since last tracing Confirmed by Dorie Rank [phone]) on  07/07/2021 9:31:02 PM  Radiology DG Chest 2 View  Result Date: 07/07/2021 CLINICAL DATA:  Mid chest pain today. EXAM: CHEST - 2 VIEW COMPARISON:  02/24/2021 FINDINGS: Heart size and pulmonary vascularity are normal. Lungs are clear. No pleural effusions. No pneumothorax. 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 This is unchanged since prior study. Degenerative changes in the spine. IMPRESSION: No evidence of active pulmonary disease. No change since  previous study. Electronically Signed   By: Lucienne Capers M.D.   On: 07/07/2021 22:05    Procedures Procedures    Medications Ordered in ED Medications - No data to display  ED Course/ Medical Decision Making/ A&P   HEAR Score: 5                       Medical Decision Making Amount and/or Complexity of Data Reviewed Labs: ordered. Radiology: ordered.  Patient presented to ED with complaints of chest pain.  Moderate risk heart score.  Initial troponin normal.  Plan on checking a delta troponin.  Overall symptoms are atypical for acute coronary syndrome.  Patient has not had any shortness of breath.  Doubt pulmonary embolism.  Hypertension noted but I doubt this is causing any of her symptoms.  Will be appropriate for outpatient follow-up.  Evaluation and diagnostic testing in the emergency department does not suggest an emergent condition requiring admission or immediate intervention beyond what has been performed at this time.  The patient is safe for discharge and has been instructed to return immediately for worsening symptoms, change in symptoms or any other concerns.  Delta trop pending at shift change.  If normal anticipate discharge with outpatient follow up.  Care turned over to Dr Christy Gentles        Final Clinical Impression(s) / ED Diagnoses Final diagnoses:  Nonspecific chest pain  Hypertension, unspecified type    Rx / DC Orders ED  Discharge Orders     None         Dorie Rank, MD 07/07/21 2353

## 2021-07-08 LAB — TROPONIN I (HIGH SENSITIVITY): Troponin I (High Sensitivity): 8 ng/L (ref <18)

## 2021-07-08 NOTE — ED Notes (Signed)
Pt ambulatory to restroom w/out assistance. Gait steady. Pt instructed to use call cord in restroom if assistance is needed. Pt verbalized understanding.

## 2021-07-08 NOTE — ED Provider Notes (Signed)
I assumed care at signout to follow-up on labs.  Repeat troponin is negative Patient reports she is pain-free.  She is in no acute distress.  She describes brief sharp chest pain that has resolved. She would like to be discharged home.  We discussed strict ER return precautions.  I have reviewed labs and EKG   Ripley Fraise, MD 07/08/21 [phone]

## 2021-07-10 ENCOUNTER — Telehealth: Payer: Self-pay

## 2021-07-10 NOTE — Telephone Encounter (Signed)
Error

## 2021-07-12 ENCOUNTER — Telehealth: Payer: Self-pay

## 2021-07-12 NOTE — Telephone Encounter (Signed)
Transition Care Management Follow-up Telephone Call Date of discharge and from where: 07/08/2021 [facility]  How have you been since you were released from the hospital? Pt states she feels okay, she has only had one episode, chest pain still.  Any questions or concerns? Yes  Items Reviewed: Did the pt receive and understand the discharge instructions provided? Yes  Medications obtained and verified? Yes  Other? Yes  Any new allergies since your discharge? No  Dietary orders reviewed? Yes Do you have support at home? Yes   Home Care and Equipment/Supplies: Were home health services ordered? not applicable If so, what is the name of the agency? N/a  Has the agency set up a time to come to the patient's home? not applicable Were any new equipment or medical supplies ordered?  No What is the name of the medical supply agency? N/a Were you able to get the supplies/equipment? not applicable Do you have any questions related to the use of the equipment or supplies? No  Functional Questionnaire: (I = Independent and D = Dependent) ADLs: i  Bathing/Dressing- i  Meal Prep- i  Eating- i  Maintaining continence- i  Transferring/Ambulation- i  Managing Meds- i  Follow up appointments reviewed:  PCP Hospital f/u appt confirmed? No  Scheduled to see n/a on n/a @ n/a. [facility] f/u appt confirmed? No  Scheduled to see n/a  on n/a  @ n/a. Are transportation arrangements needed? No  If their condition worsens, is the pt aware to call PCP or go to the Emergency Dept.? Yes Was the patient provided with contact information for the PCP's office or ED? Yes Was to pt encouraged to call back with questions or concerns? Yes

## 2021-07-26 ENCOUNTER — Ambulatory Visit (INDEPENDENT_AMBULATORY_CARE_PROVIDER_SITE_OTHER): Payer: Medicare PPO

## 2021-07-26 DIAGNOSIS — G3184 Mild cognitive impairment, so stated: Secondary | ICD-10-CM

## 2021-07-26 DIAGNOSIS — I129 Hypertensive chronic kidney disease with stage 1 through stage 4 chronic kidney disease, or unspecified chronic kidney disease: Secondary | ICD-10-CM

## 2021-07-26 DIAGNOSIS — E1122 Type 2 diabetes mellitus with diabetic chronic kidney disease: Secondary | ICD-10-CM

## 2021-07-26 DIAGNOSIS — E1142 Type 2 diabetes mellitus with diabetic polyneuropathy: Secondary | ICD-10-CM | POA: Diagnosis not present

## 2021-07-26 DIAGNOSIS — N182 Chronic kidney disease, stage 2 (mild): Secondary | ICD-10-CM

## 2021-07-26 DIAGNOSIS — B351 Tinea unguium: Secondary | ICD-10-CM | POA: Diagnosis not present

## 2021-07-26 DIAGNOSIS — M792 Neuralgia and neuritis, unspecified: Secondary | ICD-10-CM | POA: Diagnosis not present

## 2021-07-26 DIAGNOSIS — G5762 Lesion of plantar nerve, left lower limb: Secondary | ICD-10-CM | POA: Diagnosis not present

## 2021-07-26 DIAGNOSIS — M19079 Primary osteoarthritis, unspecified ankle and foot: Secondary | ICD-10-CM | POA: Diagnosis not present

## 2021-07-26 DIAGNOSIS — L603 Nail dystrophy: Secondary | ICD-10-CM | POA: Diagnosis not present

## 2021-07-26 NOTE — Chronic Care Management (AMB) (Signed)
Chronic Care Management    Social Work Note  07/26/2021 Name: Mariah Burns MRN: [id number] DOB: Mar 14, 1946  Mariah Burns is a 76 y.o. year old female who is a primary care patient of Glendale Chard, MD. The CCM team was consulted to assist the patient with chronic disease management and/or care coordination needs related to:  DM II, Hypertensive Nephropathy .   Engaged with patient by telephone for follow up visit in response to provider referral for social work chronic care management and care coordination services.   Consent to Services:  The patient was given information about Chronic Care Management services, agreed to services, and gave verbal consent prior to initiation of services.  Please see initial visit note for detailed documentation.   Patient agreed to services and consent obtained.   Assessment: Review of patient past medical history, allergies, medications, and health status, including review of relevant consultants reports was performed today as part of a comprehensive evaluation and provision of chronic care management and care coordination services.     SDOH (Social Determinants of Health) assessments and interventions performed:    Advanced Directives Status: Not addressed in this encounter.  CCM Care Plan  Allergies  Allergen Reactions   Fosamax [Alendronate Sodium] Other (See Comments)    REACTION: "CAUSE GI UPSET AND FATIGUE"   Omeprazole Other (See Comments)    Lower ab pain   Motrin [Ibuprofen] Other (See Comments)    GI upset- has to eat prior to taking   Fluvastatin Other (See Comments)    mild memoryproblems of confusion     Outpatient Encounter Medications as of 07/26/2021  Medication Sig   aspirin EC 81 MG tablet Take 81 mg by mouth daily. Swallow whole.   calcium carbonate (TUMS - DOSED IN MG ELEMENTAL CALCIUM) 500 MG chewable tablet Chew 1 tablet by mouth as needed.   CALCIUM PO Take by mouth daily.   Cholecalciferol (VITAMIN D-3 PO)  Take 1,000 Units by mouth daily.   Cyanocobalamin (VITAMIN B-12 PO)    famotidine (PEPCID) 40 MG tablet as needed.   felodipine (PLENDIL) 10 MG 24 hr tablet TAKE 1 TABLET BY MOUTH EVERY DAY   fish oil-omega-3 fatty acids 1000 MG capsule Take 2 capsules by mouth daily.   Fluocinolone Acetonide Scalp 0.01 % OIL Apply to affected area of scalp as directed 1-2 x/day as needed for itching   GLUCOSAMINE-CHONDROITIN PO Take 2 tablets by mouth daily.   L-Methylfolate-Algae-B12-B6 (METANX) 3-90.314-2-35 MG CAPS Take 1 capsule by mouth 2 (two) times daily.    Magnesium 250 MG TABS Take by mouth daily. Tues, Thursday, Saturday   Menthol, Topical Analgesic, (BIOFREEZE ROLL-ON EX) Apply 1 application topically as needed (pain in back,neck,thumb). (Patient not taking: Reported on 06/06/2021)   nitroGLYCERIN (NITROSTAT) 0.4 MG SL tablet Place 1 tablet (0.4 mg total) under the tongue every 5 (five) minutes as needed for chest pain.   Polyvinyl Alcohol-Povidone PF 1.4-0.6 % SOLN Place 1 drop into both eyes daily as needed (dry eyes).    pravastatin (PRAVACHOL) 80 MG tablet Take 1 tablet (80 mg total) by mouth every evening. Take one tablet by mouth on Monday Wednesday and Friday.   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/28/2019   Class 1 obesity due to excess calories with serious comorbidity and body mass index (BMI) of 30.0 to 30.9 in adult 10/28/2019   Vitamin D deficiency disease 10/28/2019   Chronic bilateral low back pain without sciatica 10/28/2019   Hip pain, bilateral  10/28/2019   Malignant neoplasm of upper-outer quadrant of right breast in female, estrogen receptor positive ([place]) 08/11/2018   Chronic renal disease, stage II 05/15/2018   Hypertensive nephropathy 05/15/2018   Adult BMI 29.0-29.9 kg/sq m 05/15/2018   Dyspnea on exertion 02/17/2015   Incomplete right bundle branch block 45/80/9983   Systolic murmur 38/25/0539   Pericardial effusion 02/17/2015   Osteopenia 10/05/2011   Type 2 diabetes mellitus with stage 2 chronic kidney disease, without long-term current use of insulin ([place]) 05/06/2010   SARCOIDOSIS 03/21/2007   HYPERLIPIDEMIA 03/21/2007   Obstructive sleep apnea 03/21/2007   Essential hypertension 03/21/2007    Conditions to be addressed/monitored: DMII and Hypertensive Nephropathy ; Cognitive Deficits  Care Plan : Social Work [facility] Care Plan  Updates made by Daneen Schick since 07/26/2021 12:00 AM  Completed 07/26/2021   Problem: Quality of Life (General Plan of Care) Resolved 07/26/2021     Long-Range Goal: Obtain a Better Understanding of Health Plan Benefits Completed 07/26/2021  Start Date: 01/03/2021  Recent Progress: On track  Priority: Low  Note:   Current Barriers:  Chronic disease management support and education needs related to DM and Hypertensive Nephropathy   Limited knowledge of health plan benefit  Social Worker Clinical Goal(s):  patient will work with SW to identify and address any acute and/or chronic care coordination needs related to the self health management of DM and Hypertensive Nephropathy   Patient will work with SW to become more knowledgeable of health plan benefits  SW Interventions:  Inter-disciplinary care team collaboration (see longitudinal plan of care) Collaboration with Glendale Chard, MD regarding development and update of comprehensive plan of care as evidenced by provider attestation and co-signature Telephonic visit completed with the patient to assess care coordination needs Confirmed  receipt of [employer] benefit information Reviewed health plan benefit with the patient and encouraged the patient to contact SW as needed  Patient Goals/Self-Care Activities patient will:  -Engage with health plan to record [employer] activities       Problem: Cognitive Function Resolved 07/26/2021     Long-Range Goal: Cognitive Function Enhanced Completed 07/26/2021  Start Date: 03/07/2021  Recent Progress: On track  Priority: High  Note:   Current Barriers:  Chronic disease management support and education needs related to DM and Hypertensive Nephropathy   Cognitive Deficits  Social Worker Clinical Goal(s):  patient will work with SW to identify and address any acute and/or chronic care coordination needs related to the self health management of DM and Hypertensive Nephropathy   Patient will engage with neurologist to address concerns with memory loss  SW Interventions:  Inter-disciplinary care team collaboration (see longitudinal plan of care) Collaboration with Glendale Chard, MD regarding development and update of comprehensive plan of care as evidenced by provider attestation and co-signature Telephonic visit completed with the patient to assess care coordination needs Patient reports she is doing well and is not concerned with cognitive declines at this time Reviewed upcomming appointments with the patient and encouraged the patient to contact SW as needed  Patient Goals/Self-Care  Activities patient will:   -  Engage with Neurology team as needed to address cognitive function         Follow Up Plan:  No SW follow up planned at this time. The patient will remain engaged with RN Care Manager to address care management needs.      Daneen Schick, BSW, CDP Social Worker, Certified Dementia Practitioner [place] [facility] [place] [facility] [phone]

## 2021-07-26 NOTE — Patient Instructions (Signed)
Social Worker Visit Information  Goals we discussed today:   Goals Addressed             This Visit's Progress    COMPLETED: Cognitive Function Enhanced       Timeframe:  Long-Range Goal Priority:  High Start Date:  10.4.22                            Patient Goals/Self-Care Activities patient will:   - Engage with Neurology team as needed to address cognitive function                          COMPLETED: Obtain a better understanding of health plan benefits       Timeframe:  Long-Range Goal Priority:  Low Start Date:   8.2.22                                       Patient Goals/Self-Care Activities patient will:  -Engage with health plan to record [phone] activities          Materials Provided: No: Patient declined  The patient verbalized understanding of instructions, educational materials, and care plan provided today and declined offer to receive copy of patient instructions, educational materials, and care plan.   Follow Up Plan:  No SW follow up planned at this time. Please contact me as needed.   Daneen Schick, BSW, CDP Social Worker, Certified Dementia Practitioner [place] [facility] [place] [facility] [phone]

## 2021-08-01 DIAGNOSIS — N182 Chronic kidney disease, stage 2 (mild): Secondary | ICD-10-CM

## 2021-08-01 DIAGNOSIS — E1122 Type 2 diabetes mellitus with diabetic chronic kidney disease: Secondary | ICD-10-CM

## 2021-08-01 DIAGNOSIS — I129 Hypertensive chronic kidney disease with stage 1 through stage 4 chronic kidney disease, or unspecified chronic kidney disease: Secondary | ICD-10-CM

## 2021-08-03 ENCOUNTER — Telehealth: Payer: Self-pay

## 2021-08-03 NOTE — Chronic Care Management (AMB) (Signed)
?  Mariah Burns was reminded to have all medications, supplements and any blood glucose and blood pressure readings available for review with [name], Pharm. D, at her telephone visit on 08-08-2021 at 12:00. ? ?Questions: ?Have you had any recent office visit or specialist visit outside of [place]? Patient stated no ? ?Are there any concerns you would like to discuss during your office visit? Patient stated she has an area under left breast that has appeared but isn't bothering her. ? ?Are you having any problems obtaining your medications? (Whether it pharmacy issues or cost) Patient stated no ? ?If patient has any PAP medications ask if they are having any problems getting their PAP medication or refill? No PAP medication ? ?Care Gaps: ?Shingrix overdue ?Covid booster overdue ?Yearly ophthalmology overdue ? ?Star Rating Drug: ?Pravastatin 80 mg- Last filled 02-03-203 [facility]. ? ?Any gaps in medications fill history? No ? ?Malecca Hicks CMA ?Clinical Pharmacist Assistant ?[phone] ? ? ?

## 2021-08-08 ENCOUNTER — Telehealth: Payer: Medicare PPO

## 2021-08-08 NOTE — Progress Notes (Unsigned)
Current Barriers:  {pharmacybarriers:24917}  Pharmacist Clinical Goal(s):  Patient will {PHARMACYGOALCHOICES:24921} through collaboration with PharmD and provider.   Interventions: 1:1 collaboration with Glendale Chard, MD regarding development and update of comprehensive plan of care as evidenced by provider attestation and co-signature Inter-disciplinary care team collaboration (see longitudinal plan of care) Comprehensive medication review performed; medication list updated in electronic medical record  {CCM [facility] DISEASE STATES:25130}  Patient Goals/Self-Care Activities Patient will:  - {pharmacypatientgoals:24919}  Follow Up Plan: {CM FOLLOW UP WGNF:62130}  Chronic Care Management Pharmacy Note  08/08/2021 Name:  Mariah Burns MRN:  [id number] DOB:  77-08-1945  Summary: ***  Recommendations/Changes made from today's visit: ***  Plan: ***   Subjective: Mariah Burns is an 76 y.o. year old female who is a primary patient of Glendale Chard, MD.  The CCM team was consulted for assistance with disease management and care coordination needs.    {CCMTELEPHONEFACETOFACE:21091510} for {CCMINITIALFOLLOWUPCHOICE:21091511} in response to provider referral for pharmacy case management and/or care coordination services.   Consent to Services:  {CCMCONSENTOPTIONS:25074}  Patient Care Team: Glendale Chard, MD as PCP - General (Internal Medicine) Belva Crome, MD as PCP - Cardiology (Cardiology) Mauro Kaufmann, RN as Oncology Nurse Navigator Rockwell Germany, RN as Oncology Nurse Navigator Excell Seltzer, MD (Inactive) as Consulting Physician (General Surgery) Truitt Merle, MD as Consulting Physician (Hematology) Gery Pray, MD as Consulting Physician (Radiation Oncology) Alla Feeling, NP as Nurse Practitioner (Nurse Practitioner) Warren Danes, PA-C as Physician Assistant (Dermatology) Lynne Logan, RN as [place]  Management Jong Rickman, Sharyn Blitz, [facility] (Pharmacist)  Recent office visits: ***  Recent consult visits: [facility] visits: {Hospital DC Yes/No:25215}   Objective:  Lab Results  Component Value Date   CREATININE 0.79 07/07/2021   BUN 17 07/07/2021   EGFR 64 06/06/2021   GFRNONAA >60 07/07/2021   GFRAA 85 05/26/2020   NA 139 07/07/2021   K 3.6 07/07/2021   CALCIUM 9.5 07/07/2021   CO2 26 07/07/2021   GLUCOSE 109 (H) 07/07/2021    Lab Results  Component Value Date/Time   HGBA1C 5.8 (H) 06/06/2021 12:10 PM   HGBA1C 6.0 (H) 03/01/2021 02:15 PM   MICROALBUR 30 02/23/2021 05:06 PM   MICROALBUR 30 02/23/2020 01:26 PM    Last diabetic Eye exam:  Lab Results  Component Value Date/Time   HMDIABEYEEXA No Retinopathy 01/21/2020 12:00 AM    Last diabetic Foot exam: No results found for: HMDIABFOOTEX   Lab Results  Component Value Date   CHOL 182 06/06/2021   HDL 58 06/06/2021   LDLCALC 108 (H) 06/06/2021   TRIG 87 06/06/2021   CHOLHDL 3.1 06/06/2021    Hepatic Function Latest Ref Rng & Units 06/06/2021 02/23/2021 11/02/2020  Total Protein 6.0 - 8.5 g/dL 7.7 7.9 7.2  Albumin 3.7 - 4.7 g/dL 4.7 3.9 -  AST 0 - 40 IU/L 23 18 -  ALT 0 - 32 IU/L 12 13 -  Alk Phosphatase 44 - 121 IU/L 70 71 -  Total Bilirubin 0.0 - 1.2 mg/dL 0.3 0.6 -  Bilirubin, Direct 0.00 - 0.40 mg/dL - - -    Lab Results  Component Value Date/Time   TSH 1.170 11/02/2020 10:36 AM   TSH 1.240 06/11/2019 02:24 PM    CBC Latest Ref Rng & Units 07/07/2021 02/23/2021 08/11/2020  WBC 4.0 - 10.5 K/uL 4.4 4.1 4.0  Hemoglobin 12.0 - 15.0 g/dL 12.3 11.6(L) 12.0  Hematocrit 36.0 - 46.0 % 38.6 35.1(L)  36.9  Platelets 150 - 400 K/uL 192 194 199    Lab Results  Component Value Date/Time   VD25OH 43.0 10/28/2019 11:37 AM    Clinical ASCVD: {YES/NO:21197} The ASCVD Risk score (Arnett DK, et al., 2019) failed to calculate for the following reasons:   Unable to determine if patient is Non-Hispanic African American     Depression screen [facility] 2/9 06/06/2021 06/06/2021 02/14/2021  Decreased Interest - 3 0  Down, Depressed, Hopeless - 1 0  PHQ - 2 Score - 4 0  Altered sleeping (No Data) 3 2  Tired, decreased energy - 2 0  Change in appetite (No Data) 3 0  Feeling bad or failure about yourself  - 0 0  Trouble concentrating - 3 0  Moving slowly or fidgety/restless - 3 0  Suicidal thoughts - 0 0  PHQ-9 Score - 18 2  Difficult doing work/chores - Somewhat difficult Not difficult at all  Some recent data might be hidden     ***Other: (CHADS2VASc if Afib, MMRC or CAT for COPD, ACT, DEXA)  Social History   Tobacco Use  Smoking Status Never  Smokeless Tobacco Never   BP Readings from Last 3 Encounters:  07/08/21 (!) 186/74  06/27/21 136/70  06/06/21 134/86   Pulse Readings from Last 3 Encounters:  07/08/21 63  06/27/21 65  06/06/21 (!) 59   Wt Readings from Last 3 Encounters:  07/07/21 148 lb (67.1 kg)  06/27/21 149 lb (67.6 kg)  06/06/21 149 lb 6.4 oz (67.8 kg)   BMI Readings from Last 3 Encounters:  07/07/21 30.93 kg/m  06/27/21 30.09 kg/m  06/06/21 30.18 kg/m    Assessment/Interventions: Review of patient past medical history, allergies, medications, health status, including review of consultants reports, laboratory and other test data, was performed as part of comprehensive evaluation and provision of chronic care management services.   SDOH:  (Social Determinants of Health) assessments and interventions performed: {yes/no:20286}  SDOH Screenings   Alcohol Screen: Not on file  Depression (PHQ2-9): Medium Risk   PHQ-2 Score: 18  Financial Resource Strain: Low Risk    Difficulty of Paying Living Expenses: Not hard at all  Food Insecurity: No Food Insecurity   Worried About Charity fundraiser in the Last Year: Never true   Ran Out of Food in the Last Year: Never true  Housing: Low Risk    Last Housing Risk Score: 0  Physical Activity: Sufficiently Active   Days of Exercise per  Week: 5 days   Minutes of Exercise per Session: 60 min  Social Connections: Not on file  Stress: No Stress Concern Present   Feeling of Stress : Not at all  Tobacco Use: Low Risk    Smoking Tobacco Use: Never   Smokeless Tobacco Use: Never   Passive Exposure: Not on file  Transportation Needs: No Transportation Needs   Lack of Transportation (Medical): No   Lack of Transportation (Non-Medical): No    CCM Care Plan  Allergies  Allergen Reactions   Fosamax [Alendronate Sodium] Other (See Comments)    REACTION: "CAUSE GI UPSET AND FATIGUE"   Omeprazole Other (See Comments)    Lower ab pain   Motrin [Ibuprofen] Other (See Comments)    GI upset- has to eat prior to taking   Fluvastatin Other (See Comments)    mild memoryproblems of confusion     Medications Reviewed Today     Reviewed by Lynne Logan, RN (Registered Nurse) on 06/20/21 at 1355  Med List Status: <None>   Medication Order Taking? Sig Documenting Provider Last Dose Status Informant  0.9 %  sodium chloride infusion [id number]   Minette Brine, FNP  Active   aspirin EC 81 MG tablet [id number] No Take 81 mg by mouth daily. Swallow whole. [provider] Taking Active   calcium carbonate (TUMS - DOSED IN MG ELEMENTAL CALCIUM) 500 MG chewable tablet [id number] No Chew 1 tablet by mouth as needed. [provider] Taking Active   CALCIUM PO [id number] No Take by mouth daily. [provider] Taking Active   Cholecalciferol (VITAMIN D-3 PO) [id number] No Take 1,000 Units by mouth daily. [provider] Taking Active   Cyanocobalamin (VITAMIN B-12 PO) [id number] No  [provider] Taking Active   famotidine (PEPCID) 40 MG tablet [id number] No as needed. [provider] Taking Active   felodipine (PLENDIL) 10 MG 24 hr tablet [id number] No TAKE 1 TABLET BY MOUTH EVERY DAY Glendale Chard, MD Taking Active   fish oil-omega-3 fatty acids 1000 MG capsule [id number] No Take 2 capsules by  mouth daily. [provider] Taking Active   Fluocinolone Acetonide Scalp 0.01 % OIL [id number] No Apply to affected area of scalp as directed 1-2 x/day as needed for itching Glendale Chard, MD Taking Active   GLUCOSAMINE-CHONDROITIN PO [id number] No Take 2 tablets by mouth daily. [provider] Taking Active   L-Methylfolate-Algae-B12-B6 Glade Stanford) 3-90.314-2-35 MG CAPS [id number] No Take 1 capsule by mouth 2 (two) times daily.  [provider] Taking Active            Med Note Tamsen Snider   Tue Aug 19, 2018  9:17 AM)    Magnesium 250 MG TABS [id number] No Take by mouth daily. Tues, Thursday, Saturday [provider] Taking Active   Menthol, Topical Analgesic, (BIOFREEZE ROLL-ON EX) [id number] No Apply 1 application topically as needed (pain in back,neck,thumb).  Patient not taking: Reported on 06/06/2021   [provider] Not Taking Active   nitroGLYCERIN (NITROSTAT) 0.4 MG SL tablet [id number] No Place 1 tablet (0.4 mg total) under the tongue every 5 (five) minutes as needed for chest pain. Isaiah Serge, NP Taking Active   Polyvinyl Alcohol-Povidone PF 1.4-0.6 % SOLN [id number] No Place 1 drop into both eyes daily as needed (dry eyes).  [provider] Taking Active   pravastatin (PRAVACHOL) 40 MG tablet [id number] No Take 2 tablets (80 mg total) by mouth 3 (three) times a week. Mondays,wednesdays and fridays. 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[place]) 10/28/2019    Class 1 obesity due to excess calories with serious comorbidity and body mass index (BMI) of 30.0 to 30.9 in adult 10/28/2019   Vitamin D deficiency disease 10/28/2019   Chronic bilateral low back pain without sciatica 10/28/2019   Hip pain, bilateral 10/28/2019   Malignant neoplasm of upper-outer quadrant of right breast in female, estrogen receptor positive ([place]) 08/11/2018   Chronic renal disease, stage II  05/15/2018   Hypertensive nephropathy 05/15/2018   Adult BMI 29.0-29.9 kg/sq m 05/15/2018   Dyspnea on exertion 02/17/2015   Incomplete right bundle branch block 20/91/0681   Systolic murmur 66/19/6940   Pericardial effusion 02/17/2015   Osteopenia 10/05/2011   Type 2 diabetes mellitus with stage 2 chronic kidney disease, without long-term current use of insulin ([place]) 05/06/2010   SARCOIDOSIS 03/21/2007   HYPERLIPIDEMIA 03/21/2007   Obstructive sleep apnea 03/21/2007   Essential hypertension 03/21/2007    Immunization History  Administered Date(s) Administered   Fluad Quad(high Dose 65+) 02/12/2019, 02/23/2020, 03/30/2021   Influenza Split 04/10/2011, 04/15/2012, 03/04/2013, 03/04/2014, 03/05/2015, 03/04/2017   Influenza Whole 06/13/2010   Influenza,inj,Quad PF,6+ Mos 03/22/2016   Influenza-Unspecified 03/22/2016, 03/04/2018   PFIZER(Purple Top)SARS-COV-2 Vaccination 08/09/2019, 09/08/2019, 05/18/2020   Pneumococcal Conjugate-13 11/13/2017   Pneumococcal Polysaccharide-23 06/04/2009, 12/28/2010, 06/11/2019   Td 05/31/2008   Tdap 12/13/2018   Unspecified SARS-COV-2 Vaccination 09/08/2019    Conditions to be addressed/monitored:  {USCCMDZASSESSMENTOPTIONS:23563}  There are no care plans that you recently modified to display for this patient.    Medication Assistance: {MEDASSISTANCEINFO:25044}  Compliance/Adherence/Medication fill history: Care Gaps: ***  Star-Rating Drugs: ***  Patient's preferred pharmacy is:  [id number]- [place], [place] - [id number]: [phone]: [phone] Uses pill box? {Yes or If no, why not?:20788} Pt endorses ***% compliance  We discussed: {Pharmacy options:24294} Patient decided to: {US Pharmacy Plan:23885}  Care Plan and Follow Up Patient Decision:  {FOLLOWUP:24991}  Plan: {CM FOLLOW UP PLAN:25073}  ***

## 2021-08-09 ENCOUNTER — Ambulatory Visit (INDEPENDENT_AMBULATORY_CARE_PROVIDER_SITE_OTHER): Payer: Medicare PPO

## 2021-08-09 DIAGNOSIS — E7800 Pure hypercholesterolemia, unspecified: Secondary | ICD-10-CM

## 2021-08-09 DIAGNOSIS — I129 Hypertensive chronic kidney disease with stage 1 through stage 4 chronic kidney disease, or unspecified chronic kidney disease: Secondary | ICD-10-CM

## 2021-08-09 NOTE — Progress Notes (Incomplete)
Current Barriers:  Unable to independently monitor therapeutic efficacy  Pharmacist Clinical Goal(s):  Patient will achieve adherence to monitoring guidelines and medication adherence to achieve therapeutic efficacy through collaboration with PharmD and provider.   Interventions: 1:1 collaboration with Glendale Chard, MD regarding development and update of comprehensive plan of care as evidenced by provider attestation and co-signature Inter-disciplinary care team collaboration (see longitudinal plan of care) Comprehensive medication review performed; medication list updated in electronic medical record  Hypertension (BP goal <130/80) -Controlled -Current treatment: Felodipine 10 mg tablet once per day  -Current home readings: BP readings: 07/07/2021 160/75, evening 161/65 -  07/03/2021 - 135/57, 07/26/21 - 153/67 pulse 58  -Current dietary habits: she is eating, she normally eats  -Current exercise habits: please see hyperlipidemia for more details  -Denies hypotensive/hypertensive symptoms -Educated on Daily salt intake goal < 2300 mg; Exercise goal of 150 minutes per week; Importance of home blood pressure monitoring; -Patient reports that her BP is over 77 years old  -Recommended patient use OTC book to get a new BP cuff  -Counseled to monitor BP at home at least once per day, document, and provide log at future appointments -Recommended to continue current medication  Hyperlipidemia: (LDL goal < 70) -Uncontrolled -Current treatment: Pravastatin 80 mg tablet Monday, Wednesday and Friday Increased in February at last PCP visit  -Current dietary patterns: patient reports that she is eating more salad, and limiting the amount of fried foods that she is eating. She does eat fried fish sometimes, but rarely.  -Current exercise habits: she does pool exercises and the fitness room at the center. She also walks around the gym. She enjoys going to the pool and is there everyday. She is there  Saturday, Sunday that she does not go. She walks around her driveway.  -Educated on Benefits of statin for ASCVD risk reduction; Importance of limiting foods high in cholesterol; -Recommended to continue current medication  Patient Goals/Self-Care Activities Patient will:  - take medications as prescribed as evidenced by patient report and record review  Follow Up Plan: The patient has been provided with contact information for the care management team and has been advised to call with any health related questions or concerns.   Chronic Care Management Pharmacy Note  08/09/2021 Name:  Mariah Burns MRN:  [id number] DOB:  Aug 22, 1945  Summary: ***  Recommendations/Changes made from today's visit: ***  Plan: ***   Subjective: Mariah Burns is an 76 y.o. year old female who is a primary patient of Glendale Chard, MD.  The CCM team was consulted for assistance with disease management and care coordination needs.    Engaged with patient by telephone for follow up visit in response to provider referral for pharmacy case management and/or care coordination services. Patient reports that her BP has been higher than she likes. She usually eats breakfast around noon and she a cup of green tea with lemon juice. She eats five almonds and five cashews daily. She takes her medicine afterwards.   Consent to Services:  The patient was given information about Chronic Care Management services, agreed to services, and gave verbal consent prior to initiation of services.  Please see initial visit note for detailed documentation.   Patient Care Team: Glendale Chard, MD as PCP - General (Internal Medicine) Belva Crome, MD as PCP - Cardiology (Cardiology) Mauro Kaufmann, RN as Oncology Nurse Navigator Carlynn Spry, Charlott Holler, RN as Oncology Nurse Navigator Excell Seltzer, MD (Inactive) as Consulting Physician (General Surgery)  Truitt Merle, MD as Consulting Physician (Hematology) Gery Pray,  MD as Consulting Physician (Radiation Oncology) Alla Feeling, NP as Nurse Practitioner (Nurse Practitioner) Warren Danes, PA-C as Physician Assistant (Dermatology) Lynne Logan, RN as [place] [facility] [place] Athanas, Sharyn Blitz, [facility] (Pharmacist)  Recent office visits: ***  Recent consult visits: [facility] visits: {:PHR,HOSPITALDCMEDREC}   Objective:  Lab Results  Component Value Date   CREATININE 0.79 07/07/2021   BUN 17 07/07/2021   EGFR 64 06/06/2021   GFRNONAA >60 07/07/2021   GFRAA 85 05/26/2020   NA 139 07/07/2021   K 3.6 07/07/2021   CALCIUM 9.5 07/07/2021   CO2 26 07/07/2021   GLUCOSE 109 (H) 07/07/2021    Lab Results  Component Value Date/Time   HGBA1C 5.8 (H) 06/06/2021 12:10 PM   HGBA1C 6.0 (H) 03/01/2021 02:15 PM   MICROALBUR 30 02/23/2021 05:06 PM   MICROALBUR 30 02/23/2020 01:26 PM    Last diabetic Eye exam:  Lab Results  Component Value Date/Time   HMDIABEYEEXA No Retinopathy 01/21/2020 12:00 AM    Last diabetic Foot exam: No results found for: HMDIABFOOTEX   Lab Results  Component Value Date   CHOL 182 06/06/2021   HDL 58 06/06/2021   LDLCALC 108 (H) 06/06/2021   TRIG 87 06/06/2021   CHOLHDL 3.1 06/06/2021    Hepatic Function Latest Ref Rng & Units 06/06/2021 02/23/2021 11/02/2020  Total Protein 6.0 - 8.5 g/dL 7.7 7.9 7.2  Albumin 3.7 - 4.7 g/dL 4.7 3.9 -  AST 0 - 40 IU/L 23 18 -  ALT 0 - 32 IU/L 12 13 -  Alk Phosphatase 44 - 121 IU/L 70 71 -  Total Bilirubin 0.0 - 1.2 mg/dL 0.3 0.6 -  Bilirubin, Direct 0.00 - 0.40 mg/dL - - -    Lab Results  Component Value Date/Time   TSH 1.170 11/02/2020 10:36 AM   TSH 1.240 06/11/2019 02:24 PM    CBC Latest Ref Rng & Units 07/07/2021 02/23/2021 08/11/2020  WBC 4.0 - 10.5 K/uL 4.4 4.1 4.0  Hemoglobin 12.0 - 15.0 g/dL 12.3 11.6(L) 12.0  Hematocrit 36.0 - 46.0 % 38.6 35.1(L) 36.9  Platelets 150 - 400 K/uL 192 194 199    Lab Results  Component Value Date/Time   VD25OH  43.0 10/28/2019 11:37 AM    Clinical ASCVD: {YES/NO:21197} The ASCVD Risk score (Arnett DK, et al., 2019) failed to calculate for the following reasons:   Unable to determine if patient is Non-Hispanic African American    Depression screen [facility] 2/9 06/06/2021 06/06/2021 02/14/2021  Decreased Interest - 3 0  Down, Depressed, Hopeless - 1 0  PHQ - 2 Score - 4 0  Altered sleeping (No Data) 3 2  Tired, decreased energy - 2 0  Change in appetite (No Data) 3 0  Feeling bad or failure about yourself  - 0 0  Trouble concentrating - 3 0  Moving slowly or fidgety/restless - 3 0  Suicidal thoughts - 0 0  PHQ-9 Score - 18 2  Difficult doing work/chores - Somewhat difficult Not difficult at all  Some recent data might be hidden     ***Other: (CHADS2VASc if Afib, MMRC or CAT for COPD, ACT, DEXA)  Social History   Tobacco Use  Smoking Status Never  Smokeless Tobacco Never   BP Readings from Last 3 Encounters:  07/08/21 (!) 186/74  06/27/21 136/70  06/06/21 134/86   Pulse Readings from Last 3 Encounters:  07/08/21 63  06/27/21  65  06/06/21 (!) 59   Wt Readings from Last 3 Encounters:  07/07/21 148 lb (67.1 kg)  06/27/21 149 lb (67.6 kg)  06/06/21 149 lb 6.4 oz (67.8 kg)   BMI Readings from Last 3 Encounters:  07/07/21 30.93 kg/m  06/27/21 30.09 kg/m  06/06/21 30.18 kg/m    Assessment/Interventions: Review of patient past medical history, allergies, medications, health status, including review of consultants reports, laboratory and other test data, was performed as part of comprehensive evaluation and provision of chronic care management services.   SDOH:  (Social Determinants of Health) assessments and interventions performed: {yes/no:20286}  SDOH Screenings   Alcohol Screen: Not on file  Depression (PHQ2-9): Medium Risk   PHQ-2 Score: 18  Financial Resource Strain: Low Risk    Difficulty of Paying Living Expenses: Not hard at all  Food Insecurity: No Food Insecurity    Worried About Charity fundraiser in the Last Year: Never true   Ran Out of Food in the Last Year: Never true  Housing: Low Risk    Last Housing Risk Score: 0  Physical Activity: Sufficiently Active   Days of Exercise per Week: 5 days   Minutes of Exercise per Session: 60 min  Social Connections: Not on file  Stress: No Stress Concern Present   Feeling of Stress : Not at all  Tobacco Use: Low Risk    Smoking Tobacco Use: Never   Smokeless Tobacco Use: Never   Passive Exposure: Not on file  Transportation Needs: No Transportation Needs   Lack of Transportation (Medical): No   Lack of Transportation (Non-Medical): No    CCM Care Plan  Allergies  Allergen Reactions   Fosamax [Alendronate Sodium] Other (See Comments)    REACTION: "CAUSE GI UPSET AND FATIGUE"   Omeprazole Other (See Comments)    Lower ab pain   Motrin [Ibuprofen] Other (See Comments)    GI upset- has to eat prior to taking   Fluvastatin Other (See Comments)    mild memoryproblems of confusion     Medications Reviewed Today     Reviewed by Lynne Logan, RN (Registered Nurse) on 06/20/21 at 1355  Med List Status: <None>   Medication Order Taking? Sig Documenting Provider Last Dose Status Informant  0.9 %  sodium chloride infusion [id number]   Minette Brine, FNP  Active   aspirin EC 81 MG tablet [id number] No Take 81 mg by mouth daily. Swallow whole. [provider] Taking Active   calcium carbonate (TUMS - DOSED IN MG ELEMENTAL CALCIUM) 500 MG chewable tablet [id number] No Chew 1 tablet by mouth as needed. [provider] Taking Active   CALCIUM PO [id number] No Take by mouth daily. [provider] Taking Active   Cholecalciferol (VITAMIN D-3 PO) [id number] No Take 1,000 Units by mouth daily. [provider] Taking Active   Cyanocobalamin (VITAMIN B-12 PO) [id number] No  [provider] Taking Active   famotidine (PEPCID) 40 MG tablet [id number] No as needed. [provider] Taking Active   felodipine (PLENDIL) 10 MG 24 hr tablet [id number] No TAKE 1 TABLET BY MOUTH EVERY DAY Glendale Chard, MD Taking Active   fish oil-omega-3 fatty acids 1000 MG capsule [id number] No Take 2 capsules by mouth daily. [provider] Taking Active   Fluocinolone Acetonide Scalp 0.01 % OIL [id number] No Apply to affected area of scalp as directed 1-2 x/day as needed for itching Glendale Chard, MD Taking Active   GLUCOSAMINE-CHONDROITIN PO [id number] No Take 2  tablets by mouth daily. [provider] Taking Active   L-Methylfolate-Algae-B12-B6 Glade Stanford) 3-90.314-2-35 MG CAPS [id number] No Take 1 capsule by mouth 2 (two) times daily.  [provider] Taking Active            Med Note Tamsen Snider   Tue Aug 19, 2018  9:17 AM)    Magnesium 250 MG TABS [id number] No Take by mouth daily. Tues, Thursday, Saturday [provider] Taking Active   Menthol, Topical Analgesic, (BIOFREEZE ROLL-ON EX) [id number] No Apply 1 application topically as needed (pain in back,neck,thumb).  Patient not taking: Reported on 06/06/2021   [provider] Not Taking Active   nitroGLYCERIN (NITROSTAT) 0.4 MG SL tablet [id number] No Place 1 tablet (0.4 mg total) under the tongue every 5 (five) minutes as needed for chest pain. Isaiah Serge, NP Taking Active   Polyvinyl Alcohol-Povidone PF 1.4-0.6 % SOLN [id number] No Place 1 drop into both eyes daily as needed (dry eyes).  [provider] Taking Active   pravastatin (PRAVACHOL) 40 MG tablet [id number] No Take 2 tablets (80 mg total) by mouth 3 (three) times a week. Mondays,wednesdays and fridays. 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/28/2019   Class 1 obesity due to excess calories with serious comorbidity and body mass index (BMI) of 30.0 to 30.9 in adult 10/28/2019   Vitamin D deficiency disease 10/28/2019   Chronic bilateral low back pain without sciatica 10/28/2019   Hip pain, bilateral 10/28/2019   Malignant neoplasm of upper-outer quadrant of right breast in female, estrogen receptor positive ([place]) 08/11/2018   Chronic renal disease, stage II 05/15/2018   Hypertensive nephropathy 05/15/2018   Adult BMI 29.0-29.9 kg/sq m 05/15/2018   Dyspnea on exertion 02/17/2015   Incomplete right bundle branch block 27/51/7001   Systolic murmur 74/94/4967   Pericardial effusion 02/17/2015   Osteopenia 10/05/2011   Type 2 diabetes mellitus with stage 2 chronic kidney disease, without long-term current use of insulin ([place]) 05/06/2010   SARCOIDOSIS 03/21/2007   HYPERLIPIDEMIA 03/21/2007   Obstructive sleep apnea 03/21/2007   Essential hypertension 03/21/2007    Immunization History  Administered Date(s) Administered   Fluad Quad(high Dose 65+) 02/12/2019, 02/23/2020, 03/30/2021   Influenza Split 04/10/2011, 04/15/2012, 03/04/2013, 03/04/2014, 03/05/2015, 03/04/2017   Influenza Whole 06/13/2010   Influenza,inj,Quad PF,6+ Mos 03/22/2016   Influenza-Unspecified 03/22/2016, 03/04/2018   PFIZER(Purple Top)SARS-COV-2 Vaccination 08/09/2019, 09/08/2019, 05/18/2020   Pneumococcal Conjugate-13 11/13/2017   Pneumococcal Polysaccharide-23 06/04/2009, 12/28/2010, 06/11/2019   Td 05/31/2008   Tdap 12/13/2018   Unspecified SARS-COV-2 Vaccination 09/08/2019    Conditions to be addressed/monitored:  Hyperlipidemia and Diabetes  There are no care plans that you recently modified to display for this patient.     Medication Assistance: None required.  Patient affirms current coverage meets needs.  Compliance/Adherence/Medication fill history: Care Gaps: Shingrix COVID-19  Ophthalmology   Star-Rating Drugs: Pravastatin 80 mg table   Patient's preferred pharmacy is:  [id number]- [place],  [place] - [place]. AT [address] [place] [address] Phone: [phone] Fax: [phone]  Uses pill box? Yes Pt endorses 90 % compliance  We discussed: Benefits of medication synchronization, packaging and delivery as well as enhanced pharmacist oversight with [employer]. Patient decided to: Continue current medication management strategy  Care Plan and Follow Up Patient Decision:  Patient agrees to Care Plan and Follow-up.  Plan: The patient has been provided with contact information for the care management team and has been advised to call with any health related questions or concerns.   [name], CPP, PharmD Clinical Pharmacist Practitioner [facility] [phone]

## 2021-08-14 ENCOUNTER — Ambulatory Visit
Admission: RE | Admit: 2021-08-14 | Discharge: 2021-08-14 | Disposition: A | Discharge status: Home / Self Care | Payer: Medicare PPO | Source: Ambulatory Visit | Attending: Nurse Practitioner | Admitting: Nurse Practitioner

## 2021-08-14 ENCOUNTER — Other Ambulatory Visit: Payer: Self-pay

## 2021-08-14 DIAGNOSIS — M8589 Other specified disorders of bone density and structure, multiple sites: Secondary | ICD-10-CM | POA: Diagnosis not present

## 2021-08-14 DIAGNOSIS — C50411 Malignant neoplasm of upper-outer quadrant of right female breast: Secondary | ICD-10-CM

## 2021-08-14 DIAGNOSIS — Z780 Asymptomatic menopausal state: Secondary | ICD-10-CM | POA: Diagnosis not present

## 2021-08-16 NOTE — Patient Instructions (Signed)
Visit Information ?It was great speaking with you today!  Please let me know if you have any questions about our visit. ? ? Goals Addressed   ? ?  ?  ?  ?  ? This Visit's Progress  ?  Manage My Medicine     ?  Timeframe:  Long-Range Goal ?Priority:  High ?Start Date:                             ?Expected End Date:                      ? ?Follow Up Date 02/13/2022 ? ? ?In Progress:  ?- call for medicine refill 2 or 3 days before it runs out ?- call if I am sick and can't take my medicine ?- keep a list of all the medicines I take; vitamins and herbals too ?- use a pillbox to sort medicine ?- use an alarm clock or phone to remind me to take my medicine  ?  ?Why is this important?   ?These steps will help you keep on track with your medicines. ?  ?Notes:  ?Please call with any questions.  ?  ? ?  ? ?Patient Care Plan: [place]  ?  ? ?Problem Identified: HTN, HLD, Osteoperosis/Osteopenia   ?Priority: High  ?  ? ?Goal: Patient Stated   ?Recent Progress: On track  ?Priority: High  ?Note:   ?Current Barriers:  ?Unable to independently monitor therapeutic efficacy ? ?Pharmacist Clinical Goal(s):  ?Patient will achieve adherence to monitoring guidelines and medication adherence to achieve therapeutic efficacy through collaboration with PharmD and provider.  ? ?Interventions: ?1:1 collaboration with Glendale Chard, MD regarding development and update of comprehensive plan of care as evidenced by provider attestation and co-signature ?Inter-disciplinary care team collaboration (see longitudinal plan of care) ?Comprehensive medication review performed; medication list updated in electronic medical record ? ?Hypertension (BP goal <130/80) ?-Controlled ?-Current treatment: ?Felodipine 10 mg tablet once per day Appropriate, Effective, Safe, Accessible ? ?-Current home readings: BP readings: 07/07/2021 160/75, evening 161/65 -  07/03/2021 - 135/57, 07/26/21 - 153/67 pulse 58  ?-Current dietary habits: she is eating, she  normally eats  ?-Current exercise habits: please see hyperlipidemia for more details  ?-Denies hypotensive/hypertensive symptoms ?-Educated on Daily salt intake goal < 2300 mg; ?Exercise goal of 150 minutes per week; ?Importance of home blood pressure monitoring; ?-Patient reports that her BP is over 64 years old  ?-Recommended patient use OTC book to get a new BP cuff  ?-Counseled to monitor BP at home at least once per day, document, and provide log at future appointments ?-Recommended to continue current medication ? ?Hyperlipidemia: (LDL goal < 70) ?-Uncontrolled ?-Current treatment: ?Pravastatin 80 mg tablet Monday, Wednesday and Friday Appropriate, Query effective,  ?Increased in February at last PCP visit  ?-Current dietary patterns: patient reports that she is eating more salad, and limiting the amount of fried foods that she is eating. She does eat fried fish sometimes, but rarely.  ?-Current exercise habits: she does pool exercises and the fitness room at the center. She also walks around the gym. She enjoys going to the pool and is there everyday. She is there Saturday, Sunday that she does not go. She walks around her driveway.  ?-Educated on Benefits of statin for ASCVD risk reduction; ?Importance of limiting foods high in cholesterol; ?-Recommended to continue current medication ? ?Patient Goals/Self-Care  Activities ?Patient will:  ?- take medications as prescribed as evidenced by patient report and record review ? ?Follow Up Plan: The patient has been provided with contact information for the care management team and has been advised to call with any health related questions or concerns.  ?  ? ? ? ? ?Patient agreed to services and verbal consent obtained.  ? ?The patient verbalized understanding of instructions, educational materials, and care plan provided today and agreed to receive a mailed copy of patient instructions, educational materials, and care plan.  ? ?[name], PharmD ?Clinical  Pharmacist ?[facility] ?[phone] ?  ?

## 2021-08-17 ENCOUNTER — Telehealth: Payer: Self-pay | Admitting: *Deleted

## 2021-08-17 NOTE — Telephone Encounter (Signed)
-----   Message from Alla Feeling, NP sent at 08/17/2021  2:01 PM EDT ----- ?Please let pt know bone density has decreased from 2020, and fracture risk especially hip fracture risk has increased. Encourage her to take calcium and vit D daily, I will discuss further at 3/22 visit please remind her.  ?Thanks, ?Regan Rakers, NP ?

## 2021-08-17 NOTE — Telephone Encounter (Signed)
Per Cira Rue, NP, called pt with message below. Advised to call office for any concerns.  ?

## 2021-08-18 ENCOUNTER — Ambulatory Visit: Payer: Self-pay

## 2021-08-18 ENCOUNTER — Telehealth: Payer: Medicare PPO

## 2021-08-18 DIAGNOSIS — I129 Hypertensive chronic kidney disease with stage 1 through stage 4 chronic kidney disease, or unspecified chronic kidney disease: Secondary | ICD-10-CM

## 2021-08-18 DIAGNOSIS — E1122 Type 2 diabetes mellitus with diabetic chronic kidney disease: Secondary | ICD-10-CM

## 2021-08-18 DIAGNOSIS — G3184 Mild cognitive impairment, so stated: Secondary | ICD-10-CM

## 2021-08-18 DIAGNOSIS — E7800 Pure hypercholesterolemia, unspecified: Secondary | ICD-10-CM

## 2021-08-18 NOTE — Chronic Care Management (AMB) (Signed)
?Chronic Care Management  ? ?CCM RN Visit Note ? ?08/18/2021 ?Name: Mariah Burns MRN: [id number] DOB: 1945-08-19 ? ?Subjective: ?Mariah Burns is a 76 y.o. year old female who is a primary care patient of Glendale Chard, MD. The care management team was consulted for assistance with disease management and care coordination needs.   ? ?Engaged with patient by telephone for follow up visit in response to provider referral for case management and/or care coordination services.  ? ?Consent to Services:  ?The patient was given information about Chronic Care Management services, agreed to services, and gave verbal consent prior to initiation of services.  Please see initial visit note for detailed documentation.  ? ?Patient agreed to services and verbal consent obtained.  ? ?Assessment: Review of patient past medical history, allergies, medications, health status, including review of consultants reports, laboratory and other test data, was performed as part of comprehensive evaluation and provision of chronic care management services.  ? ?SDOH (Social Determinants of Health) assessments and interventions performed:  Yes, no acute needs ? ?CCM Care Plan ? ?Allergies  ?Allergen Reactions  ? Fosamax [Alendronate Sodium] Other (See Comments)  ?  REACTION: "CAUSE GI UPSET AND FATIGUE"  ? Omeprazole Other (See Comments)  ?  Lower ab pain  ? Motrin [Ibuprofen] Other (See Comments)  ?  GI upset- has to eat prior to taking  ? Fluvastatin Other (See Comments)  ?  mild memoryproblems of confusion   ? ? ?Outpatient Encounter Medications as of 08/18/2021  ?Medication Sig  ? aspirin EC 81 MG tablet Take 81 mg by mouth daily. Swallow whole.  ? calcium carbonate (TUMS - DOSED IN MG ELEMENTAL CALCIUM) 500 MG chewable tablet Chew 1 tablet by mouth as needed.  ? CALCIUM PO Take by mouth daily.  ? Cholecalciferol (VITAMIN D-3 PO) Take 1,000 Units by mouth daily.  ? Cyanocobalamin (VITAMIN B-12 PO)   ? famotidine (PEPCID) 40 MG tablet  as needed.  ? felodipine (PLENDIL) 10 MG 24 hr tablet TAKE 1 TABLET BY MOUTH EVERY DAY  ? fish oil-omega-3 fatty acids 1000 MG capsule Take 2 capsules by mouth daily.  ? Fluocinolone Acetonide Scalp 0.01 % OIL Apply to affected area of scalp as directed 1-2 x/day as needed for itching  ? GLUCOSAMINE-CHONDROITIN PO Take 2 tablets by mouth daily.  ? L-Methylfolate-Algae-B12-B6 (METANX) 3-90.314-2-35 MG CAPS Take 1 capsule by mouth 2 (two) times daily.   ? Magnesium 250 MG TABS Take by mouth daily. Tues, Thursday, Saturday  ? Menthol, Topical Analgesic, (BIOFREEZE ROLL-ON EX) Apply 1 application topically as needed (pain in back,neck,thumb). (Patient not taking: Reported on 06/06/2021)  ? nitroGLYCERIN (NITROSTAT) 0.4 MG SL tablet Place 1 tablet (0.4 mg total) under the tongue every 5 (five) minutes as needed for chest pain.  ? Polyvinyl Alcohol-Povidone PF 1.4-0.6 % SOLN Place 1 drop into both eyes daily as needed (dry eyes).   ? pravastatin (PRAVACHOL) 80 MG tablet Take 1 tablet (80 mg total) by mouth every evening. Take one tablet by mouth on Monday Wednesday and Friday.  ? TART CHERRY PO   ? ?Facility-Administered Encounter Medications as of 08/18/2021  ?Medication  ? 0.9 %  sodium chloride infusion  ? ? ?Patient Active Problem List  ? Diagnosis Date Noted  ? Chronic diastolic heart failure ([place]) 06/06/2021  ? Insomnia 03/30/2021  ? Pure hypercholesterolemia 03/07/2021  ? Reactive depression 11/19/2020  ? Dry mouth 11/19/2020  ? Memory loss 11/19/2020  ? Secondary and unspecified malignant neoplasm  of axilla and upper limb lymph nodes ([place]) 10/28/2019  ? Class 1 obesity due to excess calories with serious comorbidity and body mass index (BMI) of 30.0 to 30.9 in adult 10/28/2019  ? Vitamin D deficiency disease 10/28/2019  ? Chronic bilateral low back pain without sciatica 10/28/2019  ? Hip pain, bilateral 10/28/2019  ? Malignant neoplasm of upper-outer quadrant of right breast in female, estrogen receptor positive  ([place]) 08/11/2018  ? Chronic renal disease, stage II 05/15/2018  ? Hypertensive nephropathy 05/15/2018  ? Adult BMI 29.0-29.9 kg/sq m 05/15/2018  ? Dyspnea on exertion 02/17/2015  ? Incomplete right bundle branch block 02/17/2015  ? Systolic murmur 16/03/9603  ? Pericardial effusion 02/17/2015  ? Osteopenia 10/05/2011  ? Type 2 diabetes mellitus with stage 2 chronic kidney disease, without long-term current use of insulin ([place]) 05/06/2010  ? SARCOIDOSIS 03/21/2007  ? HYPERLIPIDEMIA 03/21/2007  ? Obstructive sleep apnea 03/21/2007  ? Essential hypertension 03/21/2007  ? ? ?Conditions to be addressed/monitored: DM II, Hypertensive Nephropathy, HLD, Mild Cognitive Impairment  ? ?Care Plan : RN Care Manager plan of care  ?Updates made by Lynne Logan, RN since 08/18/2021 12:00 AM  ?  ? ?Problem: No plan of care established for management of chronic disease states (DM II, Hypertensive Nephropathy, HLD, Mild Cognitive Impairment)   ?Priority: High  ?  ? ?Long-Range Goal: Development of plan of care for management of chronic disease management (DM II, Hypertensive Nephropathy, HLD)   ?Start Date: 05/17/2021  ?Expected End Date: 05/17/2022  ?Recent Progress: On track  ?Priority: High  ?Note:   ?Current Barriers:  ?Knowledge Deficits related to plan of care for management of DM II, Hypertensive Nephropathy, HLD, Mild Cognitive Impairment, Mild Cognitive Impairment  ?Chronic Disease Management support and education needs related to DM II, Hypertensive Nephropathy, HLD, Mild Cognitive Impairment  ? ?RNCM Clinical Goal(s):  ?Patient will verbalize basic understanding of  DM II, Hypertensive Nephropathy, HLD) disease process and self health management plan as evidenced by patient will report having no disease exacerbations related to her chronic disease states as listed above  ?demonstrate Improved health management independence as evidenced by patient will report 100% adherence to her prescribed treatment plan  ?continue to  work with RN Care Manager to address care management and care coordination needs related to  DM II, Hypertensive Nephropathy, HLD, Mild Cognitive Impairment as evidenced by adherence to CM Team Scheduled appointments ?demonstrate ongoing self health care management ability   as evidenced by    through collaboration with RN Care manager, provider, and care team.  ? ?Interventions: ?1:1 collaboration with primary care provider regarding development and update of comprehensive plan of care as evidenced by provider attestation and co-signature ?Inter-disciplinary care team collaboration (see longitudinal plan of care) ?Evaluation of current treatment plan related to  self management and patient's adherence to plan as established by provider ? ? Chronic Kidney Disease Interventions:  (Status:  Goal on track:  Yes.) Long Term Goal ?Assessed the Patient understanding of chronic kidney disease    ?Evaluation of current treatment plan related to chronic kidney disease self management and patient's adherence to plan as established by provider      ?Reviewed prescribed diet continue to drink 48-64 oz of water daily unless otherwise directed  ?Discussed complications of poorly controlled blood pressure such as heart disease, stroke, circulatory complications, vision complications, kidney impairment, sexual dysfunction    ?Provided education on kidney disease progression    ?Last practice recorded BP readings:  ?BP  Readings from Last 3 Encounters:  ?07/08/21 (!) 186/74  ?06/27/21 136/70  ?06/06/21 134/86  ?Most recent eGFR/CrCl:  ?Lab Results  ?Component Value Date  ? EGFR 64 06/06/2021  ?  No components found for: CRCL  ? ?Diabetes Interventions:  (Status:  Goal on track:  Yes.) Long Term Goal ?Assessed patient's understanding of A1c goal:  <5.7 ?Review of patient status, including review of consultants reports, relevant laboratory and other test results, and medications completed ?Educated patient on dietary and exercise  recommendations ?Determined [facility], she is walking and using the noodle in the pool to help tread water for joint strengthening ?Answered patient questions regarding diabet

## 2021-08-18 NOTE — Patient Instructions (Signed)
Visit Information ? ?Thank you for taking time to visit with me today. Please don't hesitate to contact me if I can be of assistance to you before our next scheduled telephone appointment. ? ?Following are the goals we discussed today:  ?(Copy and paste patient goals from clinical care plan here) ? ?Our next appointment is by telephone on 09/12/21 at 12:45 PM ? ?Please call the care guide team at [phone] if you need to cancel or reschedule your appointment.  ? ?If you are experiencing a Mental Health or [place] or need someone to talk to, please call [phone]-TALK (toll free, 24 hour hotline)  ? ?The patient verbalized understanding of instructions, educational materials, and care plan provided today and agreed to receive a mailed copy of patient instructions, educational materials, and care plan.  ? ?Barb Merino, RN, BSN, CCM ?Care Management Coordinator ?[facility]  ?Direct Phone: [phone] ? ? ?

## 2021-08-22 NOTE — Progress Notes (Signed)
?[facility] [place]   ?Telephone:([phone]) [phone] [phone]   ?Clinic Follow up Note  ? ?Patient Care Team: ?Glendale Chard, MD as PCP - General (Internal Medicine) ?Belva Crome, MD as PCP - Cardiology (Cardiology) ?Mauro Kaufmann, RN as Oncology Nurse Navigator ?Rockwell Germany, RN as Oncology Nurse Navigator ?Excell Seltzer, MD (Inactive) as Consulting Physician (General Surgery) ?Truitt Merle, MD as Consulting Physician (Hematology) ?Gery Pray, MD as Consulting Physician (Radiation Oncology) ?Alla Feeling, NP as Nurse Practitioner (Nurse Practitioner) ?Starlyn Skeans as Librarian, academic (Dermatology) ?Little, Claudette Stapler, RN as [place] [facility] ?Mayford Knife, RPH (Pharmacist) ?08/23/2021 ? ?CHIEF COMPLAINT: Follow up right breast cancer  ? ?SUMMARY OF ONCOLOGIC HISTORY: ?Oncology History Overview Note  ?Cancer Staging ?Malignant neoplasm of upper-outer quadrant of right breast in female, estrogen receptor positive ([place]) ?Staging form: Breast, AJCC 8th Edition ?- Clinical stage from 08/04/2018: Stage IA (cT1c, cN0, cM0, G2, ER+, PR+, HER2-) - Signed by Truitt Merle, MD on 08/12/2018 ?- Pathologic stage from 09/02/2018: Stage IA (pT2, pN1a, cM0, G1, ER+, PR+, HER2-) - Signed by Truitt Merle, MD on 09/29/2018 ? ? ?  ?Malignant neoplasm of upper-outer quadrant of right breast in female, estrogen receptor positive ([place])  ?07/29/2018 Mammogram  ? Diagnostic Mammgram 07/29/18  ?IMPRESSION:  ?The 1.7cm oval mass in the right breast upper outer qudrant middle third, 3-5 from nipple is suspicious of malignancy.  ?  ?08/04/2018 Cancer Staging  ? Staging form: Breast, AJCC 8th Edition ?- Clinical stage from 08/04/2018: Stage IA (cT1c, cN0, cM0, G2, ER+, PR+, HER2-) - Signed by Truitt Merle, MD on 08/12/2018 ? ?  ?08/04/2018 Initial Biopsy  ? Diagnosis 08/04/18  ?Breast, right, needle core biopsy, 1.7 cm irregular solid mass at 9 o'clock, 4 cm fn ?- INVASIVE DUCTAL CARCINOMA WITH  EXTRACELLULAR MUCIN. ?  ?08/04/2018 Receptors her2  ? Results: ?IMMUNOHISTOCHEMICAL AND MORPHOMETRIC ANALYSIS PERFORMED MANUALLY ?The tumor cells are NEGATIVE for Her2 (0). ?Estrogen Receptor: 100%, POSITIVE, STRONG STAINING INTENSITY ?Progesterone Receptor: 100%, POSITIVE, STRONG STAINING INTENSITY ?Proliferation Marker Ki67: 10% ?  ?08/11/2018 Initial Diagnosis  ? Malignant neoplasm of upper-outer quadrant of right breast in female, estrogen receptor positive ([place]) ?  ?09/02/2018 Surgery  ? RIGHT BREAST LUMPECTOMY WITH RADIOACTIVE SEED AND  RIGHT SENTINEL LYMPH NODE BIOPSY ?by Dr. Excell Seltzer  ?09/02/18  ?  ?09/02/2018 Pathology Results  ? Diagnosis ?1. Breast, lumpectomy, Right w/seed ?- INVASIVE DUCTAL CARCINOMA WITH EXTRACELLULAR MUCIN, GRADE I, 2.1 CM. ?- DUCTAL CARCINOMA IN SITU, INTERMEDIATE NUCLEAR GRADE. ?- INVASIVE CARCINOMA IS 2 MM FROM SUPERIOR MARGIN AND 2.5 MM FROM LATERAL MARGIN. ?- DUCTAL CARCINOMA IN SITU IS LESS THAN 1 MM FROM ANTERIOR MARGIN, 4 MM FROM SUPERIOR AND ?LATERAL MARGINS, 5 MM FROM INFERIOR MARGIN AND 8 MM FROM THE POSTERIOR MARGIN. ?- SMALL INTRADUCTAL PAPILLOMA. ?- NO EVIDENCE OF LYMPHOVASCULAR OR PERINEURAL INVASION. ?- BIOPSY SITE CHANGES. ?- SEE ONCOLOGY TABLE. ?2. Lymph node, sentinel, biopsy, Right Axillary ?- LYMPH NODE, NEGATIVE FOR CARCINOMA (0/1). ?3. Lymph node, sentinel, biopsy, Right ?- LYMPH NODE, NEGATIVE FOR CARCINOMA (0/1). ?4. Lymph node, sentinel, biopsy, Right ?- METASTATIC BREAST CARCINOMA TO ONE LYMPH NODE (1/1). ?- METASTATIC FOCUS MEASURES 0.4 CM IN GREATEST DIMENSION. ?- NO DEFINITE EVIDENCE OF EXTRANODAL EXTENSION. ?  ?09/02/2018 Miscellaneous  ? MammaPrint  09/02/18 ?Low risk with 10% average 10-year risk of recurrnce untreated  ?Mammaprint Index: +0.287 ?97.8% of patients treated with hormonal therapy have no distnat recurrance.  ?  ?09/02/2018 Cancer Staging  ?  Staging form: Breast, AJCC 8th Edition ?- Pathologic stage from 09/02/2018: Stage IA (pT2, pN1a, cM0, G1, ER+,  PR+, HER2-) - Signed by Truitt Merle, MD on 09/29/2018 ? ?  ?10/05/2018 - 01/2019 Anti-estrogen oral therapy  ? Anastrozole 70m daily starting 10/05/18; stopped 5/18 due to insomnia, hot flashes, depression, and vaginal irritation.  ?I switched her Exemestane in 01/2019. She started having dizziness, hot flashes and joint pain so she stopped two days later.  ?-She tried for no more than 3 weeks total ?  ?11/12/2018 - 12/30/2018 Radiation Therapy  ? RT 11/12/18-12/30/18 with Dr. KEarney Hamburg ?  ?05/14/2019 Survivorship  ? SCP per LCira Rue NP  ?  ?06/2019 - 06/2019 Anti-estrogen oral therapy  ? Tamoxifen 249mdaily starting 06/2019 but stopped after a few days due to dizziness, nausea and fatigue.  ?  ? ? ?CURRENT THERAPY: Surveillance ? ?INTERVAL HISTORY: Ms. [place]raj[place]urns for follow up as scheduled. Last seen by me 02/23/21. DEXA 08/16/21 showed worsening BMD and high FRAX >5% risk for hip fracture.  She goes to the gym 4-5 days/week, walks, exercises in the pool.  She chews a daily calcium and takes D3.  She has arthritic pain in her knees, hips, and left hand index finger.  She denies fall.  She has intermittent short but shooting breast pain, stable over time.  Denies new lump/mass, nipple discharge or inversion, or skin change.  She has intermittent pains on the right side in no specific location, especially when she lies on the right side.  Bowels moving well 3 times per day which is her baseline. ? ?She has not had a recent dental visit (Dr. ReMarian Sorrow ? ?All other systems were reviewed with the patient and are negative. ? ?MEDICAL HISTORY:  ?Past Medical History:  ?Diagnosis Date  ? Anxiety   ? [place]  ? right breast IDC  ? Hyperlipidemia   ? Hypertension   ? OSA (obstructive sleep apnea)   ? NPSG 03/08/2010  AHI 19.8/hr, CPAP nightly  ? Osteopenia   ? Pre-diabetes   ? 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 ? ?SURGICAL HISTORY: ?Past Surgical History:  ?Procedure Laterality Date  ? BREAST LUMPECTOMY WITH RADIOACTIVE SEED AND SENTINEL LYMPH NODE BIOPSY Right 09/02/2018  ? Procedure: RIGHT BREAST LUMPECTOMY WITH RADIOACTIVE SEED AND  RIGHT SENTINEL LYMPH NODE BIOPSY;  Surgeon: HoExcell SeltzerMD;  [facility] Service: General;  Laterality: Right;  ? BRONCHOSCOPY    ? dx'd sarcoid  ? KNEE ARTHROPLASTY Left 03/13/2017  ? NEUROPLASTY / TRANSPOSITION ULNAR NERVE AT ELBOW  09/2011  ? TOTAL ABDOMINAL HYSTERECTOMY    ? ? ?I have reviewed the social history and family history with the patient and they are unchanged from previous note. ? ?ALLERGIES:  is allergic to fosamax [alendronate sodium], omeprazole, motrin [ibuprofen], and fluvastatin. ? ?MEDICATIONS:  ?Current Outpatient Medications  ?Medication Sig Dispense Refill  ? aspirin EC 81 MG tablet Take 81 mg by mouth daily. Swallow whole.    ? calcium carbonate (TUMS - DOSED IN MG ELEMENTAL CALCIUM) 500 MG chewable tablet Chew 1 tablet by mouth as needed.    ? CALCIUM PO Take by mouth daily.    ? Cholecalciferol (VITAMIN D-3 PO) Take 1,000 Units by mouth daily.    ? Cyanocobalamin (VITAMIN B-12 PO)     ? famotidine (PEPCID) 40  MG tablet as needed.    ? felodipine (PLENDIL) 10 MG 24 hr tablet TAKE 1 TABLET BY MOUTH EVERY DAY 90 tablet 2  ? fish oil-omega-3 fatty acids 1000 MG capsule Take 2 capsules by mouth daily.    ? Fluocinolone Acetonide Scalp 0.01 % OIL Apply to affected area of scalp as directed 1-2 x/day as needed for itching 118.28 mL 1  ? GLUCOSAMINE-CHONDROITIN PO Take 2 tablets by mouth daily.    ? L-Methylfolate-Algae-B12-B6 (METANX) 3-90.314-2-35 MG CAPS Take 1 capsule by mouth 2 (two) times daily.     ? Magnesium 250 MG TABS Take by mouth daily. Tues, Thursday, Saturday    ? Menthol, Topical Analgesic, (BIOFREEZE ROLL-ON EX) Apply 1 application topically as needed (pain in back,neck,thumb). (Patient not taking: Reported on 06/06/2021)     ? nitroGLYCERIN (NITROSTAT) 0.4 MG SL tablet Place 1 tablet (0.4 mg total) under the tongue every 5 (five) minutes as needed for chest pain. 25 tablet 2  ? Polyvinyl Alcohol-Povidone PF 1.4-0.6 % SOLN Place 1 d

## 2021-08-23 ENCOUNTER — Other Ambulatory Visit: Payer: Self-pay

## 2021-08-23 ENCOUNTER — Encounter: Payer: Self-pay | Admitting: Nurse Practitioner

## 2021-08-23 ENCOUNTER — Inpatient Hospital Stay: Payer: Medicare PPO | Admitting: Nurse Practitioner

## 2021-08-23 ENCOUNTER — Inpatient Hospital Stay: Payer: Medicare PPO | Attending: Nurse Practitioner

## 2021-08-23 VITALS — BP 163/62 | HR 62 | Temp 97.9°F | Resp 18 | Ht <= 58 in | Wt 148.6 lb

## 2021-08-23 DIAGNOSIS — E119 Type 2 diabetes mellitus without complications: Secondary | ICD-10-CM | POA: Insufficient documentation

## 2021-08-23 DIAGNOSIS — Z170 Estrogen receptor positive status [ER+]: Secondary | ICD-10-CM

## 2021-08-23 DIAGNOSIS — I10 Essential (primary) hypertension: Secondary | ICD-10-CM | POA: Diagnosis not present

## 2021-08-23 DIAGNOSIS — Z923 Personal history of irradiation: Secondary | ICD-10-CM | POA: Diagnosis not present

## 2021-08-23 DIAGNOSIS — B351 Tinea unguium: Secondary | ICD-10-CM | POA: Diagnosis not present

## 2021-08-23 DIAGNOSIS — Z853 Personal history of malignant neoplasm of breast: Secondary | ICD-10-CM | POA: Diagnosis not present

## 2021-08-23 DIAGNOSIS — C50411 Malignant neoplasm of upper-outer quadrant of right female breast: Secondary | ICD-10-CM

## 2021-08-23 DIAGNOSIS — M8580 Other specified disorders of bone density and structure, unspecified site: Secondary | ICD-10-CM | POA: Insufficient documentation

## 2021-08-23 DIAGNOSIS — M722 Plantar fascial fibromatosis: Secondary | ICD-10-CM | POA: Diagnosis not present

## 2021-08-23 DIAGNOSIS — E1142 Type 2 diabetes mellitus with diabetic polyneuropathy: Secondary | ICD-10-CM | POA: Diagnosis not present

## 2021-08-23 DIAGNOSIS — L603 Nail dystrophy: Secondary | ICD-10-CM | POA: Diagnosis not present

## 2021-08-23 DIAGNOSIS — Z7982 Long term (current) use of aspirin: Secondary | ICD-10-CM | POA: Diagnosis not present

## 2021-08-23 DIAGNOSIS — M792 Neuralgia and neuritis, unspecified: Secondary | ICD-10-CM | POA: Diagnosis not present

## 2021-08-23 DIAGNOSIS — Z79899 Other long term (current) drug therapy: Secondary | ICD-10-CM | POA: Diagnosis not present

## 2021-08-23 DIAGNOSIS — G5762 Lesion of plantar nerve, left lower limb: Secondary | ICD-10-CM | POA: Diagnosis not present

## 2021-08-23 DIAGNOSIS — M19079 Primary osteoarthritis, unspecified ankle and foot: Secondary | ICD-10-CM | POA: Diagnosis not present

## 2021-08-23 LAB — CBC WITH DIFFERENTIAL/PLATELET
Abs Immature Granulocytes: 0.01 10*3/uL (ref 0.00–0.07)
Basophils Absolute: 0 10*3/uL (ref 0.0–0.1)
Basophils Relative: 1 %
Eosinophils Absolute: 0.1 10*3/uL (ref 0.0–0.5)
Eosinophils Relative: 4 %
HCT: 35.5 % — ABNORMAL LOW (ref 36.0–46.0)
Hemoglobin: 11.6 g/dL — ABNORMAL LOW (ref 12.0–15.0)
Immature Granulocytes: 0 %
Lymphocytes Relative: 28 %
Lymphs Abs: 1 10*3/uL (ref 0.7–4.0)
MCH: 28.5 pg (ref 26.0–34.0)
MCHC: 32.7 g/dL (ref 30.0–36.0)
MCV: 87.2 fL (ref 80.0–100.0)
Monocytes Absolute: 0.3 10*3/uL (ref 0.1–1.0)
Monocytes Relative: 10 %
Neutro Abs: 2 10*3/uL (ref 1.7–7.7)
Neutrophils Relative %: 57 %
Platelets: 177 10*3/uL (ref 150–400)
RBC: 4.07 MIL/uL (ref 3.87–5.11)
RDW: 14.1 % (ref 11.5–15.5)
WBC: 3.5 10*3/uL — ABNORMAL LOW (ref 4.0–10.5)
nRBC: 0 % (ref 0.0–0.2)

## 2021-08-23 LAB — COMPREHENSIVE METABOLIC PANEL
ALT: 15 U/L (ref 0–44)
AST: 19 U/L (ref 15–41)
Albumin: 4.2 g/dL (ref 3.5–5.0)
Alkaline Phosphatase: 63 U/L (ref 38–126)
Anion gap: 6 (ref 5–15)
BUN: 12 mg/dL (ref 8–23)
CO2: 28 mmol/L (ref 22–32)
Calcium: 9.6 mg/dL (ref 8.9–10.3)
Chloride: 108 mmol/L (ref 98–111)
Creatinine, Ser: 0.9 mg/dL (ref 0.44–1.00)
GFR, Estimated: >60 mL/min (ref >60)
Glucose, Bld: 111 mg/dL — ABNORMAL HIGH (ref 70–99)
Potassium: 3.3 mmol/L — ABNORMAL LOW (ref 3.5–5.1)
Sodium: 142 mmol/L (ref 135–145)
Total Bilirubin: 0.5 mg/dL (ref 0.3–1.2)
Total Protein: 7.6 g/dL (ref 6.5–8.1)

## 2021-08-24 ENCOUNTER — Telehealth: Payer: Self-pay | Admitting: Nurse Practitioner

## 2021-08-24 NOTE — Telephone Encounter (Signed)
Scheduled follow-up appointment per 3/22 los. Patient is aware. ?

## 2021-09-01 DIAGNOSIS — I129 Hypertensive chronic kidney disease with stage 1 through stage 4 chronic kidney disease, or unspecified chronic kidney disease: Secondary | ICD-10-CM | POA: Diagnosis not present

## 2021-09-01 DIAGNOSIS — N182 Chronic kidney disease, stage 2 (mild): Secondary | ICD-10-CM

## 2021-09-01 DIAGNOSIS — E7800 Pure hypercholesterolemia, unspecified: Secondary | ICD-10-CM

## 2021-09-01 DIAGNOSIS — E1122 Type 2 diabetes mellitus with diabetic chronic kidney disease: Secondary | ICD-10-CM

## 2021-09-12 ENCOUNTER — Telehealth: Payer: Medicare PPO

## 2021-09-12 ENCOUNTER — Ambulatory Visit (INDEPENDENT_AMBULATORY_CARE_PROVIDER_SITE_OTHER): Payer: Medicare PPO

## 2021-09-12 DIAGNOSIS — G3184 Mild cognitive impairment, so stated: Secondary | ICD-10-CM

## 2021-09-12 DIAGNOSIS — I129 Hypertensive chronic kidney disease with stage 1 through stage 4 chronic kidney disease, or unspecified chronic kidney disease: Secondary | ICD-10-CM

## 2021-09-12 DIAGNOSIS — E1122 Type 2 diabetes mellitus with diabetic chronic kidney disease: Secondary | ICD-10-CM

## 2021-09-13 NOTE — Chronic Care Management (AMB) (Signed)
?Chronic Care Management  ? ?CCM RN Visit Note ? ?09/13/2021 ?Name: Mariah Burns MRN: [id number] DOB: 12-21-1945 ? ?Subjective: ?Mariah Burns is a 76 y.o. year old female who is a primary care patient of Glendale Chard, MD. The care management team was consulted for assistance with disease management and care coordination needs.   ? ?Engaged with patient by telephone for follow up visit in response to provider referral for case management and/or care coordination services.  ? ?Consent to Services:  ?The patient was given information about Chronic Care Management services, agreed to services, and gave verbal consent prior to initiation of services.  Please see initial visit note for detailed documentation.  ? ?Patient agreed to services and verbal consent obtained.  ? ?Assessment: Review of patient past medical history, allergies, medications, health status, including review of consultants reports, laboratory and other test data, was performed as part of comprehensive evaluation and provision of chronic care management services.  ? ?SDOH (Social Determinants of Health) assessments and interventions performed:  Yes, no acute challenges  ? ?CCM Care Plan ? ?Allergies  ?Allergen Reactions  ? Fosamax [Alendronate Sodium] Other (See Comments)  ?  REACTION: "CAUSE GI UPSET AND FATIGUE"  ? Omeprazole Other (See Comments)  ?  Lower ab pain  ? Motrin [Ibuprofen] Other (See Comments)  ?  GI upset- has to eat prior to taking  ? Fluvastatin Other (See Comments)  ?  mild memoryproblems of confusion   ? ? ?Outpatient Encounter Medications as of 09/12/2021  ?Medication Sig  ? aspirin EC 81 MG tablet Take 81 mg by mouth daily. Swallow whole.  ? calcium carbonate (TUMS - DOSED IN MG ELEMENTAL CALCIUM) 500 MG chewable tablet Chew 1 tablet by mouth as needed.  ? CALCIUM PO Take by mouth daily.  ? Cholecalciferol (VITAMIN D-3 PO) Take 1,000 Units by mouth daily.  ? Cyanocobalamin (VITAMIN B-12 PO)   ? famotidine (PEPCID) 40 MG  tablet as needed.  ? felodipine (PLENDIL) 10 MG 24 hr tablet TAKE 1 TABLET BY MOUTH EVERY DAY  ? fish oil-omega-3 fatty acids 1000 MG capsule Take 2 capsules by mouth daily.  ? Fluocinolone Acetonide Scalp 0.01 % OIL Apply to affected area of scalp as directed 1-2 x/day as needed for itching  ? GLUCOSAMINE-CHONDROITIN PO Take 2 tablets by mouth daily.  ? L-Methylfolate-Algae-B12-B6 (METANX) 3-90.314-2-35 MG CAPS Take 1 capsule by mouth 2 (two) times daily.   ? Magnesium 250 MG TABS Take by mouth daily. Tues, Thursday, Saturday  ? Menthol, Topical Analgesic, (BIOFREEZE ROLL-ON EX) Apply 1 application topically as needed (pain in back,neck,thumb). (Patient not taking: Reported on 06/06/2021)  ? nitroGLYCERIN (NITROSTAT) 0.4 MG SL tablet Place 1 tablet (0.4 mg total) under the tongue every 5 (five) minutes as needed for chest pain.  ? Polyvinyl Alcohol-Povidone PF 1.4-0.6 % SOLN Place 1 drop into both eyes daily as needed (dry eyes).   ? pravastatin (PRAVACHOL) 80 MG tablet Take 1 tablet (80 mg total) by mouth every evening. Take one tablet by mouth on Monday Wednesday and Friday.  ? TART CHERRY PO   ? ?Facility-Administered Encounter Medications as of 09/12/2021  ?Medication  ? 0.9 %  sodium chloride infusion  ? ? ?Patient Active Problem List  ? Diagnosis Date Noted  ? Chronic diastolic heart failure ([place]) 06/06/2021  ? Insomnia 03/30/2021  ? Pure hypercholesterolemia 03/07/2021  ? Reactive depression 11/19/2020  ? Dry mouth 11/19/2020  ? Memory loss 11/19/2020  ? 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 Class 1 obesity due to excess calories with serious comorbidity and body mass index (BMI) of 30.0 to 30.9 in adult 10/28/2019  ? Vitamin D deficiency disease 10/28/2019  ? Chronic bilateral low back pain without sciatica 10/28/2019  ? Hip pain, bilateral 10/28/2019  ? Malignant neoplasm of upper-outer quadrant of right breast in female, estrogen receptor  positive ([place]) 08/11/2018  ? Chronic renal disease, stage II 05/15/2018  ? Hypertensive nephropathy 05/15/2018  ? Adult BMI 29.0-29.9 kg/sq m 05/15/2018  ? Dyspnea on exertion 02/17/2015  ? Incomplete right bundle branch block 02/17/2015  ? Systolic murmur 51/07/5850  ? Pericardial effusion 02/17/2015  ? Osteopenia 10/05/2011  ? Type 2 diabetes mellitus with stage 2 chronic kidney disease, without long-term current use of insulin ([place]) 05/06/2010  ? SARCOIDOSIS 03/21/2007  ? HYPERLIPIDEMIA 03/21/2007  ? Obstructive sleep apnea 03/21/2007  ? Essential hypertension 03/21/2007  ? ? ?Conditions to be addressed/monitored: DM II, Hypertensive Nephropathy, HLD, Mild Cognitive Impairment  ? ?Care Plan : RN Care Manager plan of care  ?Updates made by Lynne Logan, RN since 09/13/2021 12:00 AM  ?  ? ?Problem: No plan of care established for management of chronic disease states (DM II, Hypertensive Nephropathy, HLD, Mild Cognitive Impairment)   ?Priority: High  ?  ? ?Long-Range Goal: Development of plan of care for management of chronic disease management (DM II, Hypertensive Nephropathy, HLD)   ?Start Date: 05/17/2021  ?Expected End Date: 05/17/2022  ?Recent Progress: On track  ?Priority: High  ?Note:   ?Current Barriers:  ?Knowledge Deficits related to plan of care for management of DM II, Hypertensive Nephropathy, HLD, Mild Cognitive Impairment, Mild Cognitive Impairment  ?Chronic Disease Management support and education needs related to DM II, Hypertensive Nephropathy, HLD, Mild Cognitive Impairment  ? ?RNCM Clinical Goal(s):  ?Patient will verbalize basic understanding of  DM II, Hypertensive Nephropathy, HLD) disease process and self health management plan as evidenced by patient will report having no disease exacerbations related to her chronic disease states as listed above  ?demonstrate Improved health management independence as evidenced by patient will report 100% adherence to her prescribed treatment plan   ?continue to work with RN Care Manager to address care management and care coordination needs related to  DM II, Hypertensive Nephropathy, HLD, Mild Cognitive Impairment as evidenced by adherence to CM Team Scheduled appointments ?demonstrate ongoing self health care management ability   as evidenced by    through collaboration with RN Care manager, provider, and care team.  ? ?Interventions: ?1:1 collaboration with primary care provider regarding development and update of comprehensive plan of care as evidenced by provider attestation and co-signature ?Inter-disciplinary care team collaboration (see longitudinal plan of care) ?Evaluation of current treatment plan related to  self management and patient's adherence to plan as established by provider ? ? Chronic Kidney Disease Interventions:  (Status:  Goal on track:  Yes.) Long Term Goal ?Assessed the Patient understanding of chronic kidney disease    ?Evaluation of current treatment plan related to chronic kidney disease self management and patient's adherence to plan as established by provider      ?Reviewed prescribed diet continue to drink 48-64 oz of water daily unless otherwise directed  ?Advised patient, providing education and rationale, to monitor blood pressure daily and record, calling PCP for findings outside established parameters    ?Provided education on kidney disease progression    ?Last practice recorded BP readings:  ?  BP Readings from Last 3 Encounters:  ?08/23/21 (!) 163/62  ?07/08/21 (!) 186/74  ?06/27/21 136/70  ?Most recent eGFR/CrCl:  ?Lab Results  ?Component Value Date  ? EGFR 64 06/06/2021  ?  No components found for: CRCL ? ? ?Diabetes Interventions:  (Status:  Condition stable.  Not addressed this visit.) Long Term Goal ?Assessed patient's understanding of A1c goal:  <5.7 ?Review of patient status, including review of consultants reports, relevant laboratory and other test results, and medications completed ?Educated patient on dietary  and exercise recommendations ?[facility], she is walking and using the noodle in the pool to help tread water for joint strengthening ?Answered patient questions regardi

## 2021-09-13 NOTE — Patient Instructions (Signed)
Visit Information ? ?Thank you for taking time to visit with me today. Please don't hesitate to contact me if I can be of assistance to you before our next scheduled telephone appointment. ? ?Following are the goals we discussed today:  ?(Copy and paste patient goals from clinical care plan here) ? ?Our next appointment is by telephone on 10/10/21 at 11:15 AM  ? ?Please call the care guide team at [phone] if you need to cancel or reschedule your appointment.  ? ?If you are experiencing a Mental Health or [place] or need someone to talk to, please call [phone]-TALK (toll free, 24 hour hotline)  ? ?The patient verbalized understanding of instructions, educational materials, and care plan provided today and agreed to receive a mailed copy of patient instructions, educational materials, and care plan.  ? ?Barb Merino, RN, BSN, CCM ?Care Management Coordinator ?[facility]  ?Direct Phone: [phone] ? ? ?

## 2021-10-01 DIAGNOSIS — I129 Hypertensive chronic kidney disease with stage 1 through stage 4 chronic kidney disease, or unspecified chronic kidney disease: Secondary | ICD-10-CM | POA: Diagnosis not present

## 2021-10-01 DIAGNOSIS — N182 Chronic kidney disease, stage 2 (mild): Secondary | ICD-10-CM | POA: Diagnosis not present

## 2021-10-01 DIAGNOSIS — E1122 Type 2 diabetes mellitus with diabetic chronic kidney disease: Secondary | ICD-10-CM

## 2021-10-05 ENCOUNTER — Ambulatory Visit: Payer: Medicare PPO | Admitting: Internal Medicine

## 2021-10-05 ENCOUNTER — Encounter: Payer: Self-pay | Admitting: Internal Medicine

## 2021-10-05 VITALS — BP 142/70 | HR 63 | Temp 97.7°F | Ht <= 58 in | Wt 146.0 lb

## 2021-10-05 DIAGNOSIS — Z6830 Body mass index (BMI) 30.0-30.9, adult: Secondary | ICD-10-CM

## 2021-10-05 DIAGNOSIS — G3184 Mild cognitive impairment, so stated: Secondary | ICD-10-CM

## 2021-10-05 DIAGNOSIS — C773 Secondary and unspecified malignant neoplasm of axilla and upper limb lymph nodes: Secondary | ICD-10-CM

## 2021-10-05 DIAGNOSIS — E6609 Other obesity due to excess calories: Secondary | ICD-10-CM

## 2021-10-05 DIAGNOSIS — N182 Chronic kidney disease, stage 2 (mild): Secondary | ICD-10-CM | POA: Diagnosis not present

## 2021-10-05 DIAGNOSIS — E1122 Type 2 diabetes mellitus with diabetic chronic kidney disease: Secondary | ICD-10-CM

## 2021-10-05 DIAGNOSIS — E7800 Pure hypercholesterolemia, unspecified: Secondary | ICD-10-CM

## 2021-10-05 DIAGNOSIS — Z23 Encounter for immunization: Secondary | ICD-10-CM

## 2021-10-05 DIAGNOSIS — I129 Hypertensive chronic kidney disease with stage 1 through stage 4 chronic kidney disease, or unspecified chronic kidney disease: Secondary | ICD-10-CM

## 2021-10-05 NOTE — Patient Instructions (Signed)

## 2021-10-05 NOTE — Progress Notes (Signed)
I,Tianna Badgett,acting as a Education administrator for [name], MD.,have documented all relevant documentation on the behalf of [name], MD,as directed by  [name], MD while in the presence of [name], MD.  This visit occurred during the SARS-CoV-2 public health emergency.  Safety protocols were in place, including screening questions prior to the visit, additional usage of staff PPE, and extensive cleaning of exam room while observing appropriate contact time as indicated for disinfecting solutions.  Subjective:     Patient ID: Mariah Burns , female    DOB: [birth year] , 76 y.o.   MRN: [id number]   Chief Complaint  Patient presents with   Diabetes   Hypertension    HPI  The patient is here today for a diabetes and blood pressure f/u.  She reports compliance with meds. She denies headaches, chest pain and shortness of breath.   Patient was advised to bring personal blood pressure cuff from home but states that she forgot.   Diabetes She presents for her follow-up diabetic visit. She has type 2 diabetes mellitus. Her disease course has been stable. Pertinent negatives for diabetes include no blurred vision. There are no hypoglycemic complications. Diabetic complications include nephropathy. Risk factors for coronary artery disease include diabetes mellitus, dyslipidemia, hypertension, sedentary lifestyle and post-menopausal. She is compliant with treatment most of the time.  Hypertension This is a chronic problem. The current episode started more than 1 year ago. The problem has been gradually improving since onset. The problem is controlled. Pertinent negatives include no blurred vision.    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: failed Imuran & Prednisone therapy d/t adverse side effects TLC 106%, DLCO 75% 2009     Family History  Problem Relation Age of Onset   Allergies Sister    Heart disease Sister    Lupus Sister    Allergies Mother    Allergies Daughter    Healthy Daughter    Heart disease Sister    Gout Brother    Hypertension Brother    Heart disease Brother    Heart failure Father    Gout Brother    Hypertension Brother    Hypertension Brother    Hypertension Brother    Post-traumatic stress disorder Brother    Healthy Daughter      Current Outpatient Medications:    aspirin EC 81 MG tablet, Take 81 mg by mouth daily. Swallow whole., Disp: , Rfl:    calcium carbonate (TUMS - DOSED IN MG ELEMENTAL CALCIUM) 500 MG chewable tablet, Chew 1 tablet by mouth as needed., Disp: , Rfl:    CALCIUM PO, Take by mouth daily., Disp: , Rfl:    Cholecalciferol (VITAMIN D-3 PO), Take 1,000 Units by mouth daily., Disp: , Rfl:    Cyanocobalamin (VITAMIN B-12 PO), , Disp: , Rfl:    famotidine (PEPCID) 40 MG tablet, as needed., Disp: , Rfl:    felodipine (PLENDIL) 10 MG 24 hr tablet, TAKE 1 TABLET BY MOUTH EVERY DAY, Disp: 90 tablet, Rfl: 2   fish oil-omega-3 fatty acids 1000 MG capsule, Take 2 capsules by mouth daily., Disp: , Rfl:  Fluocinolone Acetonide Scalp 0.01 % OIL, Apply to affected area of scalp as directed 1-2 x/day as needed for itching, Disp: 118.28 mL, Rfl: 1   GLUCOSAMINE-CHONDROITIN PO, Take 2 tablets by mouth daily., Disp: , Rfl:    L-Methylfolate-Algae-B12-B6 (METANX) 3-90.314-2-35 MG CAPS, Take 1 capsule by mouth 2 (two) times daily. , Disp: , Rfl:    Magnesium 250 MG TABS, Take by mouth daily. Tues, Thursday, Saturday, Disp: , Rfl:    Menthol, Topical Analgesic, (BIOFREEZE ROLL-ON EX), Apply 1 application. topically as needed (pain in back,neck,thumb)., Disp: , Rfl:    nitroGLYCERIN (NITROSTAT) 0.4 MG SL tablet, Place 1 tablet (0.4 mg total) under the tongue every 5 (five)  minutes as needed for chest pain., Disp: 25 tablet, Rfl: 2   Polyvinyl Alcohol-Povidone PF 1.4-0.6 % SOLN, Place 1 drop into both eyes daily as needed (dry eyes). , Disp: , Rfl:    pravastatin (PRAVACHOL) 80 MG tablet, Take 1 tablet (80 mg total) by mouth every evening. Take one tablet by mouth on Monday Wednesday and Friday., Disp: 45 tablet, Rfl: 3   TART CHERRY PO, , Disp: , Rfl:   Current Facility-Administered Medications:    0.9 %  sodium chloride infusion, , Intravenous, PRN, Minette Brine, FNP   Allergies  Allergen Reactions   Fosamax [Alendronate Sodium] Other (See Comments)    REACTION: "CAUSE GI UPSET AND FATIGUE"   Omeprazole Other (See Comments)    Lower ab pain   Motrin [Ibuprofen] Other (See Comments)    GI upset- has to eat prior to taking   Fluvastatin Other (See Comments)    mild memoryproblems of confusion      Review of Systems  Constitutional: Negative.   Eyes:  Negative for blurred vision.  Respiratory: Negative.    Cardiovascular: Negative.   Gastrointestinal: Negative.   Neurological: Negative.     Today's Vitals   10/05/21 1200  BP: (!) 142/70  Pulse: 63  Temp: 97.7 F (36.5 C)  TempSrc: Oral  Weight: 146 lb (66.2 kg)  Height: '4\' 10"'$  (1.473 m)   Body mass index is 30.51 kg/m.  Wt Readings from Last 3 Encounters:  10/05/21 146 lb (66.2 kg)  08/23/21 148 lb 9.6 oz (67.4 kg)  07/07/21 148 lb (67.1 kg)    Objective:  Physical Exam Vitals and nursing note reviewed.  Constitutional:      Appearance: Normal appearance.  HENT:     Head: Normocephalic and atraumatic.  Cardiovascular:     Rate and Rhythm: Normal rate and regular rhythm.     Heart sounds: Normal heart sounds.  Pulmonary:     Effort: Pulmonary effort is normal.     Breath sounds: Normal breath sounds.  Musculoskeletal:     Cervical back: Normal range of motion.  Skin:    General: Skin is warm.  Neurological:     General: No focal deficit present.     Mental Status: She is  alert.  Psychiatric:        Mood and Affect: Mood normal.        Behavior: Behavior normal.     Assessment And Plan:     1. Type 2 diabetes mellitus with stage 2 chronic kidney disease, without long-term current use of insulin (HCC) Comments: Chronic, I will check labs as below. She is encouraged to limit her intake of refined carbs including sugary foods and drinks.  - Hemoglobin A1c  2. Hypertensive nephropathy Comments: Chronic, fair control. I will not change meds today.  Advised to limit her intake of packaged foods. Agrees to f/u w/ nurse visit and bring her home cuff.   3. Pure hypercholesterolemia Comments: Chronic, has had side effects with high-intensity statins. She will c/w pravastatin '80mg'$  MWF dosing for now. LDL goal <70.   4. Mild cognitive impairment Comments: She does not wish to start medication.   5. Class 1 obesity due to excess calories with serious comorbidity and body mass index (BMI) of 30.0 to 30.9 in adult Comments: She is encouraged to aim for at least 150 minutes of exercise per week.  6. Immunization due - Varicella-zoster vaccine IM (Shingrix)   Patient was given opportunity to ask questions. Patient verbalized understanding of the plan and was able to repeat key elements of the plan. All questions were answered to their satisfaction.   I, [name], MD, have reviewed all documentation for this visit. The documentation on 10/05/21 for the exam, diagnosis, procedures, and orders are all accurate and complete.   IF YOU HAVE BEEN REFERRED TO A SPECIALIST, IT MAY TAKE 1-2 WEEKS TO SCHEDULE/PROCESS THE REFERRAL. IF YOU HAVE NOT HEARD FROM US/SPECIALIST IN TWO WEEKS, PLEASE GIVE [place] A CALL AT [phone] X [phone].   THE PATIENT IS ENCOURAGED TO PRACTICE SOCIAL DISTANCING DUE TO THE COVID-19 PANDEMIC.

## 2021-10-06 LAB — HEMOGLOBIN A1C
Est. average glucose Bld gHb Est-mCnc: 120 mg/dL
Hgb A1c MFr Bld: 5.8 % — ABNORMAL HIGH (ref 4.8–5.6)

## 2021-10-07 DIAGNOSIS — Z1231 Encounter for screening mammogram for malignant neoplasm of breast: Secondary | ICD-10-CM | POA: Diagnosis not present

## 2021-10-07 LAB — HM MAMMOGRAPHY

## 2021-10-09 ENCOUNTER — Telehealth: Payer: Self-pay

## 2021-10-09 NOTE — Chronic Care Management (AMB) (Signed)
Chronic Care Management Pharmacy Assistant   Name: Mariah Burns  MRN: [id number] DOB: Jul 27, 1945   Reason for Encounter: Disease State/ Hypertension  Recent office visits:  10-05-2021 Glendale Chard, MD. A1C= 5.8. Shingrix given.  09-12-2021 Little, Claudette Stapler, RN (CCM).  08-18-2021 Little, Claudette Stapler, RN (CCM).  Recent consult visits:  08-23-2021 Alla Feeling, NP (Oncology). Order placed for MM DIAG BREAST TOMO BILATERAL.  08-23-2021 Antionette Poles, MD (Foot/ankle). Visit for toenail pain.  Hospital visits:  Medication Reconciliation was completed by comparing discharge summary, patient's EMR and Pharmacy list, and upon discussion with patient.  Admitted to the hospital on 07-07-2021 due to chest pain. Discharge date was 07-08-2021. Discharged from [place]?Medications Started at [facility] Discharge:?? None  Medication Changes at Hospital Discharge: None  Medications Discontinued at Hospital Discharge: None  Medications that remain the same after Hospital Discharge:??  -All other medications will remain the same.    Medications: Outpatient Encounter Medications as of 10/09/2021  Medication Sig   aspirin EC 81 MG tablet Take 81 mg by mouth daily. Swallow whole.   calcium carbonate (TUMS - DOSED IN MG ELEMENTAL CALCIUM) 500 MG chewable tablet Chew 1 tablet by mouth as needed.   CALCIUM PO Take by mouth daily.   Cholecalciferol (VITAMIN D-3 PO) Take 1,000 Units by mouth daily.   Cyanocobalamin (VITAMIN B-12 PO)    famotidine (PEPCID) 40 MG tablet as needed.   felodipine (PLENDIL) 10 MG 24 hr tablet TAKE 1 TABLET BY MOUTH EVERY DAY   fish oil-omega-3 fatty acids 1000 MG capsule Take 2 capsules by mouth daily.   Fluocinolone Acetonide Scalp 0.01 % OIL Apply to affected area of scalp as directed 1-2 x/day as needed for itching   GLUCOSAMINE-CHONDROITIN PO Take 2 tablets by mouth daily.   L-Methylfolate-Algae-B12-B6 (METANX) 3-90.314-2-35 MG CAPS  Take 1 capsule by mouth 2 (two) times daily.    Magnesium 250 MG TABS Take by mouth daily. Tues, Thursday, Saturday   Menthol, Topical Analgesic, (BIOFREEZE ROLL-ON EX) Apply 1 application. topically as needed (pain in back,neck,thumb).   nitroGLYCERIN (NITROSTAT) 0.4 MG SL tablet Place 1 tablet (0.4 mg total) under the tongue every 5 (five) minutes as needed for chest pain.   Polyvinyl Alcohol-Povidone PF 1.4-0.6 % SOLN Place 1 drop into both eyes daily as needed (dry eyes).    pravastatin (PRAVACHOL) 80 MG tablet Take 1 tablet (80 mg total) by mouth every evening. Take one tablet by mouth on Monday Wednesday and Friday.   TART CHERRY PO    Facility-Administered Encounter Medications as of 10/09/2021  Medication   0.9 %  sodium chloride infusion   Reviewed chart prior to disease state call. Spoke with patient regarding BP  Recent Office Vitals: BP Readings from Last 3 Encounters:  10/05/21 (!) 142/70  08/23/21 (!) 163/62  07/08/21 (!) 186/74   Pulse Readings from Last 3 Encounters:  10/05/21 63  08/23/21 62  07/08/21 63    Wt Readings from Last 3 Encounters:  10/05/21 146 lb (66.2 kg)  08/23/21 148 lb 9.6 oz (67.4 kg)  07/07/21 148 lb (67.1 kg)     Kidney Function Lab Results  Component Value Date/Time   CREATININE 0.90 08/23/2021 10:14 AM   CREATININE 0.79 07/07/2021 09:28 PM   CREATININE 1.07 (H) 08/11/2020 02:14 PM   CREATININE 0.94 08/12/2019 10:35 AM   GFRNONAA >60 08/23/2021 10:14 AM   GFRNONAA 54 (L) 08/11/2020 02:14 PM   GFRAA 85  05/26/2020 12:32 PM   GFRAA >60 08/12/2019 10:35 AM       Latest Ref Rng & Units 08/23/2021   10:14 AM 07/07/2021    9:28 PM 06/06/2021   12:10 PM  BMP  Glucose 70 - 99 mg/dL 111   109   95    BUN 8 - 23 mg/dL 12   17   11     Creatinine 0.44 - 1.00 mg/dL 0.90   0.79   0.93    BUN/Creat Ratio 12 - 28   12    Sodium 135 - 145 mmol/L 142   139   145    Potassium 3.5 - 5.1 mmol/L 3.3   3.6   4.1    Chloride 98 - 111 mmol/L 108   105    107    CO2 22 - 32 mmol/L 28   26   26     Calcium 8.9 - 10.3 mg/dL 9.6   9.5   9.6      Current antihypertensive regimen:  Felodipine 10 mg daily  How often are you checking your Blood Pressure? 1-2x per week  Current home BP readings: 151/67 54, 155/71 55, 150/66 57  What recent interventions/DTPs have been made by any provider to improve Blood Pressure control since last CPP Visit:  Educated on Daily salt intake goal < 2300 mg; Exercise goal of 150 minutes per week; Importance of home blood pressure monitoring; -Patient reports that her BP is over 43 years old  -Recommended patient use OTC book to get a new BP cuff  -Counseled to monitor BP at home at least once per day, document, and provide log at future appointments -Recommended to continue current medication  Any recent hospitalizations or ED visits since last visit with CPP? Yes  What diet changes have been made to improve Blood Pressure Control?  Patient stated she has limited salt intake, eats fruit everyday and drinks plenty of water.  What exercise is being done to improve your Blood Pressure Control?  Patient states she walks daily, uses stationary bike.  Adherence Review: Is the patient currently on ACE/ARB medication? No Does the patient have >5 day gap between last estimated fill dates? No  Care Gaps: Covid booster overdue Yearly ophthalmology overdue AWV 12-06-2021  Star Rating Drugs: Pravastatin 80 mg- Lst filled 09-29-2021 90 [facility]  [place] Clinical Pharmacist Assistant [phone]

## 2021-10-10 ENCOUNTER — Ambulatory Visit (INDEPENDENT_AMBULATORY_CARE_PROVIDER_SITE_OTHER): Payer: Medicare PPO

## 2021-10-10 ENCOUNTER — Telehealth: Payer: Medicare PPO

## 2021-10-10 DIAGNOSIS — E7800 Pure hypercholesterolemia, unspecified: Secondary | ICD-10-CM

## 2021-10-10 DIAGNOSIS — I129 Hypertensive chronic kidney disease with stage 1 through stage 4 chronic kidney disease, or unspecified chronic kidney disease: Secondary | ICD-10-CM

## 2021-10-10 DIAGNOSIS — G3184 Mild cognitive impairment, so stated: Secondary | ICD-10-CM

## 2021-10-10 DIAGNOSIS — E1122 Type 2 diabetes mellitus with diabetic chronic kidney disease: Secondary | ICD-10-CM

## 2021-10-10 NOTE — Chronic Care Management (AMB) (Signed)
?Chronic Care Management  ? ?CCM RN Visit Note ? ?10/10/2021 ?Name: Mariah Burns MRN: [id number] DOB: Jan 04, 1946 ? ?Subjective: ?Mariah Burns is a 76 y.o. year old female who is a primary care patient of Glendale Chard, MD. The care management team was consulted for assistance with disease management and care coordination needs.   ? ?Engaged with patient by telephone for follow up visit in response to provider referral for case management and/or care coordination services.  ? ?Consent to Services:  ?The patient was given information about Chronic Care Management services, agreed to services, and gave verbal consent prior to initiation of services.  Please see initial visit note for detailed documentation.  ? ?Patient agreed to services and verbal consent obtained.  ? ?Assessment: Review of patient past medical history, allergies, medications, health status, including review of consultants reports, laboratory and other test data, was performed as part of comprehensive evaluation and provision of chronic care management services.  ? ?SDOH (Social Determinants of Health) assessments and interventions performed:   ? ?CCM Care Plan ? ?Allergies  ?Allergen Reactions  ? Fosamax [Alendronate Sodium] Other (See Comments)  ?  REACTION: "CAUSE GI UPSET AND FATIGUE"  ? Omeprazole Other (See Comments)  ?  Lower ab pain  ? Motrin [Ibuprofen] Other (See Comments)  ?  GI upset- has to eat prior to taking  ? Fluvastatin Other (See Comments)  ?  mild memoryproblems of confusion   ? ? ?Outpatient Encounter Medications as of 10/10/2021  ?Medication Sig  ? aspirin EC 81 MG tablet Take 81 mg by mouth daily. Swallow whole.  ? calcium carbonate (TUMS - DOSED IN MG ELEMENTAL CALCIUM) 500 MG chewable tablet Chew 1 tablet by mouth as needed.  ? CALCIUM PO Take by mouth daily.  ? Cholecalciferol (VITAMIN D-3 PO) Take 1,000 Units by mouth daily.  ? Cyanocobalamin (VITAMIN B-12 PO)   ? famotidine (PEPCID) 40 MG tablet as needed.  ?  felodipine (PLENDIL) 10 MG 24 hr tablet TAKE 1 TABLET BY MOUTH EVERY DAY  ? fish oil-omega-3 fatty acids 1000 MG capsule Take 2 capsules by mouth daily.  ? Fluocinolone Acetonide Scalp 0.01 % OIL Apply to affected area of scalp as directed 1-2 x/day as needed for itching  ? GLUCOSAMINE-CHONDROITIN PO Take 2 tablets by mouth daily.  ? L-Methylfolate-Algae-B12-B6 (METANX) 3-90.314-2-35 MG CAPS Take 1 capsule by mouth 2 (two) times daily.   ? Magnesium 250 MG TABS Take by mouth daily. Tues, Thursday, Saturday  ? Menthol, Topical Analgesic, (BIOFREEZE ROLL-ON EX) Apply 1 application. topically as needed (pain in back,neck,thumb).  ? nitroGLYCERIN (NITROSTAT) 0.4 MG SL tablet Place 1 tablet (0.4 mg total) under the tongue every 5 (five) minutes as needed for chest pain.  ? Polyvinyl Alcohol-Povidone PF 1.4-0.6 % SOLN Place 1 drop into both eyes daily as needed (dry eyes).   ? pravastatin (PRAVACHOL) 80 MG tablet Take 1 tablet (80 mg total) by mouth every evening. Take one tablet by mouth on Monday Wednesday and Friday.  ? TART CHERRY PO   ? ?Facility-Administered Encounter Medications as of 10/10/2021  ?Medication  ? 0.9 %  sodium chloride infusion  ? ? ?Patient Active Problem List  ? Diagnosis Date Noted  ? Chronic diastolic heart failure ([place]) 06/06/2021  ? Insomnia 03/30/2021  ? Pure hypercholesterolemia 03/07/2021  ? Reactive depression 11/19/2020  ? Dry mouth 11/19/2020  ? Memory loss 11/19/2020  ? 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  Class 1 obesity due to excess calories with serious comorbidity and body mass index (BMI) of 30.0 to 30.9 in adult 10/28/2019  ? Vitamin D deficiency disease 10/28/2019  ? Chronic bilateral low back pain without sciatica 10/28/2019  ? Hip pain, bilateral 10/28/2019  ? Malignant neoplasm of upper-outer quadrant of right breast in female, estrogen receptor positive ([place]) 08/11/2018  ? Chronic renal disease, stage II  05/15/2018  ? Hypertensive nephropathy 05/15/2018  ? Adult BMI 29.0-29.9 kg/sq m 05/15/2018  ? Dyspnea on exertion 02/17/2015  ? Incomplete right bundle branch block 02/17/2015  ? Systolic murmur 85/07/7739  ? Pericardial effusion 02/17/2015  ? Osteopenia 10/05/2011  ? Type 2 diabetes mellitus with stage 2 chronic kidney disease, without long-term current use of insulin ([place]) 05/06/2010  ? SARCOIDOSIS 03/21/2007  ? HYPERLIPIDEMIA 03/21/2007  ? Obstructive sleep apnea 03/21/2007  ? Essential hypertension 03/21/2007  ? ? ?Conditions to be addressed/monitored: DM II, Hypertensive Nephropathy, HLD, Mild Cognitive Impairment  ? ?Care Plan : RN Care Manager plan of care  ?Updates made by Lynne Logan, RN since 10/10/2021 12:00 AM  ?  ? ?Problem: No plan of care established for management of chronic disease states (DM II, Hypertensive Nephropathy, HLD, Mild Cognitive Impairment)   ?Priority: High  ?  ? ?Long-Range Goal: Development of plan of care for management of chronic disease management (DM II, Hypertensive Nephropathy, HLD)   ?Start Date: 05/17/2021  ?Expected End Date: 05/17/2022  ?Recent Progress: On track  ?Priority: High  ?Note:   ?Current Barriers:  ?Knowledge Deficits related to plan of care for management of DM II, Hypertensive Nephropathy, HLD, Mild Cognitive Impairment, Mild Cognitive Impairment  ?Chronic Disease Management support and education needs related to DM II, Hypertensive Nephropathy, HLD, Mild Cognitive Impairment  ? ?RNCM Clinical Goal(s):  ?Patient will verbalize basic understanding of  DM II, Hypertensive Nephropathy, HLD) disease process and self health management plan as evidenced by patient will report having no disease exacerbations related to her chronic disease states as listed above  ?demonstrate Improved health management independence as evidenced by patient will report 100% adherence to her prescribed treatment plan  ?continue to work with RN Care Manager to address care management  and care coordination needs related to  DM II, Hypertensive Nephropathy, HLD, Mild Cognitive Impairment as evidenced by adherence to CM Team Scheduled appointments ?demonstrate ongoing self health care management ability   as evidenced by    through collaboration with RN Care manager, provider, and care team.  ? ?Interventions: ?1:1 collaboration with primary care provider regarding development and update of comprehensive plan of care as evidenced by provider attestation and co-signature ?Inter-disciplinary care team collaboration (see longitudinal plan of care) ?Evaluation of current treatment plan related to  self management and patient's adherence to plan as established by provider ? ? Chronic Kidney Disease Interventions:  (Status:  Condition stable.  Not addressed this visit.) Long Term Goal ?Assessed the Patient understanding of chronic kidney disease    ?Evaluation of current treatment plan related to chronic kidney disease self management and patient's adherence to plan as established by provider      ?Reviewed prescribed diet continue to drink 48-64 oz of water daily unless otherwise directed  ?Advised patient, providing education and rationale, to monitor blood pressure daily and record, calling PCP for findings outside established parameters    ?Provided education on kidney disease progression    ?Last practice recorded BP readings:  ?BP Readings from Last 3 Encounters:  ?08/23/21 (!) 163/62  ?  07/08/21 (!) 186/74  ?06/27/21 136/70  ?Most recent eGFR/CrCl:  ?Lab Results  ?Component Value Date  ? EGFR 64 06/06/2021  ?  No components found for: CRCL ? ?Diabetes Interventions:  (Status:  Goal on track:  Yes.) Long Term Goal ?Assessed patient's understanding of A1c goal:  <5.7% ?Provided education to patient about basic DM disease process ?Review of patient status, including review of consultants reports, relevant laboratory and other test results, and medications completed ?Assessed social determinant of  health barriers ?Educated patient on dietary and exercise recommendations ?Mailed printed educational material related to Carb Choices ?Lab Results  ?Component Value Date  ? HGBA1C 5.8 (H) 10/05/2021  ?  ?  ?Mild Cognitive

## 2021-10-10 NOTE — Patient Instructions (Signed)
Visit Information ? ?Thank you for taking time to visit with me today. Please don't hesitate to contact me if I can be of assistance to you before our next scheduled telephone appointment. ? ?Following are the goals we discussed today:  ?(Copy and paste patient goals from clinical care plan here) ? ?Our next appointment is by telephone on 04/04/22 at 11:15 AM ? ?Please call the care guide team at [phone] if you need to cancel or reschedule your appointment.  ? ?If you are experiencing a Mental Health or Waucoma or need someone to talk to, please call [phone]-TALK (toll free, 24 hour hotline)  ? ?The patient verbalized understanding of instructions, educational materials, and care plan provided today and agreed to receive a mailed copy of patient instructions, educational materials, and care plan.  ? ?Barb Merino, RN, BSN, CCM ?Care Management Coordinator ?[facility]  ?Direct Phone: [phone] ? ? ?

## 2021-10-18 DIAGNOSIS — M722 Plantar fascial fibromatosis: Secondary | ICD-10-CM | POA: Diagnosis not present

## 2021-10-18 DIAGNOSIS — E1142 Type 2 diabetes mellitus with diabetic polyneuropathy: Secondary | ICD-10-CM | POA: Diagnosis not present

## 2021-10-18 DIAGNOSIS — B351 Tinea unguium: Secondary | ICD-10-CM | POA: Diagnosis not present

## 2021-10-18 DIAGNOSIS — M19079 Primary osteoarthritis, unspecified ankle and foot: Secondary | ICD-10-CM | POA: Diagnosis not present

## 2021-10-18 DIAGNOSIS — G5762 Lesion of plantar nerve, left lower limb: Secondary | ICD-10-CM | POA: Diagnosis not present

## 2021-10-18 DIAGNOSIS — L603 Nail dystrophy: Secondary | ICD-10-CM | POA: Diagnosis not present

## 2021-10-18 DIAGNOSIS — M792 Neuralgia and neuritis, unspecified: Secondary | ICD-10-CM | POA: Diagnosis not present

## 2021-10-22 DIAGNOSIS — G3184 Mild cognitive impairment, so stated: Secondary | ICD-10-CM | POA: Insufficient documentation

## 2021-10-31 DIAGNOSIS — H2513 Age-related nuclear cataract, bilateral: Secondary | ICD-10-CM | POA: Diagnosis not present

## 2021-10-31 DIAGNOSIS — H43813 Vitreous degeneration, bilateral: Secondary | ICD-10-CM | POA: Diagnosis not present

## 2021-10-31 DIAGNOSIS — E119 Type 2 diabetes mellitus without complications: Secondary | ICD-10-CM | POA: Diagnosis not present

## 2021-10-31 DIAGNOSIS — H35033 Hypertensive retinopathy, bilateral: Secondary | ICD-10-CM | POA: Diagnosis not present

## 2021-10-31 DIAGNOSIS — H16223 Keratoconjunctivitis sicca, not specified as Sjogren's, bilateral: Secondary | ICD-10-CM | POA: Diagnosis not present

## 2021-10-31 LAB — HM DIABETES EYE EXAM

## 2021-11-01 DIAGNOSIS — E1122 Type 2 diabetes mellitus with diabetic chronic kidney disease: Secondary | ICD-10-CM | POA: Diagnosis not present

## 2021-11-01 DIAGNOSIS — N182 Chronic kidney disease, stage 2 (mild): Secondary | ICD-10-CM | POA: Diagnosis not present

## 2021-11-01 DIAGNOSIS — E785 Hyperlipidemia, unspecified: Secondary | ICD-10-CM | POA: Diagnosis not present

## 2021-11-01 DIAGNOSIS — I129 Hypertensive chronic kidney disease with stage 1 through stage 4 chronic kidney disease, or unspecified chronic kidney disease: Secondary | ICD-10-CM

## 2021-11-29 DIAGNOSIS — E1142 Type 2 diabetes mellitus with diabetic polyneuropathy: Secondary | ICD-10-CM | POA: Diagnosis not present

## 2021-11-29 DIAGNOSIS — M19079 Primary osteoarthritis, unspecified ankle and foot: Secondary | ICD-10-CM | POA: Diagnosis not present

## 2021-11-29 DIAGNOSIS — G5762 Lesion of plantar nerve, left lower limb: Secondary | ICD-10-CM | POA: Diagnosis not present

## 2021-11-29 DIAGNOSIS — M792 Neuralgia and neuritis, unspecified: Secondary | ICD-10-CM | POA: Diagnosis not present

## 2021-11-29 DIAGNOSIS — L603 Nail dystrophy: Secondary | ICD-10-CM | POA: Diagnosis not present

## 2021-11-29 DIAGNOSIS — M722 Plantar fascial fibromatosis: Secondary | ICD-10-CM | POA: Diagnosis not present

## 2021-11-29 DIAGNOSIS — B351 Tinea unguium: Secondary | ICD-10-CM | POA: Diagnosis not present

## 2021-12-02 IMAGING — RF DG ESOPHAGUS
12 series · 14 of 24 positions shown · non-contrast
Comparison: 01/21/2012

CLINICAL DATA: Dysphagia

EXAM:
ESOPHOGRAM / BARIUM SWALLOW / BARIUM TABLET STUDY
TECHNIQUE: Combined double contrast and single contrast examination performed
using effervescent crystals, thick barium liquid, and thin barium
liquid. The patient was observed with fluoroscopy swallowing a 13 mm
barium sulphate tablet.
FLUOROSCOPY TIME:  Fluoroscopy Time:
Radiation Exposure Index (if provided by the fluoroscopic device):
443 mGy
Number of Acquired Spot Images: 0

[Series 1: sequence · 0.28mm/px · 1 of 20 frames shown (1 of 12)]
[frame 4/20]
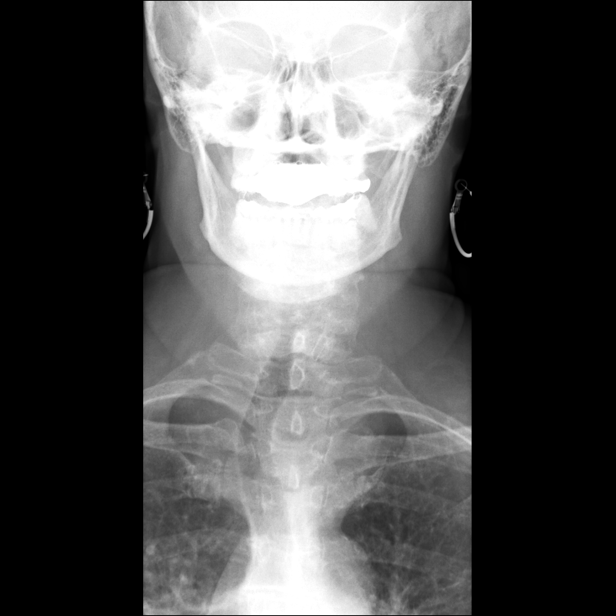

[Series 2: sequence · 0.28mm/px · 1 of 16 frames shown (2 of 12)]
[frame 3/16]
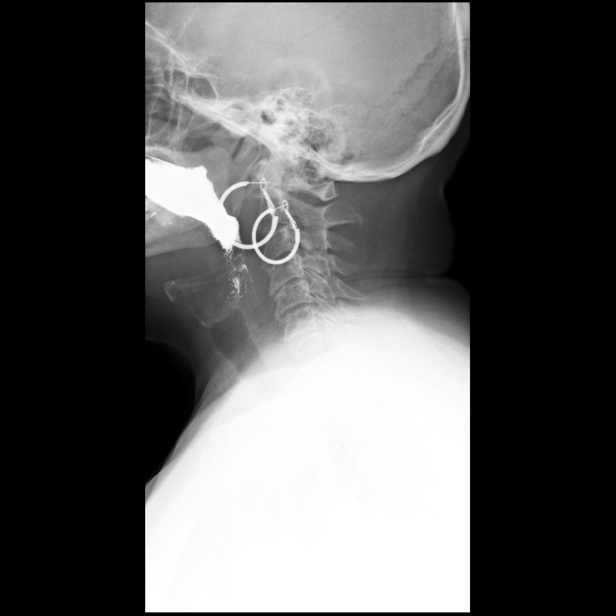

[Series 3: sequence · 0.28mm/px · 1 of 29 frames shown (3 of 12)]
[frame 15/29]
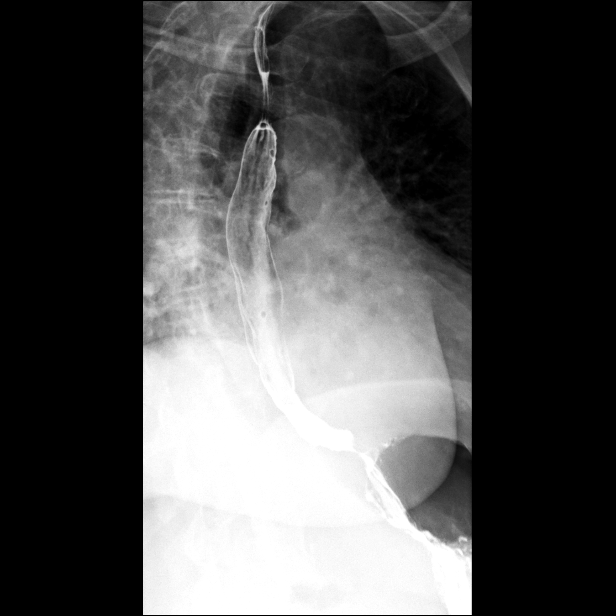

[Series 5: sequence · 2 of 91 frames shown (4 of 12)]
[frame 1/91]
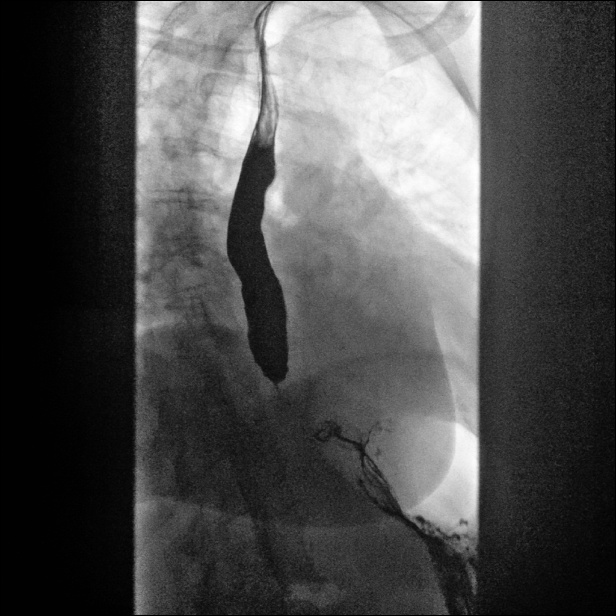
[frame 46/91]
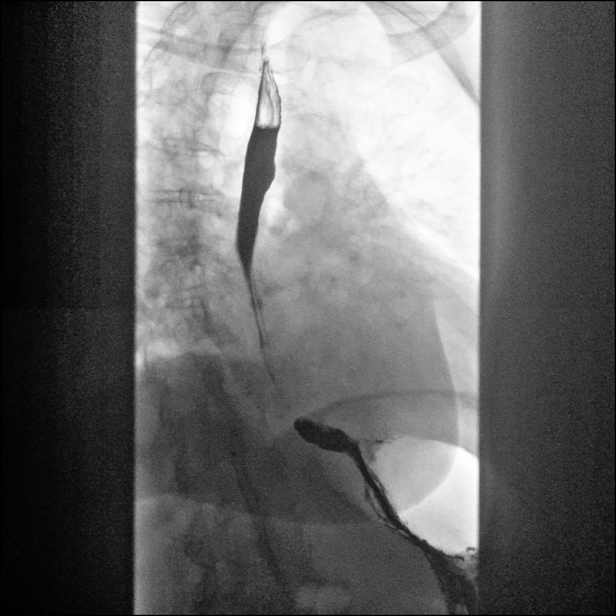

[Series 6: sequence · 1 of 78 frames shown (5 of 12)]
[frame 53/78]
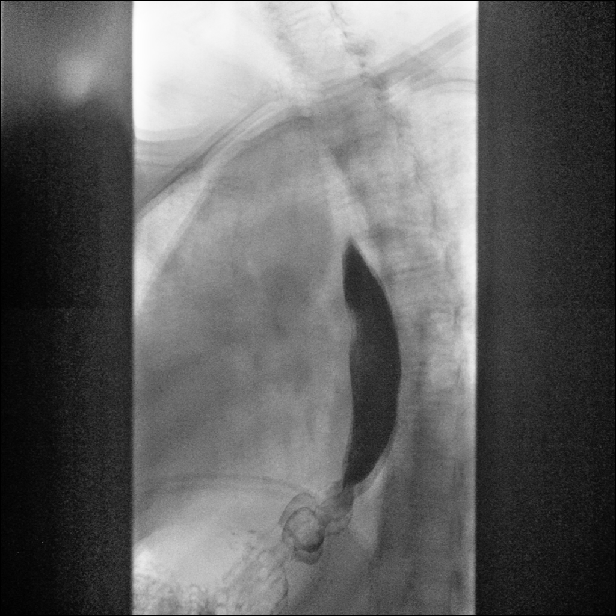

[Series 7: sequence · 2 of 41 frames shown (6 of 12)]
[frame 21/41]
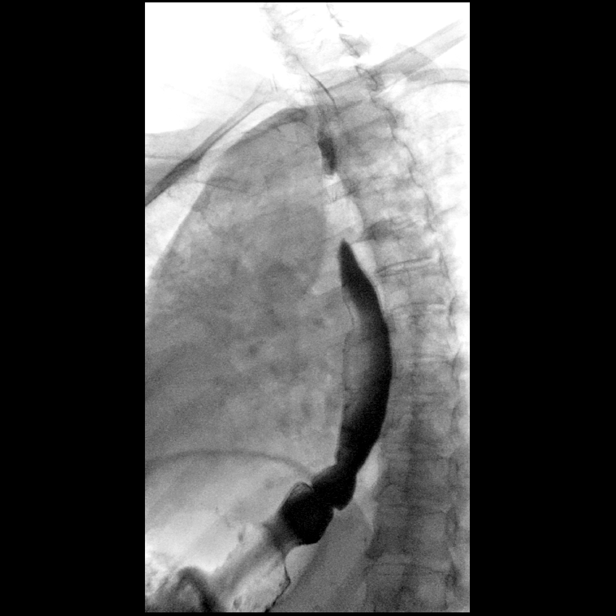
[frame 35/41]
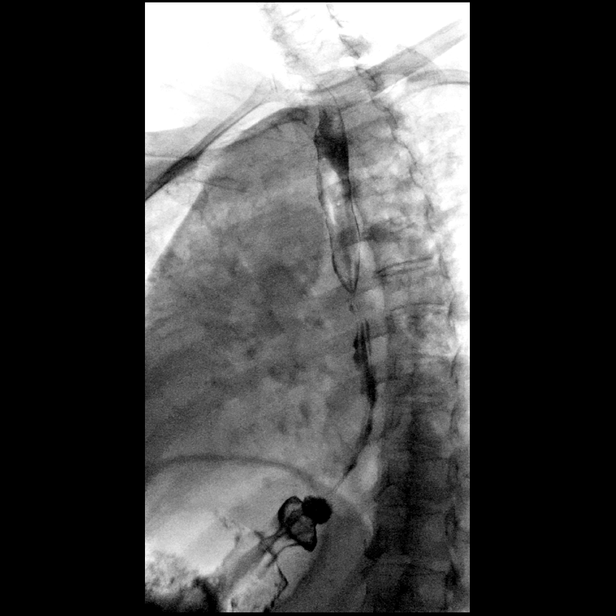

[Series 8: sequence · 1 of 52 frames shown (7 of 12)]
[frame 45/52]
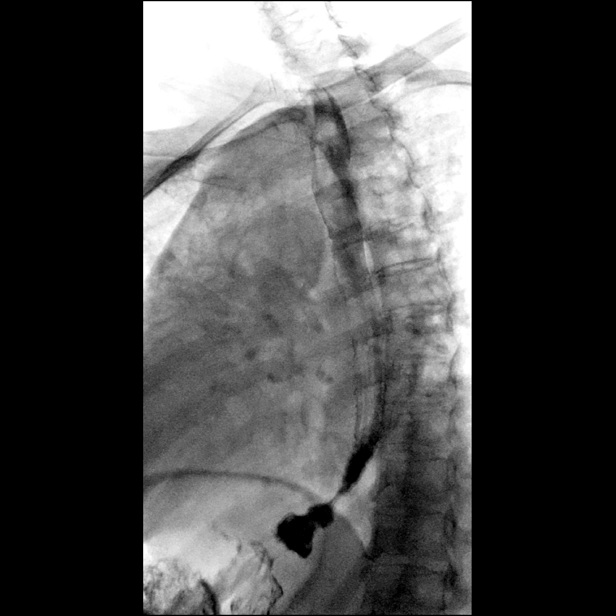

[Series 9: sequence · 1 of 32 frames shown (8 of 12)]
[frame 28/32]
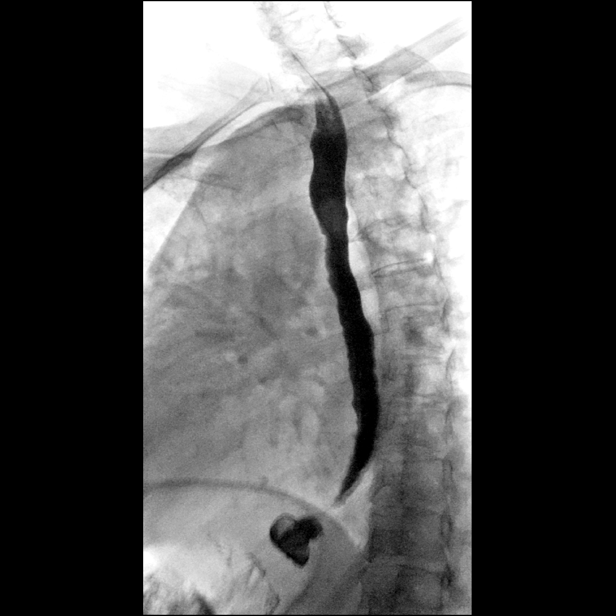

[Series 10: sequence · 1 of 55 frames shown (9 of 12)]
[frame 47/55]
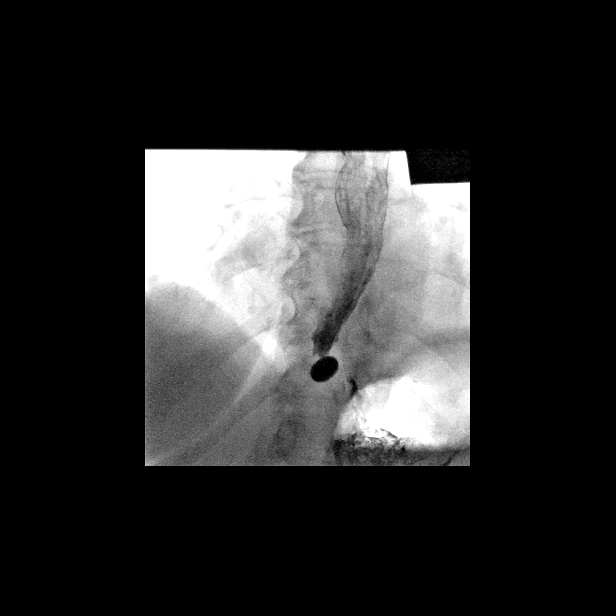

[Series 11: sequence · 1 of 14 frames shown (10 of 12)]
[frame 4/14]
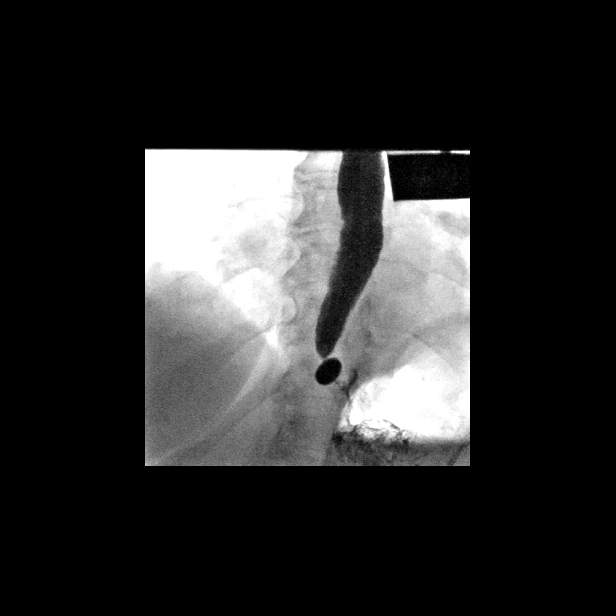

[Series 12: sequence · 1 of 18 frames shown (11 of 12)]
[frame 10/18]
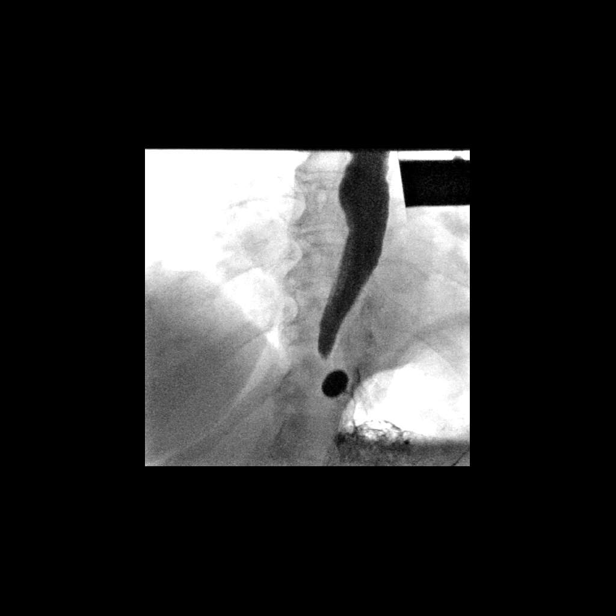

[Series 13: sequence · 1 of 6 frames shown (12 of 12)]
[frame 6/6]
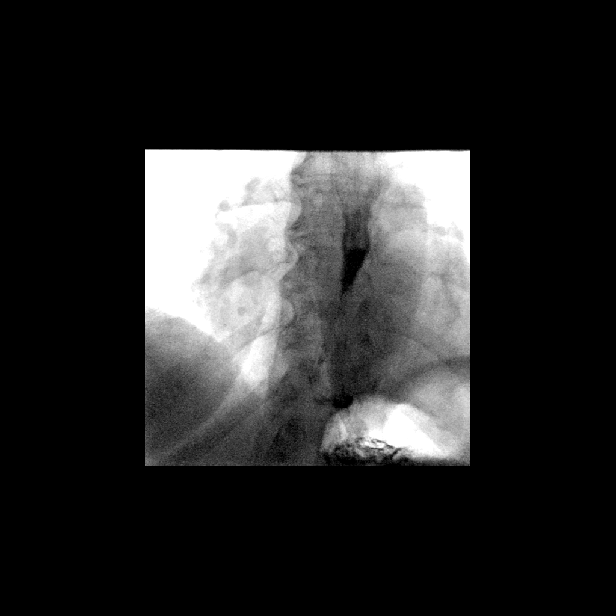

[14 of 24 positions shown; findings below may reference images not displayed]

FINDINGS: Frontal and lateral views of the hypopharynx while swallowing thick
barium are unremarkable.

Double contrast imaging of the esophagus shows no evidence for mass
lesion, diverticulum, or gross mucosal ulceration.

Evaluation of esophageal motility reveals disruption of primary
peristalsis in the mid esophagus on multiple swallows. There is a
tiny hiatal hernia with associated mild Schatzki ring. Mild tertiary
contractions noted distal esophagus without evidence of overt
presbyesophagus.

13 mm barium tablet lodges transiently at the level of the Schatzki
ring but does subsequently pass into the stomach with repeated
swallows of thin barium and water.
IMPRESSION: 1. Small hiatal hernia.
2. Mild Schatzki ring. 13 mm barium tablet transiently lodges at the
ring but does subsequently pass into the stomach with repeated
swallows of water and thin barium.
3. Nonspecific esophageal motility disorder.

## 2021-12-04 ENCOUNTER — Telehealth: Payer: Self-pay

## 2021-12-04 NOTE — Chronic Care Management (AMB) (Signed)
Chronic Care Management Pharmacy Assistant   Name: KAILIANA GRANQUIST  MRN: [id number] DOB: [birth year]  Reason for Encounter: Disease State/ Hypertension  Recent office visits:  10-10-2021 Lynne Logan, RN (CCM)  Recent consult visits:  11-29-2021 Antionette Poles DPM (Foot/ankle). Follow up visit.  10-18-2021 Antionette Poles DPM (Foot/ankle). Follow up visit.  Hospital visits:  Medication Reconciliation was completed by comparing discharge summary, patient's EMR and Pharmacy list, and upon discussion with patient.   Admitted to the hospital on 07-07-2021 due to chest pain. Discharge date was 07-08-2021. Discharged from [facility]?Medications Started at [facility] Discharge:?? None   Medication Changes at Hospital Discharge: None   Medications Discontinued at Hospital Discharge: None   Medications that remain the same after Hospital Discharge:??  -All other medications will remain the same.      Medications: Outpatient Encounter Medications as of 12/04/2021  Medication Sig   aspirin EC 81 MG tablet Take 81 mg by mouth daily. Swallow whole.   calcium carbonate (TUMS - DOSED IN MG ELEMENTAL CALCIUM) 500 MG chewable tablet Chew 1 tablet by mouth as needed.   CALCIUM PO Take by mouth daily.   Cholecalciferol (VITAMIN D-3 PO) Take 1,000 Units by mouth daily.   Cyanocobalamin (VITAMIN B-12 PO)    famotidine (PEPCID) 40 MG tablet as needed.   felodipine (PLENDIL) 10 MG 24 hr tablet TAKE 1 TABLET BY MOUTH EVERY DAY   fish oil-omega-3 fatty acids 1000 MG capsule Take 2 capsules by mouth daily.   Fluocinolone Acetonide Scalp 0.01 % OIL Apply to affected area of scalp as directed 1-2 x/day as needed for itching   GLUCOSAMINE-CHONDROITIN PO Take 2 tablets by mouth daily.   L-Methylfolate-Algae-B12-B6 (METANX) 3-90.314-2-35 MG CAPS Take 1 capsule by mouth 2 (two) times daily.    Magnesium 250 MG TABS Take by mouth daily. Tues, Thursday, Saturday   Menthol,  Topical Analgesic, (BIOFREEZE ROLL-ON EX) Apply 1 application. topically as needed (pain in back,neck,thumb).   nitroGLYCERIN (NITROSTAT) 0.4 MG SL tablet Place 1 tablet (0.4 mg total) under the tongue every 5 (five) minutes as needed for chest pain.   Polyvinyl Alcohol-Povidone PF 1.4-0.6 % SOLN Place 1 drop into both eyes daily as needed (dry eyes).    pravastatin (PRAVACHOL) 80 MG tablet Take 1 tablet (80 mg total) by mouth every evening. Take one tablet by mouth on Monday Wednesday and Friday.   TART CHERRY PO    Facility-Administered Encounter Medications as of 12/04/2021  Medication   0.9 %  sodium chloride infusion  Reviewed chart prior to disease state call. Spoke with patient regarding BP  Recent Office Vitals: BP Readings from Last 3 Encounters:  10/05/21 (!) 142/70  08/23/21 (!) 163/62  07/08/21 (!) 186/74   Pulse Readings from Last 3 Encounters:  10/05/21 63  08/23/21 62  07/08/21 63    Wt Readings from Last 3 Encounters:  10/05/21 146 lb (66.2 kg)  08/23/21 148 lb 9.6 oz (67.4 kg)  07/07/21 148 lb (67.1 kg)     Kidney Function Lab Results  Component Value Date/Time   CREATININE 0.90 08/23/2021 10:14 AM   CREATININE 0.79 07/07/2021 09:28 PM   CREATININE 1.07 (H) 08/11/2020 02:14 PM   CREATININE 0.94 08/12/2019 10:35 AM   GFRNONAA >60 08/23/2021 10:14 AM   GFRNONAA 54 (L) 08/11/2020 02:14 PM   GFRAA 85 05/26/2020 12:32 PM   GFRAA >60 08/12/2019 10:35 AM       Latest Ref  Rng & Units 08/23/2021   10:14 AM 07/07/2021    9:28 PM 06/06/2021   12:10 PM  BMP  Glucose 70 - 99 mg/dL 111  109  95   BUN 8 - 23 mg/dL '12  17  11   '$ Creatinine 0.44 - 1.00 mg/dL 0.90  0.79  0.93   BUN/Creat Ratio 12 - 28   12   Sodium 135 - 145 mmol/L 142  139  145   Potassium 3.5 - 5.1 mmol/L 3.3  3.6  4.1   Chloride 98 - 111 mmol/L 108  105  107   CO2 22 - 32 mmol/L '28  26  26   '$ Calcium 8.9 - 10.3 mg/dL 9.6  9.5  9.6     Current antihypertensive regimen:  Felodipine 10 mg daily  How  often are you checking your Blood Pressure? 1-2x per week  Current home BP readings: 161/77 57, 158/77 53, 152/81 60  What recent interventions/DTPs have been made by any provider to improve Blood Pressure control since last CPP Visit:  Educated on Daily salt intake goal < 2300 mg; Exercise goal of 150 minutes per week; Importance of home blood pressure monitoring; -Patient reports that her BP is over 77 years old  -Recommended patient use OTC book to get a new BP cuff  -Counseled to monitor BP at home at least once per day, document, and provide log at future appointments -Recommended to continue current medication  Any recent hospitalizations or ED visits since last visit with CPP? Yes  What diet changes have been made to improve Blood Pressure Control?  Patient stated she has limited salt intake, eats fruit everyday and drinks plenty of water.  What exercise is being done to improve your Blood Pressure Control?  Patient states she walks daily and uses stationary bike.  Adherence Review: Is the patient currently on ACE/ARB medication? Yes Does the patient have >5 day gap between last estimated fill dates? No   Care Gaps: Covid booster overdue Yearly ophthalmology overdue AWV 12-06-2021  Star Rating Drugs: Pravastatin 80 mg- Last filled 09-29-2021 [facility]  [place] [facility] Clinical Pharmacist Assistant [phone]

## 2021-12-06 ENCOUNTER — Ambulatory Visit (INDEPENDENT_AMBULATORY_CARE_PROVIDER_SITE_OTHER): Payer: Medicare PPO

## 2021-12-06 ENCOUNTER — Ambulatory Visit: Payer: Medicare PPO | Admitting: Internal Medicine

## 2021-12-06 VITALS — BP 130/62 | HR 55 | Temp 98.5°F | Ht 59.2 in | Wt 143.2 lb

## 2021-12-06 DIAGNOSIS — Z0000 Encounter for general adult medical examination without abnormal findings: Secondary | ICD-10-CM

## 2021-12-06 NOTE — Progress Notes (Signed)
Subjective:   Mariah Burns is a 76 y.o. female who presents for Medicare Annual (Subsequent) preventive examination.  Review of Systems     Cardiac Risk Factors include: advanced age (>13mn, >>13women);diabetes mellitus;dyslipidemia;hypertension     Objective:    Today's Vitals   12/06/21 0935  BP: 130/62  Pulse: (!) 55  Temp: 98.5 F (36.9 C)  TempSrc: Oral  SpO2: 97%  Weight: 143 lb 3.2 oz (65 kg)  Height: 4' 11.2" (1.504 m)   Body mass index is 28.73 kg/m.     12/06/2021    9:47 AM 07/07/2021    9:47 PM 02/02/2021    4:38 PM 01/03/2021   10:06 AM 11/09/2020   12:40 PM 10/28/2019   10:24 AM 08/12/2019   10:58 AM  Advanced Directives  Does Patient Have a Medical Advance Directive? Yes No No Yes Yes Yes Yes  Type of AParamedicof ATas[place]Living will   [place] BeachLiving will [place]ountain [place] AcresLiving will [place] will   Does patient want to make changes to medical advance directive?    No - Patient declined     Copy of HArmstrongin Chart? No - copy requested   No - copy requested No - copy requested No - copy requested   Would patient like information on creating a medical advance directive?  No - Patient declined         Current Medications (verified) Outpatient Encounter Medications as of 12/06/2021  Medication Sig   aspirin EC 81 MG tablet Take 81 mg by mouth daily. Swallow whole.   CALCIUM PO Take by mouth daily.   Cholecalciferol (VITAMIN D-3 PO) Take 1,000 Units by mouth daily.   Cyanocobalamin (VITAMIN B-12 PO)    felodipine (PLENDIL) 10 MG 24 hr tablet TAKE 1 TABLET BY MOUTH EVERY DAY   fish oil-omega-3 fatty acids 1000 MG capsule Take 2 capsules by mouth daily.   Fluocinolone Acetonide Scalp 0.01 % OIL Apply to affected area of scalp as directed 1-2 x/day as needed for itching   GLUCOSAMINE-CHONDROITIN PO Take 2 tablets by mouth daily.    L-Methylfolate-Algae-B12-B6 (METANX) 3-90.314-2-35 MG CAPS Take 1 capsule by mouth 2 (two) times daily.    Magnesium 250 MG TABS Take by mouth daily. Tues, Thursday, Saturday   Menthol, Topical Analgesic, (BIOFREEZE ROLL-ON EX) Apply 1 application. topically as needed (pain in back,neck,thumb).   nitroGLYCERIN (NITROSTAT) 0.4 MG SL tablet Place 1 tablet (0.4 mg total) under the tongue every 5 (five) minutes as needed for chest pain.   Polyvinyl Alcohol-Povidone PF 1.4-0.6 % SOLN Place 1 drop into both eyes daily as needed (dry eyes).    pravastatin (PRAVACHOL) 80 MG tablet Take 1 tablet (80 mg total) by mouth every evening. Take one tablet by mouth on Monday Wednesday and Friday.   TART CHERRY PO    calcium carbonate (TUMS - DOSED IN MG ELEMENTAL CALCIUM) 500 MG chewable tablet Chew 1 tablet by mouth as needed. (Patient not taking: Reported on 12/06/2021)   famotidine (PEPCID) 40 MG tablet as needed. 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 d/t adverse side effects TLC 106%, DLCO 75% 2009   Past Surgical History:  Procedure Laterality Date   BREAST LUMPECTOMY WITH RADIOACTIVE SEED AND SENTINEL LYMPH NODE BIOPSY Right 09/02/2018   Procedure: RIGHT BREAST LUMPECTOMY WITH RADIOACTIVE SEED AND  RIGHT SENTINEL LYMPH NODE BIOPSY;  Surgeon: Excell Seltzer, MD;  Location: [facility];  Service: General;  Laterality: Right;   BRONCHOSCOPY      dx'd sarcoid   KNEE ARTHROPLASTY Left 03/13/2017   NEUROPLASTY / TRANSPOSITION ULNAR NERVE AT ELBOW  09/2011   TOTAL ABDOMINAL HYSTERECTOMY     Family History  Problem Relation Age of Onset   Allergies Sister    Heart disease Sister    Lupus Sister    Allergies Mother    Allergies Daughter    Healthy Daughter    Heart disease Sister    Gout Brother    Hypertension Brother    Heart disease Brother    Heart failure Father    Gout Brother    Hypertension Brother    Hypertension Brother    Hypertension Brother    Post-traumatic stress disorder Brother    Healthy Daughter    Social History   Socioeconomic History   Marital status: Divorced    Spouse name: Not on file   Number of children: 3   Years of education: College   Highest education level: Not on file  Occupational History   Occupation: Retired    Fish farm manager: RETIRED  Tobacco Use   Smoking status: Never   Smokeless tobacco: Never  Vaping Use   Vaping Use: Never used  Substance and Sexual Activity   Alcohol use: No   Drug use: No   Sexual activity: Not Currently  Other Topics Concern   Not on file  Social History Narrative   05/23/21 Patient lives at home with youngest daughter.   Caffeine Use: none   Social Determinants of Health   Financial Resource Strain: Low Risk  (12/06/2021)   Overall Financial Resource Strain (CARDIA)    Difficulty of Paying Living Expenses: Not hard at all  Food Insecurity: No Food Insecurity (12/06/2021)   Hunger Vital Sign    Worried About Running Out of Food in the Last Year: Never true    Ran Out of Food in the Last Year: Never true  Transportation Needs: No Transportation Needs (12/06/2021)   PRAPARE - Hydrologist (Medical): No    Lack of Transportation (Non-Medical): No  Physical Activity: Sufficiently Active (12/06/2021)   Exercise Vital Sign    Days of Exercise per Week: 5 days    Minutes of Exercise per Session: 60 min  Stress: No Stress  Concern Present (12/06/2021)   [place]    Feeling of Stress : Not at all  Social Connections: Not on file    Tobacco Counseling Counseling given: Not Answered   Clinical Intake:  Pre-visit preparation completed: Yes  Pain : No/denies pain     Nutritional Status: BMI 25 -29 Overweight Nutritional Risks: None Diabetes: No  How often do you need to have someone help you when you read instructions, pamphlets, or other written materials from your doctor or pharmacy?: 1 -  Never What is the last grade level you completed in school?: college  Diabetic? Yes Nutrition Risk Assessment:  Has the patient had any N/V/D within the last 2 months?  No  Does the patient have any non-healing wounds?  No  Has the patient had any unintentional weight loss or weight gain?  No   Diabetes:  Is the patient diabetic?  Yes  If diabetic, was a CBG obtained today?  No  Did the patient bring in their glucometer from home?  No  How often do you monitor your CBG's? Does not.   Financial Strains and Diabetes Management:  Are you having any financial strains with the device, your supplies or your medication? No .  Does the patient want to be seen by Chronic Care Management for management of their diabetes?  No  Would the patient like to be referred to a Nutritionist or for Diabetic Management?  No   Diabetic Exams:  Diabetic Eye Exam: Overdue for diabetic eye exam. Pt has been advised about the importance in completing this exam. Patient advised to call and schedule an eye exam. Diabetic Foot Exam: Completed 03/01/2021   Interpreter Needed?: No  Information entered by :: NAllen LPN   Activities of Daily Living    12/06/2021    9:48 AM  In your present state of health, do you have any difficulty performing the following activities:  Hearing? 0  Comment sometimes  Vision? 0  Difficulty concentrating or making decisions? 1   Walking or climbing stairs? 0  Dressing or bathing? 0  Doing errands, shopping? 0  Preparing Food and eating ? N  Using the Toilet? N  In the past six months, have you accidently leaked urine? Y  Do you have problems with loss of bowel control? N  Managing your Medications? N  Managing your Finances? N  Housekeeping or managing your Housekeeping? N    Patient Care Team: Glendale Chard, MD as PCP - General (Internal Medicine) Belva Crome, MD as PCP - Cardiology (Cardiology) Mauro Kaufmann, RN as Oncology Nurse Navigator Rockwell Germany, RN as Oncology Nurse Navigator Excell Seltzer, MD (Inactive) as Consulting Physician (General Surgery) Truitt Merle, MD as Consulting Physician (Hematology) Gery Pray, MD as Consulting Physician (Radiation Oncology) Alla Feeling, NP as Nurse Practitioner (Nurse Practitioner) Warren Danes, PA-C as Physician Assistant (Dermatology) Lynne Logan, RN as [place] Management Pearson, Sharyn Blitz, [facility] (Pharmacist) Marylynn Pearson, MD as Consulting Physician (Ophthalmology)  Indicate any recent Medical Services you may have received from other than [facility] providers in the past year (date may be approximate).     Assessment:   This is a routine wellness examination for [facility].  Hearing/Vision screen Vision Screening - Comments:: Regular eye exams, Dr. Venetia Maxon  Dietary issues and exercise activities discussed: Current Exercise Habits: Home exercise routine, Type of exercise: walking;treadmill (water), Time (Minutes): 60, Frequency (Times/Week): 5, Weekly Exercise (Minutes/Week): 300   Goals Addressed             This Visit's Progress    Patient Stated       12/06/2021, wants to eat healthy       Depression Screen    12/06/2021    9:48 AM 10/05/2021   11:58 AM 06/06/2021   11:09 AM 02/14/2021    8:50 AM 11/09/2020   12:42 PM 11/02/2020    9:24 AM 10/28/2019   10:26 AM  PHQ 2/9 Scores  PHQ - 2 Score 0 0  4 0 '3 6  1  '$ PHQ- 9 Score  0 '18 2 4 20 4    '$ Fall Risk    12/06/2021    9:48 AM 10/05/2021   11:59 AM 11/09/2020   12:41 PM 11/02/2020    9:28 AM 10/28/2019   10:25 AM  Fall Risk   Falls in the past year? 0 0 0 0 0  Number falls in past yr: 0 0     Injury with Fall? 0 0     Risk for fall due to : Medication side effect  Medication side effect  Medication side effect  Follow up Falls evaluation completed;Education provided;Falls prevention discussed  Falls evaluation completed;Education provided;Falls prevention discussed  Falls evaluation completed;Education provided;Falls prevention discussed    FALL RISK PREVENTION PERTAINING TO THE HOME:  Any stairs in or around the home? Yes  If so, are there any without handrails? No  Home free of loose throw rugs in walkways, pet beds, electrical cords, etc? Yes  Adequate lighting in your home to reduce risk of falls? Yes   ASSISTIVE DEVICES UTILIZED TO PREVENT FALLS:  Life alert? No  Use of a cane, walker or w/c? No  Grab bars in the bathroom? No  Shower chair or bench in shower? No  Elevated toilet seat or a handicapped toilet? Yes   TIMED UP AND GO:  Was the test performed? No .     Gait slow and steady without use of assistive device  Cognitive Function:    05/23/2021   10:21 AM  MMSE - Mini Mental State Exam  Orientation to time 5  Orientation to Place 4  Registration 3  Attention/ Calculation 3  Recall 1  Language- name 2 objects 2  Language- repeat 0  Language- follow 3 step command 3  Language- read & follow direction 1  Write a sentence 1  Copy design 1  Total score 24        12/06/2021    9:53 AM 11/09/2020   12:48 PM 10/28/2019   10:32 AM 10/23/2018   11:51 AM  6CIT Screen  What Year? 0 points 0 points 0 points 0 points  What month? 0 points 0 points 0 points 0 points  What time? 3 points 0 points 0 points 0 points  Count back from 20 0 points 0 points 0 points 0 points  Months in reverse 0 points 0 points 0 points 0  points  Repeat phrase 2 points 2 points 0 points 0 points  Total Score 5 points 2 points 0 points 0 points    Immunizations Immunization History  Administered Date(s) Administered   Fluad Quad(high Dose 65+) 02/12/2019, 02/23/2020, 03/30/2021   Influenza Split 04/10/2011, 04/15/2012, 03/04/2013, 03/04/2014, 03/05/2015, 03/04/2017   Influenza Whole 06/13/2010   Influenza,inj,Quad PF,6+ Mos 03/22/2016   Influenza-Unspecified 03/22/2016, 03/04/2018   PFIZER(Purple Top)SARS-COV-2 Vaccination 08/09/2019, 09/08/2019, 05/18/2020   Pneumococcal Conjugate-13 11/13/2017   Pneumococcal Polysaccharide-23 06/04/2009, 12/28/2010, 06/11/2019   Td 05/31/2008   Tdap 12/13/2018   Unspecified SARS-COV-2 Vaccination 09/08/2019   Zoster Recombinat (Shingrix) 10/05/2021    TDAP status: Up to date  Flu Vaccine status: Up to date  Pneumococcal vaccine status: Up to date  Covid-19 vaccine status: Completed vaccines  Qualifies for Shingles Vaccine? Yes   Zostavax completed No   Shingrix Completed?: needs second dose  Screening Tests Health Maintenance  Topic Date Due   COVID-19 Vaccine (5 - Booster) 07/13/2020   OPHTHALMOLOGY EXAM  01/20/2021   Zoster Vaccines- Shingrix (  2 of 2) 11/30/2021   INFLUENZA VACCINE  01/02/2022   URINE MICROALBUMIN  02/23/2022   FOOT EXAM  03/01/2022   HEMOGLOBIN A1C  04/07/2022   MAMMOGRAM  10/08/2022   TETANUS/TDAP  12/12/2028   Pneumonia Vaccine 84+ Years old  Completed   DEXA SCAN  Completed   Hepatitis C Screening  Completed   HPV VACCINES  Aged Out   COLONOSCOPY (Pts 45-72yr Insurance coverage will need to be confirmed)  [place] Due  Topic Date Due   COVID-19 Vaccine (5 - Booster) 07/13/2020   OPHTHALMOLOGY EXAM  01/20/2021   Zoster Vaccines- Shingrix (2 of 2) 11/30/2021    Colorectal cancer screening: No longer required.   Mammogram status: Completed 10/07/2021. Repeat every year  Bone Density  status: Completed 08/14/2021.   Lung Cancer Screening: (Low Dose CT Chest recommended if Age 77-80years, 30 pack-year currently smoking OR have quit w/in 15years.) does not qualify.   Lung Cancer Screening Referral: no  Additional Screening:  Hepatitis C Screening: does qualify; Completed 05/23/2018  Vision Screening: Recommended annual ophthalmology exams for early detection of glaucoma and other disorders of the eye. Is the patient up to date with their annual eye exam?  Yes  Who is the provider or what is the name of the office in which the patient attends annual eye exams? Dr. WVenetia MaxonIf pt is not established with a provider, would they like to be referred to a provider to establish care? No .   Dental Screening: Recommended annual dental exams for proper oral hygiene  Community Resource Referral / Chronic Care Management: CRR required this visit?  No   CCM required this visit?  No      Plan:     I have personally reviewed and noted the following in the patient's chart:   Medical and social history Use of alcohol, tobacco or illicit drugs  Current medications and supplements including opioid prescriptions.  Functional ability and status Nutritional status Physical activity Advanced directives List of other physicians Hospitalizations, surgeries, and ER visits in previous 12 months Vitals Screenings to include cognitive, depression, and falls Referrals and appointments  In addition, I have reviewed and discussed with patient certain preventive protocols, quality metrics, and best practice recommendations. A written personalized care plan for preventive services as well as general preventive health recommendations were provided to patient.     NKellie Simmering LPN   [id number]/08/[id number]  Nurse Notes: none

## 2021-12-06 NOTE — Patient Instructions (Signed)
Mariah Burns , Thank you for taking time to come for your Medicare Wellness Visit. I appreciate your ongoing commitment to your health goals. Please review the following plan we discussed and let me know if I can assist you in the future.   Screening recommendations/referrals: Colonoscopy: not required Mammogram: completed 10/07/2021, due 10/09/2022 Bone Density: completed 08/14/2021 Recommended yearly ophthalmology/optometry visit for glaucoma screening and checkup Recommended yearly dental visit for hygiene and checkup  Vaccinations: Influenza vaccine: due 01/02/2022 Pneumococcal vaccine: completed 06/11/2019 Tdap vaccine: completed 12/13/2018, due 12/12/2028 Shingles vaccine: needs second dose   Covid-19: 05/18/2020, 09/08/2019, 08/09/2019  Advanced directives: Please bring a copy of your POA (Power of Attorney) and/or Living Will to your next appointment.    Conditions/risks identified: none  Next appointment: Follow up in one year for your annual wellness visit    Preventive Care 65 Years and Older, Female Preventive care refers to lifestyle choices and visits with your health care provider that can promote health and wellness. What does preventive care include? A yearly physical exam. This is also called an annual well check. Dental exams once or twice a year. Routine eye exams. Ask your health care provider how often you should have your eyes checked. Personal lifestyle choices, including: Daily care of your teeth and gums. Regular physical activity. Eating a healthy diet. Avoiding tobacco and drug use. Limiting alcohol use. Practicing safe sex. Taking low-dose aspirin every day. Taking vitamin and mineral supplements as recommended by your health care provider. What happens during an annual well check? The services and screenings done by your health care provider during your annual well check will depend on your age, overall health, lifestyle risk factors, and family history of  disease. Counseling  Your health care provider may ask you questions about your: Alcohol use. Tobacco use. Drug use. Emotional well-being. Home and relationship well-being. Sexual activity. Eating habits. History of falls. Memory and ability to understand (cognition). Work and work Statistician. Reproductive health. Screening  You may have the following tests or measurements: Height, weight, and BMI. Blood pressure. Lipid and cholesterol levels. These may be checked every 5 years, or more frequently if you are over 52 years old. Skin check. Lung cancer screening. You may have this screening every year starting at age 69 if you have a 30-pack-year history of smoking and currently smoke or have quit within the past 15 years. Fecal occult blood test (FOBT) of the stool. You may have this test every year starting at age 36. Flexible sigmoidoscopy or colonoscopy. You may have a sigmoidoscopy every 5 years or a colonoscopy every 10 years starting at age 76. Hepatitis C blood test. Hepatitis B blood test. Sexually transmitted disease (STD) testing. Diabetes screening. This is done by checking your blood sugar (glucose) after you have not eaten for a while (fasting). You may have this done every 1-3 years. Bone density scan. This is done to screen for osteoporosis. You may have this done starting at age 39. Mammogram. This may be done every 1-2 years. Talk to your health care provider about how often you should have regular mammograms. Talk with your health care provider about your test results, treatment options, and if necessary, the need for more tests. Vaccines  Your health care provider may recommend certain vaccines, such as: Influenza vaccine. This is recommended every year. Tetanus, diphtheria, and acellular pertussis (Tdap, Td) vaccine. You may need a Td booster every 10 years. Zoster vaccine. You may need this after age 79. Pneumococcal 13-valent  conjugate (PCV13) vaccine. One  dose is recommended after age 45. Pneumococcal polysaccharide (PPSV23) vaccine. One dose is recommended after age 109. Talk to your health care provider about which screenings and vaccines you need and how often you need them. This information is not intended to replace advice given to you by your health care provider. Make sure you discuss any questions you have with your health care provider. Document Released: 06/17/2015 Document Revised: 02/08/2016 Document Reviewed: 03/22/2015 Elsevier Interactive Patient Education  2017 [place] Prevention in the Home Falls can cause injuries. They can happen to people of all ages. There are many things you can do to make your home safe and to help prevent falls. What can I do on the outside of my home? Regularly fix the edges of walkways and driveways and fix any cracks. Remove anything that might make you trip as you walk through a door, such as a raised step or threshold. Trim any bushes or trees on the path to your home. Use bright outdoor lighting. Clear any walking paths of anything that might make someone trip, such as rocks or tools. Regularly check to see if handrails are loose or broken. Make sure that both sides of any steps have handrails. Any raised decks and porches should have guardrails on the edges. Have any leaves, snow, or ice cleared regularly. Use sand or salt on walking paths during winter. Clean up any spills in your garage right away. This includes oil or grease spills. What can I do in the bathroom? Use night lights. Install grab bars by the toilet and in the tub and shower. Do not use towel bars as grab bars. Use non-skid mats or decals in the tub or shower. If you need to sit down in the shower, use a plastic, non-slip stool. Keep the floor dry. Clean up any water that spills on the floor as soon as it happens. Remove soap buildup in the tub or shower regularly. Attach bath mats securely with double-sided  non-slip rug tape. Do not have throw rugs and other things on the floor that can make you trip. What can I do in the bedroom? Use night lights. Make sure that you have a light by your bed that is easy to reach. Do not use any sheets or blankets that are too big for your bed. They should not hang down onto the floor. Have a firm chair that has side arms. You can use this for support while you get dressed. Do not have throw rugs and other things on the floor that can make you trip. What can I do in the kitchen? Clean up any spills right away. Avoid walking on wet floors. Keep items that you use a lot in easy-to-reach places. If you need to reach something above you, use a strong step stool that has a grab bar. Keep electrical cords out of the way. Do not use floor polish or wax that makes floors slippery. If you must use wax, use non-skid floor wax. Do not have throw rugs and other things on the floor that can make you trip. What can I do with my stairs? Do not leave any items on the stairs. Make sure that there are handrails on both sides of the stairs and use them. Fix handrails that are broken or loose. Make sure that handrails are as long as the stairways. Check any carpeting to make sure that it is firmly attached to the stairs. Fix any carpet that  is loose or worn. Avoid having throw rugs at the top or bottom of the stairs. If you do have throw rugs, attach them to the floor with carpet tape. Make sure that you have a light switch at the top of the stairs and the bottom of the stairs. If you do not have them, ask someone to add them for you. What else can I do to help prevent falls? Wear shoes that: Do not have high heels. Have rubber bottoms. Are comfortable and fit you well. Are closed at the toe. Do not wear sandals. If you use a stepladder: Make sure that it is fully opened. Do not climb a closed stepladder. Make sure that both sides of the stepladder are locked into place. Ask  someone to hold it for you, if possible. Clearly mark and make sure that you can see: Any grab bars or handrails. First and last steps. Where the edge of each step is. Use tools that help you move around (mobility aids) if they are needed. These include: Canes. Walkers. Scooters. Crutches. Turn on the lights when you go into a dark area. Replace any light bulbs as soon as they burn out. Set up your furniture so you have a clear path. Avoid moving your furniture around. If any of your floors are uneven, fix them. If there are any pets around you, be aware of where they are. Review your medicines with your doctor. Some medicines can make you feel dizzy. This can increase your chance of falling. Ask your doctor what other things that you can do to help prevent falls. This information is not intended to replace advice given to you by your health care provider. Make sure you discuss any questions you have with your health care provider. Document Released: 03/17/2009 Document Revised: 10/27/2015 Document Reviewed: 06/25/2014 Elsevier Interactive Patient Education  2017 [employer].

## 2021-12-07 ENCOUNTER — Encounter: Payer: Self-pay | Admitting: Internal Medicine

## 2021-12-13 ENCOUNTER — Other Ambulatory Visit: Payer: Self-pay | Admitting: Internal Medicine

## 2022-01-17 DIAGNOSIS — H43813 Vitreous degeneration, bilateral: Secondary | ICD-10-CM | POA: Diagnosis not present

## 2022-01-17 DIAGNOSIS — H2513 Age-related nuclear cataract, bilateral: Secondary | ICD-10-CM | POA: Diagnosis not present

## 2022-01-17 DIAGNOSIS — H16223 Keratoconjunctivitis sicca, not specified as Sjogren's, bilateral: Secondary | ICD-10-CM | POA: Diagnosis not present

## 2022-01-17 DIAGNOSIS — H35033 Hypertensive retinopathy, bilateral: Secondary | ICD-10-CM | POA: Diagnosis not present

## 2022-01-17 DIAGNOSIS — E119 Type 2 diabetes mellitus without complications: Secondary | ICD-10-CM | POA: Diagnosis not present

## 2022-01-18 NOTE — Progress Notes (Signed)
This encounter was created in error - please disregard.

## 2022-01-31 ENCOUNTER — Encounter: Payer: Self-pay | Admitting: Internal Medicine

## 2022-01-31 ENCOUNTER — Ambulatory Visit: Payer: Medicare PPO | Admitting: Internal Medicine

## 2022-01-31 VITALS — BP 140/78 | HR 55 | Temp 98.1°F | Ht 59.0 in | Wt 145.0 lb

## 2022-01-31 DIAGNOSIS — N182 Chronic kidney disease, stage 2 (mild): Secondary | ICD-10-CM | POA: Diagnosis not present

## 2022-01-31 DIAGNOSIS — I129 Hypertensive chronic kidney disease with stage 1 through stage 4 chronic kidney disease, or unspecified chronic kidney disease: Secondary | ICD-10-CM | POA: Diagnosis not present

## 2022-01-31 DIAGNOSIS — Z23 Encounter for immunization: Secondary | ICD-10-CM

## 2022-01-31 DIAGNOSIS — C773 Secondary and unspecified malignant neoplasm of axilla and upper limb lymph nodes: Secondary | ICD-10-CM

## 2022-01-31 DIAGNOSIS — E7800 Pure hypercholesterolemia, unspecified: Secondary | ICD-10-CM

## 2022-01-31 DIAGNOSIS — T63461A Toxic effect of venom of wasps, accidental (unintentional), initial encounter: Secondary | ICD-10-CM

## 2022-01-31 DIAGNOSIS — E1122 Type 2 diabetes mellitus with diabetic chronic kidney disease: Secondary | ICD-10-CM

## 2022-01-31 DIAGNOSIS — G3184 Mild cognitive impairment, so stated: Secondary | ICD-10-CM | POA: Diagnosis not present

## 2022-01-31 DIAGNOSIS — C50411 Malignant neoplasm of upper-outer quadrant of right female breast: Secondary | ICD-10-CM

## 2022-01-31 DIAGNOSIS — Z170 Estrogen receptor positive status [ER+]: Secondary | ICD-10-CM | POA: Diagnosis not present

## 2022-01-31 NOTE — Patient Instructions (Signed)

## 2022-01-31 NOTE — Progress Notes (Signed)
I,Victoria T Hamilton,acting as a scribe for [name], MD.,have documented all relevant documentation on the behalf of [name], MD,as directed by  [name], MD while in the presence of [name], MD.    Subjective:     Patient ID: Mariah Burns , female    DOB: 1945/07/15 , 76 y.o.   MRN: [id number]   Chief Complaint  Patient presents with   Diabetes   Hypertension    HPI  The patient is here today for a diabetes and blood pressure f/u.  She reports compliance with meds. She denies headaches, chest pain and shortness of breath.   She adds she was stung by a wasp last Wednesday on her left forearm. She complains of itchy skin. She used aloe vera, baking soda & CeraVae without relief of her sx.   Diabetes She presents for her follow-up diabetic visit. She has type 2 diabetes mellitus. Her disease course has been stable. Pertinent negatives for diabetes include no blurred vision. There are no hypoglycemic complications. Diabetic complications include nephropathy. Risk factors for coronary artery disease include diabetes mellitus, dyslipidemia, hypertension, sedentary lifestyle and post-menopausal. She is compliant with treatment most of the time.  Hypertension This is a chronic problem. The current episode started more than 1 year ago. The problem has been gradually improving since onset. The problem is controlled. Pertinent negatives include no blurred vision.     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: failed Imuran & Prednisone therapy d/t adverse side effects TLC 106%, DLCO 75% 2009     Family History  Problem Relation Age of Onset   Allergies Sister    Heart disease Sister    Lupus Sister     Allergies Mother    Allergies Daughter    Healthy Daughter    Heart disease Sister    Gout Brother    Hypertension Brother    Heart disease Brother    Heart failure Father    Gout Brother    Hypertension Brother    Hypertension Brother    Hypertension Brother    Post-traumatic stress disorder Brother    Healthy Daughter      Current Outpatient Medications:    aspirin EC 81 MG tablet, Take 81 mg by mouth daily. Swallow whole., Disp: , Rfl:    calcium carbonate (TUMS - DOSED IN MG ELEMENTAL CALCIUM) 500 MG chewable tablet, Chew 1 tablet by mouth as needed. (Patient not taking: Reported on 12/06/2021), Disp: , Rfl:    CALCIUM PO, Take by mouth daily., Disp: , Rfl:    Cholecalciferol (VITAMIN D-3 PO), Take 1,000 Units by mouth daily., Disp: , Rfl:    Cyanocobalamin (VITAMIN B-12 PO), , Disp: , Rfl:    famotidine (PEPCID) 40 MG tablet, as needed. (Patient not taking: Reported on 12/06/2021), Disp: , Rfl:    felodipine (PLENDIL) 10 MG 24 hr tablet, TAKE 1 TABLET BY MOUTH EVERY DAY, Disp: 90 tablet, Rfl: 2   fish oil-omega-3 fatty acids 1000 MG capsule, Take 2 capsules by mouth daily., Disp: , Rfl:    Fluocinolone Acetonide Scalp 0.01 % OIL, Apply to affected area of  scalp as directed 1-2 x/day as needed for itching, Disp: 118.28 mL, Rfl: 1   GLUCOSAMINE-CHONDROITIN PO, Take 2 tablets by mouth daily., Disp: , Rfl:    L-Methylfolate-Algae-B12-B6 (METANX) 3-90.314-2-35 MG CAPS, Take 1 capsule by mouth 2 (two) times daily. , Disp: , Rfl:    Magnesium 250 MG TABS, Take by mouth daily. Tues, Thursday, Saturday, Disp: , Rfl:    Menthol, Topical Analgesic, (BIOFREEZE ROLL-ON EX), Apply 1 application. topically as needed (pain in back,neck,thumb)., Disp: , Rfl:    nitroGLYCERIN (NITROSTAT) 0.4 MG SL tablet, Place 1 tablet (0.4 mg total) under the tongue every 5 (five) minutes as needed for chest pain., Disp: 25 tablet, Rfl: 2   Polyvinyl Alcohol-Povidone PF 1.4-0.6 % SOLN, Place 1 drop into both eyes  daily as needed (dry eyes). , Disp: , Rfl:    pravastatin (PRAVACHOL) 80 MG tablet, Take 1 tablet (80 mg total) by mouth every evening. Take one tablet by mouth on Monday Wednesday and Friday., Disp: 45 tablet, Rfl: 3   TART CHERRY PO, , Disp: , Rfl:   Current Facility-Administered Medications:    0.9 %  sodium chloride infusion, , Intravenous, PRN, Minette Brine, FNP   Allergies  Allergen Reactions   Fosamax [Alendronate Sodium] Other (See Comments)    REACTION: "CAUSE GI UPSET AND FATIGUE"   Omeprazole Other (See Comments)    Lower ab pain   Motrin [Ibuprofen] Other (See Comments)    GI upset- has to eat prior to taking   Fluvastatin Other (See Comments)    mild memoryproblems of confusion      Review of Systems  Constitutional: Negative.   Eyes:  Negative for blurred vision.  Respiratory: Negative.    Cardiovascular: Negative.   Skin:        She has itchy area on left forearm, result of wasp sting.   Neurological: Negative.   Psychiatric/Behavioral: Negative.    x   Today's Vitals   01/31/22 1008 01/31/22 1035  BP: (!) 140/88 (!) 140/78  Pulse: (!) 55   Temp: 98.1 F (36.7 C)   SpO2: 98%   Weight: 145 lb (65.8 kg)   Height: _0  (1.499 m)   PainSc: 0-No pain    Body mass index is 29.29 kg/m.  Wt Readings from Last 3 Encounters:  01/31/22 145 lb (65.8 kg)  12/06/21 143 lb 3.2 oz (65 kg)  10/05/21 146 lb (66.2 kg)    BP Readings from Last 3 Encounters:  01/31/22 (!) 140/78  12/06/21 130/62  10/05/21 (!) 142/70     Objective:  Physical Exam Vitals and nursing note reviewed.  Constitutional:      Appearance: Normal appearance.  HENT:     Head: Normocephalic and atraumatic.  Eyes:     Extraocular Movements: Extraocular movements intact.  Cardiovascular:     Rate and Rhythm: Normal rate and regular rhythm.     Heart sounds: Normal heart sounds.  Pulmonary:     Effort: Pulmonary effort is normal.     Breath sounds: Normal breath sounds.   Musculoskeletal:     Cervical back: Normal range of motion.  Skin:    General: Skin is warm.     Comments: Left forearm swelling, no erythema  Neurological:     General: No focal deficit present.     Mental Status: She is alert.  Psychiatric:        Mood and Affect: Mood normal.        Behavior: Behavior normal.  Assessment And Plan:     1. Type 2 diabetes mellitus with stage 2 chronic kidney disease, without long-term current use of insulin (HCC) Comments: Chronic, I will check labs as below.  She prefers to control DM with diet/lifestyle changes. Reminded to keep BP/BS controlled and avoid NSAIDs to address CKD.  - Lipid panel - CMP14+EGFR - Hemoglobin A1c - CBC no Diff  2. Hypertensive nephropathy Comments: Chronic, uncontrolled. Goal BP <130/80. She will c/w felodipine 67m daily.  - Lipid panel - CMP14+EGFR  3. Pure hypercholesterolemia Comments: Chronic, LDL goal <70. She will continue wiht pravastatin 46mdaily.   4. Mild cognitive impairment Comments: Chronic, Neuro input is appreciated.   5. Wasp sting, accidental or unintentional, initial encounter Comments: Occurred on 01/24/22. Advised to apply OTC HC cream to affected area twice daily prn.   6. Malignant neoplasm of upper-outer quadrant of right breast in female, estrogen receptor positive (HCFoxhomeComments: Diagnosed in 08/2018. S/p right lumpectomy with SLNB on 09/02/18 adjuvant radiation therapy. She has been intolerant of antiestrogen therapy.   7. Immunization due Comments: She was given Shingrix IM x 1, billed via [employer].    Patient was given opportunity to ask questions. Patient verbalized understanding of the plan and was able to repeat key elements of the plan. All questions were answered to their satisfaction.   I, RoMaximino GreenlandMD, have reviewed all documentation for this visit. The documentation on 01/31/22 for the exam, diagnosis, procedures, and orders are all accurate and complete.    IF YOU HAVE BEEN REFERRED TO A SPECIALIST, IT MAY TAKE 1-2 WEEKS TO SCHEDULE/PROCESS THE REFERRAL. IF YOU HAVE NOT HEARD FROM US/SPECIALIST IN TWO WEEKS, PLEASE GIVE [place] CALL AT [phone] X [phone].   THE PATIENT IS ENCOURAGED TO PRACTICE SOCIAL DISTANCING DUE TO THE COVID-19 PANDEMIC.

## 2022-02-01 LAB — CMP14+EGFR
ALT: 15 IU/L (ref 0–32)
AST: 20 IU/L (ref 0–40)
Albumin/Globulin Ratio: 1.5 (ref 1.2–2.2)
Albumin: 4.5 g/dL (ref 3.8–4.8)
Alkaline Phosphatase: 76 IU/L (ref 44–121)
BUN/Creatinine Ratio: 11 — ABNORMAL LOW (ref 12–28)
BUN: 9 mg/dL (ref 8–27)
Bilirubin Total: 0.5 mg/dL (ref 0.0–1.2)
CO2: 22 mmol/L (ref 20–29)
Calcium: 9.3 mg/dL (ref 8.7–10.3)
Chloride: 103 mmol/L (ref 96–106)
Creatinine, Ser: 0.8 mg/dL (ref 0.57–1.00)
Globulin, Total: 3.1 g/dL (ref 1.5–4.5)
Glucose: 95 mg/dL (ref 70–99)
Potassium: 3.4 mmol/L — ABNORMAL LOW (ref 3.5–5.2)
Sodium: 141 mmol/L (ref 134–144)
Total Protein: 7.6 g/dL (ref 6.0–8.5)
eGFR: 76 mL/min/{1.73_m2} (ref >59)

## 2022-02-01 LAB — LIPID PANEL
Chol/HDL Ratio: 2.8 ratio (ref 0.0–4.4)
Cholesterol, Total: 169 mg/dL (ref 100–199)
HDL: 61 mg/dL (ref >39)
LDL Chol Calc (NIH): 96 mg/dL (ref 0–99)
Triglycerides: 63 mg/dL (ref 0–149)
VLDL Cholesterol Cal: 12 mg/dL (ref 5–40)

## 2022-02-01 LAB — CBC
Hematocrit: 36.9 % (ref 34.0–46.6)
Hemoglobin: 11.8 g/dL (ref 11.1–15.9)
MCH: 28.2 pg (ref 26.6–33.0)
MCHC: 32 g/dL (ref 31.5–35.7)
MCV: 88 fL (ref 79–97)
Platelets: 189 10*3/uL (ref 150–450)
RBC: 4.18 x10E6/uL (ref 3.77–5.28)
RDW: 13.7 % (ref 11.7–15.4)
WBC: 4.2 10*3/uL (ref 3.4–10.8)

## 2022-02-01 LAB — HEMOGLOBIN A1C
Est. average glucose Bld gHb Est-mCnc: 126 mg/dL
Hgb A1c MFr Bld: 6 % — ABNORMAL HIGH (ref 4.8–5.6)

## 2022-02-06 ENCOUNTER — Ambulatory Visit: Payer: Self-pay

## 2022-02-06 NOTE — Patient Outreach (Signed)
  Care Coordination   02/06/2022 Name: Mariah Burns MRN: [id number] DOB: 12/11/1945   Care Coordination Outreach Attempts:  An unsuccessful telephone outreach was attempted for a scheduled appointment today.  Follow Up Plan:  Additional outreach attempts will be made to offer the patient care coordination information and services.   Encounter Outcome:  No Answer  Care Coordination Interventions Activated:  No   Care Coordination Interventions:  No, not indicated    Barb Merino, RN, BSN, CCM Care Management Coordinator [facility] Care Management Direct Phone: [phone]

## 2022-02-08 ENCOUNTER — Other Ambulatory Visit: Payer: Self-pay

## 2022-02-09 ENCOUNTER — Telehealth: Payer: Self-pay

## 2022-02-09 NOTE — Chronic Care Management (AMB) (Signed)
     Mariah Burns was reminded to have all medications, supplements and any blood glucose and blood pressure readings available for review with [name], Pharm. D, at her telephone visit on 02-13-2022 at 10:45.   Questions: Have you had any recent office visit or specialist visit outside of [place]? Patient stated no  Are there any concerns you would like to discuss during your office visit? Patient stated no  Are you having any problems obtaining your medications? (Whether it pharmacy issues or cost) Patient stated no  If patient has any PAP medications ask if they are having any problems getting their PAP medication or refill? No PAP medications  Care Gaps: Covid booster overdue Flu vaccine overdue  Star Rating Drug: Pravastatin 80 mg- Last filled 12-29-2021 90 [facility]  Any gaps in medications fill history? No  [place] Pharmacist Assistant [phone]

## 2022-02-13 ENCOUNTER — Ambulatory Visit: Payer: Medicare PPO

## 2022-02-13 DIAGNOSIS — E7800 Pure hypercholesterolemia, unspecified: Secondary | ICD-10-CM

## 2022-02-13 DIAGNOSIS — I130 Hypertensive heart and chronic kidney disease with heart failure and stage 1 through stage 4 chronic kidney disease, or unspecified chronic kidney disease: Secondary | ICD-10-CM

## 2022-02-13 NOTE — Progress Notes (Unsigned)
Chronic Care Management Pharmacy Note  02/14/2022 Name:  Mariah Burns MRN:  [id number] DOB:  Jun 23, 1945  Summary: Patient reports that she is   Recommendations/Changes made from today's visit: Recommend patient get a new glucose monitor, due to expiration.  Recommend contacting insurance for OTC book    Plan: Patient to call insurance to determine if she has an OTC book provided through insurance.  She is going to call me back with information about her insurance options for glucose supplies and her OTC options.    Subjective: Mariah Burns is an 76 y.o. year old female who is a primary patient of Glendale Chard, MD.  The CCM team was consulted for assistance with disease management and care coordination needs.    Engaged with patient by telephone for follow up visit in response to provider referral for pharmacy case management and/or care coordination services.   Consent to Services:  The patient was given information about Chronic Care Management services, agreed to services, and gave verbal consent prior to initiation of services.  Please see initial visit note for detailed documentation.   Patient Care Team: Glendale Chard, MD as PCP - General (Internal Medicine) Belva Crome, MD as PCP - Cardiology (Cardiology) Mauro Kaufmann, RN as Oncology Nurse Navigator Rockwell Germany, RN as Oncology Nurse Navigator Excell Seltzer, MD (Inactive) as Consulting Physician (General Surgery) Truitt Merle, MD as Consulting Physician (Hematology) Gery Pray, MD as Consulting Physician (Radiation Oncology) Alla Feeling, NP as Nurse Practitioner (Nurse Practitioner) Warren Danes, PA-C as Physician Assistant (Dermatology) Lynne Logan, RN as [facility] Saron Vanorman, Sharyn Blitz, [facility] (Pharmacist) Marylynn Thurl Boen, MD as Consulting Physician (Ophthalmology)  Recent office visits: 01/31/2022 PCP OV 10/05/2021 PCP [facility] visits: 07/07/2021  ED Visit    Objective:  Lab Results  Component Value Date   CREATININE 0.80 01/31/2022   BUN 9 01/31/2022   EGFR 76 01/31/2022   GFRNONAA >60 08/23/2021   GFRAA 85 05/26/2020   NA 141 01/31/2022   K 3.4 (L) 01/31/2022   CALCIUM 9.3 01/31/2022   CO2 22 01/31/2022   GLUCOSE 95 01/31/2022    Lab Results  Component Value Date/Time   HGBA1C 6.0 (H) 01/31/2022 10:51 AM   HGBA1C 5.8 (H) 10/05/2021 12:36 PM   MICROALBUR 30 02/23/2021 05:06 PM   MICROALBUR 30 02/23/2020 01:26 PM    Last diabetic Eye exam:  Lab Results  Component Value Date/Time   HMDIABEYEEXA No Retinopathy 10/31/2021 12:00 AM    Last diabetic Foot exam: No results found for: "HMDIABFOOTEX"   Lab Results  Component Value Date   CHOL 169 01/31/2022   HDL 61 01/31/2022   LDLCALC 96 01/31/2022   TRIG 63 01/31/2022   CHOLHDL 2.8 01/31/2022       Latest Ref Rng & Units 01/31/2022   10:51 AM 08/23/2021   10:14 AM 06/06/2021   12:10 PM  Hepatic Function  Total Protein 6.0 - 8.5 g/dL 7.6  7.6  7.7   Albumin 3.8 - 4.8 g/dL 4.5  4.2  4.7   AST 0 - 40 IU/L 20  19  23    ALT 0 - 32 IU/L 15  15  12    Alk Phosphatase 44 - 121 IU/L 76  63  70   Total Bilirubin 0.0 - 1.2 mg/dL 0.5  0.5  0.3     Lab Results  Component Value Date/Time   TSH 1.170 11/02/2020 10:36 AM   TSH  1.240 06/11/2019 02:24 PM       Latest Ref Rng & Units 01/31/2022   10:51 AM 08/23/2021   10:14 AM 07/07/2021    9:28 PM  CBC  WBC 3.4 - 10.8 x10E3/uL 4.2  3.5  4.4   Hemoglobin 11.1 - 15.9 g/dL 11.8  11.6  12.3   Hematocrit 34.0 - 46.6 % 36.9  35.5  38.6   Platelets 150 - 450 x10E3/uL 189  177  192     Lab Results  Component Value Date/Time   VD25OH 43.0 10/28/2019 11:37 AM    Clinical ASCVD: No  The ASCVD Risk score (Arnett DK, et al., 2019) failed to calculate for the following reasons:   Unable to determine if patient is Non-Hispanic African American       01/31/2022   10:08 AM 12/06/2021    9:48 AM 10/05/2021   11:58 AM   Depression screen PHQ 2/9  Decreased Interest 0 0 0  Down, Depressed, Hopeless 0 0 0  PHQ - 2 Score 0 0 0  Altered sleeping 0  0  Tired, decreased energy 0  0  Change in appetite 0  0  Feeling bad or failure about yourself  0  0  Trouble concentrating 0  0  Moving slowly or fidgety/restless 0  0  Suicidal thoughts 0  0  PHQ-9 Score 0  0  Difficult doing work/chores Not difficult at all       Social History   Tobacco Use  Smoking Status Never  Smokeless Tobacco Never   BP Readings from Last 3 Encounters:  01/31/22 (!) 140/78  12/06/21 130/62  10/05/21 (!) 142/70   Pulse Readings from Last 3 Encounters:  01/31/22 (!) 55  12/06/21 (!) 55  10/05/21 63   Wt Readings from Last 3 Encounters:  01/31/22 145 lb (65.8 kg)  12/06/21 143 lb 3.2 oz (65 kg)  10/05/21 146 lb (66.2 kg)   BMI Readings from Last 3 Encounters:  01/31/22 29.29 kg/m  12/06/21 28.73 kg/m  10/05/21 30.51 kg/m    Assessment/Interventions: Review of patient past medical history, allergies, medications, health status, including review of consultants reports, laboratory and other test data, was performed as part of comprehensive evaluation and provision of chronic care management services.   SDOH:  (Social Determinants of Health) assessments and interventions performed: No SDOH Interventions    Flowsheet Row Office Visit from 06/06/2021 in [place] Management from 01/03/2021 in [place] Office Visit from 11/02/2020 in [place] from 10/28/2019 in [place] from 10/23/2018 in [place] from 05/15/2018 in [facility]  SDOH Interventions        Food Insecurity Interventions -- Intervention Not Indicated -- -- -- --  Housing Interventions -- Intervention Not Indicated -- -- -- --  Transportation Interventions  -- Intervention Not Indicated -- -- -- --  Depression Interventions/Treatment  Patient refuses Treatment -- Patient refuses Treatment PHQ2-9 Score <4 Follow-up Not Indicated PHQ2-9 Score <4 Follow-up Not Indicated Counseling, Medication  [She will c/w therapy as per SEL group. She admits that she is overwhelmed w/ the care of her Mother's estate, the care of her brother, and she has had two losses in her family. She is agreeable to possibly starting meds]      [place]: No Food Insecurity (12/06/2021)  Housing: Low Risk  (01/03/2021)  Transportation Needs:  No Transportation Needs (12/06/2021)  Depression (PHQ2-9): Low Risk  (01/31/2022)  Financial Resource Strain: Low Risk  (12/06/2021)  Physical Activity: Sufficiently Active (12/06/2021)  Stress: No Stress Concern Present (12/06/2021)  Tobacco Use: Low Risk  (01/31/2022)    CCM Care Plan  Allergies  Allergen Reactions   Fosamax [Alendronate Sodium] Other (See Comments)    REACTION: "CAUSE GI UPSET AND FATIGUE"   Omeprazole Other (See Comments)    Lower ab pain   Motrin [Ibuprofen] Other (See Comments)    GI upset- has to eat prior to taking   Fluvastatin Other (See Comments)    mild memoryproblems of confusion     Medications Reviewed Today     Reviewed by Mayford Knife, RPH (Pharmacist) on 02/13/22 at 1112  Med List Status: <None>   Medication Order Taking? Sig Documenting Provider Last Dose Status Informant  0.9 %  sodium chloride infusion [id number]   Minette Brine, FNP  Active   aspirin EC 81 MG tablet [id number] No Take 81 mg by mouth daily. Swallow whole. [provider] Taking Active   calcium carbonate (TUMS - DOSED IN MG ELEMENTAL CALCIUM) 500 MG chewable tablet [id number] No Chew 1 tablet by mouth as needed.  Patient not taking: Reported on 12/06/2021   [provider] Not Taking Active   CALCIUM PO [id number] No Take by mouth daily. [provider] Taking Active    Cholecalciferol (VITAMIN D-3 PO) [id number] No Take 1,000 Units by mouth daily. [provider] Taking Active   Cyanocobalamin (VITAMIN B-12 PO) [id number] No  [provider] Taking Active   famotidine (PEPCID) 40 MG tablet [id number] No as needed.  Patient not taking: Reported on 12/06/2021   [provider] Not Taking Active   felodipine (PLENDIL) 10 MG 24 hr tablet [id number]  TAKE 1 TABLET BY MOUTH EVERY DAY Glendale Chard, MD  Active   fish oil-omega-3 fatty acids 1000 MG capsule [id number] No Take 2 capsules by mouth daily. [provider] Taking Active   Fluocinolone Acetonide Scalp 0.01 % OIL [id number] No Apply to affected area of scalp as directed 1-2 x/day as needed for itching Glendale Chard, MD Taking Active   GLUCOSAMINE-CHONDROITIN PO [id number] No Take 2 tablets by mouth daily. [provider] Taking Active   L-Methylfolate-Algae-B12-B6 Glade Stanford) 3-90.314-2-35 MG CAPS [id number] No Take 1 capsule by mouth 2 (two) times daily.  [provider] Taking Active            Med Note Tamsen Snider   Tue Aug 19, 2018  9:17 AM)    Magnesium 250 MG TABS [id number] No Take by mouth daily. Tues, Thursday, Saturday [provider] Taking Active   Menthol, Topical Analgesic, (BIOFREEZE ROLL-ON EX) [id number] No Apply 1 application. topically as needed (pain in back,neck,thumb). [provider] Taking Active   nitroGLYCERIN (NITROSTAT) 0.4 MG SL tablet [id number] No Place 1 tablet (0.4 mg total) under the tongue every 5 (five) minutes as needed for chest pain. Isaiah Serge, NP Taking Active   Polyvinyl Alcohol-Povidone PF 1.4-0.6 % SOLN [id number] No Place 1 drop into both eyes daily as needed (dry eyes).  [provider] Taking Active   pravastatin (PRAVACHOL) 80 MG tablet [id number] No Take 1 tablet (80 mg total) by mouth every evening. Take one tablet by mouth on Monday Wednesday and Friday.  Patient taking differently:  Take 80 mg by mouth every evening. Take one tablet by mouth Monday - Friday.   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[place]) 10/28/2019   Class 1 obesity due to excess calories with serious comorbidity and body mass index (BMI) of 30.0 to 30.9 in adult 10/28/2019   Vitamin D deficiency disease 10/28/2019   Chronic bilateral low back pain without sciatica 10/28/2019   Hip pain, bilateral 10/28/2019   Malignant neoplasm of upper-outer quadrant of right breast in female, estrogen receptor positive ([place]) 08/11/2018   Chronic renal disease, stage II 05/15/2018   Hypertensive nephropathy 05/15/2018   Adult BMI 29.0-29.9 kg/sq m 05/15/2018   Dyspnea on exertion 02/17/2015   Incomplete right bundle branch block 01/65/5374   Systolic murmur 82/70/7867   Pericardial effusion 02/17/2015   Osteopenia 10/05/2011   Type 2 diabetes mellitus with stage 2 chronic kidney disease, without long-term current use of insulin ([place]) 05/06/2010   SARCOIDOSIS 03/21/2007   HYPERLIPIDEMIA 03/21/2007   Obstructive sleep apnea 03/21/2007   Essential hypertension 03/21/2007    Immunization History  Administered Date(s) Administered   Fluad Quad(high Dose 65+) 02/12/2019, 02/23/2020, 03/30/2021   Influenza Split 04/10/2011, 04/15/2012, 03/04/2013, 03/04/2014, 03/05/2015, 03/04/2017   Influenza Whole 06/13/2010   Influenza,inj,Quad PF,6+ Mos 03/22/2016   Influenza-Unspecified 03/22/2016, 03/04/2018    PFIZER(Purple Top)SARS-COV-2 Vaccination 08/09/2019, 09/08/2019, 05/18/2020   Pneumococcal Conjugate-13 11/13/2017   Pneumococcal Polysaccharide-23 06/04/2009, 12/28/2010, 06/11/2019   Td 05/31/2008   Tdap 12/13/2018   Unspecified SARS-COV-2 Vaccination 09/08/2019   Zoster Recombinat (Shingrix) 10/05/2021, 01/31/2022    Conditions to be addressed/monitored:  Hypertension and Hyperlipidemia  Care Plan : [place]  Updates made by Mayford Knife, RPH since 02/14/2022 12:00 AM     Problem: HTN, HLD   Priority: High     Goal: Patient Stated   Recent Progress: On track  Priority: High  Note:    Current Barriers:  Unable to independently monitor therapeutic efficacy  Pharmacist Clinical Goal(s):  Patient will achieve adherence to monitoring guidelines and medication adherence to achieve therapeutic efficacy achieve control of cholesterol as evidenced by LDL <70 through collaboration with PharmD and provider.   Interventions: 1:1 collaboration with Glendale Chard, MD regarding development and update of comprehensive plan of care as evidenced by provider attestation and co-signature Inter-disciplinary care team collaboration (see longitudinal plan of care) Comprehensive medication review performed; medication list updated in electronic medical record  Hypertension (BP goal <140/90) -Uncontrolled -Current treatment: Felodipine 10 mg tablet once per day. Appropriate, Query effective  -Current home readings: 144/65 pulse: 54  -Current dietary habits: she is limiting the amount of salt she is eating, except she sometimes has a pork rind. -Denies hypotensive/hypertensive symptoms -Educated on BP goals and benefits of medications for prevention of heart attack, stroke and kidney damage; Daily salt intake goal < 2300 mg; -Counseled to monitor BP at home at least three times per week, document, and provide log at future appointments -Collaborate with PCP to determine  next steps for HTN medication due to elevated BP readings.  Hyperlipidemia: (LDL goal < 70) -Uncontrolled -Current treatment: Pravastatin 80 mg tablet - taking one tablet Monday through Friday Appropriate, Effective, Safe, Accessible -Current dietary patterns: we discussed the importance of increasing vegetables and  -Current exercise habits: she is going to the center once a week and doing pool exercises. She has not gone back to the fitness center. She was going to the fitness center three times per week. She is going to an exercise class on Wednesday as well including chair exercises, floor exercises and she is also lifting weights.  -Educated on Benefits of statin for ASCVD risk reduction; Importance of limiting foods high in cholesterol; -Recommended to continue current medication   Patient Goals/Self-Care Activities Patient will:  - take medications as prescribed as evidenced by patient report and record review target a minimum of 150 minutes of moderate intensity exercise weekly  Follow Up Plan: The patient has been provided with contact information for the care management team and has been advised to call with any health related questions or concerns.       Medication Assistance: None required.  Patient affirms current coverage meets needs.  Compliance/Adherence/Medication fill history: Care Gaps: COVID-19 Vaccine  Influenza Vaccine   Star-Rating Drugs: Pravastatin 80 mg tablet   Patient's preferred pharmacy is:  [id number] [place], [place] - [address] [place]Phone: [phone]: [phone] Uses pill box? Yes Pt endorses 90% compliance  We discussed: Benefits of medication synchronization, packaging and delivery as well as enhanced pharmacist oversight with [employer]. Patient decided to: Continue current medication management strategy  Care Plan and Follow Up Patient  Decision:  Patient agrees to Care Plan and Follow-up.  Plan: The patient has been provided with contact information for the care management team and has been advised to call with any health related questions or concerns.   VOrlando Penner CPP, PharmD Clinical Pharmacist Practitioner [facility] [phone]

## 2022-02-14 NOTE — Patient Instructions (Signed)
Visit Information It was great speaking with you today!  Please let me know if you have any questions about our visit.   Goals Addressed             This Visit's Progress    Manage My Medicine       Timeframe:  Long-Range Goal Priority:  High Start Date:                             Expected End Date:                       Follow Up Date 04/17/2022   In Progress:  - call for medicine refill 2 or 3 days before it runs out - call if I am sick and can't take my medicine - keep a list of all the medicines I take; vitamins and herbals too - use a pillbox to sort medicine - use an alarm clock or phone to remind me to take my medicine    Why is this important?   These steps will help you keep on track with your medicines.   Notes:  Please call with any questions.        Patient Care Plan: CCM Pharmacy Care Plan     Problem Identified: HTN, HLD   Priority: High     Goal: Patient Stated   Recent Progress: On track  Priority: High  Note:     Current Barriers:  Unable to independently monitor therapeutic efficacy  Pharmacist Clinical Goal(s):  Patient will achieve adherence to monitoring guidelines and medication adherence to achieve therapeutic efficacy achieve control of cholesterol as evidenced by LDL <70 through collaboration with PharmD and provider.   Interventions: 1:1 collaboration with Glendale Chard, MD regarding development and update of comprehensive plan of care as evidenced by provider attestation and co-signature Inter-disciplinary care team collaboration (see longitudinal plan of care) Comprehensive medication review performed; medication list updated in electronic medical record  Hypertension (BP goal <140/90) -Uncontrolled -Current treatment: Felodipine 10 mg tablet once per day. Appropriate, Query effective  -Current home readings: 144/65 pulse: 54  -Current dietary habits: she is limiting the amount of salt she is eating, except she sometimes  has a pork rind. -Denies hypotensive/hypertensive symptoms -Educated on BP goals and benefits of medications for prevention of heart attack, stroke and kidney damage; Daily salt intake goal < 2300 mg; -Counseled to monitor BP at home at least three times per week, document, and provide log at future appointments -Collaborate with PCP to determine next steps for HTN medication due to elevated BP readings.    Hyperlipidemia: (LDL goal < 70) -Uncontrolled -Current treatment: Pravastatin 80 mg tablet - taking one tablet Monday through Friday Appropriate, Effective, Safe, Accessible         - it was recently increased from three days per week. -Current dietary patterns: we discussed the importance of increasing vegetables and  -Current exercise habits: she is going to the center once a week and doing pool exercises. She has not gone back to the fitness center. She was going to the fitness center three times per week. She is going to an exercise class on Wednesday as well including chair exercises, floor exercises and she is also lifting weights.  -Educated on Benefits of statin for ASCVD risk reduction; Importance of limiting foods high in cholesterol; -Recommended to continue current medication   Patient Goals/Self-Care Activities Patient will:  -  take medications as prescribed as evidenced by patient report and record review target a minimum of 150 minutes of moderate intensity exercise weekly  Follow Up Plan: The patient has been provided with contact information for the care management team and has been advised to call with any health related questions or concerns.         Patient agreed to services and verbal consent obtained.   The patient verbalized understanding of instructions, educational materials, and care plan provided today and agreed to receive a mailed copy of patient instructions, educational materials, and care plan.   [name], PharmD Clinical  Pharmacist [facility] [phone]

## 2022-02-19 ENCOUNTER — Ambulatory Visit: Payer: Self-pay

## 2022-02-19 NOTE — Patient Outreach (Signed)
  Care Coordination   02/19/2022 Name: Mariah Burns MRN: [id number] DOB: 1945-11-13   Care Coordination Outreach Attempts:  An unsuccessful telephone outreach was attempted for a scheduled appointment today.  Follow Up Plan:  Additional outreach attempts will be made to offer the patient care coordination information and services.   Encounter Outcome:  No Answer  Care Coordination Interventions Activated:  No   Care Coordination Interventions:  No, not indicated    Barb Merino, RN, BSN, CCM Care Management Coordinator [facility] Care Management  Direct Phone: [phone]

## 2022-02-23 DIAGNOSIS — G3184 Mild cognitive impairment, so stated: Secondary | ICD-10-CM

## 2022-02-23 DIAGNOSIS — F411 Generalized anxiety disorder: Secondary | ICD-10-CM

## 2022-02-23 HISTORY — DX: Generalized anxiety disorder: F41.1

## 2022-02-26 ENCOUNTER — Ambulatory Visit: Payer: Self-pay

## 2022-02-26 NOTE — Patient Outreach (Signed)
  Care Coordination   02/26/2022 Name: GIULLIANA MCROBERTS MRN: [id number] DOB: 1945/11/18   Care Coordination Outreach Attempts:  An unsuccessful telephone outreach was attempted for a scheduled appointment today.  Follow Up Plan:  No further outreach attempts will be made at this time. We have been unable to contact the patient to offer or enroll patient in care coordination services  Encounter Outcome:  No Answer  Care Coordination Interventions Activated:  No   Care Coordination Interventions:  No, not indicated    Barb Merino, RN, BSN, CCM Care Management Coordinator [place] Management Direct Phone: [phone]

## 2022-02-27 ENCOUNTER — Other Ambulatory Visit: Payer: Self-pay

## 2022-02-27 ENCOUNTER — Other Ambulatory Visit: Payer: Self-pay | Admitting: Nurse Practitioner

## 2022-02-27 DIAGNOSIS — C50411 Malignant neoplasm of upper-outer quadrant of right female breast: Secondary | ICD-10-CM

## 2022-02-27 NOTE — Progress Notes (Unsigned)
[place]   [phone] [phone]   Clinic Follow up Note   Patient Care Team: Glendale Chard, MD as PCP - General (Internal Medicine) Belva Crome, MD as PCP - Cardiology (Cardiology) Mauro Kaufmann, RN as Oncology Nurse Navigator Rockwell Germany, RN as Oncology Nurse Navigator Excell Seltzer, MD (Inactive) as Consulting Physician (General Surgery) Truitt Merle, MD as Consulting Physician (Hematology) Gery Pray, MD as Consulting Physician (Radiation Oncology) Alla Feeling, NP as Nurse Practitioner (Nurse Practitioner) Warren Danes, PA-C as Physician Assistant (Dermatology) Mayford Knife, [facility] (Pharmacist) Marylynn Pearson, MD as Consulting Physician (Ophthalmology) 02/28/2022  CHIEF COMPLAINT: Follow up right breast cancer  SUMMARY OF ONCOLOGIC HISTORY: Oncology History Overview Note  Cancer Staging Malignant neoplasm of upper-outer quadrant of right breast in female, estrogen receptor positive ([place]) Staging form: Breast, AJCC 8th Edition - Clinical stage from 08/04/2018: Stage IA (cT1c, cN0, cM0, G2, ER+, PR+, HER2-) - Signed by Truitt Merle, MD on 08/12/2018 - Pathologic stage from 09/02/2018: Stage IA (pT2, pN1a, cM0, G1, ER+, PR+, HER2-) - Signed by Truitt Merle, MD on 09/29/2018     Malignant neoplasm of upper-outer quadrant of right breast in female, estrogen receptor positive ([place])  07/29/2018 Mammogram   Diagnostic Mammgram 07/29/18  IMPRESSION:  The 1.7cm oval mass in the right breast upper outer qudrant middle third, 3-5 from nipple is suspicious of malignancy.    08/04/2018 Cancer Staging   Staging form: Breast, AJCC 8th Edition - Clinical stage from 08/04/2018: Stage IA (cT1c, cN0, cM0, G2, ER+, PR+, HER2-) - Signed by Truitt Merle, MD on 08/12/2018   08/04/2018 Initial Biopsy   Diagnosis 08/04/18  Breast, right, needle core biopsy, 1.7 cm irregular solid mass at 9 o'clock, 4 cm fn - INVASIVE DUCTAL CARCINOMA WITH EXTRACELLULAR  MUCIN.   08/04/2018 Receptors her2   Results: IMMUNOHISTOCHEMICAL AND MORPHOMETRIC ANALYSIS PERFORMED MANUALLY The tumor cells are NEGATIVE for Her2 (0). Estrogen Receptor: 100%, POSITIVE, STRONG STAINING INTENSITY Progesterone Receptor: 100%, POSITIVE, STRONG STAINING INTENSITY Proliferation Marker Ki67: 10%   08/11/2018 Initial Diagnosis   Malignant neoplasm of upper-outer quadrant of right breast in female, estrogen receptor positive ([place])   09/02/2018 Surgery   RIGHT BREAST LUMPECTOMY WITH RADIOACTIVE SEED AND  RIGHT SENTINEL LYMPH NODE BIOPSY by Dr. Excell Seltzer  09/02/18    09/02/2018 Pathology Results   Diagnosis 1. Breast, lumpectomy, Right w/seed - INVASIVE DUCTAL CARCINOMA WITH EXTRACELLULAR MUCIN, GRADE I, 2.1 CM. - DUCTAL CARCINOMA IN SITU, INTERMEDIATE NUCLEAR GRADE. - INVASIVE CARCINOMA IS 2 MM FROM SUPERIOR MARGIN AND 2.5 MM FROM LATERAL MARGIN. - DUCTAL CARCINOMA IN SITU IS LESS THAN 1 MM FROM ANTERIOR MARGIN, 4 MM FROM SUPERIOR AND LATERAL MARGINS, 5 MM FROM INFERIOR MARGIN AND 8 MM FROM THE POSTERIOR MARGIN. - SMALL INTRADUCTAL PAPILLOMA. - NO EVIDENCE OF LYMPHOVASCULAR OR PERINEURAL INVASION. - BIOPSY SITE CHANGES. - SEE ONCOLOGY TABLE. 2. Lymph node, sentinel, biopsy, Right Axillary - LYMPH NODE, NEGATIVE FOR CARCINOMA (0/1). 3. Lymph node, sentinel, biopsy, Right - LYMPH NODE, NEGATIVE FOR CARCINOMA (0/1). 4. Lymph node, sentinel, biopsy, Right - METASTATIC BREAST CARCINOMA TO ONE LYMPH NODE (1/1). - METASTATIC FOCUS MEASURES 0.4 CM IN GREATEST DIMENSION. - NO DEFINITE EVIDENCE OF EXTRANODAL EXTENSION.   09/02/2018 Miscellaneous   MammaPrint  09/02/18 Low risk with 10% average 10-year risk of recurrnce untreated  Mammaprint Index: +0.287 97.8% of patients treated with hormonal therapy have no distnat recurrance.    09/02/2018 Cancer Staging   Staging form: Breast,  AJCC 8th Edition - Pathologic stage from 09/02/2018: Stage IA (pT2, pN1a, cM0, G1, ER+, PR+, HER2-) -  Signed by Truitt Merle, MD on 09/29/2018   10/05/2018 - 01/2019 Anti-estrogen oral therapy   Anastrozole $RemoveBefo'1mg'JaSrhcNRihF$  daily starting 10/05/18; stopped 5/18 due to insomnia, hot flashes, depression, and vaginal irritation.  I switched her Exemestane in 01/2019. She started having dizziness, hot flashes and joint pain so she stopped two days later.  -She tried for no more than 3 weeks total   11/12/2018 - 12/30/2018 Radiation Therapy   RT 11/12/18-12/30/18 with Dr. Earney Hamburg    05/14/2019 Survivorship   SCP per Cira Rue, NP    06/2019 - 06/2019 Anti-estrogen oral therapy   Tamoxifen $RemoveBe'20mg'KCrJrpQNl$  daily starting 06/2019 but stopped after a few days due to dizziness, nausea and fatigue.      CURRENT THERAPY: Surveillance   INTERVAL HISTORY: Ms. Tweten returns for follow-up as scheduled, last seen by me 08/23/2021.  Mammogram 10/07/2021 was negative. She is feeling well in general without significant changes in her health. She recently moved from a 3 story home to 1 story to reduce her fall risk. Thinks she has some chaffing in her groin from sweat and movement. Denies breast concerns such as new lump/mass, nipple discharge or inversion, or skin change.  Arthritis pain is flaring in her right hip lately, topical treatments are not really helping.  All other systems were reviewed with the patient and are negative.  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: failed Imuran & Prednisone therapy d/t adverse side effects TLC 106%, DLCO 75% 2009    SURGICAL HISTORY: Past Surgical History:  Procedure Laterality Date   BREAST LUMPECTOMY WITH RADIOACTIVE SEED AND SENTINEL LYMPH NODE BIOPSY Right 09/02/2018   Procedure: RIGHT BREAST LUMPECTOMY WITH RADIOACTIVE  SEED AND  RIGHT SENTINEL LYMPH NODE BIOPSY;  Surgeon: Excell Seltzer, MD;  Location: [facility];  Service: General;  Laterality: Right;   BRONCHOSCOPY     dx'd sarcoid   KNEE ARTHROPLASTY Left 03/13/2017   NEUROPLASTY / TRANSPOSITION ULNAR NERVE AT ELBOW  09/2011   TOTAL ABDOMINAL HYSTERECTOMY      I have reviewed the social history and family history with the patient and they are unchanged from previous note.  ALLERGIES:  is allergic to fosamax [alendronate sodium], omeprazole, motrin [ibuprofen], and fluvastatin.  MEDICATIONS:  Current Outpatient Medications  Medication Sig Dispense Refill   aspirin EC 81 MG tablet Take 81 mg by mouth daily. Swallow whole.     CALCIUM PO Take by mouth daily.     Cholecalciferol (VITAMIN D-3 PO) Take 1,000 Units by mouth daily.     Cyanocobalamin (VITAMIN B-12 PO)      famotidine (PEPCID) 40 MG tablet as needed.     felodipine (PLENDIL) 10 MG 24 hr tablet TAKE 1 TABLET BY MOUTH EVERY DAY 90 tablet 2   fish oil-omega-3 fatty acids 1000 MG capsule Take 2 capsules by mouth daily.     Fluocinolone Acetonide Scalp 0.01 % OIL Apply to affected area of scalp as directed 1-2 x/day as needed for itching 118.28 mL 1   GLUCOSAMINE-CHONDROITIN PO Take 2  tablets by mouth daily.     L-Methylfolate-Algae-B12-B6 (METANX) 3-90.314-2-35 MG CAPS Take 1 capsule by mouth 2 (two) times daily.      Magnesium 250 MG TABS Take by mouth daily. Tues, Thursday, Saturday     Menthol, Topical Analgesic, (BIOFREEZE ROLL-ON EX) Apply 1 application. topically as needed (pain in back,neck,thumb).     nitroGLYCERIN (NITROSTAT) 0.4 MG SL tablet Place 1 tablet (0.4 mg total) under the tongue every 5 (five) minutes as needed for chest pain. 25 tablet 2   pravastatin (PRAVACHOL) 80 MG tablet Take 1 tablet (80 mg total) by mouth every evening. Take one tablet by mouth on Monday Wednesday and Friday. (Patient taking differently: Take 80 mg by mouth every evening. Take one  tablet by mouth Monday - Friday.) 45 tablet 3   TART CHERRY PO      Polyvinyl Alcohol-Povidone PF 1.4-0.6 % SOLN Place 1 drop into both eyes daily as needed (dry eyes).      Current Facility-Administered Medications  Medication Dose Route Frequency Provider Last Rate Last Admin   0.9 %  sodium chloride infusion   Intravenous PRN Minette Brine, FNP        PHYSICAL EXAMINATION: ECOG PERFORMANCE STATUS: 0 - Asymptomatic  Vitals:   02/28/22 1229  BP: (!) 160/67  Pulse: 65  Resp: 16  Temp: 98 F (36.7 C)  SpO2: 100%   Filed Weights   02/28/22 1229  Weight: 145 lb 12.8 oz (66.1 kg)    GENERAL:alert, no distress and comfortable SKIN: No rash EYES: sclera clear NECK: Without mass LYMPH:  no palpable cervical or supraclavicular lymphadenopathy LUNGS:  normal breathing effort HEART: no lower extremity edema NEURO: alert & oriented x 3 with fluent speech Breast exam: Breasts are symmetrical without nipple discharge or inversion.  S/p right lumpectomy, incisions completely healed.  No palpable mass or nodularity in either breast or axilla that I could appreciate.  LABORATORY DATA:  I have reviewed the data as listed    Latest Ref Rng & Units 02/28/2022   12:14 PM 01/31/2022   10:51 AM 08/23/2021   10:14 AM  CBC  WBC 4.0 - 10.5 K/uL 4.1  4.2  3.5   Hemoglobin 12.0 - 15.0 g/dL 11.9  11.8  11.6   Hematocrit 36.0 - 46.0 % 36.4  36.9  35.5   Platelets 150 - 400 K/uL 190  189  177         Latest Ref Rng & Units 02/28/2022   12:14 PM 01/31/2022   10:51 AM 08/23/2021   10:14 AM  CMP  Glucose 70 - 99 mg/dL 115  95  111   BUN 8 - 23 mg/dL _0 Creatinine 0.44 - 1.00 mg/dL 0.92  0.80  0.90   Sodium 135 - 145 mmol/L 141  141  142   Potassium 3.5 - 5.1 mmol/L 3.8  3.4  3.3   Chloride 98 - 111 mmol/L 105  103  108   CO2 22 - 32 mmol/L 32  22  28   Calcium 8.9 - 10.3 mg/dL 9.6  9.3  9.6   Total Protein 6.5 - 8.1 g/dL 7.9  7.6  7.6   Total Bilirubin 0.3 - 1.2 mg/dL 0.4  0.5   0.5   Alkaline Phos 38 - 126 U/L 65  76  63   AST 15 - 41 U/L _1 ALT 0 - 44 U/L 16  15  15       RADIOGRAPHIC STUDIES: I have personally reviewed the radiological images as listed and agreed with the findings in the report. No results found.   ASSESSMENT & PLAN: 76 year old female   1. Malignant neoplasm of upper-outer quadrant of right breast, Stage IA, pT2N1aM0, ER/PR+, HER2-, Grade I, Mammaprint low risk  -Diagnosed in 08/2018. S/p right lumpectomy with SLNB on 09/02/18 adjuvant Radiation therapy.  -Her mammaprint showed low risk of distant recurrence. Adjuvant chemo was not recommended.  -Given her ER/PR+ disease, she has tried antiestrogen therapy with Anastrozole, Exemestane and Tamoxifen over 3-4 months total. Given poor tolerance, she stopped and proceeded with surveillance as of 06/2019.  -Ms. Porrata is clinically doing well.  Breast exam is benign, labs are stable, mammogram 10/07/2021 was negative.  Overall no clinical concern for recurrence -She is over 3 years from initial diagnosis, the recurrence risk has decreased.  Continue surveillance -Follow-up in 6 months, or sooner if needed   2. Osteopenia  -12/2018 DEXA shows osteopenia with lowest T-score -1.3 at left femur neck. She has a estimated fracture risk is 4.5% in the next 10 and 0.7% risk of hip fracture in the next 10 years.  -DEXA 08/14/2021 shows worsening BMD in all sites, and increased fracture risk in the hip up to 5.4% -I previously reviewed various medications to improve BMD and reduce fracture risk, she declined -She understands she may experience a fracture if she falls, she knows to avoid falls -Continue calcium, vitamin D, and weightbearing exercise   3. Cancer Screening, Genetic Testing has not been perused yet, pt will think about it.  -Her last colonoscopy was in 2018 and benign adenomas were removed, repeat 5-10 years (Dr. Collene Mares)   4. HTN and DM -per PCP    Plan: -Labs reviewed -Continue  breast cancer surveillance -Follow-up in 6 months -Mammogram 10/2022    All questions were answered. The patient knows to call the clinic with any problems, questions or concerns. No barriers to learning were detected.     Alla Feeling, NP 02/28/22

## 2022-02-28 ENCOUNTER — Encounter: Payer: Self-pay | Admitting: Nurse Practitioner

## 2022-02-28 ENCOUNTER — Other Ambulatory Visit: Payer: Self-pay

## 2022-02-28 ENCOUNTER — Inpatient Hospital Stay: Payer: Medicare PPO | Attending: Nurse Practitioner | Admitting: Nurse Practitioner

## 2022-02-28 ENCOUNTER — Inpatient Hospital Stay: Payer: Medicare PPO

## 2022-02-28 VITALS — BP 160/67 | HR 65 | Temp 98.0°F | Resp 16 | Wt 145.8 lb

## 2022-02-28 DIAGNOSIS — M792 Neuralgia and neuritis, unspecified: Secondary | ICD-10-CM | POA: Diagnosis not present

## 2022-02-28 DIAGNOSIS — I739 Peripheral vascular disease, unspecified: Secondary | ICD-10-CM | POA: Diagnosis not present

## 2022-02-28 DIAGNOSIS — Z170 Estrogen receptor positive status [ER+]: Secondary | ICD-10-CM | POA: Insufficient documentation

## 2022-02-28 DIAGNOSIS — Z7982 Long term (current) use of aspirin: Secondary | ICD-10-CM | POA: Insufficient documentation

## 2022-02-28 DIAGNOSIS — E119 Type 2 diabetes mellitus without complications: Secondary | ICD-10-CM | POA: Diagnosis not present

## 2022-02-28 DIAGNOSIS — I10 Essential (primary) hypertension: Secondary | ICD-10-CM | POA: Insufficient documentation

## 2022-02-28 DIAGNOSIS — C50411 Malignant neoplasm of upper-outer quadrant of right female breast: Secondary | ICD-10-CM | POA: Diagnosis not present

## 2022-02-28 DIAGNOSIS — Z79899 Other long term (current) drug therapy: Secondary | ICD-10-CM | POA: Diagnosis not present

## 2022-02-28 DIAGNOSIS — Z79811 Long term (current) use of aromatase inhibitors: Secondary | ICD-10-CM | POA: Insufficient documentation

## 2022-02-28 DIAGNOSIS — M8580 Other specified disorders of bone density and structure, unspecified site: Secondary | ICD-10-CM | POA: Diagnosis not present

## 2022-02-28 DIAGNOSIS — G5762 Lesion of plantar nerve, left lower limb: Secondary | ICD-10-CM | POA: Diagnosis not present

## 2022-02-28 DIAGNOSIS — E1142 Type 2 diabetes mellitus with diabetic polyneuropathy: Secondary | ICD-10-CM | POA: Diagnosis not present

## 2022-02-28 DIAGNOSIS — M19079 Primary osteoarthritis, unspecified ankle and foot: Secondary | ICD-10-CM | POA: Diagnosis not present

## 2022-02-28 DIAGNOSIS — B351 Tinea unguium: Secondary | ICD-10-CM | POA: Diagnosis not present

## 2022-02-28 DIAGNOSIS — M722 Plantar fascial fibromatosis: Secondary | ICD-10-CM | POA: Diagnosis not present

## 2022-02-28 DIAGNOSIS — L603 Nail dystrophy: Secondary | ICD-10-CM | POA: Diagnosis not present

## 2022-02-28 LAB — CMP (CANCER CENTER ONLY)
ALT: 16 U/L (ref 0–44)
AST: 21 U/L (ref 15–41)
Albumin: 4.3 g/dL (ref 3.5–5.0)
Alkaline Phosphatase: 65 U/L (ref 38–126)
Anion gap: 4 — ABNORMAL LOW (ref 5–15)
BUN: 14 mg/dL (ref 8–23)
CO2: 32 mmol/L (ref 22–32)
Calcium: 9.6 mg/dL (ref 8.9–10.3)
Chloride: 105 mmol/L (ref 98–111)
Creatinine: 0.92 mg/dL (ref 0.44–1.00)
GFR, Estimated: >60 mL/min (ref >60)
Glucose, Bld: 115 mg/dL — ABNORMAL HIGH (ref 70–99)
Potassium: 3.8 mmol/L (ref 3.5–5.1)
Sodium: 141 mmol/L (ref 135–145)
Total Bilirubin: 0.4 mg/dL (ref 0.3–1.2)
Total Protein: 7.9 g/dL (ref 6.5–8.1)

## 2022-02-28 LAB — CBC WITH DIFFERENTIAL (CANCER CENTER ONLY)
Abs Immature Granulocytes: 0 10*3/uL (ref 0.00–0.07)
Basophils Absolute: 0 10*3/uL (ref 0.0–0.1)
Basophils Relative: 1 %
Eosinophils Absolute: 0.1 10*3/uL (ref 0.0–0.5)
Eosinophils Relative: 3 %
HCT: 36.4 % (ref 36.0–46.0)
Hemoglobin: 11.9 g/dL — ABNORMAL LOW (ref 12.0–15.0)
Immature Granulocytes: 0 %
Lymphocytes Relative: 27 %
Lymphs Abs: 1.1 10*3/uL (ref 0.7–4.0)
MCH: 28.8 pg (ref 26.0–34.0)
MCHC: 32.7 g/dL (ref 30.0–36.0)
MCV: 88.1 fL (ref 80.0–100.0)
Monocytes Absolute: 0.4 10*3/uL (ref 0.1–1.0)
Monocytes Relative: 10 %
Neutro Abs: 2.4 10*3/uL (ref 1.7–7.7)
Neutrophils Relative %: 59 %
Platelet Count: 190 10*3/uL (ref 150–400)
RBC: 4.13 MIL/uL (ref 3.87–5.11)
RDW: 13.9 % (ref 11.5–15.5)
WBC Count: 4.1 10*3/uL (ref 4.0–10.5)
nRBC: 0 % (ref 0.0–0.2)

## 2022-02-28 NOTE — Progress Notes (Signed)
Office Visit Note  Patient: Mariah Burns             Date of Birth: 03-07-1946           MRN: [id number]             PCP: Glendale Chard, MD Referring: Glendale Chard, MD Visit Date: 03/14/2022 Occupation: '@GUAROCC'$ @  Subjective:  Pain in multiple joints  History of Present Illness: Mariah Burns is a 76 y.o. female striae of pulmonary sarcoidosis, osteoarthritis and degenerative disc disease.  She returns today after her last visit in October 2022.  She denies any increased shortness of breath.  She states she continues to have pain and discomfort in her neck, bilateral hands, bilateral trochanteric bursa, bilateral knee joints and her feet.  She states she has difficulty walking sometimes when she has discomfort in her trochanteric area and her knee joints.  Has not noticed any joint swelling.  Been going for exercise on a regular basis.  She goes for water aerobics.  She also does stretches at home.    Activities of Daily Living:  Patient reports morning stiffness for 15 minutes.   Patient Reports nocturnal pain.  Difficulty dressing/grooming: Denies Difficulty climbing stairs: Reports Difficulty getting out of chair: Reports Difficulty using hands for taps, buttons, cutlery, and/or writing: Reports  Review of Systems  Constitutional:  Positive for fatigue.  HENT:  Negative for mouth sores and mouth dryness.   Eyes:  Positive for dryness.  Respiratory:  Negative for shortness of breath.   Cardiovascular:  Negative for chest pain and palpitations.  Gastrointestinal:  Negative for blood in stool, constipation and diarrhea.  Endocrine: Negative for increased urination.  Genitourinary:  Negative for involuntary urination.  Musculoskeletal:  Positive for joint pain, gait problem, joint pain, myalgias, muscle weakness, morning stiffness, muscle tenderness and myalgias. Negative for joint swelling.  Skin:  Positive for sensitivity to sunlight. Negative for color change, rash  and hair loss.  Allergic/Immunologic: Negative for susceptible to infections.  Neurological:  Positive for dizziness and numbness. Negative for headaches.  Hematological:  Negative for swollen glands.  Psychiatric/Behavioral:  Positive for depressed mood and sleep disturbance. The patient is nervous/anxious.     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[place]) 10/28/2019   Class 1 obesity due to excess calories with serious comorbidity and body mass index (BMI) of 30.0 to 30.9 in adult 10/28/2019   Vitamin D deficiency disease 10/28/2019   Chronic bilateral low back pain without sciatica 10/28/2019   Hip pain, bilateral 10/28/2019   Malignant neoplasm of upper-outer quadrant of right breast in female, estrogen receptor positive ([place]) 08/11/2018   Chronic renal disease, stage II 05/15/2018   Hypertensive nephropathy 05/15/2018   Adult BMI 29.0-29.9 kg/sq m 05/15/2018   Dyspnea on exertion 02/17/2015   Incomplete right bundle branch block 53/66/4403   Systolic murmur 47/42/5956   Pericardial effusion 02/17/2015   Osteopenia 10/05/2011   Type 2 diabetes mellitus with stage 2 chronic kidney disease, without long-term current use of insulin ([place]) 05/06/2010   SARCOIDOSIS 03/21/2007   Hyperlipidemia with target LDL less than 70 03/21/2007   Obstructive sleep apnea 03/21/2007  Essential hypertension 03/21/2007    Past Medical History:  Diagnosis Date   Anxiety    Cancer ([place]) 08/2018   right breast IDC   Hyperlipidemia    Hypertension    OSA (obstructive sleep apnea)    NPSG 03/08/2010  AHI 19.8/hr, CPAP nightly   Osteopenia    Pre-diabetes     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: failed Imuran & Prednisone therapy d/t adverse side effects TLC 106%, DLCO 75% 2009    Family History  Problem Relation Age of Onset   Allergies Sister    Heart disease Sister    Lupus Sister    Allergies Mother    Allergies Daughter    Healthy Daughter    Heart disease Sister    Gout Brother    Hypertension Brother    Heart disease Brother    Heart failure Father    Gout Brother    Hypertension Brother    Hypertension Brother    Hypertension Brother    Post-traumatic stress disorder Brother    Healthy Daughter    Past Surgical History:  Procedure Laterality Date   BREAST LUMPECTOMY WITH RADIOACTIVE SEED AND SENTINEL LYMPH NODE BIOPSY Right 09/02/2018   Procedure: RIGHT BREAST LUMPECTOMY WITH RADIOACTIVE SEED AND  RIGHT SENTINEL LYMPH NODE BIOPSY;  Surgeon: Excell Seltzer, MD;  Location: [facility];  Service: General;  Laterality: Right;   BRONCHOSCOPY     dx'd sarcoid   KNEE ARTHROPLASTY Left 03/13/2017   NEUROPLASTY / TRANSPOSITION ULNAR NERVE AT ELBOW  09/2011   TOTAL ABDOMINAL HYSTERECTOMY     Social History   Social History Narrative   05/23/21 Patient lives at home with youngest daughter.   Caffeine Use: none   Immunization History  Administered Date(s) Administered   Fluad Quad(high Dose 65+) 02/12/2019, 02/23/2020, 03/30/2021, 03/13/2022   Influenza Split 04/10/2011, 04/15/2012, 03/04/2013, 03/04/2014, 03/05/2015, 03/04/2017   Influenza Whole 06/13/2010   Influenza,inj,Quad PF,6+ Mos 03/22/2016   Influenza-Unspecified 03/22/2016, 03/04/2018   PFIZER(Purple Top)SARS-COV-2 Vaccination 08/09/2019, 09/08/2019, 05/18/2020   Pneumococcal Conjugate-13 11/13/2017   Pneumococcal Polysaccharide-23 06/04/2009, 12/28/2010, 06/11/2019   Td 05/31/2008   Tdap 12/13/2018   Unspecified SARS-COV-2 Vaccination 09/08/2019   Zoster Recombinat (Shingrix) 10/05/2021, 01/31/2022     Objective: Vital Signs:  BP (!) 183/75 (BP Location: Left Arm, Patient Position: Sitting, Cuff Size: Normal)   Pulse 67   Resp 13   Ht '4\' 11"'$  (1.499 m)   Wt 145 lb 3.2 oz (65.9 kg)   BMI 29.33 kg/m    Physical Exam Vitals and nursing note reviewed.  Constitutional:      Appearance: She is well-developed.  HENT:     Head: Normocephalic and atraumatic.  Eyes:     Conjunctiva/sclera: Conjunctivae normal.  Cardiovascular:     Rate and Rhythm: Normal rate and regular rhythm.     Heart sounds: Normal heart sounds.  Pulmonary:     Effort: Pulmonary effort is normal.     Breath sounds: Normal breath sounds.  Abdominal:     General: Bowel sounds are normal.     Palpations: Abdomen is soft.  Musculoskeletal:     Cervical back: Normal range of motion.  Lymphadenopathy:     Cervical: No cervical adenopathy.  Skin:    General: Skin is warm and dry.     Capillary Refill: Capillary refill takes less than 2 seconds.  Neurological:     Mental Status: She is alert and oriented to person, place,  and time.  Psychiatric:        Behavior: Behavior normal.      Musculoskeletal Exam: Cervical spine was in good range of motion.  She had no tenderness over thoracic or lumbar spine.  Shoulder joints, elbow joints, wrist joints with good range of motion.  She had bilateral DIP thickening.  Hip joints and knee joints in good range of motion.  She had no tenderness over ankles or MTPs.  CDAI Exam: CDAI Score: -- Patient Global: --; Provider Global: -- Swollen: --; Tender: -- Joint Exam 03/14/2022   No joint exam has been documented for this visit   There is currently no information documented on the homunculus. Go to the Rheumatology activity and complete the homunculus joint exam.  Investigation: No additional findings.  Imaging: No results found.  Recent Labs: Lab Results  Component Value Date   WBC 4.1 02/28/2022   HGB 11.9 (L) 02/28/2022   PLT 190 02/28/2022   NA 141 02/28/2022   K 3.8 02/28/2022   CL  105 02/28/2022   CO2 32 02/28/2022   GLUCOSE 115 (H) 02/28/2022   BUN 14 02/28/2022   CREATININE 0.92 02/28/2022   BILITOT 0.4 02/28/2022   ALKPHOS 65 02/28/2022   AST 21 02/28/2022   ALT 16 02/28/2022   PROT 7.9 02/28/2022   ALBUMIN 4.3 02/28/2022   CALCIUM 9.6 02/28/2022   GFRAA 85 05/26/2020   July 07, 2021 chest x-ray was normal  Speciality Comments: No specialty comments available.  Procedures:  No procedures performed Allergies: Fosamax [alendronate sodium], Omeprazole, Motrin [ibuprofen], and Fluvastatin   Assessment / Plan:     Visit Diagnoses: Pulmonary sarcoidosis ([place]) - +RF, +ANA: Patient denies any shortness of breath.  Her chest was clear to auscultation.  She had a chest x-ray on July 07, 2021 which was within normal limits.  She is not followed by pulmonologist now.  Primary osteoarthritis of both hands-she continues to have some pain and stiffness in her joints.  DIP thickening was noted.  No synovitis was noted.  Joint protection was discussed.  Pain in left hip -she has intermittent discomfort in the left trochanteric region.  X-ray was unremarkable in the past.  Primary osteoarthritis of both knees-she can have intermittent discomfort in her knee joints.  No warmth swelling or effusion was noted.  She has been exercising on a regular basis.  DDD (degenerative disc disease), cervical-she continues to have some stiffness in her cervical region.  She had good range of motion of the cervical spine without discomfort.  Osteopenia of multiple sites -I reviewed her DEXA scan from August 14, 2021 DEXA scan The BMD measured at Femur Neck Right is 0.795 g/cm2 with a score of -1.7.  Use of calcium rich diet echo vitamin D was discussed.  She has been exercising.  History of vitamin D deficiency-she takes vitamin a supplement.  History of hypertension-blood pressure was initially elevated but repeat blood pressure improved.  Advised to monitor blood pressure  closely.  History of diabetes mellitus  History of hyperlipidemia  Malignant neoplasm of upper-outer quadrant of right breast in female, estrogen receptor positive ([place]) - 2020 s/p lumpectomy and RTX.  She is followed by oncology.  History of sleep apnea - Uses CPAP machine  Orders: No orders of the defined types were placed in this encounter.  No orders of the defined types were placed in this encounter.    Follow-Up Instructions: Return in about 1 year (around 03/15/2023) for Sarcoidosis, Osteoarthritis.  Bo Merino, MD  Note - This record has been created using Editor, commissioning.  Chart creation errors have been sought, but may not always  have been located. Such creation errors do not reflect on  the standard of medical care.

## 2022-03-01 DIAGNOSIS — Z1211 Encounter for screening for malignant neoplasm of colon: Secondary | ICD-10-CM | POA: Diagnosis not present

## 2022-03-01 DIAGNOSIS — R32 Unspecified urinary incontinence: Secondary | ICD-10-CM | POA: Diagnosis not present

## 2022-03-01 DIAGNOSIS — Z1239 Encounter for other screening for malignant neoplasm of breast: Secondary | ICD-10-CM | POA: Diagnosis not present

## 2022-03-01 DIAGNOSIS — Z6829 Body mass index (BMI) 29.0-29.9, adult: Secondary | ICD-10-CM | POA: Diagnosis not present

## 2022-03-01 DIAGNOSIS — M8580 Other specified disorders of bone density and structure, unspecified site: Secondary | ICD-10-CM | POA: Diagnosis not present

## 2022-03-01 DIAGNOSIS — Z853 Personal history of malignant neoplasm of breast: Secondary | ICD-10-CM | POA: Diagnosis not present

## 2022-03-01 DIAGNOSIS — L304 Erythema intertrigo: Secondary | ICD-10-CM | POA: Diagnosis not present

## 2022-03-06 NOTE — Progress Notes (Unsigned)
Cardiology Office Note:    Date:  03/07/2022   ID:  Mariah Burns, DOB Sep 04, 1945, MRN [id number]  PCP:  Glendale Chard, MD  Cardiologist:  Sinclair Grooms, MD   Referring MD: Glendale Chard, MD   Chief Complaint  Patient presents with   Hyperlipidemia   Hypertension   Sarcoidosis    History of Present Illness:    Mariah Burns is a 76 y.o. female with a hx of mild LVH, sarcoidosis, mild pulmonary hypertension, primary hypertension, hyperlipidemia, obstructive sleep apnea on CPAP, breast (right) and diabetes.mellitus type II, and chronic diastolic heart failure. Recent diagnosis of left breast cancer  The patient is doing well.  She exercises more than 150 minutes/week.  She has no signs or symptoms of pulmonary hypertension.  She is being well treated for primary hypertension, sleep apnea, and diabetes mellitus.  Last echo did demonstrate mild LVH.  She has not had an imaging study done since 2020.  She is able to lie flat.  She has no exertional intolerance.  She denies chest pain.  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: failed Imuran & Prednisone therapy d/t adverse side effects TLC 106%, DLCO 75% 2009    Past Surgical History:  Procedure Laterality Date   BREAST LUMPECTOMY WITH RADIOACTIVE SEED AND SENTINEL LYMPH NODE BIOPSY Right 09/02/2018   Procedure: RIGHT BREAST LUMPECTOMY WITH RADIOACTIVE SEED AND  RIGHT SENTINEL LYMPH NODE BIOPSY;  Surgeon: Excell Seltzer, MD;  Location: [facility];  Service: General;  Laterality: Right;   BRONCHOSCOPY     dx'd sarcoid   KNEE ARTHROPLASTY Left 03/13/2017   NEUROPLASTY / TRANSPOSITION ULNAR NERVE AT ELBOW  09/2011   TOTAL ABDOMINAL HYSTERECTOMY       Current Medications: Current Meds  Medication Sig   aspirin EC 81 MG tablet Take 81 mg by mouth daily. Swallow whole.   CALCIUM PO Take by mouth daily.   Cholecalciferol (VITAMIN D-3 PO) Take 1,000 Units by mouth daily.   Cholecalciferol 50 MCG (2000 UT) CAPS Take 50 mcg by mouth daily at 6 (six) AM.   Cyanocobalamin (VITAMIN B-12 PO)    famotidine (PEPCID) 40 MG tablet as needed.   felodipine (PLENDIL) 10 MG 24 hr tablet TAKE 1 TABLET BY MOUTH EVERY DAY   fish oil-omega-3 fatty acids 1000 MG capsule Take 2 capsules by mouth daily.   Fluocinolone Acetonide Scalp 0.01 % OIL Apply to affected area of scalp as directed 1-2 x/day as needed for itching   fluvastatin XL (LESCOL XL) 80 MG 24 hr tablet Take 1 tablet by mouth daily at 6 (six) AM.   GLUCOSAMINE-CHONDROITIN PO Take 2 tablets by mouth daily.   L-Methylfolate-Algae-B12-B6 (METANX) 3-90.314-2-35 MG CAPS Take 1 capsule by mouth 2 (two) times daily.    Magnesium 250 MG TABS Take by mouth daily. Tues, Thursday, Saturday   Menthol, Topical Analgesic, (BIOFREEZE ROLL-ON EX) Apply 1 application. topically as needed (pain in back,neck,thumb).   nitroGLYCERIN (NITROSTAT) 0.4 MG SL tablet Place 1 tablet (0.4 mg total) under the tongue every 5 (five) minutes as needed for chest pain.   Polyvinyl Alcohol-Povidone PF 1.4-0.6 % SOLN Place  1 drop into both eyes daily as needed (dry eyes).    pravastatin (PRAVACHOL) 80 MG tablet Take 80 mg by mouth daily.   TART CHERRY PO    Current Facility-Administered Medications for the 03/07/22 encounter (Office Visit) with Belva Crome, MD  Medication   0.9 %  sodium chloride infusion     Allergies:   Fosamax [alendronate sodium], Omeprazole, Motrin [ibuprofen], and Fluvastatin   Social History   Socioeconomic History   Marital status: Divorced    Spouse name: Not on file   Number of children: 3   Years of education: College   Highest education level: Not on file  Occupational History    Occupation: Retired    Fish farm manager: RETIRED  Tobacco Use   Smoking status: Never   Smokeless tobacco: Never  Vaping Use   Vaping Use: Never used  Substance and Sexual Activity   Alcohol use: No   Drug use: No   Sexual activity: Not Currently  Other Topics Concern   Not on file  Social History Narrative   05/23/21 Patient lives at home with youngest daughter.   Caffeine Use: none   Social Determinants of Health   Financial Resource Strain: Low Risk  (12/06/2021)   Overall Financial Resource Strain (CARDIA)    Difficulty of Paying Living Expenses: Not hard at all  Food Insecurity: No Food Insecurity (12/06/2021)   Hunger Vital Sign    Worried About Running Out of Food in the Last Year: Never true    Ran Out of Food in the Last Year: Never true  Transportation Needs: No Transportation Needs (12/06/2021)   PRAPARE - Hydrologist (Medical): No    Lack of Transportation (Non-Medical): No  Physical Activity: Sufficiently Active (12/06/2021)   Exercise Vital Sign    Days of Exercise per Week: 5 days    Minutes of Exercise per Session: 60 min  Stress: No Stress Concern Present (12/06/2021)   [place]    Feeling of Stress : Not at all  Social Connections: Not on file     Family History: The patient's family history includes Allergies in her daughter, mother, and sister; Gout in her brother and brother; Healthy in her daughter and daughter; Heart disease in her brother, sister, and sister; Heart failure in her father; Hypertension in her brother, brother, brother, and brother; Lupus in her sister; Post-traumatic stress disorder in her brother.  ROS:   Please see the history of present illness.    No orthopnea, PND, claudication, or edema.  All other systems reviewed and are negative.  EKGs/Labs/Other Studies Reviewed:    The following studies were reviewed today:   Chest CT scan  2008: IMPRESSION:  Progression of pulmonary parenchymal disease with increasing nodules and early apical fibrosis on the left.   2 D Doppler ECHOCARDIOGRAM 2020: IMPRESSIONS   1. The left ventricle has hyperdynamic systolic function, with an  ejection fraction of >65%. The cavity size was normal. Left ventricular  diastolic Doppler parameters are consistent with pseudonormalization.  Elevated left atrial and left ventricular  end-diastolic pressures The E/e' is 19. No evidence of left ventricular  regional wall motion abnormalities.   2. The right ventricle has normal systolic function. The cavity was  normal. There is no increase in right ventricular wall thickness. Right  ventricular systolic pressure is normal.   3. Left atrial size was mildly dilated.   4. Small pericardial effusion.  5. The pericardial effusion is circumferential.   6. No evidence of mitral valve stenosis.   7. Tricuspid valve regurgitation is moderate.   8. The aortic valve is tricuspid. Aortic valve regurgitation is trivial  by color flow Doppler. No stenosis of the aortic valve.   9. The aorta is normal unless otherwise noted.  10. The aortic root, ascending aorta and aortic arch are normal in size  and structure  EKG:  EKG normal sinus rhythm, right bundle, normal PR.  When compared to prior tracing February 2023, no significant changes noted.  Recent Labs: 02/28/2022: ALT 16; BUN 14; Creatinine 0.92; Hemoglobin 11.9; Platelet Count 190; Potassium 3.8; Sodium 141  Recent Lipid Panel    Component Value Date/Time   CHOL 169 01/31/2022 1051   TRIG 63 01/31/2022 1051   HDL 61 01/31/2022 1051   CHOLHDL 2.8 01/31/2022 1051   LDLCALC 96 01/31/2022 1051    Physical Exam:    VS:  BP (!) 134/58   Pulse 65   Ht '4\' 11"'$  (1.499 m)   Wt 144 lb 12.8 oz (65.7 kg)   SpO2 98%   BMI 29.25 kg/m     Wt Readings from Last 3 Encounters:  03/07/22 144 lb 12.8 oz (65.7 kg)  02/28/22 145 lb 12.8 oz (66.1 kg)   01/31/22 145 lb (65.8 kg)     GEN: Overweight. No acute distress HEENT: Normal NECK: No JVD. LYMPHATICS: No lymphadenopathy CARDIAC: No murmur. RRR S4 but no S3 gallop, or edema. VASCULAR:  Normal Pulses. No bruits. RESPIRATORY:  Clear to auscultation without rales, wheezing or rhonchi  ABDOMEN: Soft, non-tender, non-distended, No pulsatile mass, MUSCULOSKELETAL: No deformity  SKIN: Warm and dry NEUROLOGIC:  Alert and oriented x 3 PSYCHIATRIC:  Normal affect   ASSESSMENT:    1. Chronic diastolic heart failure ([place])   2. Pericardial effusion   3. Essential hypertension   4. Incomplete right bundle branch block   5. Sarcoidosis   6. Type 2 diabetes mellitus with complication, without long-term current use of insulin (HCC)   7. Hyperlipidemia with target LDL less than 70    PLAN:    In order of problems listed above:  No clinical evidence of volume overload.  Dyspnea on exertion is not a particular problem.  Therefore, SGLT2 therapy is never been instituted. Possibly related to sarcoid Excellent blood pressure control on current therapy with Plendil Unchanged Not followed from cardiac standpoint.  She does have conduction abnormality but her EKG is stable.  Chest CT as noted above showed evidence of pulmonary scarring in 2008.  No follow-up. If she needs additional therapy for glucose control, an SGLT2 would be ideal for her given her known diastolic dysfunction. Continue Lescol.  1 year follow-up with cardiologist for longitudinal CV risk coaching  Medication Adjustments/Labs and Tests Ordered: Current medicines are reviewed at length with the patient today.  Concerns regarding medicines are outlined above.  Orders Placed This Encounter  Procedures   EKG 12-Lead   No orders of the defined types were placed in this encounter.   Patient Instructions  Medication Instructions:  Your physician recommends that you continue on your current medications as directed. Please  refer to the Current Medication list given to you today.  *If you need a refill on your cardiac medications before your next appointment, please call your pharmacy*  Lab Work: NONE  Testing/Procedures: NONE  Follow-Up: At [facility], you and your health needs are our priority.  As part of  our continuing mission to provide you with exceptional heart care, we have created designated Provider Care Teams.  These Care Teams include your primary Cardiologist (physician) and Advanced Practice Providers (APPs -  Physician Assistants and Nurse Practitioners) who all work together to provide you with the care you need, when you need it.  Your next appointment:   1 year(s)  The format for your next appointment:   In Person  Provider:   Sinclair Grooms, MD   Important Information About Sugar         Signed, Sinclair Grooms, MD  03/07/2022 3:35 PM    [place]

## 2022-03-07 ENCOUNTER — Ambulatory Visit: Payer: Medicare PPO | Attending: Interventional Cardiology | Admitting: Interventional Cardiology

## 2022-03-07 ENCOUNTER — Encounter: Payer: Self-pay | Admitting: Interventional Cardiology

## 2022-03-07 VITALS — BP 134/58 | HR 65 | Ht 59.0 in | Wt 144.8 lb

## 2022-03-07 DIAGNOSIS — D869 Sarcoidosis, unspecified: Secondary | ICD-10-CM

## 2022-03-07 DIAGNOSIS — I5032 Chronic diastolic (congestive) heart failure: Secondary | ICD-10-CM | POA: Diagnosis not present

## 2022-03-07 DIAGNOSIS — E118 Type 2 diabetes mellitus with unspecified complications: Secondary | ICD-10-CM

## 2022-03-07 DIAGNOSIS — E785 Hyperlipidemia, unspecified: Secondary | ICD-10-CM

## 2022-03-07 DIAGNOSIS — I10 Essential (primary) hypertension: Secondary | ICD-10-CM | POA: Diagnosis not present

## 2022-03-07 DIAGNOSIS — I4510 Unspecified right bundle-branch block: Secondary | ICD-10-CM

## 2022-03-07 DIAGNOSIS — I3139 Other pericardial effusion (noninflammatory): Secondary | ICD-10-CM | POA: Diagnosis not present

## 2022-03-07 NOTE — Patient Instructions (Signed)
Medication Instructions:  Your physician recommends that you continue on your current medications as directed. Please refer to the Current Medication list given to you today.  *If you need a refill on your cardiac medications before your next appointment, please call your pharmacy*  Lab Work: NONE  Testing/Procedures: NONE  Follow-Up: At [facility], you and your health needs are our priority.  As part of our continuing mission to provide you with exceptional heart care, we have created designated Provider Care Teams.  These Care Teams include your primary Cardiologist (physician) and Advanced Practice Providers (APPs -  Physician Assistants and Nurse Practitioners) who all work together to provide you with the care you need, when you need it.  Your next appointment:   1 year(s)  The format for your next appointment:   In Person  Provider:   Henry W Smith III, MD    Important Information About Sugar       

## 2022-03-13 ENCOUNTER — Ambulatory Visit (INDEPENDENT_AMBULATORY_CARE_PROVIDER_SITE_OTHER): Payer: Medicare PPO | Admitting: Internal Medicine

## 2022-03-13 ENCOUNTER — Encounter: Payer: Self-pay | Admitting: Internal Medicine

## 2022-03-13 VITALS — BP 132/70 | HR 60 | Temp 98.1°F | Ht 59.0 in | Wt 144.8 lb

## 2022-03-13 DIAGNOSIS — Z23 Encounter for immunization: Secondary | ICD-10-CM

## 2022-03-13 DIAGNOSIS — Z170 Estrogen receptor positive status [ER+]: Secondary | ICD-10-CM | POA: Diagnosis not present

## 2022-03-13 DIAGNOSIS — I130 Hypertensive heart and chronic kidney disease with heart failure and stage 1 through stage 4 chronic kidney disease, or unspecified chronic kidney disease: Secondary | ICD-10-CM | POA: Diagnosis not present

## 2022-03-13 DIAGNOSIS — C50411 Malignant neoplasm of upper-outer quadrant of right female breast: Secondary | ICD-10-CM | POA: Diagnosis not present

## 2022-03-13 DIAGNOSIS — Z0000 Encounter for general adult medical examination without abnormal findings: Secondary | ICD-10-CM

## 2022-03-13 DIAGNOSIS — G3184 Mild cognitive impairment, so stated: Secondary | ICD-10-CM

## 2022-03-13 DIAGNOSIS — E1122 Type 2 diabetes mellitus with diabetic chronic kidney disease: Secondary | ICD-10-CM | POA: Diagnosis not present

## 2022-03-13 DIAGNOSIS — N182 Chronic kidney disease, stage 2 (mild): Secondary | ICD-10-CM

## 2022-03-13 NOTE — Patient Instructions (Signed)

## 2022-03-13 NOTE — Progress Notes (Signed)
Rich Brave [place],acting as a Education administrator for [name], MD.,have documented all relevant documentation on the behalf of [name], MD,as directed by  [name], MD while in the presence of [name], MD.   Subjective:     Patient ID: Mariah Burns , female    DOB: Sep 19, 1945 , 76 y.o.   MRN: [id number]   Chief Complaint  Patient presents with   Annual Exam   Diabetes   Hypertension    HPI  The patient is here today for physical exam.   She reports compliance with meds. She denies headaches, chest pain and shortness of breath. She does not offer any new complaints.   Diabetes She presents for her follow-up diabetic visit. She has type 2 diabetes mellitus. Her disease course has been stable. There are no hypoglycemic associated symptoms. Pertinent negatives for diabetes include no blurred vision. There are no hypoglycemic complications. Diabetic complications include nephropathy. Risk factors for coronary artery disease include diabetes mellitus, dyslipidemia, hypertension, sedentary lifestyle and post-menopausal. She is compliant with treatment most of the time.  Hypertension This is a chronic problem. The current episode started more than 1 year ago. The problem has been gradually improving since onset. The problem is controlled. Pertinent negatives include no blurred vision.     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: failed Imuran & Prednisone therapy d/t adverse side effects TLC 106%, DLCO 75% 2009     Family History  Problem Relation Age of Onset   Allergies Sister    Heart disease Sister    Lupus Sister    Allergies Mother    Allergies Daughter    Healthy Daughter    Heart disease Sister     Gout Brother    Hypertension Brother    Heart disease Brother    Heart failure Father    Gout Brother    Hypertension Brother    Hypertension Brother    Hypertension Brother    Post-traumatic stress disorder Brother    Healthy Daughter      Current Outpatient Medications:    aspirin EC 81 MG tablet, Take 81 mg by mouth daily. Swallow whole., Disp: , Rfl:    CALCIUM PO, Take by mouth daily., Disp: , Rfl:    Cholecalciferol (VITAMIN D-3 PO), Take 1,000 Units by mouth daily., Disp: , Rfl:    Cyanocobalamin (VITAMIN B-12 PO), , Disp: , Rfl:    famotidine (PEPCID) 40 MG tablet, as needed., Disp: , Rfl:    felodipine (PLENDIL) 10 MG 24 hr tablet, TAKE 1 TABLET BY MOUTH EVERY DAY, Disp: 90 tablet, Rfl: 2   fish oil-omega-3 fatty acids 1000 MG capsule, Take 2 capsules by mouth daily., Disp: , Rfl:    Fluocinolone Acetonide Scalp 0.01 % OIL, Apply to affected area of scalp as directed 1-2 x/day as needed for itching, Disp: 118.28 mL, Rfl: 1   GLUCOSAMINE-CHONDROITIN PO, Take 2 tablets by mouth daily., Disp: , Rfl:    L-Methylfolate-Algae-B12-B6 (METANX) 3-90.314-2-35 MG CAPS, Take 1 capsule by mouth 2 (two) times daily. , Disp: , Rfl:    Magnesium 250 MG TABS, Take by mouth daily.  Tues, Thursday, Saturday, Disp: , Rfl:    Menthol, Topical Analgesic, (BIOFREEZE ROLL-ON EX), Apply 1 application. topically as needed (pain in back,neck,thumb)., Disp: , Rfl:    nitroGLYCERIN (NITROSTAT) 0.4 MG SL tablet, Place 1 tablet (0.4 mg total) under the tongue every 5 (five) minutes as needed for chest pain., Disp: 25 tablet, Rfl: 2   Polyvinyl Alcohol-Povidone PF 1.4-0.6 % SOLN, Place 1 drop into both eyes daily as needed (dry eyes). , Disp: , Rfl:    TART CHERRY PO, , Disp: , Rfl:    atorvastatin (LIPITOR) 40 MG tablet, Take one tablet by mouth on Monday-Friday, skip weekends., Disp: 30 tablet, Rfl: 3  Current Facility-Administered Medications:    0.9 %  sodium chloride infusion, , Intravenous, PRN, Minette Brine, FNP   Allergies  Allergen Reactions   Fosamax [Alendronate Sodium] Other (See Comments)    REACTION: "CAUSE GI UPSET AND FATIGUE"   Omeprazole Other (See Comments)    Lower ab pain   Motrin [Ibuprofen] Other (See Comments)    GI upset- has to eat prior to taking   Fluvastatin Other (See Comments)    mild memoryproblems of confusion       The patient states she uses post menopausal status for birth control. Last LMP was No LMP recorded. Patient has had a hysterectomy.. Negative for Dysmenorrhea. Negative for: breast discharge, breast lump(s), breast pain and breast self exam. Associated symptoms include abnormal vaginal bleeding. Pertinent negatives include abnormal bleeding (hematology), anxiety, decreased libido, depression, difficulty falling sleep, dyspareunia, history of infertility, nocturia, sexual dysfunction, sleep disturbances, urinary incontinence, urinary urgency, vaginal discharge and vaginal itching. Diet regular.The patient states her exercise level is  intermittent.   . The patient's tobacco use is:  Social History   Tobacco Use  Smoking Status Never   Passive exposure: Never  Smokeless Tobacco Never  . She has been exposed to passive smoke. The patient's alcohol use is:  Social History   Substance and Sexual Activity  Alcohol Use No   Review of Systems  Constitutional: Negative.   HENT: Negative.    Eyes: Negative.  Negative for blurred vision.  Respiratory: Negative.    Cardiovascular: Negative.   Gastrointestinal: Negative.   Endocrine: Negative.   Genitourinary: Negative.   Musculoskeletal: Negative.   Skin: Negative.   Allergic/Immunologic: Negative.   Neurological: Negative.   Hematological: Negative.   Psychiatric/Behavioral: Negative.       Today's Vitals   03/13/22 1116  BP: 132/70  Pulse: 60  Temp: 98.1 F (36.7 C)  Weight: 144 lb 12.8 oz (65.7 kg)  Height: '4\' 11"'$  (1.499 m)  PainSc: 0-No pain   Body mass index is 29.25 kg/m.   Wt Readings from Last 3 Encounters:  03/14/22 145 lb 3.2 oz (65.9 kg)  03/13/22 144 lb 12.8 oz (65.7 kg)  03/07/22 144 lb 12.8 oz (65.7 kg)     Objective:  Physical Exam Vitals and nursing note reviewed.  Constitutional:      Appearance: Normal appearance.  HENT:     Head: Normocephalic and atraumatic.     Right Ear: Tympanic membrane, ear canal and external ear normal.     Left Ear: Tympanic membrane, ear canal and external ear normal.     Nose:     Comments: MASKED     Mouth/Throat:     Comments: MASKED  Eyes:     Extraocular Movements: Extraocular movements intact.     Conjunctiva/sclera: Conjunctivae normal.     Pupils: Pupils  are equal, round, and reactive to light.  Cardiovascular:     Rate and Rhythm: Normal rate and regular rhythm.     Pulses:          Dorsalis pedis pulses are 1+ on the right side and 1+ on the left side.     Heart sounds: Normal heart sounds.  Pulmonary:     Effort: Pulmonary effort is normal.     Breath sounds: Normal breath sounds.  Chest:  Breasts:    Tanner Score is 5.     Right: Normal.     Left: Normal.     Comments: Healed surgical scar on right breast Abdominal:     General: Abdomen is flat. Bowel sounds are normal.     Palpations: Abdomen is soft.  Genitourinary:    Comments: deferred Musculoskeletal:        General: Normal range of motion.     Cervical back: Normal range of motion and neck supple.  Feet:     Right foot:     Protective Sensation: 5 sites tested.  5 sites sensed.     Skin integrity: Dry skin present.     Toenail Condition: Right toenails are normal.     Left foot:     Protective Sensation: 5 sites tested.  5 sites sensed.     Skin integrity: Dry skin present.     Toenail Condition: Left toenails are normal.  Skin:    General: Skin is warm and dry.  Neurological:     General: No focal deficit present.     Mental Status: She is alert and oriented to person, place, and time.  Psychiatric:        Mood and  Affect: Mood normal.        Behavior: Behavior normal.      Assessment And Plan:     1. Encounter for general adult medical examination w/o abnormal findings Comments: A full exam was performed. Importance of monthly self breast exams was discussed with the patient. PATIENT IS ADVISED TO GET 30-45 MINUTES REGULAR EXERCISE NO LESS THAN FOUR TO FIVE DAYS PER WEEK - BOTH WEIGHTBEARING EXERCISES AND AEROBIC ARE RECOMMENDED.  PATIENT IS ADVISED TO FOLLOW A HEALTHY DIET WITH AT LEAST SIX FRUITS/VEGGIES PER DAY, DECREASE INTAKE OF RED MEAT, AND TO INCREASE FISH INTAKE TO TWO DAYS PER WEEK.  MEATS/FISH SHOULD NOT BE FRIED, BAKED OR BROILED IS PREFERABLE.  IT IS ALSO IMPORTANT TO CUT BACK ON YOUR SUGAR INTAKE. PLEASE AVOID ANYTHING WITH ADDED SUGAR, CORN SYRUP OR OTHER SWEETENERS. IF YOU MUST USE A SWEETENER, YOU CAN TRY STEVIA. IT IS ALSO IMPORTANT TO AVOID ARTIFICIALLY SWEETENERS AND DIET BEVERAGES. LASTLY, I SUGGEST WEARING SPF 50 SUNSCREEN ON EXPOSED PARTS AND ESPECIALLY WHEN IN THE DIRECT SUNLIGHT FOR AN EXTENDED PERIOD OF TIME.  PLEASE AVOID FAST FOOD RESTAURANTS AND INCREASE YOUR WATER INTAKE.   2. Type 2 diabetes mellitus with stage 2 chronic kidney disease, without long-term current use of insulin (HCC) Comments: Chronic, diabetic foot exam was performed.  She will rto in 4 months for re-evaluation.  I DISCUSSED WITH THE PATIENT AT LENGTH REGARDING THE GOALS OF GLYCEMIC CONTROL AND POSSIBLE LONG-TERM COMPLICATIONS.  I  ALSO STRESSED THE IMPORTANCE OF COMPLIANCE WITH HOME GLUCOSE MONITORING, DIETARY RESTRICTIONS INCLUDING AVOIDANCE OF SUGARY DRINKS/PROCESSED FOODS,  ALONG WITH REGULAR EXERCISE.  I  ALSO STRESSED THE IMPORTANCE OF ANNUAL EYE EXAMS, SELF FOOT CARE AND COMPLIANCE WITH OFFICE VISITS.  - Urine microalbumin-creatinine with uACR - Lipid  panel - Hemoglobin A1c  3. Hypertensive heart and renal disease with renal failure, stage 1 through stage 4 or unspecified chronic kidney disease, with heart  failure (HCC) Comments: Chronic, fair control. Optimal BP is less than 120/80. She wil c/w felodipine '10mg'$  daily for now.  - Lipid panel  4. Malignant neoplasm of upper-outer quadrant of right breast in female, estrogen receptor positive ([place]) Comments: Diagnosed in 08/2018. S/p right lumpectomy with SLNB on 09/02/18 adjuvant Radiation therapy. She did not tolerate antiestrogen tx. Under surveillance tx.   5. Mild cognitive impairment Comments: She does not wish to take medication, Neuro notes reviewed. Importance of regular exercise was discussed with the patient.   6. Immunization due - Flu Vaccine QUAD High Dose(Fluad)   Patient was given opportunity to ask questions. Patient verbalized understanding of the plan and was able to repeat key elements of the plan. All questions were answered to their satisfaction.   I, [name], MD, have reviewed all documentation for this visit. The documentation on 03/24/22 for the exam, diagnosis, procedures, and orders are all accurate and complete.   THE PATIENT IS ENCOURAGED TO PRACTICE SOCIAL DISTANCING DUE TO THE COVID-19 PANDEMIC.

## 2022-03-14 ENCOUNTER — Ambulatory Visit: Payer: Medicare PPO | Attending: Rheumatology | Admitting: Rheumatology

## 2022-03-14 ENCOUNTER — Encounter: Payer: Self-pay | Admitting: Rheumatology

## 2022-03-14 VITALS — BP 148/64 | HR 67 | Resp 13 | Ht 59.0 in | Wt 145.2 lb

## 2022-03-14 DIAGNOSIS — C50411 Malignant neoplasm of upper-outer quadrant of right female breast: Secondary | ICD-10-CM | POA: Diagnosis not present

## 2022-03-14 DIAGNOSIS — M19041 Primary osteoarthritis, right hand: Secondary | ICD-10-CM | POA: Diagnosis not present

## 2022-03-14 DIAGNOSIS — M8589 Other specified disorders of bone density and structure, multiple sites: Secondary | ICD-10-CM

## 2022-03-14 DIAGNOSIS — Z8669 Personal history of other diseases of the nervous system and sense organs: Secondary | ICD-10-CM

## 2022-03-14 DIAGNOSIS — D860 Sarcoidosis of lung: Secondary | ICD-10-CM | POA: Diagnosis not present

## 2022-03-14 DIAGNOSIS — Z8679 Personal history of other diseases of the circulatory system: Secondary | ICD-10-CM | POA: Diagnosis not present

## 2022-03-14 DIAGNOSIS — M25552 Pain in left hip: Secondary | ICD-10-CM

## 2022-03-14 DIAGNOSIS — Z170 Estrogen receptor positive status [ER+]: Secondary | ICD-10-CM

## 2022-03-14 DIAGNOSIS — M19042 Primary osteoarthritis, left hand: Secondary | ICD-10-CM

## 2022-03-14 DIAGNOSIS — M5030 Other cervical disc degeneration, unspecified cervical region: Secondary | ICD-10-CM | POA: Diagnosis not present

## 2022-03-14 DIAGNOSIS — Z8639 Personal history of other endocrine, nutritional and metabolic disease: Secondary | ICD-10-CM

## 2022-03-14 DIAGNOSIS — M170 Bilateral primary osteoarthritis of knee: Secondary | ICD-10-CM | POA: Diagnosis not present

## 2022-03-14 LAB — LIPID PANEL
Chol/HDL Ratio: 2.6 ratio (ref 0.0–4.4)
Cholesterol, Total: 168 mg/dL (ref 100–199)
HDL: 64 mg/dL (ref >39)
LDL Chol Calc (NIH): 88 mg/dL (ref 0–99)
Triglycerides: 86 mg/dL (ref 0–149)
VLDL Cholesterol Cal: 16 mg/dL (ref 5–40)

## 2022-03-14 LAB — HEMOGLOBIN A1C
Est. average glucose Bld gHb Est-mCnc: 123 mg/dL
Hgb A1c MFr Bld: 5.9 % — ABNORMAL HIGH (ref 4.8–5.6)

## 2022-03-14 LAB — MICROALBUMIN / CREATININE URINE RATIO
Creatinine, Urine: 163.8 mg/dL
Microalb/Creat Ratio: 10 mg/g creat (ref 0–29)
Microalbumin, Urine: 16.8 ug/mL

## 2022-03-16 ENCOUNTER — Other Ambulatory Visit: Payer: Self-pay

## 2022-03-16 DIAGNOSIS — E7800 Pure hypercholesterolemia, unspecified: Secondary | ICD-10-CM

## 2022-03-16 MED ORDER — ATORVASTATIN CALCIUM 40 MG PO TABS: ORAL_TABLET | ORAL | 3 refills | Status: DC

## 2022-04-13 ENCOUNTER — Telehealth: Payer: Self-pay

## 2022-04-13 NOTE — Chronic Care Management (AMB) (Signed)
    Called Mariah Burns, No answer, left message of appointment on 04-17-2022 at 9:00 via telephone visit with [name], Pharm D. Notified to have all medications, supplements, blood pressure and/or blood sugar logs available during appointment and to return call if need to reschedule.   Care Gaps: Covid booster overdue  Star Rating Drug: Atorvastatin 40 mg- Last filled 03-16-2022 42 DS [facility]  Any gaps in medications fill history? No  [place] Pharmacist Assistant [phone]

## 2022-04-17 ENCOUNTER — Ambulatory Visit (INDEPENDENT_AMBULATORY_CARE_PROVIDER_SITE_OTHER): Payer: Medicare PPO

## 2022-04-17 VITALS — BP 158/74 | HR 59 | Temp 97.6°F | Ht 59.0 in | Wt 144.0 lb

## 2022-04-17 DIAGNOSIS — R350 Frequency of micturition: Secondary | ICD-10-CM

## 2022-04-17 DIAGNOSIS — I10 Essential (primary) hypertension: Secondary | ICD-10-CM

## 2022-04-17 DIAGNOSIS — R8290 Unspecified abnormal findings in urine: Secondary | ICD-10-CM

## 2022-04-17 DIAGNOSIS — E1122 Type 2 diabetes mellitus with diabetic chronic kidney disease: Secondary | ICD-10-CM

## 2022-04-17 LAB — POCT URINALYSIS DIPSTICK
Bilirubin, UA: NEGATIVE
Blood, UA: NEGATIVE
Glucose, UA: NEGATIVE
Ketones, UA: NEGATIVE
Nitrite, UA: NEGATIVE
Protein, UA: NEGATIVE
Spec Grav, UA: 1.02 (ref 1.010–1.025)
Urobilinogen, UA: 0.2 E.U./dL
pH, UA: 7 (ref 5.0–8.0)

## 2022-04-17 NOTE — Progress Notes (Signed)
Patient presents today for BPC. She is currently taking felodipine '10mg'$ . She has not taken her BP medications today. She states that she usually takes it around 11am after she eats.   BP Readings from Last 3 Encounters:  04/17/22 (!) 158/74  03/14/22 (!) 148/64  03/13/22 132/70   Patient did bring her personal cuff today with reading of 176/66. Explained to the patient the importance of taking medications regularly. Patient will return in one week for [facility] after taking medications daily.

## 2022-04-17 NOTE — Progress Notes (Signed)
Chronic Care Management Pharmacy Note  04/18/2022 Name:  Mariah Burns MRN:  [id number] DOB:  [birth year]  Summary: Patient reports that her BS and BP readings have been very elevated.   Recommendations/Changes made from today's visit: Recommend patient be seen by PCP team.  Recommend patient bring in her BP and Glucose monitors at her office visit with the PCP team on today  Plan: Consulted with PCP team to discuss patients confusion, patient to nurse visit with PCP team today.  Recommended patient continue to check BP and BS daily.     Subjective: Mariah Burns is an 76 y.o. year old female who is a primary patient of Dorothyann Peng, MD.  The CCM team was consulted for assistance with disease management and care coordination needs.    Engaged with patient by telephone for follow up visit in response to provider referral for pharmacy case management and/or care coordination services. Patient reported that she did not want to be seen today, because she is concerned that her BS and BP monitor are not working because the numbers are so skewed.   Consent to Services:  The patient was given information about Chronic Care Management services, agreed to services, and gave verbal consent prior to initiation of services.  Please see initial visit note for detailed documentation.   Patient Care Team: Dorothyann Peng, MD as PCP - General (Internal Medicine) Lyn Records, MD as PCP - Cardiology (Cardiology) Pershing Proud, RN as Oncology Nurse Navigator Donnelly Angelica, RN as Oncology Nurse Navigator Glenna Fellows, MD (Inactive) as Consulting Physician (General Surgery) Malachy Mood, MD as Consulting Physician (Hematology) Antony Blackbird, MD as Consulting Physician (Radiation Oncology) Pollyann Samples, NP as Nurse Practitioner (Nurse Practitioner) Glyn Ade, PA-C as Physician Assistant (Dermatology) Harlan Stains, [facility] (Pharmacist) Chalmers Guest, MD as Consulting  Physician (Ophthalmology)  Recent office visits: 03/13/2022 Cardiology OV 01/31/2022 PCP OV  Recent consult visits: 03/14/2022 Rheumatology OV 03/07/2022 Cardiology OV  02/28/2022 Oncology OV   Hospital visits: None in previous 6 months   Objective:  Lab Results  Component Value Date   CREATININE 0.92 02/28/2022   BUN 14 02/28/2022   EGFR 76 01/31/2022   GFRNONAA >60 02/28/2022   GFRAA 85 05/26/2020   NA 141 02/28/2022   K 3.8 02/28/2022   CALCIUM 9.6 02/28/2022   CO2 32 02/28/2022   GLUCOSE 115 (H) 02/28/2022    Lab Results  Component Value Date/Time   HGBA1C 5.9 (H) 03/13/2022 02:02 PM   HGBA1C 6.0 (H) 01/31/2022 10:51 AM   MICROALBUR 30 02/23/2021 05:06 PM   MICROALBUR 30 02/23/2020 01:26 PM    Last diabetic Eye exam:  Lab Results  Component Value Date/Time   HMDIABEYEEXA No Retinopathy 10/31/2021 12:00 AM    Last diabetic Foot exam: No results found for: "HMDIABFOOTEX"   Lab Results  Component Value Date   CHOL 168 03/13/2022   HDL 64 03/13/2022   LDLCALC 88 03/13/2022   TRIG 86 03/13/2022   CHOLHDL 2.6 03/13/2022       Latest Ref Rng & Units 02/28/2022   12:14 PM 01/31/2022   10:51 AM 08/23/2021   10:14 AM  Hepatic Function  Total Protein 6.5 - 8.1 g/dL 7.9  7.6  7.6   Albumin 3.5 - 5.0 g/dL 4.3  4.5  4.2   AST 15 - 41 U/L 21  20  19    ALT 0 - 44 U/L 16  15  15    Alk Phosphatase  38 - 126 U/L 65  76  63   Total Bilirubin 0.3 - 1.2 mg/dL 0.4  0.5  0.5     Lab Results  Component Value Date/Time   TSH 1.170 11/02/2020 10:36 AM   TSH 1.240 06/11/2019 02:24 PM       Latest Ref Rng & Units 02/28/2022   12:14 PM 01/31/2022   10:51 AM 08/23/2021   10:14 AM  CBC  WBC 4.0 - 10.5 K/uL 4.1  4.2  3.5   Hemoglobin 12.0 - 15.0 g/dL 16.0  10.9  32.3   Hematocrit 36.0 - 46.0 % 36.4  36.9  35.5   Platelets 150 - 400 K/uL 190  189  177     Lab Results  Component Value Date/Time   VD25OH 43.0 10/28/2019 11:37 AM   VITAMINB12 >2000 (H) 02/23/2021 04:11  PM   VITAMINB12 >2000 (H) 11/02/2020 10:36 AM    Clinical ASCVD: No  The ASCVD Risk score (Arnett DK, et al., 2019) failed to calculate for the following reasons:   Unable to determine if patient is Non-Hispanic African American       01/31/2022   10:08 AM 12/06/2021    9:48 AM 10/05/2021   11:58 AM  Depression screen PHQ 2/9  Decreased Interest 0 0 0  Down, Depressed, Hopeless 0 0 0  PHQ - 2 Score 0 0 0  Altered sleeping 0  0  Tired, decreased energy 0  0  Change in appetite 0  0  Feeling bad or failure about yourself  0  0  Trouble concentrating 0  0  Moving slowly or fidgety/restless 0  0  Suicidal thoughts 0  0  PHQ-9 Score 0  0  Difficult doing work/chores Not difficult at all       Social History   Tobacco Use  Smoking Status Never   Passive exposure: Never  Smokeless Tobacco Never   BP Readings from Last 3 Encounters:  04/17/22 (!) 158/74  03/14/22 (!) 148/64  03/13/22 132/70   Pulse Readings from Last 3 Encounters:  04/17/22 (!) 59  03/14/22 67  03/13/22 60   Wt Readings from Last 3 Encounters:  04/17/22 144 lb (65.3 kg)  03/14/22 145 lb 3.2 oz (65.9 kg)  03/13/22 144 lb 12.8 oz (65.7 kg)   BMI Readings from Last 3 Encounters:  04/17/22 29.08 kg/m  03/14/22 29.33 kg/m  03/13/22 29.25 kg/m    Assessment/Interventions: Review of patient past medical history, allergies, medications, health status, including review of consultants reports, laboratory and other test data, was performed as part of comprehensive evaluation and provision of chronic care management services.   SDOH:  (Social Determinants of Health) assessments and interventions performed: Yes SDOH Interventions    Flowsheet Row Office Visit from 06/06/2021 in [facility] Chronic Care Management from 01/03/2021 in [facility] Office Visit from 11/02/2020 in [facility] Clinical Support from 10/28/2019 in [facility] Clinical Support from 10/23/2018 in [facility] Office Visit from 05/15/2018 in [facility]  SDOH Interventions        Food Insecurity Interventions -- Intervention Not Indicated -- -- -- --  Housing Interventions -- Intervention Not Indicated -- -- -- --  Transportation Interventions -- Intervention Not Indicated -- -- -- --  Depression Interventions/Treatment  Patient refuses Treatment -- Patient refuses Treatment PHQ2-9 Score <4 Follow-up Not Indicated PHQ2-9 Score <4 Follow-up Not Indicated Counseling, Medication  [She will c/w  therapy as per SEL group. She admits that she is overwhelmed w/ the care of her Mother's estate, the care of her brother, and she has had two losses in her family. She is agreeable to possibly starting meds]      SDOH Screenings   Food Insecurity: No Food Insecurity (12/06/2021)  Housing: Low Risk  (01/03/2021)  Transportation Needs: No Transportation Needs (12/06/2021)  Depression (PHQ2-9): Low Risk  (01/31/2022)  Financial Resource Strain: Low Risk  (12/06/2021)  Physical Activity: Sufficiently Active (12/06/2021)  Stress: No Stress Concern Present (12/06/2021)  Tobacco Use: Low Risk  (03/14/2022)    CCM Care Plan  Allergies  Allergen Reactions   Fosamax [Alendronate Sodium] Other (See Comments)    REACTION: "CAUSE GI UPSET AND FATIGUE"   Omeprazole Other (See Comments)    Lower ab pain   Motrin [Ibuprofen] Other (See Comments)    GI upset- has to eat prior to taking   Fluvastatin Other (See Comments)    mild memoryproblems of confusion     Medications Reviewed Today     Reviewed by Pollyann Savoy, MD (Physician) on 03/14/22 at 1439  Med List Status: <None>   Medication Order Taking? Sig Documenting Provider Last Dose Status Informant  0.9 %  sodium chloride infusion [id number]   Arnette Felts, FNP  Active   aspirin EC 81 MG tablet [id number] Yes Take 81 mg by mouth daily. Swallow whole. [provider] Taking Active   CALCIUM PO [id number] Yes Take by mouth daily. [provider] Taking Active   Cholecalciferol (VITAMIN D-3 PO) [id number] Yes Take 1,000 Units by mouth daily. [provider] Taking Active   Cyanocobalamin (VITAMIN B-12 PO) [id number] Yes  [provider] Taking Active   famotidine (PEPCID) 40 MG tablet [id number] Yes as needed. [provider] Taking Active   felodipine (PLENDIL) 10 MG 24 hr tablet [id number] Yes TAKE 1 TABLET BY MOUTH EVERY DAY Dorothyann Peng, MD Taking Active   fish oil-omega-3 fatty acids 1000 MG capsule [id number] Yes Take 2 capsules by mouth daily. [provider] Taking Active   Fluocinolone Acetonide Scalp 0.01 % OIL [id number] Yes Apply to affected area of scalp as directed 1-2 x/day as needed for itching Dorothyann Peng, MD Taking Active   GLUCOSAMINE-CHONDROITIN PO [id number] Yes Take 2 tablets by mouth daily. [provider] Taking Active   L-Methylfolate-Algae-B12-B6 Latrelle Dodrill) 3-90.314-2-35 MG CAPS [id number] Yes Take 1 capsule by mouth 2 (two) times daily.  [provider] Taking Active            Med Note Debbe Bales   Tue Aug 19, 2018  9:17 AM)    Magnesium 250 MG TABS [id number] Yes Take by mouth daily. Tues, Thursday, Saturday [provider] Taking Active   Menthol, Topical Analgesic, (BIOFREEZE ROLL-ON EX) [id number] Yes Apply 1 application. topically as needed (pain in back,neck,thumb). [provider] Taking Active   nitroGLYCERIN (NITROSTAT) 0.4 MG SL tablet [id number] Yes Place 1 tablet (0.4 mg total) under the tongue every 5 (five) minutes as needed for chest pain. Leone Brand, NP Taking Active   Polyvinyl Alcohol-Povidone PF 1.4-0.6 % SOLN [id number] Yes Place 1 drop into both eyes daily as needed (dry eyes).  [provider] Taking Active   pravastatin (PRAVACHOL) 80 MG tablet [id number] Yes Take 80 mg by mouth daily. 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/28/2019   Class 1 obesity due to excess calories with serious comorbidity and body mass index (BMI) of 30.0 to 30.9 in adult 10/28/2019   Vitamin D deficiency disease 10/28/2019   Chronic bilateral low back pain without sciatica 10/28/2019   Hip pain, bilateral 10/28/2019   Malignant neoplasm of upper-outer quadrant of right breast in female, estrogen receptor positive (HCC) 08/11/2018   Chronic renal disease, stage II 05/15/2018   Hypertensive nephropathy 05/15/2018   Adult BMI 29.0-29.9 kg/sq m 05/15/2018   Dyspnea on exertion 02/17/2015   Incomplete right bundle branch block 02/17/2015   Systolic murmur 02/17/2015   Pericardial effusion 02/17/2015   Osteopenia 10/05/2011   Type 2 diabetes mellitus with stage 2 chronic kidney disease, without long-term current use of insulin (HCC) 05/06/2010   SARCOIDOSIS 03/21/2007   Hyperlipidemia with target LDL less than 70 03/21/2007   Obstructive sleep apnea 03/21/2007   Essential hypertension 03/21/2007     Immunization History  Administered Date(s) Administered   Fluad Quad(high Dose 65+) 02/12/2019, 02/23/2020, 03/30/2021, 03/13/2022   Influenza Split 04/10/2011, 04/15/2012, 03/04/2013, 03/04/2014, 03/05/2015, 03/04/2017   Influenza Whole 06/13/2010   Influenza,inj,Quad PF,6+ Mos 03/22/2016   Influenza-Unspecified 03/22/2016, 03/04/2018   PFIZER(Purple Top)SARS-COV-2 Vaccination 08/09/2019,  09/08/2019, 05/18/2020   Pneumococcal Conjugate-13 11/13/2017   Pneumococcal Polysaccharide-23 06/04/2009, 12/28/2010, 06/11/2019   Td 05/31/2008   Tdap 12/13/2018   Unspecified SARS-COV-2 Vaccination 09/08/2019   Zoster Recombinat (Shingrix) 10/05/2021, 01/31/2022    Conditions to be addressed/monitored:  Hypertension and Diabetes  Care Plan : CCM Pharmacy Care Plan  Updates made by Harlan Stains, RPH since 04/18/2022 12:00 AM     Problem: HTN, DM   Priority: High     Goal: Patient Stated   Recent Progress: On track  Priority: High  Note:   Current Barriers:  Unable to independently monitor therapeutic efficacy  Pharmacist Clinical Goal(s):  Patient will achieve adherence to monitoring guidelines and medication adherence to achieve therapeutic efficacy through collaboration with PharmD and provider.   Interventions: 1:1 collaboration with Dorothyann Peng, MD regarding development and update of comprehensive plan of care as evidenced by provider attestation and co-signature Inter-disciplinary care team collaboration (see longitudinal plan of care) Comprehensive medication review performed; medication list updated in electronic medical record  Hypertension (BP goal <140/90) -Uncontrolled -Current treatment: Felodipine 10 mg tablet once per day Appropriate, Query effective -Current home readings: 11/13 - 158/79, 118/93 pulse: 64, 170/74 pulse: 64  -Current dietary habits: she has been eating a lot of take out and restaurant food  -Current exercise habits: will discuss during a different visit -Denies hypotensive/hypertensive symptoms -Educated on BP goals and benefits of medications for prevention of heart attack, stroke and kidney damage; Importance of home blood pressure monitoring; Proper BP monitoring technique; Symptoms of hypotension and importance of maintaining adequate hydration; -Recommended patient bring in her BP  monitory with her today in the BP check.   -Counseled to monitor BP at home at least once per day, document, and provide log at future appointments -Recommended to continue  current medication  Diabetes (A1c goal <8%) -Controlled -Current medications: She is not currently taking any medication for her diabetes  -Current home glucose readings fasting glucose: 59, 119  -Denies hypoglycemic/hyperglycemic symptoms -Current meal patterns: patient reports eating a lot of fast food and take out since moving.  -Current exercise: will discuss during next office visit.  -Educated on A1c and blood sugar goals; Prevention and management of hypoglycemic episodes; -Counseled to check feet daily and get yearly eye exams -Recommended to continue current medication  Patient Goals/Self-Care Activities Patient will:  - take medications as prescribed as evidenced by patient report and record review  Follow Up Plan: The patient has been provided with contact information for the care management team and has been advised to call with any health related questions or concerns.  Nurse visit scheduled for 04/17/2022      Medication Assistance: None required.  Patient affirms current coverage meets needs.  Compliance/Adherence/Medication fill history: Care Gaps: COVID-19 Booster   Star-Rating Drugs: Atorvastatin 40 mg tablet   Patient's preferred pharmacy is:  [id number] - [place], [place] - [address] AT [address] OF [facility] [address] [place] [address] Phone: [phone] Fax: [phone]  Uses pill box? Yes Pt endorses 80% compliance  We discussed: Benefits of medication synchronization, packaging and delivery as well as enhanced pharmacist oversight with [employer]. Patient decided to: Continue current medication management strategy  Care Plan and Follow Up Patient Decision:  Patient agrees to Care Plan and Follow-up.  Plan: The patient has been provided with contact information for the care  management team and has been advised to call with any health related questions or concerns.   Cherylin Mylar, CPP, PharmD Clinical Pharmacist Practitioner [facility] [phone]     Lab Results  Component Value Date   CREATININE 0.92 02/28/2022   BUN 14 02/28/2022   EGFR 76 01/31/2022   GFRNONAA >60 02/28/2022   GFRAA 85 05/26/2020   NA 141 02/28/2022   K 3.8 02/28/2022   CALCIUM 9.6 02/28/2022   CO2 32 02/28/2022   GLUCOSE 115 (H) 02/28/2022    Lab Results  Component Value Date/Time   HGBA1C 5.9 (H) 03/13/2022 02:02 PM   HGBA1C 6.0 (H) 01/31/2022 10:51 AM   MICROALBUR 30 02/23/2021 05:06 PM   MICROALBUR 30 02/23/2020 01:26 PM    Last diabetic Eye exam:  Lab Results  Component Value Date/Time   HMDIABEYEEXA No Retinopathy 10/31/2021 12:00 AM    Last diabetic Foot exam: No results found for: "HMDIABFOOTEX"   Lab Results  Component Value Date   CHOL 168 03/13/2022   HDL 64 03/13/2022   LDLCALC 88 03/13/2022   TRIG 86 03/13/2022   CHOLHDL 2.6 03/13/2022       Latest Ref Rng & Units 02/28/2022   12:14 PM 01/31/2022   10:51 AM 08/23/2021   10:14 AM  Hepatic Function  Total Protein 6.5 - 8.1 g/dL 7.9  7.6  7.6   Albumin 3.5 - 5.0 g/dL 4.3  4.5  4.2   AST 15 - 41 U/L 21  20  19    ALT 0 - 44 U/L 16  15  15    Alk Phosphatase 38 - 126 U/L 65  76  63   Total Bilirubin 0.3 - 1.2 mg/dL 0.4  0.5  0.5     Lab Results  Component Value Date/Time   TSH 1.170 11/02/2020 10:36 AM   TSH 1.240 06/11/2019 02:24 PM  Latest Ref Rng & Units 02/28/2022   12:14 PM 01/31/2022   10:51 AM 08/23/2021   10:14 AM  CBC  WBC 4.0 - 10.5 K/uL 4.1  4.2  3.5   Hemoglobin 12.0 - 15.0 g/dL 16.1  09.6  04.5   Hematocrit 36.0 - 46.0 % 36.4  36.9  35.5   Platelets 150 - 400 K/uL 190  189  177     Lab Results  Component Value Date/Time   VD25OH 43.0 10/28/2019 11:37 AM   VITAMINB12 >2000 (H) 02/23/2021 04:11 PM   VITAMINB12 >2000 (H) 11/02/2020 10:36 AM     Clinical ASCVD: No  The ASCVD Risk score (Arnett DK, et al., 2019) failed to calculate for the following reasons:   Unable to determine if patient is Non-Hispanic African American       01/31/2022   10:08 AM 12/06/2021    9:48 AM 10/05/2021   11:58 AM  Depression screen PHQ 2/9  Decreased Interest 0 0 0  Down, Depressed, Hopeless 0 0 0  PHQ - 2 Score 0 0 0  Altered sleeping 0  0  Tired, decreased energy 0  0  Change in appetite 0  0  Feeling bad or failure about yourself  0  0  Trouble concentrating 0  0  Moving slowly or fidgety/restless 0  0  Suicidal thoughts 0  0  PHQ-9 Score 0  0  Difficult doing work/chores Not difficult at all       Social History   Tobacco Use  Smoking Status Never   Passive exposure: Never  Smokeless Tobacco Never   BP Readings from Last 3 Encounters:  04/17/22 (!) 158/74  03/14/22 (!) 148/64  03/13/22 132/70   Pulse Readings from Last 3 Encounters:  04/17/22 (!) 59  03/14/22 67  03/13/22 60   Wt Readings from Last 3 Encounters:  04/17/22 144 lb (65.3 kg)  03/14/22 145 lb 3.2 oz (65.9 kg)  03/13/22 144 lb 12.8 oz (65.7 kg)   BMI Readings from Last 3 Encounters:  04/17/22 29.08 kg/m  03/14/22 29.33 kg/m  03/13/22 29.25 kg/m    Assessment/Interventions: Review of patient past medical history, allergies, medications, health status, including review of consultants reports, laboratory and other test data, was performed as part of comprehensive evaluation and provision of chronic care management services.   SDOH:  (Social Determinants of Health) assessments and interventions performed: No SDOH Interventions    Flowsheet Row Office Visit from 06/06/2021 in [facility] Chronic Care Management from 01/03/2021 in [facility] Office Visit from 11/02/2020 in [facility] Clinical Support from 10/28/2019 in [facility] Clinical Support from 10/23/2018 in [facility] Office Visit from 05/15/2018 in [facility]  SDOH Interventions        Food Insecurity Interventions -- Intervention Not Indicated -- -- -- --  Housing Interventions -- Intervention Not Indicated -- -- -- --  Transportation Interventions -- Intervention Not Indicated -- -- -- --  Depression Interventions/Treatment  Patient refuses Treatment -- Patient refuses Treatment PHQ2-9 Score <4 Follow-up Not Indicated PHQ2-9 Score <4 Follow-up Not Indicated Counseling, Medication  [She will c/w therapy as per SEL group. She admits that she is overwhelmed w/ the care of her Mother's estate, the care of her brother, and she has had two losses in her family. She is agreeable to possibly starting meds]      SDOH Screenings   Food Insecurity: No Food  Insecurity (12/06/2021)  Housing: Low Risk  (01/03/2021)  Transportation Needs: No Transportation Needs (12/06/2021)  Depression (PHQ2-9): Low Risk  (01/31/2022)  Financial Resource Strain: Low Risk  (12/06/2021)  Physical Activity: Sufficiently Active (12/06/2021)  Stress: No Stress Concern Present (12/06/2021)  Tobacco Use: Low Risk  (03/14/2022)    CCM Care Plan  Allergies  Allergen Reactions   Fosamax [Alendronate Sodium] Other (See Comments)    REACTION: "CAUSE GI UPSET AND FATIGUE"   Omeprazole Other (See Comments)    Lower ab pain   Motrin [Ibuprofen] Other (See Comments)    GI upset- has to eat prior to taking   Fluvastatin Other (See Comments)    mild memoryproblems of confusion     Medications Reviewed Today     Reviewed by Pollyann Savoy, MD (Physician) on 03/14/22 at 1439  Med List Status: <None>   Medication Order Taking? Sig Documenting Provider Last Dose Status Informant  0.9 %  sodium chloride infusion [id number]   Arnette Felts, FNP  Active   aspirin EC 81 MG tablet [id number] Yes Take 81 mg by mouth daily. Swallow whole. [provider] Taking Active   CALCIUM PO [id number] Yes  Take by mouth daily. [provider] Taking Active   Cholecalciferol (VITAMIN D-3 PO) [id number] Yes Take 1,000 Units by mouth daily. [provider] Taking Active   Cyanocobalamin (VITAMIN B-12 PO) [id number] Yes  [provider] Taking Active   famotidine (PEPCID) 40 MG tablet [id number] Yes as needed. [provider] Taking Active   felodipine (PLENDIL) 10 MG 24 hr tablet [id number] Yes TAKE 1 TABLET BY MOUTH EVERY DAY Dorothyann Peng, MD Taking Active   fish oil-omega-3 fatty acids 1000 MG capsule [id number] Yes Take 2 capsules by mouth daily. [provider] Taking Active   Fluocinolone Acetonide Scalp 0.01 % OIL [id number] Yes Apply to affected area of scalp as directed 1-2 x/day as needed for itching Dorothyann Peng, MD Taking Active   GLUCOSAMINE-CHONDROITIN PO [id number] Yes Take 2 tablets by mouth daily. [provider] Taking Active   L-Methylfolate-Algae-B12-B6 Latrelle Dodrill) 3-90.314-2-35 MG CAPS [id number] Yes Take 1 capsule by mouth 2 (two) times daily.  [provider] Taking Active            Med Note Debbe Bales   Tue Aug 19, 2018  9:17 AM)    Magnesium 250 MG TABS [id number] Yes Take by mouth daily. Tues, Thursday, Saturday [provider] Taking Active   Menthol, Topical Analgesic, (BIOFREEZE ROLL-ON EX) [id number] Yes Apply 1 application. topically as needed (pain in back,neck,thumb). [provider] Taking Active   nitroGLYCERIN (NITROSTAT) 0.4 MG SL tablet [id number] Yes Place 1 tablet (0.4 mg total) under the tongue every 5 (five) minutes as needed for chest pain. Leone Brand, NP Taking Active   Polyvinyl Alcohol-Povidone PF 1.4-0.6 % SOLN [id number] Yes Place 1 drop into both eyes daily as needed (dry eyes).  [provider] Taking Active   pravastatin (PRAVACHOL) 80 MG tablet [id number] Yes Take 80 mg by mouth daily. 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/28/2019   Class 1 obesity due to excess calories with serious comorbidity and body mass index (BMI) of 30.0 to 30.9 in adult 10/28/2019   Vitamin D deficiency disease 10/28/2019   Chronic bilateral low back pain without sciatica 10/28/2019   Hip pain, bilateral 10/28/2019   Malignant neoplasm of upper-outer quadrant of right breast in female, estrogen receptor positive (HCC) 08/11/2018   Chronic renal disease, stage II 05/15/2018   Hypertensive nephropathy 05/15/2018   Adult BMI 29.0-29.9 kg/sq m 05/15/2018   Dyspnea on exertion 02/17/2015   Incomplete right bundle branch block 02/17/2015   Systolic murmur 02/17/2015   Pericardial effusion 02/17/2015   Osteopenia 10/05/2011   Type 2 diabetes mellitus with stage 2 chronic kidney disease, without long-term current use of insulin (HCC) 05/06/2010   SARCOIDOSIS 03/21/2007   Hyperlipidemia with target LDL less than 70 03/21/2007   Obstructive sleep apnea 03/21/2007   Essential hypertension 03/21/2007    Immunization History  Administered Date(s) Administered   Fluad Quad(high Dose 65+) 02/12/2019, 02/23/2020, 03/30/2021, 03/13/2022   Influenza Split 04/10/2011, 04/15/2012, 03/04/2013, 03/04/2014, 03/05/2015, 03/04/2017   Influenza Whole 06/13/2010   Influenza,inj,Quad PF,6+ Mos 03/22/2016   Influenza-Unspecified 03/22/2016, 03/04/2018   PFIZER(Purple Top)SARS-COV-2 Vaccination 08/09/2019, 09/08/2019, 05/18/2020   Pneumococcal Conjugate-13  11/13/2017   Pneumococcal Polysaccharide-23 06/04/2009, 12/28/2010, 06/11/2019   Td 05/31/2008   Tdap 12/13/2018   Unspecified SARS-COV-2 Vaccination 09/08/2019   Zoster Recombinat (Shingrix) 10/05/2021, 01/31/2022    Conditions to be addressed/monitored:  Hypertension and Diabetes  Care Plan : CCM Pharmacy Care Plan  Updates made by Harlan Stains, RPH since 04/18/2022 12:00 AM     Problem: HTN, DM   Priority: High     Goal: Patient Stated   Recent Progress: On track  Priority: High  Note:   Current Barriers:  Unable to independently monitor therapeutic efficacy  Pharmacist Clinical Goal(s):  Patient will achieve adherence to monitoring guidelines and medication adherence to achieve therapeutic efficacy through collaboration with PharmD and provider.   Interventions: 1:1 collaboration with Dorothyann Peng, MD regarding development and update of comprehensive plan of care as evidenced by provider attestation and co-signature Inter-disciplinary care team collaboration (see longitudinal plan of care) Comprehensive medication review performed; medication list updated in electronic medical record  Hypertension (BP goal <140/90) -Uncontrolled -Current treatment: Felodipine 10 mg tablet once per day Appropriate, Query effective -Current home readings: 11/13 - 158/79, 118/93 pulse: 64, 170/74 pulse: 64  -Current dietary habits: she has been eating a lot of take out and restaurant food  -Current exercise habits: will discuss during a different visit -Denies hypotensive/hypertensive symptoms -Educated on BP goals and benefits of medications for prevention of heart attack, stroke and kidney damage; Importance of home blood pressure monitoring; Proper BP monitoring technique; Symptoms of hypotension and importance of maintaining adequate hydration; -Recommended patient bring in her BP  monitory with her today in the BP check.  -Counseled to monitor BP at home at least once per  day, document, and provide log at future appointments -Recommended to continue current medication  Diabetes (A1c goal <8%) -Controlled -Current medications: She is not currently taking any medication for her diabetes  -Current home glucose readings fasting glucose: 59, 119  -Denies hypoglycemic/hyperglycemic symptoms -Current meal patterns: patient reports eating a lot of fast food and take out since moving.  -Current exercise:  will discuss during next office visit.  -Educated on A1c and blood sugar goals; Prevention and management of hypoglycemic episodes; -Counseled to check feet daily and get yearly eye exams -Recommended to continue current medication  Patient Goals/Self-Care Activities Patient will:  - take medications as prescribed as evidenced by patient report and record review  Follow Up Plan: The patient has been provided with contact information for the care management team and has been advised to call with any health related questions or concerns.  Nurse visit scheduled for 04/17/2022      Medication Assistance: None required.  Patient affirms current coverage meets needs.  Compliance/Adherence/Medication fill history: Care Gaps: COVID-19 Booster   Star-Rating Drugs: Atovastatin 40 mg tablet   Patient's preferred pharmacy is:  [id number] - [place], [place] - [address] AT [address] OF [facility] [address] [place] [address] Phone: [phone] Fax: [phone]  Uses pill box? Yes Pt endorses 80% compliance  We discussed: Benefits of medication synchronization, packaging and delivery as well as enhanced pharmacist oversight with [employer]. Patient decided to: Continue current medication management strategy  Care Plan and Follow Up Patient Decision:  Patient agrees to Care Plan and Follow-up.  Plan: The patient has been provided with contact information for the care management team and has been advised to call with any  health related questions or concerns.   Cherylin Mylar, CPP, PharmD Clinical Pharmacist Practitioner [facility] [phone]

## 2022-04-18 LAB — URINE CULTURE: Organism ID, Bacteria: NO GROWTH

## 2022-04-18 NOTE — Patient Instructions (Signed)
Visit Information It was great speaking with you today!  Please let me know if you have any questions about our visit.   Goals Addressed             This Visit's Progress    Manage My Medicine       Timeframe:  Long-Range Goal Priority:  High Start Date:                             Expected End Date:                       Follow Up Date 05/23/2022    In Progress:  - call for medicine refill 2 or 3 days before it runs out - call if I am sick and can't take my medicine - keep a list of all the medicines I take; vitamins and herbals too - use a pillbox to sort medicine - use an alarm clock or phone to remind me to take my medicine    Why is this important?   These steps will help you keep on track with your medicines.   Notes:  Please call with any questions.         Patient Care Plan: CCM Pharmacy Care Plan     Problem Identified: HTN, DM   Priority: High     Goal: Patient Stated   Recent Progress: On track  Priority: High  Note:   Current Barriers:  Unable to independently monitor therapeutic efficacy  Pharmacist Clinical Goal(s):  Patient will achieve adherence to monitoring guidelines and medication adherence to achieve therapeutic efficacy through collaboration with PharmD and provider.   Interventions: 1:1 collaboration with Glendale Chard, MD regarding development and update of comprehensive plan of care as evidenced by provider attestation and co-signature Inter-disciplinary care team collaboration (see longitudinal plan of care) Comprehensive medication review performed; medication list updated in electronic medical record  Hypertension (BP goal <140/90) -Uncontrolled -Current treatment: Felodipine 10 mg tablet once per day Appropriate, Query effective -Current home readings: 11/13 - 158/79, 118/93 pulse: 64, 170/74 pulse: 64  -Current dietary habits: she has been eating a lot of take out and restaurant food  -Current exercise habits: will discuss  during a different visit -Denies hypotensive/hypertensive symptoms -Educated on BP goals and benefits of medications for prevention of heart attack, stroke and kidney damage; Importance of home blood pressure monitoring; Proper BP monitoring technique; Symptoms of hypotension and importance of maintaining adequate hydration; -Recommended patient bring in her BP  monitory with her today in the BP check.  -Counseled to monitor BP at home at least once per day, document, and provide log at future appointments -Recommended to continue current medication  Diabetes (A1c goal <8%) -Controlled -Current medications: She is not currently taking any medication for her diabetes  -Current home glucose readings fasting glucose: 59, 119  -Denies hypoglycemic/hyperglycemic symptoms -Current meal patterns: patient reports eating a lot of fast food and take out since moving.  -Current exercise: will discuss during next office visit.  -Educated on A1c and blood sugar goals; Prevention and management of hypoglycemic episodes; -Counseled to check feet daily and get yearly eye exams -Recommended to continue current medication  Patient Goals/Self-Care Activities Patient will:  - take medications as prescribed as evidenced by patient report and record review  Follow Up Plan: The patient has been provided with contact information for the care management team and has been advised  to call with any health related questions or concerns.  Nurse visit scheduled for 04/17/2022      Patient agreed to services and verbal consent obtained.   The patient verbalized understanding of instructions, educational materials, and care plan provided today and agreed to receive a mailed copy of patient instructions, educational materials, and care plan.   [name], PharmD Clinical Pharmacist [facility] [phone]

## 2022-04-24 ENCOUNTER — Other Ambulatory Visit: Payer: Self-pay

## 2022-04-24 ENCOUNTER — Ambulatory Visit: Payer: Medicare PPO

## 2022-04-24 VITALS — BP 138/70 | HR 73 | Temp 98.3°F

## 2022-04-24 DIAGNOSIS — E7800 Pure hypercholesterolemia, unspecified: Secondary | ICD-10-CM

## 2022-04-24 MED ORDER — FLUOCINOLONE ACETONIDE SCALP 0.01 % EX OIL: TOPICAL_OIL | CUTANEOUS | 1 refills | Status: DC

## 2022-04-24 NOTE — Progress Notes (Signed)
Patient presents today for BPC. She is currently taking felodipine '10mg'$ .   BP Readings from Last 3 Encounters:  04/24/22 138/70  04/17/22 (!) 158/74  03/14/22 (!) 148/64   Patient will follow up with provider as scheduled.

## 2022-04-25 LAB — LIPID PANEL
Chol/HDL Ratio: 2.9 ratio (ref 0.0–4.4)
Cholesterol, Total: 176 mg/dL (ref 100–199)
HDL: 61 mg/dL (ref >39)
LDL Chol Calc (NIH): 96 mg/dL (ref 0–99)
Triglycerides: 103 mg/dL (ref 0–149)
VLDL Cholesterol Cal: 19 mg/dL (ref 5–40)

## 2022-05-01 ENCOUNTER — Telehealth: Payer: Self-pay | Admitting: Internal Medicine

## 2022-05-02 DIAGNOSIS — C50911 Malignant neoplasm of unspecified site of right female breast: Secondary | ICD-10-CM | POA: Diagnosis not present

## 2022-05-02 NOTE — Telephone Encounter (Signed)
Called and spoke with pt letting her know that we needed her to have an OV prior to [place] being able to send order for cpap to new DME and she verbalized understanding. Appt scheduled for pt. Nothing further needed.

## 2022-05-03 DIAGNOSIS — N182 Chronic kidney disease, stage 2 (mild): Secondary | ICD-10-CM

## 2022-05-03 DIAGNOSIS — E1122 Type 2 diabetes mellitus with diabetic chronic kidney disease: Secondary | ICD-10-CM

## 2022-05-03 DIAGNOSIS — I10 Essential (primary) hypertension: Secondary | ICD-10-CM

## 2022-05-08 DIAGNOSIS — H35033 Hypertensive retinopathy, bilateral: Secondary | ICD-10-CM | POA: Diagnosis not present

## 2022-05-08 DIAGNOSIS — E119 Type 2 diabetes mellitus without complications: Secondary | ICD-10-CM | POA: Diagnosis not present

## 2022-05-08 DIAGNOSIS — H43813 Vitreous degeneration, bilateral: Secondary | ICD-10-CM | POA: Diagnosis not present

## 2022-05-08 DIAGNOSIS — H16223 Keratoconjunctivitis sicca, not specified as Sjogren's, bilateral: Secondary | ICD-10-CM | POA: Diagnosis not present

## 2022-05-08 DIAGNOSIS — H2513 Age-related nuclear cataract, bilateral: Secondary | ICD-10-CM | POA: Diagnosis not present

## 2022-05-09 NOTE — Progress Notes (Signed)
Subjective:    Patient ID: Mariah Burns, female    DOB: 1945/11/12, 76 y.o.   MRN: [id number]  HPI female never smoker followed for OSA, Sarcoid, complicated by HBP, DM NPSG 03/08/2010  AHI 19.8/hr  Office spirometry 09/20/15- WNL-FEV1/FVC 0.85 HST 12/24/19- AHI 12.1/ hr, desaturation to 88%, bodyweight 150 lbs ------------------------------------------------------------------------------------------   03/30/21- 76 year old female never smoker followed for OSA, Sarcoid, complicated by HBP, DM2, Cancer R breast XRT, CPAP auto 4-10/ [facility] Download-compliance 90%, AHI 1.6/ hr Body weight today-149 lbs Covid vax- 3 Phizer Flu vax-today -----Patient is not sleeping all night wakes up about 3 or 4. Wearing CPAP machine.  Doing well with CPAP- download reviewed. Waking for bathroom 3-4x/ night, then difficult getting back to sleep. Sleeps with TV on, sound down. May play games on phone. Discussed screen exposure, sleep hygiene. Melatonin gave weird dreams Breathing ok- no issues.  05/10/22-  76 year old female never smoker followed for OSA, Sarcoid, complicated by HBP, dCHF, DM2, Cancer R breast XRT, CPAP auto 4-10/ [facility] Download-compliance 93%, AHI 2.8/ hr Body weight today- Covid vax- 3 Phizer Flu vax-had Download reviewed. Eyes get dry- discussed mask fit and humidifier. May be partly medication effect. No rash or cough. Sarcoid thought to be burned out. CXR 07/07/21- FINDINGS: Heart size and pulmonary vascularity are normal. Lungs are clear. No pleural effusions. No pneumothorax. 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 This is unchanged since prior study. Degenerative changes in the spine. IMPRESSION: No evidence of active pulmonary disease. No change since previous study.   Review of Systems-see HPI  + = positive Constitutional:   No-   weight loss, night sweats, fevers, chills,  +fatigue, lassitude. HEENT:   No-  headaches, difficulty swallowing, tooth/dental problems, sore throat,       No-  sneezing, itching, ear ache, nasal congestion, post nasal drip,  CV:  No-   chest pain, orthopnea, PND, swelling in lower extremities, anasarca, dizziness, palpitations Resp: +shortness of breath with exertion or at rest.              No-   productive cough,  No non-productive cough,  No- coughing up of blood.              No-   change in color of mucus.  No- wheezing.   Skin: No-   rash or lesions. GI:  No-   heartburn, indigestion, abdominal pain, nausea, vomiting,  GU:  MS:  No-   joint pain or swelling.   Neuro-     nothing unusual Psych:  No- change in mood or affect. No depression or anxiety.  No memory loss.  Objective:   Physical Exam    General- Alert, Oriented, Affect-appropriate, Distress- none acute; +medium build Skin- rash-none, lesions- none, excoriation- none Lymphadenopathy- none Head- atraumatic            Eyes- Gross vision intact, PERRLA, conjunctivae clear secretions            Ears- Hearing, canals-normal            Nose- Clear, no-Septal dev, mucus, polyps, erosion, perforation             Throat- Mallampati III , mucosa- not very dry , drainage- none, tonsils- atrophic Neck- flexible , trachea midline, no stridor , thyroid nl, carotid no bruit Chest - symmetrical excursion , unlabored  Heart/CV- RRR ,  ATFTDD+2-2/0 systolic AS , no gallop  , no rub, nl s1 s2                           - JVD- none , edema- none, stasis changes- none, varices- none           Lung- clear to P&A, wheeze- none, cough- none , dullness-none, rub- none           Chest wall-  Abd-  Br/ Gen/ Rectal- Not done, not indicated Extrem- cyanosis- none, clubbing, none, atrophy- none, strength- nl Neuro- grossly intact to observation

## 2022-05-10 ENCOUNTER — Ambulatory Visit: Payer: Medicare PPO | Admitting: Internal Medicine

## 2022-05-10 ENCOUNTER — Encounter: Payer: Self-pay | Admitting: Internal Medicine

## 2022-05-10 VITALS — BP 130/64 | HR 74 | Ht 59.0 in | Wt 145.6 lb

## 2022-05-10 DIAGNOSIS — D869 Sarcoidosis, unspecified: Secondary | ICD-10-CM

## 2022-05-10 DIAGNOSIS — G4733 Obstructive sleep apnea (adult) (pediatric): Secondary | ICD-10-CM

## 2022-05-10 NOTE — Patient Instructions (Addendum)
Order- [facility]- Pt needs to change DME from [facility] for CPAP. Continue auto 4-10. Please refit mask to reduce air in face. Continue humidifier, supplies, airView/ card  Please call if we can help

## 2022-05-21 ENCOUNTER — Telehealth: Payer: Self-pay

## 2022-05-21 NOTE — Progress Notes (Signed)
  Mariah Burns was reminded to have all medications, supplements and any blood glucose and blood pressure readings available for review with [name], Pharm. D, at her telephone visit on 05-23-2022 at 9:00.   Questions: Have you had any recent office visit or specialist visit outside of [place]? Patient stated no.  Are there any concerns you would like to discuss during your office visit? Patient stated she is having dizziness from the atorvastatin. she took it one time and stopped it.   Are you having any problems obtaining your medications? (Whether it pharmacy issues or cost) Patient stated no  If patient has any PAP medications ask if they are having any problems getting their PAP medication or refill? No PAP medications   Care Gaps: Covid booster overdue  Star Rating Drug: Atorvastatin 40 mg- Last filled 04-22-2022 [facility] Previous [facility]-16-[facility]22 [facility]   Any gaps in medications fill history? No  [place] Pharmacist Assistant [phone]

## 2022-05-23 ENCOUNTER — Ambulatory Visit (INDEPENDENT_AMBULATORY_CARE_PROVIDER_SITE_OTHER): Payer: Medicare PPO

## 2022-05-23 DIAGNOSIS — I10 Essential (primary) hypertension: Secondary | ICD-10-CM

## 2022-05-23 DIAGNOSIS — E7800 Pure hypercholesterolemia, unspecified: Secondary | ICD-10-CM

## 2022-05-23 DIAGNOSIS — E1122 Type 2 diabetes mellitus with diabetic chronic kidney disease: Secondary | ICD-10-CM

## 2022-05-23 NOTE — Patient Instructions (Addendum)
Visit Information It was great speaking with you today!  Please let me know if you have any questions about our visit.   Goals Addressed             This Visit's Progress    Manage My Medicine       Timeframe:  Long-Range Goal Priority:  High Start Date:                             Expected End Date:                       Follow Up Date `09/19/2022   In Progress:  - call for medicine refill 2 or 3 days before it runs out - call if I am sick and can't take my medicine - keep a list of all the medicines I take; vitamins and herbals too - use a pillbox to sort medicine - use an alarm clock or phone to remind me to take my medicine    Why is this important?   These steps will help you keep on track with your medicines.   Notes:  Please call with any questions.         Patient Care Plan: CCM Pharmacy Care Plan     Problem Identified: HTN, DM, HLD   Priority: High     Goal: Patient Stated   Recent Progress: On track  Priority: High  Note:   Current Barriers:  Unable to independently monitor therapeutic efficacy  Pharmacist Clinical Goal(s):  Patient will achieve adherence to monitoring guidelines and medication adherence to achieve therapeutic efficacy through collaboration with PharmD and provider.   Interventions: 1:1 collaboration with Glendale Chard, MD regarding development and update of comprehensive plan of care as evidenced by provider attestation and co-signature Inter-disciplinary care team collaboration (see longitudinal plan of care) Comprehensive medication review performed; medication list updated in electronic medical record  Hypertension (BP goal <140/90) -Controlled -Current treatment: Felodipine 10 mg tablet once per day Appropriate, Effective, Safe, Accessible -Current home readings: she is not currently checking her BP at home because the cuff is over three years old.  -Current dietary habits: she is not using a lot of salt in her food.   -Current exercise habits: please see hyperlipidmia -Denies hypotensive/hypertensive symptoms -Educated on Daily salt intake goal < 2300 mg; Importance of home blood pressure monitoring; Proper BP monitoring technique; -Counseled to monitor BP at home at least three times per week, document, and provide log at future appointments -Recommended to continue current medication  Diabetes (A1c goal <8%) -Controlled -Current medications: Not currently taking any medication for -Current home glucose readings: she is not currently checking her BS at home  -Denies hypoglycemic/hyperglycemic symptoms -Current meal patterns:  dinner: 15 bean soup  -Current exercise: she is working on putting things away and getting things organized. She reports being active doing this for at least an hour per day.  -Educated on Complications of diabetes including kidney damage, retinal damage, and cardiovascular disease; -Counseled to check feet daily and get yearly eye exams -Counseled on diet and exercise extensively   Hyperlipidemia: (LDL goal < 70) -Uncontrolled -Current treatment: Atorvastatin 40 mg tablet Monday through Friday Appropriate, Query effective -Current dietary patterns: she is limiting her fried foods, she does not fry any of her foods -Drinks: she is drinking a lot of water  -Current exercise: she is working on putting things away and getting  things organized. She reports being active doing this for at least an hour per day.  -Educated on Cholesterol goals;  Benefits of statin for ASCVD risk reduction; Importance of limiting foods high in cholesterol; -We discussed Ms. Thompsons concern about her medication and the side effects it has been causing including nausea.  -She reports that she is open to taking Pravastatin 80 mg tablet daily in addition to another medication if necessary Collaborated with PCP to change patient back to Pravastatin 80 mg tablet once per day and add Zetia 10 mg  tablet once per day as well based on patients current LDL and her past success with the other medication.    Patient Goals/Self-Care Activities Patient will:  - take medications as prescribed as evidenced by patient report and record review  Follow Up Plan: The patient has been provided with contact information for the care management team and has been advised to call with any health related questions or concerns.      Patient agreed to services and verbal consent obtained.   The patient verbalized understanding of instructions, educational materials, and care plan provided today and agreed to receive a mailed copy of patient instructions, educational materials, and care plan.   [name], PharmD Clinical Pharmacist [facility] [phone]

## 2022-05-23 NOTE — Progress Notes (Signed)
Chronic Care Management Pharmacy Note  05/23/2022 Name:  Mariah Burns MRN:  [id number] DOB:  [birth year]  Summary: Patient reports that she is trying to adjust to her smaller home. She is worried about taking Atorvastatin because of the side effects she feels like it is causing like nausea. She has not been taking it and would prefer to take Pravastatin again.   Recommendations/Changes made from today's visit: Recommend Ms. Elizondo go back to [facility]  Recommend Ms. Grandville Silos get her COVID booster   Plan: Collaborate with PCP to change [name] back to pravastatin 80 mg tablet with Zetia 10 mg tablet  [name] is going to go to [facility] to fellowship once per week and exercise in the pool.  She is going to get the COVID booster and get a new BP cuff from the pharmacy  Collaborate with PCP to change [name] back to Pravastatin 80 mg tablet daily, and add Zetia 10 mg tablet daily    Subjective: Mariah Burns is an 76 y.o. year old female who is a primary patient of Glendale Chard, MD.  The CCM team was consulted for assistance with disease management and care coordination needs.    Engaged with patient by telephone for follow up visit in response to provider referral for pharmacy case management and/or care coordination services.   Consent to Services:  The patient was given information about Chronic Care Management services, agreed to services, and gave verbal consent prior to initiation of services.  Please see initial visit note for detailed documentation.   Patient Care Team: Glendale Chard, MD as PCP - General (Internal Medicine) Belva Crome, MD as PCP - Cardiology (Cardiology) Mauro Kaufmann, RN as Oncology Nurse Navigator Rockwell Germany, RN as Oncology Nurse Navigator Excell Seltzer, MD (Inactive) as Consulting Physician (General Surgery) Truitt Merle, MD as Consulting Physician (Hematology) Gery Pray, MD as Consulting Physician  (Radiation Oncology) Alla Feeling, NP as Nurse Practitioner (Nurse Practitioner) Warren Danes, PA-C as Physician Assistant (Dermatology) Mayford Knife, [facility] (Pharmacist) Marylynn Miciah Covelli, MD as Consulting Physician (Ophthalmology)  Recent office visits: 03/13/2022 PCP OV  Recent consult visits: 05/10/2022 Pulmonology OV 03/14/2022 Rheumatology OV  03/07/2022 Cardiology OV 02/28/2022 Oncology [facility] visits: None in previous 6 months   Objective:  Lab Results  Component Value Date   CREATININE 0.92 02/28/2022   BUN 14 02/28/2022   EGFR 76 01/31/2022   GFRNONAA >60 02/28/2022   GFRAA 85 05/26/2020   NA 141 02/28/2022   K 3.8 02/28/2022   CALCIUM 9.6 02/28/2022   CO2 32 02/28/2022   GLUCOSE 115 (H) 02/28/2022    Lab Results  Component Value Date/Time   HGBA1C 5.9 (H) 03/13/2022 02:02 PM   HGBA1C 6.0 (H) 01/31/2022 10:51 AM   MICROALBUR 30 02/23/2021 05:06 PM   MICROALBUR 30 02/23/2020 01:26 PM    Last diabetic Eye exam:  Lab Results  Component Value Date/Time   HMDIABEYEEXA No Retinopathy 10/31/2021 12:00 AM    Last diabetic Foot exam: No results found for: "HMDIABFOOTEX"   Lab Results  Component Value Date   CHOL 176 04/24/2022   HDL 61 04/24/2022   LDLCALC 96 04/24/2022   TRIG 103 04/24/2022   CHOLHDL 2.9 04/24/2022       Latest Ref Rng & Units 02/28/2022   12:14 PM 01/31/2022   10:51 AM 08/23/2021   10:14 AM  Hepatic Function  Total Protein 6.5 - 8.1 g/dL 7.9  7.6  7.6   Albumin 3.5 - 5.0 g/dL 4.3  4.5  4.2   AST 15 - 41 U/L _0 ALT 0 - 44 U/L _1 Alk Phosphatase 38 - 126 U/L 65  76  63   Total Bilirubin 0.3 - 1.2 mg/dL 0.4  0.5  0.5     Lab Results  Component Value Date/Time   TSH 1.170 11/02/2020 10:36 AM   TSH 1.240 06/11/2019 02:24 PM       Latest Ref Rng & Units 02/28/2022   12:14 PM 01/31/2022   10:51 AM 08/23/2021   10:14 AM  CBC  WBC 4.0 - 10.5 K/uL 4.1  4.2  3.5   Hemoglobin 12.0 - 15.0 g/dL 11.9   11.8  11.6   Hematocrit 36.0 - 46.0 % 36.4  36.9  35.5   Platelets 150 - 400 K/uL 190  189  177     Lab Results  Component Value Date/Time   VD25OH 43.0 10/28/2019 11:37 AM   VITAMINB12 >2000 (H) 02/23/2021 04:11 PM   VITAMINB12 >2000 (H) 11/02/2020 10:36 AM    Clinical ASCVD: No  The ASCVD Risk score (Arnett DK, et al., 2019) failed to calculate for the following reasons:   Unable to determine if patient is Non-Hispanic African American       01/31/2022   10:08 AM 12/06/2021    9:48 AM 10/05/2021   11:58 AM  Depression screen PHQ 2/9  Decreased Interest 0 0 0  Down, Depressed, Hopeless 0 0 0  PHQ - 2 Score 0 0 0  Altered sleeping 0  0  Tired, decreased energy 0  0  Change in appetite 0  0  Feeling bad or failure about yourself  0  0  Trouble concentrating 0  0  Moving slowly or fidgety/restless 0  0  Suicidal thoughts 0  0  PHQ-9 Score 0  0  Difficult doing work/chores Not difficult at all       Social History   Tobacco Use  Smoking Status Never   Passive exposure: Never  Smokeless Tobacco Never   BP Readings from Last 3 Encounters:  05/10/22 130/64  04/24/22 138/70  04/17/22 (!) 158/74   Pulse Readings from Last 3 Encounters:  05/10/22 74  04/24/22 73  04/17/22 (!) 59   Wt Readings from Last 3 Encounters:  05/10/22 145 lb 9.6 oz (66 kg)  04/17/22 144 lb (65.3 kg)  03/14/22 145 lb 3.2 oz (65.9 kg)   BMI Readings from Last 3 Encounters:  05/10/22 29.41 kg/m  04/17/22 29.08 kg/m  03/14/22 29.33 kg/m    Assessment/Interventions: Review of patient past medical history, allergies, medications, health status, including review of consultants reports, laboratory and other test data, was performed as part of comprehensive evaluation and provision of chronic care management services.   SDOH:  (Social Determinants of Health) assessments and interventions performed: No SDOH Interventions    Flowsheet Row Office Visit from 06/06/2021 in [place] Management from 01/03/2021 in [place] Visit from 11/02/2020 in [place] from 10/28/2019 in [place] from 10/23/2018 in [place] from 05/15/2018 in [facility]  SDOH Interventions        Food Insecurity Interventions -- Intervention Not Indicated -- -- -- --  Housing Interventions -- Intervention Not Indicated -- -- -- --  Transportation Interventions -- Intervention Not Indicated -- -- -- --  Depression Interventions/Treatment  Patient refuses Treatment -- Patient refuses Treatment PHQ2-9 Score <4 Follow-up Not Indicated PHQ2-9 Score <4 Follow-up Not Indicated Counseling, Medication  [She will c/w therapy as per SEL group. She admits that she is overwhelmed w/ the care of her Mother's estate, the care of her brother, and she has had two losses in her family. She is agreeable to possibly starting meds]      [place]: No Food Insecurity (12/06/2021)  Housing: Low Risk  (01/03/2021)  Transportation Needs: No Transportation Needs (12/06/2021)  Depression (PHQ2-9): Low Risk  (01/31/2022)  Financial Resource Strain: Low Risk  (12/06/2021)  Physical Activity: Sufficiently Active (12/06/2021)  Stress: No Stress Concern Present (12/06/2021)  Tobacco Use: Low Risk  (05/10/2022)    CCM Care Plan  Allergies  Allergen Reactions   Fosamax [Alendronate Sodium] Other (See Comments)    REACTION: "CAUSE GI UPSET AND FATIGUE"   Omeprazole Other (See Comments)    Lower ab pain   Motrin [Ibuprofen] Other (See Comments)    GI upset- has to eat prior to taking   Fluvastatin Other (See Comments)    mild memoryproblems of confusion     Medications Reviewed Today     Reviewed by Mayford Knife, [place] (Pharmacist) on 05/23/22 at [place] List Status: <None>   Medication Order Taking? Sig  Documenting Provider Last Dose Status Informant  0.9 %  sodium chloride infusion [id number]   Minette Brine, FNP  Active   aspirin EC 81 MG tablet [id number] No Take 81 mg by mouth daily. Swallow whole. [provider] Taking Active   atorvastatin (LIPITOR) 40 MG tablet [id number] No Take one tablet by mouth on Monday-Friday, skip weekends. Glendale Chard, MD Taking Active   CALCIUM PO [id number] No Take by mouth daily. [provider] Taking Active   Cholecalciferol (VITAMIN D-3 PO) [id number] No Take 1,000 Units by mouth daily. [provider] Taking Active   Cyanocobalamin (VITAMIN B-12 PO) [id number] No  [provider] Taking Active   famotidine (PEPCID) 40 MG tablet [id number] No as needed. [provider] Taking Active   felodipine (PLENDIL) 10 MG 24 hr tablet [id number] No TAKE 1 TABLET BY MOUTH EVERY DAY Glendale Chard, MD Taking Active   fish oil-omega-3 fatty acids 1000 MG capsule [id number] No Take 2 capsules by mouth daily. [provider] Taking Active   Fluocinolone Acetonide Scalp 0.01 % OIL [id number] No Apply to affected area of scalp as directed 1-2 x/day as needed for itching Glendale Chard, MD Taking Active   GLUCOSAMINE-CHONDROITIN PO [id number] No Take 2 tablets by mouth daily. [provider] Taking Active   L-Methylfolate-Algae-B12-B6 Glade Stanford) 3-90.314-2-35 MG CAPS [id number] No Take 1 capsule by mouth 2 (two) times daily.  [provider] Taking Active            Med Note Tamsen Snider   Tue Aug 19, 2018  9:17 AM)    Magnesium 250 MG TABS [id number] No Take by mouth daily. Tues, Thursday, Saturday [provider] Taking Active   Menthol, Topical Analgesic, (BIOFREEZE ROLL-ON EX) [id number] No Apply 1 application. topically as needed (pain in back,neck,thumb). [provider] Taking Active   nitroGLYCERIN (NITROSTAT) 0.4 MG SL tablet [id number] No Place 1 tablet (0.4 mg total) under the tongue  every 5 (five) minutes as needed for chest pain.  Patient not taking: Reported on 05/10/2022   Dorene Ar,  Otilio Carpen, NP Not Taking Active   Polyvinyl Alcohol-Povidone PF 1.4-0.6 % SOLN [id number] No Place 1 drop into both eyes daily as needed (dry eyes).  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[place]) 10/28/2019   Class 1 obesity due to excess calories with serious comorbidity and body mass index (BMI) of 30.0 to 30.9 in adult 10/28/2019   Vitamin D deficiency disease 10/28/2019   Chronic bilateral low back pain without sciatica 10/28/2019   Hip pain, bilateral 10/28/2019   Malignant neoplasm of upper-outer quadrant of right breast in female, estrogen receptor positive ([place]) 08/11/2018   Chronic renal disease, stage II 05/15/2018   Hypertensive nephropathy 05/15/2018   Adult BMI 29.0-29.9 kg/sq m 05/15/2018   Dyspnea on exertion 02/17/2015   Incomplete right bundle branch block 04/06/1593   Systolic murmur 58/59/2924   Pericardial effusion 02/17/2015   Osteopenia 10/05/2011   Type 2 diabetes mellitus with stage 2 chronic kidney disease, without long-term current use of insulin ([place]) 05/06/2010   SARCOIDOSIS 03/21/2007   Hyperlipidemia with target LDL less than 70 03/21/2007   Obstructive sleep apnea 03/21/2007   Essential hypertension 03/21/2007    Immunization History  Administered Date(s) Administered   Fluad Quad(high Dose 65+) 02/12/2019, 02/23/2020, 03/30/2021, 03/13/2022    Influenza Split 04/10/2011, 04/15/2012, 03/04/2013, 03/04/2014, 03/05/2015, 03/04/2017   Influenza Whole 06/13/2010   Influenza,inj,Quad PF,6+ Mos 03/22/2016   Influenza-Unspecified 03/22/2016, 03/04/2018   PFIZER(Purple Top)SARS-COV-2 Vaccination 08/09/2019, 09/08/2019, 05/18/2020   Pneumococcal Conjugate-13 11/13/2017   Pneumococcal Polysaccharide-23 06/04/2009, 12/28/2010, 06/11/2019   Td 05/31/2008   Tdap 12/13/2018   Unspecified SARS-COV-2 Vaccination 09/08/2019   Zoster Recombinat (Shingrix) 10/05/2021, 01/31/2022    Conditions to be addressed/monitored:  Hypertension and Diabetes  Care Plan : [place]  Updates made by Mayford Knife, [place] since 05/23/2022 12:00 AM     Problem: HTN, DM, HLD   Priority: High     Goal: Patient Stated   Recent Progress: On track  Priority: High  Note:   Current Barriers:  Unable to independently monitor therapeutic efficacy  Pharmacist Clinical Goal(s):  Patient will achieve adherence to monitoring guidelines and medication adherence to achieve therapeutic efficacy through collaboration with PharmD and provider.   Interventions: 1:1 collaboration with Glendale Chard, MD regarding development and update of comprehensive plan of care as evidenced by provider attestation and co-signature Inter-disciplinary care team collaboration (see longitudinal plan of care) Comprehensive medication review performed; medication list updated in electronic medical record  Hypertension (BP goal <140/90) -Controlled -Current treatment: Felodipine 10 mg tablet once per day Appropriate, Effective, Safe, Accessible -Current home readings: she is not currently checking her BP at home because the cuff is over three years old.  -Current dietary habits: she is not using a lot of salt in her food.  -Current exercise habits: please see hyperlipidmia -Denies hypotensive/hypertensive symptoms -Educated on Daily salt intake goal < 2300  mg; Importance of home blood pressure  monitoring; Proper BP monitoring technique; -Counseled to monitor BP at home at least three times per week, document, and provide log at future appointments -Recommended to continue current medication  Diabetes (A1c goal <8%) -Controlled -Current medications: Not currently taking any medication for -Current home glucose readings: she is not currently checking her BS at home  -Denies hypoglycemic/hyperglycemic symptoms -Current meal patterns:  dinner: 15 bean soup  -Current exercise: she is working on putting things away and getting things organized. She reports being active doing this for at least an hour per day.  -Educated on Complications of diabetes including kidney damage, retinal damage, and cardiovascular disease; -Counseled to check feet daily and get yearly eye exams -Counseled on diet and exercise extensively   Hyperlipidemia: (LDL goal < 70) -Uncontrolled -Current treatment: Atorvastatin 40 mg tablet Monday through Friday Appropriate, Query effective -Current dietary patterns: she is limiting her fried foods, she does not fry any of her foods -Drinks: she is drinking a lot of water  -Current exercise: she is working on putting things away and getting things organized. She reports being active doing this for at least an hour per day.  -Educated on Cholesterol goals;  Benefits of statin for ASCVD risk reduction; Importance of limiting foods high in cholesterol; -We discussed Ms. Thompsons concern about her medication and the side effects it has been causing including nausea.  -She reports that she is open to taking Pravastatin 80 mg tablet daily in addition to another medication if necessary Collaborated with PCP to change patient back to Pravastatin 80 mg tablet once per day and add Zetia 10 mg tablet once per day as well based on patients current LDL and her past success with the other medication.    Patient Goals/Self-Care  Activities Patient will:  - take medications as prescribed as evidenced by patient report and record review  Follow Up Plan: The patient has been provided with contact information for the care management team and has been advised to call with any health related questions or concerns.       Medication Assistance: None required.  Patient affirms current coverage meets needs.  Compliance/Adherence/Medication fill history: Care Gaps: COVID-19 Vaccine   Star-Rating Drugs: Atorvastatin 40 mg tablet   Patient's preferred pharmacy is:  [id number]- [place],  - [address]Phone: [phone]: [phone] Uses pill box? Yes Pt endorses 90% compliance  We discussed: Benefits of medication synchronization, packaging and delivery as well as enhanced pharmacist oversight with [employer]. Patient decided to: Continue current medication management strategy  Care Plan and Follow Up Patient Decision:  Patient agrees to Care Plan and Follow-up.  Plan: The patient has been provided with contact information for the care management team and has been advised to call with any health related questions or concerns.   VOrlando Penner CPP, PharmD Clinical Pharmacist Practitioner [facility] [phone]

## 2022-05-24 ENCOUNTER — Telehealth: Payer: Self-pay | Admitting: Internal Medicine

## 2022-05-24 DIAGNOSIS — G4733 Obstructive sleep apnea (adult) (pediatric): Secondary | ICD-10-CM

## 2022-05-24 NOTE — Telephone Encounter (Signed)
Called Brad back from [employer] and he states that they are needing an order for cpap supplies for this patient. Looked at old orders and patient was just seen in office this month so new order for supplies for cpap placed. Will go to MR to sign since Dr Annamaria Boots is out of office till 05/29/2022. Nothing further needed

## 2022-06-01 DIAGNOSIS — M722 Plantar fascial fibromatosis: Secondary | ICD-10-CM | POA: Diagnosis not present

## 2022-06-01 DIAGNOSIS — G5762 Lesion of plantar nerve, left lower limb: Secondary | ICD-10-CM | POA: Diagnosis not present

## 2022-06-01 DIAGNOSIS — B351 Tinea unguium: Secondary | ICD-10-CM | POA: Diagnosis not present

## 2022-06-01 DIAGNOSIS — M792 Neuralgia and neuritis, unspecified: Secondary | ICD-10-CM | POA: Diagnosis not present

## 2022-06-01 DIAGNOSIS — M19079 Primary osteoarthritis, unspecified ankle and foot: Secondary | ICD-10-CM | POA: Diagnosis not present

## 2022-06-01 DIAGNOSIS — E1142 Type 2 diabetes mellitus with diabetic polyneuropathy: Secondary | ICD-10-CM | POA: Diagnosis not present

## 2022-06-01 DIAGNOSIS — L603 Nail dystrophy: Secondary | ICD-10-CM | POA: Diagnosis not present

## 2022-06-01 DIAGNOSIS — I739 Peripheral vascular disease, unspecified: Secondary | ICD-10-CM | POA: Diagnosis not present

## 2022-06-03 DIAGNOSIS — E1159 Type 2 diabetes mellitus with other circulatory complications: Secondary | ICD-10-CM | POA: Diagnosis not present

## 2022-06-03 DIAGNOSIS — E785 Hyperlipidemia, unspecified: Secondary | ICD-10-CM

## 2022-06-03 DIAGNOSIS — I10 Essential (primary) hypertension: Secondary | ICD-10-CM

## 2022-06-06 DIAGNOSIS — G4733 Obstructive sleep apnea (adult) (pediatric): Secondary | ICD-10-CM | POA: Diagnosis not present

## 2022-06-11 ENCOUNTER — Encounter: Payer: Self-pay | Admitting: Internal Medicine

## 2022-06-11 NOTE — Assessment & Plan Note (Signed)
Benefits from CPAP with good compliance and control Plan- Changing from [facility] to new DME. Refit mask but continue auto 4-10

## 2022-06-11 NOTE — Assessment & Plan Note (Signed)
Clinically in long-term remission

## 2022-06-13 ENCOUNTER — Ambulatory Visit (HOSPITAL_BASED_OUTPATIENT_CLINIC_OR_DEPARTMENT_OTHER): Payer: Medicare PPO | Attending: Internal Medicine | Admitting: Radiology

## 2022-06-13 DIAGNOSIS — G4733 Obstructive sleep apnea (adult) (pediatric): Secondary | ICD-10-CM

## 2022-07-24 ENCOUNTER — Encounter (HOSPITAL_COMMUNITY): Payer: Self-pay

## 2022-07-24 ENCOUNTER — Emergency Department (HOSPITAL_COMMUNITY)
Admission: EM | Admit: 2022-07-24 | Discharge: 2022-07-24 | Disposition: A | Discharge status: Home / Self Care | Payer: Medicare PPO | Attending: Emergency Medicine | Admitting: Emergency Medicine

## 2022-07-24 ENCOUNTER — Emergency Department (HOSPITAL_COMMUNITY): Payer: Medicare PPO

## 2022-07-24 ENCOUNTER — Other Ambulatory Visit: Payer: Self-pay

## 2022-07-24 DIAGNOSIS — R0789 Other chest pain: Secondary | ICD-10-CM | POA: Diagnosis not present

## 2022-07-24 DIAGNOSIS — E876 Hypokalemia: Secondary | ICD-10-CM | POA: Insufficient documentation

## 2022-07-24 DIAGNOSIS — R079 Chest pain, unspecified: Secondary | ICD-10-CM

## 2022-07-24 DIAGNOSIS — I5030 Unspecified diastolic (congestive) heart failure: Secondary | ICD-10-CM | POA: Diagnosis not present

## 2022-07-24 DIAGNOSIS — Z7982 Long term (current) use of aspirin: Secondary | ICD-10-CM | POA: Insufficient documentation

## 2022-07-24 DIAGNOSIS — I110 Hypertensive heart disease with heart failure: Secondary | ICD-10-CM | POA: Diagnosis not present

## 2022-07-24 DIAGNOSIS — E119 Type 2 diabetes mellitus without complications: Secondary | ICD-10-CM | POA: Diagnosis not present

## 2022-07-24 LAB — BASIC METABOLIC PANEL
Anion gap: 10 (ref 5–15)
BUN: 11 mg/dL (ref 8–23)
CO2: 25 mmol/L (ref 22–32)
Calcium: 9.5 mg/dL (ref 8.9–10.3)
Chloride: 103 mmol/L (ref 98–111)
Creatinine, Ser: 0.8 mg/dL (ref 0.44–1.00)
GFR, Estimated: >60 mL/min (ref >60)
Glucose, Bld: 113 mg/dL — ABNORMAL HIGH (ref 70–99)
Potassium: 3.3 mmol/L — ABNORMAL LOW (ref 3.5–5.1)
Sodium: 138 mmol/L (ref 135–145)

## 2022-07-24 LAB — CBC
HCT: 39.4 % (ref 36.0–46.0)
Hemoglobin: 12.6 g/dL (ref 12.0–15.0)
MCH: 28.7 pg (ref 26.0–34.0)
MCHC: 32 g/dL (ref 30.0–36.0)
MCV: 89.7 fL (ref 80.0–100.0)
Platelets: 172 10*3/uL (ref 150–400)
RBC: 4.39 MIL/uL (ref 3.87–5.11)
RDW: 14.3 % (ref 11.5–15.5)
WBC: 4.1 10*3/uL (ref 4.0–10.5)
nRBC: 0 % (ref 0.0–0.2)

## 2022-07-24 LAB — TROPONIN I (HIGH SENSITIVITY)
Troponin I (High Sensitivity): 6 ng/L (ref <18)
Troponin I (High Sensitivity): 7 ng/L (ref <18)

## 2022-07-24 MED ORDER — ASPIRIN 81 MG PO CHEW
162.0000 mg | CHEWABLE_TABLET | Freq: Once | ORAL | Status: AC
  Administered 2022-07-24: 162 mg via ORAL
  Filled 2022-07-24: qty 2

## 2022-07-24 NOTE — ED Provider Notes (Signed)
[facility] Provider Note   CSN: [id number] Arrival date & time: 07/24/22  1316     History  Chief Complaint  Patient presents with   Chest Pain    Mariah Burns is a 77 y.o. female.  The history is provided by the patient and a relative.  Chest Pain    Patient with medical history of sarcoidosis, hypertension, hyperlipidemia, OSA, diabetes, diastolic CHF, pulmonary fibrosis presents to the emergency department. Due to chest pain.  Started about 1300 while she was on the way to do chair yoga with a family member.  It felt like chest tightness across her chest and radiated to her jaw.  It lasted for 5 minutes total and resolved independent of any intervention.  She denies any shortness of breath, nausea or vomiting, lower extremity weakness.  Took baby aspirin prior to arrival.  No viral symptoms or fatigue this week. Patient has been having elevated BP over the last few months despite medical compliance.   Home Medications Prior to Admission medications   Medication Sig Start Date End Date Taking? Authorizing Provider  aspirin EC 81 MG tablet Take 81 mg by mouth daily. Swallow whole.    [provider]  atorvastatin (LIPITOR) 40 MG tablet Take one tablet by mouth on Monday-Friday, skip weekends. 03/16/22   Glendale Chard, MD  CALCIUM PO Take by mouth daily.    [provider]  Cholecalciferol (VITAMIN D-3 PO) Take 1,000 Units by mouth daily.    [provider]  Cyanocobalamin (VITAMIN B-12 PO)     [provider]  famotidine (PEPCID) 40 MG tablet as needed. 11/16/19   [provider]  felodipine (PLENDIL) 10 MG 24 hr tablet TAKE 1 TABLET BY MOUTH EVERY DAY 12/13/21   Glendale Chard, MD  fish oil-omega-3 fatty acids 1000 MG capsule Take 2 capsules by mouth daily.    [provider]  Fluocinolone Acetonide Scalp 0.01 % OIL Apply to affected area of scalp as directed 1-2 x/day as  needed for itching 04/24/22   Glendale Chard, MD  GLUCOSAMINE-CHONDROITIN PO Take 2 tablets by mouth daily.    [provider]  L-Methylfolate-Algae-B12-B6 Glade Stanford) 3-90.314-2-35 MG CAPS Take 1 capsule by mouth 2 (two) times daily.     [provider]  Magnesium 250 MG TABS Take by mouth daily. Tues, Thursday, Saturday    [provider]  Menthol, Topical Analgesic, (BIOFREEZE ROLL-ON EX) Apply 1 application. topically as needed (pain in back,neck,thumb).    [provider]  nitroGLYCERIN (NITROSTAT) 0.4 MG SL tablet Place 1 tablet (0.4 mg total) under the tongue every 5 (five) minutes as needed for chest pain. Patient not taking: Reported on 05/10/2022 06/06/16   Isaiah Serge, NP  Polyvinyl Alcohol-Povidone PF 1.4-0.6 % SOLN Place 1 drop into both eyes daily as needed (dry eyes).     [provider]  TART CHERRY PO     [provider]      Allergies    Fosamax [alendronate sodium], Omeprazole, Motrin [ibuprofen], and Fluvastatin    Review of Systems   Review of Systems  Cardiovascular:  Positive for chest pain.    Physical Exam Updated Vital Signs BP (!) 172/72   Pulse 61   Temp 98.2 F (36.8 C) (Oral)   Resp (!) 21   Ht 4' 11"$  (1.499 m)   Wt 64.4 kg   SpO2 100%   BMI 28.68 kg/m  Physical Exam Vitals and  nursing note reviewed. Exam conducted with a chaperone present.  Constitutional:      Appearance: Normal appearance.  HENT:     Head: Normocephalic and atraumatic.  Eyes:     General: No scleral icterus.       Right eye: No discharge.        Left eye: No discharge.     Extraocular Movements: Extraocular movements intact.     Pupils: Pupils are equal, round, and reactive to light.  Cardiovascular:     Rate and Rhythm: Normal rate and regular rhythm.     Pulses: Normal pulses.     Heart sounds: Normal heart sounds.     No friction rub. No gallop.     Comments: Upper and lower extremity pulses are symmetric  bilaterally Pulmonary:     Effort: Pulmonary effort is normal. No respiratory distress.     Breath sounds: Normal breath sounds.  Abdominal:     General: Abdomen is flat. Bowel sounds are normal. There is no distension.     Palpations: Abdomen is soft.     Tenderness: There is no abdominal tenderness.  Musculoskeletal:     Right lower leg: No edema.     Left lower leg: No edema.  Skin:    General: Skin is warm and dry.     Coloration: Skin is not jaundiced.  Neurological:     Mental Status: She is alert. Mental status is at baseline.     Coordination: Coordination normal.     ED Results / Procedures / Treatments   Labs (all labs ordered are listed, but only abnormal results are displayed) Labs Reviewed  BASIC METABOLIC PANEL - Abnormal; Notable for the following components:      Result Value   Potassium 3.3 (*)    Glucose, Bld 113 (*)    All other components within normal limits  CBC  TROPONIN I (HIGH SENSITIVITY)  TROPONIN I (HIGH SENSITIVITY)    EKG EKG Interpretation  Date/Time:  Tuesday July 24 2022 13:18:02 EST Ventricular Rate:  67 PR Interval:  148 QRS Duration: 122 QT Interval:  428 QTC Calculation: 452 R Axis:   -11 Text Interpretation: Sinus rhythm with Fusion complexes Right bundle branch block When compared with ECG of 07-Jul-2021 21:25, PREVIOUS ECG IS PRESENT No significant change since last tracing Confirmed by Blanchie Dessert [id number]) on 07/24/2022 5:30:57 PM  Radiology DG Chest 2 View  Result Date: 07/24/2022 CLINICAL DATA:  Sharp chest pain. Tooth and gum pain today. History of sarcoidosis and breast cancer. EXAM: CHEST - 2 VIEW COMPARISON:  Chest radiographs 07/07/2021 and 02/24/2021; CT chest 09/11/2006 FINDINGS: Cardiac silhouette is again mildly enlarged. Mild chronic interstitial thickening is unchanged. Note is made of extensive interstitial thickening and bilateral pulmonary nodularity on prior remote 09/11/2006 CT, and the current  chronic/baseline interstitial thickening is presumably secondary to the patient's history of pulmonary fibrosis and interstitial scarring. Mild prominence of the bilateral hila is unchanged from 02/24/2021 and also may be secondary to the patient's chronic sarcoid. 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 The lungs are clear.  No pleural effusion or pneumothorax. Mild-to-moderate multilevel degenerative disc changes of the thoracic spine. IMPRESSION: 1. No acute cardiopulmonary process. No significant change from prior. 2. Chronic bilateral interstitial thickening, presumably secondary to the patient's history of pulmonary fibrosis and interstitial scarring. Electronically Signed   By: Viann Fish.D.  On: 07/24/2022 14:36    Procedures Procedures    Medications Ordered in ED Medications  aspirin chewable tablet 162 mg (162 mg Oral Given 07/24/22 1626)    ED Course/ Medical Decision Making/ A&P                             Medical Decision Making Risk OTC drugs.   Patient is a 77 year old female presenting to the emergency department due to chest pain.  Differential includes ACS, PE, pneumonia, aortic dissection, [place]ave esophagus, AAA, CHF, myocarditis or pericarditis.  Patient's daughters are at bedside providing independent history.  I also reviewed external medical records including previous pulmonology notes and echocardiogram.  Her last echo was in 2020 preserved EF, she had an ED visit about a year ago with very similar presentation history as documented above.  CBC without leukocytosis or anemia.  BMP without gross electrolyte derangement or AKI, mild hypokalemia with a potassium of 3.3.  Negative delta troponin, 6--->7.  Chest x-ray negative for acute process, borderline  cardiomegaly and interstitial lung changes.  EKG shows right bundle branch block and sinus  rhythm, no ischemic changes.  Patient on cardiac monitoring in sinus rhythm per my interpretation.  The patient multiple times, she has remained in sinus rhythm on cardiac monitoring.  No ischemic changes, upper and lower extremity pulses are symmetric, presentation is not consistent with a dissection.  Considered PE but no hypoxia, tachypnea or pleuritic chest pain.  Think less likely.  Considered ACS but given negative delta troponin and no ischemic changes on EKG I feel she is appropriate close outpatient follow-up with cardiology.  Return precautions were discussed with the patient and family verbalized understanding.  Discussed HPI, physical exam and plan of care for this patient with attending [facility]. The attending physician evaluated this patient as part of a shared visit and agrees with plan of care.         Final Clinical Impression(s) / ED Diagnoses Final diagnoses:  Nonspecific chest pain    Rx / DC Orders ED Discharge Orders          Ordered    Ambulatory referral to Cardiology        07/24/22 1747              Sherrill Raring, Hershal Coria 07/24/22 2256    Blanchie Dessert, MD 07/25/22 2126

## 2022-07-24 NOTE — Discharge Instructions (Signed)
You are seen today in the emergency department due to chest pain.  Workup today was reassuring, please follow-up with your cardiologist and we do not have a cardiologist you will hear from the heart care Center to establish an appointment.  If the chest pain comes back, is persistent, you have new or concerning symptoms you return back to the ED for further evaluation.

## 2022-07-24 NOTE — ED Triage Notes (Signed)
Pt arrived POV from home c/o centralized CP that started right before she got here. Pt states her jaw is also hurting. Pt endorses lightheadedness but denies SHOB, N/V or dizziness.

## 2022-07-24 NOTE — ED Provider Triage Note (Signed)
Emergency Medicine Provider Triage Evaluation Note  Mariah Burns , a 77 y.o. female  was evaluated in triage.  Pt complains of cp.  Side of chest pressure radiating to bilateral jaw area starting about 30 minutes ago.  She has a history of GERD but uses diet to control that.  She has history of diabetes, hypertension, hyperlipidemia, no known history of cardiac disease.  Symptoms are resolved.  She denies associated nausea, vomiting, shortness of breath or diaphoresis..  Review of Systems  Positive: Chest pain and jaw pain bilaterally Negative: Shortness of breath  Physical Exam  BP (!) 199/68 (BP Location: Right Arm)   Pulse 66   Temp 98.3 F (36.8 C) (Oral)   Resp 16   Ht 4' 11"$  (1.499 m)   Wt 64.4 kg   SpO2 99%   BMI 28.68 kg/m  Gen:   Awake, no distress   Resp:  Normal effort  MSK:   Moves extremities without difficulty  Other:  Regular rate and rhythm  Medical Decision Making  Medically screening exam initiated at 1:43 PM.  Appropriate orders placed.  [name] was informed that the remainder of the evaluation will be completed by another provider, this initial triage assessment does not replace that evaluation, and the importance of remaining in the ED until their evaluation is complete.     Margarita Mail, PA-C 07/24/22 1344

## 2022-07-26 NOTE — Progress Notes (Signed)
Cardiology Office Note:    Date:  07/27/2022   ID:  Mariah Burns, DOB 10-Feb-1946, MRN [id number]  PCP:  Mariah Burns, [place] Providers Cardiologist:  Mariah Lean, MD     Referring MD: Mariah Chard, MD   CC: Establish with new cardiologist- chest pain syndrome  History of Present Illness:    Mariah Burns is a 77 y.o. female with a hx of HTN, HFpEF, pulmonary sarcoidosis, and OSA on CPAP. History of breast cancer with 2020 seed radiation, but without cardiotoxic chemotherapy or immuno therapy. 2020: Echo shows elevated LA pressure, small, circumferential pericardial effusion, an Moderate central tricuspid regurgitation without RV dilation or dysfunction.  No chest pain or pressure since leaving the hospital.  No SOB/DOE and no PND/Orthopnea.  No weight gain or leg swelling.    Sees Mariah Burns for Rheum. Her arthritis has been worsening since last.  Exercises at the [facility]. Does exercise associated heart rate increase.  Improves with rest. When she does water aerobics.   Had recent ED visit for chest pain:associated with cough.  Negative troponin.  Mild AKI, K of 3.3.  Found to have RBBB (hx of iRBBB)  Ambulatory blood pressure has been labile BP lowest SBP 140s.   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: failed Imuran & Prednisone therapy d/t adverse side effects TLC 106%, DLCO 75% 2009    Past Surgical History:  Procedure Laterality Date   BREAST LUMPECTOMY WITH RADIOACTIVE SEED AND SENTINEL LYMPH NODE BIOPSY Right 09/02/2018   Procedure: RIGHT BREAST LUMPECTOMY WITH RADIOACTIVE SEED AND  RIGHT SENTINEL LYMPH NODE BIOPSY;  Surgeon: Mariah Seltzer, MD;  Location: [place];  Service: General;  Laterality: Right;   BRONCHOSCOPY     dx'd sarcoid   KNEE ARTHROPLASTY Left 03/13/2017   NEUROPLASTY / TRANSPOSITION ULNAR NERVE AT ELBOW  09/2011   TOTAL ABDOMINAL HYSTERECTOMY      Current Medications: Current Meds  Medication Sig   amLODipine (NORVASC) 5 MG tablet Take 1 tablet (5 mg total) by mouth daily.   aspirin EC 81 MG tablet Take 81 mg by mouth daily. Swallow whole.   CALCIUM PO Take by mouth daily.   Cholecalciferol (VITAMIN D-3 PO) Take 1,000 Units by mouth daily.   Cyanocobalamin (VITAMIN B-12 PO)    famotidine (PEPCID) 40 MG tablet as needed.   felodipine (PLENDIL) 10 MG 24 hr tablet TAKE 1 TABLET BY MOUTH EVERY DAY   fish oil-omega-3 fatty acids 1000 MG capsule Take 2 capsules by mouth daily.   Fluocinolone Acetonide Scalp 0.01 % OIL Apply to affected area of scalp as directed 1-2 x/day as needed for itching   GLUCOSAMINE-CHONDROITIN PO Take 2 tablets by mouth daily.   L-Methylfolate-Algae-B12-B6 (METANX) 3-90.314-2-35 MG CAPS Take 1 capsule by mouth 2 (two) times daily.    Magnesium 250 MG TABS Take by mouth daily. Tues, Thursday, Saturday   Menthol, Topical Analgesic, (BIOFREEZE ROLL-ON EX) Apply 1 application. topically as needed (pain in back,neck,thumb).   nitroGLYCERIN (NITROSTAT) 0.4 MG SL tablet Place 1 tablet (0.4 mg total) under the tongue every 5 (five) minutes as needed  for chest pain.   nystatin (MYCOSTATIN/NYSTOP) powder as needed (skin irritation).   Polyvinyl Alcohol-Povidone PF 1.4-0.6 % SOLN Place 1 drop into both eyes daily as needed (dry eyes).    RESTASIS 0.05 % ophthalmic emulsion as needed (dry eyes).   TART CHERRY PO    Current Facility-Administered Medications for the 07/27/22 encounter (Office Visit) with Rudean Haskell A, MD  Medication   0.9 %  sodium chloride infusion     Allergies:   Fosamax [alendronate sodium], Omeprazole, Motrin [ibuprofen], and Fluvastatin   Social History    Socioeconomic History   Marital status: Divorced    Spouse name: Not on file   Number of children: 3   Years of education: College   Highest education level: Not on file  Occupational History   Occupation: Retired    Fish farm manager: RETIRED  Tobacco Use   Smoking status: Never    Passive exposure: Never   Smokeless tobacco: Never  Vaping Use   Vaping Use: Never used  Substance and Sexual Activity   Alcohol use: No   Drug use: No   Sexual activity: Not Currently  Other Topics Concern   Not on file  Social History Narrative   05/23/21 Patient lives at home with youngest daughter.   Caffeine Use: none   Social Determinants of Health   Financial Resource Strain: Low Risk  (12/06/2021)   Overall Financial Resource Strain (CARDIA)    Difficulty of Paying Living Expenses: Not hard at all  Food Insecurity: No Food Insecurity (12/06/2021)   Hunger Vital Sign    Worried About Running Out of Food in the Last Year: Never true    Ran Out of Food in the Last Year: Never true  Transportation Needs: No Transportation Needs (12/06/2021)   PRAPARE - Hydrologist (Medical): No    Lack of Transportation (Non-Medical): No  Physical Activity: Sufficiently Active (12/06/2021)   Exercise Vital Sign    Days of Exercise per Week: 5 days    Minutes of Exercise per Session: 60 min  Stress: No Stress Concern Present (12/06/2021)   [place]    Feeling of Stress : Not at all  Social Connections: Not on file     Family History: The patient's family history includes Allergies in her daughter, mother, and sister; Gout in her brother and brother; Healthy in her daughter and daughter; Heart disease in her brother, sister, and sister; Heart failure in her father; Hypertension in her brother, brother, brother, and brother; Lupus in her sister; Post-traumatic stress disorder in her brother.  ROS:   Please see the history  of present illness.     EKGs/Labs/Other Studies Reviewed:    The following studies were reviewed today:   EKG:  EKG is ordered today.  The ekg ordered today demonstrates  07/27/22: SR rate 67 with RBBB and artifact  Cardiac Studies & Procedures       ECHOCARDIOGRAM  ECHOCARDIOGRAM COMPLETE 02/13/2019  Narrative ECHOCARDIOGRAM REPORT    Patient Name:   Mariah Burns Date of Exam: 02/13/2019 Medical Rec #:  [id number]         Height:       59.0 in Accession #:    [id number]        Weight:       144.9 lb Date of Birth:  [birth year]        BSA:  1.61 m Patient Age:    54 years          BP:           148/62 mmHg Patient Gender: F                 HR:           74 bpm. Exam Location:  [address]   Procedure: 2D Echo, Cardiac Doppler and Color Doppler  Indications:    I10 Hypertension  History:        Patient has prior history of Echocardiogram examinations, most recent 02/24/2015. Risk Factors: Hypertension, Dyslipidemia and Pre-diabetes. Sarcoidosis. Obstructive sleep apnea. Anxiety,.  Sonographer:    Cresenciano Lick RDCS Referring Phys: [place]   1. The left ventricle has hyperdynamic systolic function, with an ejection fraction of >65%. The cavity size was normal. Left ventricular diastolic Doppler parameters are consistent with pseudonormalization. Elevated left atrial and left ventricular end-diastolic pressures The E/e' is 19. No evidence of left ventricular regional wall motion abnormalities. 2. The right ventricle has normal systolic function. The cavity was normal. There is no increase in right ventricular wall thickness. Right ventricular systolic pressure is normal. 3. Left atrial size was mildly dilated. 4. Small pericardial effusion. 5. The pericardial effusion is circumferential. 6. No evidence of mitral valve stenosis. 7. Tricuspid valve regurgitation is moderate. 8. The aortic valve is tricuspid. Aortic valve  regurgitation is trivial by color flow Doppler. No stenosis of the aortic valve. 9. The aorta is normal unless otherwise noted. 10. The aortic root, ascending aorta and aortic arch are normal in size and structure.  FINDINGS Left Ventricle: The left ventricle has hyperdynamic systolic function, with an ejection fraction of >65%. The cavity size was normal. There is no increase in left ventricular wall thickness. Left ventricular diastolic Doppler parameters are consistent with pseudonormalization. Elevated left atrial and left ventricular end-diastolic pressures The E/e' is 19. No evidence of left ventricular regional wall motion abnormalities..  Right Ventricle: The right ventricle has normal systolic function. The cavity was normal. There is no increase in right ventricular wall thickness. Right ventricular systolic pressure is normal.  Left Atrium: Left atrial size was mildly dilated.  Right Atrium: Right atrial size was normal in size.  Interatrial Septum: No atrial level shunt detected by color flow Doppler.  Pericardium: A small pericardial effusion is present. The pericardial effusion is circumferential.  Mitral Valve: The mitral valve is normal in structure. Mitral valve regurgitation is mild by color flow Doppler. No evidence of mitral valve stenosis.  Tricuspid Valve: The tricuspid valve is normal in structure. Tricuspid valve regurgitation is moderate by color flow Doppler.  Aortic Valve: The aortic valve is tricuspid Aortic valve regurgitation is trivial by color flow Doppler. There is No stenosis of the aortic valve.  Pulmonic Valve: The pulmonic valve was grossly normal. Pulmonic valve regurgitation is not visualized by color flow Doppler. No evidence of pulmonic stenosis.  Aorta: The aortic root, ascending aorta and aortic arch are normal in size and structure. The aorta is normal unless otherwise noted.  Pulmonary Artery: The pulmonary artery is not well seen.  Venous:  The inferior vena cava is normal in size with greater than 50% respiratory variability.   +--------------+--------++ LEFT VENTRICLE         +----------------+---------++ +--------------+--------++ Diastology                PLAX 2D                +----------------+---------++ +--------------+--------++  LV e' lateral:  6.85 cm/s LVIDd:        3.90 cm  +----------------+---------++ +--------------+--------++ LV E/e' lateral:16.8      LVIDs:        2.20 cm  +----------------+---------++ +--------------+--------++ LV e' medial:   5.22 cm/s LV PW:        1.10 cm  +----------------+---------++ +--------------+--------++ LV E/e' medial: 22.0      LV IVS:       1.10 cm  +----------------+---------++ +--------------+--------++ LVOT diam:    1.80 cm  +--------------+--------++ LV SV:        50 ml    +--------------+--------++ LV SV Index:  29.61    +--------------+--------++ LVOT Area:    2.54 cm +--------------+--------++                        +--------------+--------++  +---------------+----------++ RIGHT VENTRICLE           +---------------+----------++ RV Basal diam: 3.10 cm    +---------------+----------++ RV S prime:    12.50 cm/s +---------------+----------++ TAPSE (M-mode):2.1 cm     +---------------+----------++ RVSP:          30.5 mmHg  +---------------+----------++  +---------------+-------++-----------++ LEFT ATRIUM           Index       +---------------+-------++-----------++ LA diam:       4.70 cm2.92 cm/m  +---------------+-------++-----------++ LA Vol (A2C):  58.5 ml36.37 ml/m +---------------+-------++-----------++ LA Vol (A4C):  52.9 ml32.89 ml/m +---------------+-------++-----------++ LA Biplane Vol:56.4 ml35.07 ml/m +---------------+-------++-----------++ +------------+---------++-----------++ RIGHT ATRIUM         Index        +------------+---------++-----------++ RA Pressure:3.00 mmHg            +------------+---------++-----------++ RA Area:    12.30 cm            +------------+---------++-----------++ RA Volume:  30.00 ml 18.65 ml/m +------------+---------++-----------++ +------------+-----------++ AORTIC VALVE            +------------+-----------++ LVOT Vmax:  135.00 cm/s +------------+-----------++ LVOT Vmean: 91.350 cm/s +------------+-----------++ LVOT VTI:   0.302 m     +------------+-----------++  +-------------+-------++ AORTA                +-------------+-------++ Ao Root diam:2.60 cm +-------------+-------++  +--------------+----------++  +---------------+-----------++ MITRAL VALVE              TRICUSPID VALVE            +--------------+----------++  +---------------+-----------++ MV Area (PHT):2.99 cm    TR Peak grad:  27.5 mmHg   +--------------+----------++  +---------------+-----------++ MV PHT:       73.66 msec  TR Vmax:       298.00 cm/s +--------------+----------++  +---------------+-----------++ MV Decel Time:254 msec    Estimated RAP: 3.00 mmHg   +--------------+----------++  +---------------+-----------++ +--------------+-----------++ RVSP:          30.5 mmHg   MV E velocity:115.00 cm/s +---------------+-----------++ +--------------+-----------++ MV A velocity:94.70 cm/s  +--------------+-------+ +--------------+-----------++ SHUNTS                MV E/A ratio: 1.21        +--------------+-------+ +--------------+-----------++ Systemic VTI: 0.30 m  +--------------+-------+ Systemic Diam:1.80 cm +--------------+-------+   Buford Dresser MD Electronically signed by Buford Dresser MD Signature Date/Time: 02/13/2019/6:31:58 PM    Final              Recent Labs: 02/28/2022: ALT 16 07/24/2022: BUN 11; Creatinine, Ser 0.80; Hemoglobin 12.6; Platelets  172; Potassium 3.3; Sodium 138  Recent Lipid Panel  Component Value Date/Time   CHOL 176 04/24/2022 1427   TRIG 103 04/24/2022 1427   HDL 61 04/24/2022 1427   CHOLHDL 2.9 04/24/2022 1427   LDLCALC 96 04/24/2022 1427     Risk Assessment/Calculations:    HYPERTENSION CONTROL Vitals:   07/27/22 0809 07/27/22 0833  BP: (!) 150/68 (!) 166/78    The patient's blood pressure is elevated above target today.  In order to address the patient's elevated BP: A new medication was prescribed today.        Physical Exam:    VS:  BP (!) 166/78   Pulse 65   Ht '4\' 11"'$  (1.499 m)   Wt 147 lb (66.7 kg)   SpO2 99%   BMI 29.69 kg/m     Wt Readings from Last 3 Encounters:  07/27/22 147 lb (66.7 kg)  07/24/22 142 lb (64.4 kg)  06/13/22 145 lb (65.8 kg)    GEN:  Well nourished, well developed in no acute distress HEENT: Normal NECK: No JVD CARDIAC: RRR, no murmurs, rubs, gallops RESPIRATORY:  Clear to auscultation without rales, wheezing or rhonchi  ABDOMEN: Soft, non-tender, non-distended MUSCULOSKELETAL:  No edema; No deformity  SKIN: Warm and dry NEUROLOGIC:  Alert and oriented x 3 PSYCHIATRIC:  Normal affect   ASSESSMENT:    1. Chronic diastolic heart failure ([place])   2. Tricuspid valve insufficiency, unspecified etiology   3. Essential hypertension   4. Pericardial effusion   5. Obstructive sleep apnea   6. Medication management    PLAN:    Chest pain (DDX non cardiac, HTN, and CAD) - R breast cancer s/p chest radiation; OSA on CPAP - BMP-> If AKI has resolved CCTA - if worsening AKI, f/u with PCP; will do PET MPI  - start amlodipine 5 mg - Pharm D BP f/u  HLD - LDL 96 - she has hesitancy with atorvastatin due to dizziness; I am not sure if this is related; will use ischemic testing to help her with procs and cons  Moderate tricuspid regurgitation - with hx of OSA with questionable compliance, normal RV function and pulmonary sarcoidosis - will repeat  echo  Pericardial effusion - echo as above  RBBB -Monitor     Me or APP in 4 months     Medication Adjustments/Labs and Tests Ordered: Current medicines are reviewed at length with the patient today.  Concerns regarding medicines are outlined above.  Orders Placed This Encounter  Procedures   Basic metabolic panel   AMB Referral to [facility] Pharm[facility]   ECHOCARDIOGRAM COMPLETE   Meds ordered this encounter  Medications   amLODipine (NORVASC) 5 MG tablet    Sig: Take 1 tablet (5 mg total) by mouth daily.    Dispense:  90 tablet    Refill:  3    Patient Instructions  Medication Instructions:  Please start Amlodipine 5 mg daily. Continue all other medications as listed.  *If you need a refill on your cardiac medications before your next appointment, please call your pharmacy*   Lab Work: Please have BMP today.  If you have labs (blood work) drawn today and your tests are completely normal, you will receive your results only by: [place] (if you have MyChart) OR A paper copy in the mail If you have any lab test that is abnormal or we need to change your treatment, we will call you to review the results.   Testing/Procedures: Your physician has requested that you have an echocardiogram. Echocardiography is a painless  test that uses sound waves to create images of your heart. It provides your doctor with information about the size and shape of your heart and how well your heart's chambers and valves are working. This procedure takes approximately one hour. There are no restrictions for this procedure. Please do NOT wear cologne, perfume, aftershave, or lotions (deodorant is allowed). Please arrive 15 minutes prior to your appointment time.  You have been referred to our Hypertension Clinic.   Follow-Up: At [facility], you and your health needs are our priority.  As part of our continuing mission to provide you with exceptional heart care, we have  created designated Provider Care Teams.  These Care Teams include your primary Cardiologist (physician) and Advanced Practice Providers (APPs -  Physician Assistants and Nurse Practitioners) who all work together to provide you with the care you need, when you need it.  We recommend signing up for the patient portal called "MyChart".  Sign up information is provided on this After Visit Summary.  MyChart is used to connect with patients for Virtual Visits (Telemedicine).  Patients are able to view lab/test results, encounter notes, upcoming appointments, etc.  Non-urgent messages can be sent to your provider as well.   To learn more about what you can do with MyChart, go to [email or web address].    Your next appointment:   4 month(s)  Provider:   Nicholes Rough, PA-C, Melina Copa, PA-C, Ambrose Pancoast, NP, Ermalinda Barrios, PA-C, Christen Bame, NP, or Richardson Dopp, PA-C        Or Dr Julieanne Manson    Signed, Mariah Lean, MD  07/27/2022 8:56 AM    [place]

## 2022-07-27 ENCOUNTER — Ambulatory Visit: Payer: Medicare PPO | Attending: Internal Medicine | Admitting: Internal Medicine

## 2022-07-27 ENCOUNTER — Encounter: Payer: Self-pay | Admitting: Internal Medicine

## 2022-07-27 VITALS — BP 166/78 | HR 65 | Ht 59.0 in | Wt 147.0 lb

## 2022-07-27 DIAGNOSIS — G4733 Obstructive sleep apnea (adult) (pediatric): Secondary | ICD-10-CM

## 2022-07-27 DIAGNOSIS — I3139 Other pericardial effusion (noninflammatory): Secondary | ICD-10-CM

## 2022-07-27 DIAGNOSIS — Z79899 Other long term (current) drug therapy: Secondary | ICD-10-CM | POA: Diagnosis not present

## 2022-07-27 DIAGNOSIS — I5032 Chronic diastolic (congestive) heart failure: Secondary | ICD-10-CM | POA: Diagnosis not present

## 2022-07-27 DIAGNOSIS — I071 Rheumatic tricuspid insufficiency: Secondary | ICD-10-CM

## 2022-07-27 DIAGNOSIS — I10 Essential (primary) hypertension: Secondary | ICD-10-CM | POA: Diagnosis not present

## 2022-07-27 LAB — BASIC METABOLIC PANEL
BUN/Creatinine Ratio: 16 (ref 12–28)
BUN: 13 mg/dL (ref 8–27)
CO2: 24 mmol/L (ref 20–29)
Calcium: 9.7 mg/dL (ref 8.7–10.3)
Chloride: 105 mmol/L (ref 96–106)
Creatinine, Ser: 0.79 mg/dL (ref 0.57–1.00)
Glucose: 102 mg/dL — ABNORMAL HIGH (ref 70–99)
Potassium: 3.8 mmol/L (ref 3.5–5.2)
Sodium: 143 mmol/L (ref 134–144)
eGFR: 77 mL/min/{1.73_m2} (ref >59)

## 2022-07-27 MED ORDER — AMLODIPINE BESYLATE 5 MG PO TABS: 5.0000 mg | ORAL_TABLET | Freq: Every day | ORAL | 3 refills | Status: DC

## 2022-07-27 NOTE — Patient Instructions (Signed)
Medication Instructions:  Please start Amlodipine 5 mg daily. Continue all other medications as listed.  *If you need a refill on your cardiac medications before your next appointment, please call your pharmacy*   Lab Work: Please have BMP today.  If you have labs (blood work) drawn today and your tests are completely normal, you will receive your results only by: [place] (if you have MyChart) OR A paper copy in the mail If you have any lab test that is abnormal or we need to change your treatment, we will call you to review the results.   Testing/Procedures: Your physician has requested that you have an echocardiogram. Echocardiography is a painless test that uses sound waves to create images of your heart. It provides your doctor with information about the size and shape of your heart and how well your heart's chambers and valves are working. This procedure takes approximately one hour. There are no restrictions for this procedure. Please do NOT wear cologne, perfume, aftershave, or lotions (deodorant is allowed). Please arrive 15 minutes prior to your appointment time.  You have been referred to our Hypertension Clinic.   Follow-Up: At [facility], you and your health needs are our priority.  As part of our continuing mission to provide you with exceptional heart care, we have created designated Provider Care Teams.  These Care Teams include your primary Cardiologist (physician) and Advanced Practice Providers (APPs -  Physician Assistants and Nurse Practitioners) who all work together to provide you with the care you need, when you need it.  We recommend signing up for the patient portal called "MyChart".  Sign up information is provided on this After Visit Summary.  MyChart is used to connect with patients for Virtual Visits (Telemedicine).  Patients are able to view lab/test results, encounter notes, upcoming appointments, etc.  Non-urgent messages can be sent to  your provider as well.   To learn more about what you can do with MyChart, go to [email or web address].    Your next appointment:   4 month(s)  Provider:   Nicholes Rough, PA-C, Melina Copa, PA-C, Ambrose Pancoast, NP, Ermalinda Barrios, PA-C, Christen Bame, NP, or Richardson Dopp, PA-C        Or Dr Julieanne Manson

## 2022-07-31 ENCOUNTER — Encounter: Payer: Self-pay | Admitting: Internal Medicine

## 2022-07-31 ENCOUNTER — Telehealth: Payer: Self-pay

## 2022-07-31 DIAGNOSIS — R072 Precordial pain: Secondary | ICD-10-CM

## 2022-07-31 NOTE — Telephone Encounter (Signed)
-----   Message from Nuala Alpha, LPN sent at [id number]  8:30 AM EST -----  ----- Message ----- From: Werner Lean, MD Sent: 07/28/2022   8:25 AM EST To: [place] Chard, MD; [address] Triage  Testing Results WNL.  Cardiac CT as discussed in prior note

## 2022-07-31 NOTE — Telephone Encounter (Signed)
The patient has been notified of the result and verbalized understanding.  All questions (if any) were answered. Precious Gilding, RN 07/31/2022 12:04 PM

## 2022-08-03 NOTE — Telephone Encounter (Signed)
Left a message to call back.   Need to go over Cardiac CT instructions; pending in letters tab.

## 2022-08-07 ENCOUNTER — Telehealth: Payer: Self-pay | Admitting: Internal Medicine

## 2022-08-07 MED ORDER — METOPROLOL TARTRATE 50 MG PO TABS: ORAL_TABLET | ORAL | 0 refills | Status: DC

## 2022-08-07 NOTE — Telephone Encounter (Signed)
Patient was returning call. Please advise  

## 2022-08-07 NOTE — Telephone Encounter (Signed)
Called pt reviewed reason for Cardiac CT as well as instructions.  Pt wrote down instructions does not have my chart.  Instructions mailed out to patient.  Pt request that I call daughter to review instructions. Called daughter Mariah Burns reviewed instructions.  Gave activation code for my chart; daughter will try to sign up today.  Advised that instruction letter will be on my chart for review. All questions answered.  Instruction letter mailed to pt.

## 2022-08-09 NOTE — Telephone Encounter (Signed)
Please see telephone encounter from 08/07/22 for details.

## 2022-08-13 ENCOUNTER — Telehealth (HOSPITAL_COMMUNITY): Payer: Self-pay | Admitting: *Deleted

## 2022-08-13 NOTE — Telephone Encounter (Signed)
Reaching out to patient's daughter (due to forgetfulness) to offer assistance regarding upcoming cardiac imaging study; pt's daughter verbalizes understanding of appt date/time, parking situation and where to check in, pre-test NPO status and medications ordered, and verified current allergies; name and call back number provided for further questions should they arise  Gordy Clement RN Navigator Cardiac Imaging [facility] [phone] office [phone] cell  Patient to take '50mg'$  metoprolol tartrate two hours prior to her cardiac CT scan. Her daughter is aware the patient is to arrive at 12:30PM.

## 2022-08-14 ENCOUNTER — Encounter: Payer: Self-pay | Admitting: Internal Medicine

## 2022-08-14 ENCOUNTER — Ambulatory Visit (HOSPITAL_COMMUNITY)
Admission: RE | Admit: 2022-08-14 | Discharge: 2022-08-14 | Disposition: A | Discharge status: Home / Self Care | Payer: Medicare PPO | Source: Ambulatory Visit | Attending: Internal Medicine | Admitting: Internal Medicine

## 2022-08-14 ENCOUNTER — Telehealth: Payer: Self-pay | Admitting: Internal Medicine

## 2022-08-14 ENCOUNTER — Ambulatory Visit: Payer: Medicare PPO

## 2022-08-14 VITALS — BP 140/72 | HR 68 | Temp 98.5°F | Ht 59.0 in | Wt 147.0 lb

## 2022-08-14 DIAGNOSIS — I700 Atherosclerosis of aorta: Secondary | ICD-10-CM

## 2022-08-14 DIAGNOSIS — I10 Essential (primary) hypertension: Secondary | ICD-10-CM

## 2022-08-14 DIAGNOSIS — R072 Precordial pain: Secondary | ICD-10-CM | POA: Insufficient documentation

## 2022-08-14 MED ORDER — VALSARTAN 40 MG PO TABS: 40.0000 mg | ORAL_TABLET | Freq: Every day | ORAL | 1 refills | Status: DC

## 2022-08-14 MED ORDER — NITROGLYCERIN 0.4 MG SL SUBL
SUBLINGUAL_TABLET | SUBLINGUAL | Status: AC
  Filled 2022-08-14: qty 2

## 2022-08-14 MED ORDER — IOHEXOL 350 MG/ML SOLN
100.0000 mL | Freq: Once | INTRAVENOUS | Status: AC | PRN
  Administered 2022-08-14: 100 mL via INTRAVENOUS

## 2022-08-14 MED ORDER — NITROGLYCERIN 0.4 MG SL SUBL
0.8000 mg | SUBLINGUAL_TABLET | Freq: Once | SUBLINGUAL | Status: AC
  Administered 2022-08-14: 0.8 mg via SUBLINGUAL

## 2022-08-14 NOTE — Telephone Encounter (Signed)
Left message to call the clinic. 

## 2022-08-14 NOTE — Progress Notes (Addendum)
Patient presents today for a BP check patient taking amLODipine '5mg'$ , felodipine '10mg'$ . BP Readings from Last 3 Encounters:  08/14/22 (!) 150/72  08/14/22 (!) 156/72  07/27/22 (!) 166/78  Per provider- start valsartan '40mg'$  take felodipine in am and valsartan in pm, let her know it protects the kidneys  nurse visit in 2 weeks w/ lab, check BMP dx: z79.899

## 2022-08-14 NOTE — Telephone Encounter (Signed)
Pt c/o BP issue: STAT if pt c/o blurred vision, one-sided weakness or slurred speech  1. What are your last 5 BP readings?   BP  221/68  172/68   156/65 (before cardiac CT)  173/68 (after cardiac CT)  2. Are you having any other symptoms (ex. Dizziness, headache, blurred vision, passed out)?   No  3. What is your BP issue?   Daughter states patient's BP is still reading high and wants to know next steps.

## 2022-08-14 NOTE — Patient Instructions (Signed)
Hypertension, Adult ?Hypertension is another name for high blood pressure. High blood pressure forces your heart to work harder to pump blood. This can cause problems over time. ?There are two numbers in a blood pressure reading. There is a top number (systolic) over a bottom number (diastolic). It is best to have a blood pressure that is below 120/80. ?What are the causes? ?The cause of this condition is not known. Some other conditions can lead to high blood pressure. ?What increases the risk? ?Some lifestyle factors can make you more likely to develop high blood pressure: ?Smoking. ?Not getting enough exercise or physical activity. ?Being overweight. ?Having too much fat, sugar, calories, or salt (sodium) in your diet. ?Drinking too much alcohol. ?Other risk factors include: ?Having any of these conditions: ?Heart disease. ?Diabetes. ?High cholesterol. ?Kidney disease. ?Obstructive sleep apnea. ?Having a family history of high blood pressure and high cholesterol. ?Age. The risk increases with age. ?Stress. ?What are the signs or symptoms? ?High blood pressure may not cause symptoms. Very high blood pressure (hypertensive crisis) may cause: ?Headache. ?Fast or uneven heartbeats (palpitations). ?Shortness of breath. ?Nosebleed. ?Vomiting or feeling like you may vomit (nauseous). ?Changes in how you see. ?Very bad chest pain. ?Feeling dizzy. ?Seizures. ?How is this treated? ?This condition is treated by making healthy lifestyle changes, such as: ?Eating healthy foods. ?Exercising more. ?Drinking less alcohol. ?Your doctor may prescribe medicine if lifestyle changes do not help enough and if: ?Your top number is above 130. ?Your bottom number is above 80. ?Your personal target blood pressure may vary. ?Follow these instructions at home: ?Eating and drinking ? ?If told, follow the DASH eating plan. To follow this plan: ?Fill one half of your plate at each meal with fruits and vegetables. ?Fill one fourth of your plate  at each meal with whole grains. Whole grains include whole-wheat pasta, brown rice, and whole-grain bread. ?Eat or drink low-fat dairy products, such as skim milk or low-fat yogurt. ?Fill one fourth of your plate at each meal with low-fat (lean) proteins. Low-fat proteins include fish, chicken without skin, eggs, beans, and tofu. ?Avoid fatty meat, cured and processed meat, or chicken with skin. ?Avoid pre-made or processed food. ?Limit the amount of salt in your diet to less than 1,500 mg each day. ?Do not drink alcohol if: ?Your doctor tells you not to drink. ?You are pregnant, may be pregnant, or are planning to become pregnant. ?If you drink alcohol: ?Limit how much you have to: ?0-1 drink a day for women. ?0-2 drinks a day for men. ?Know how much alcohol is in your drink. In the [place] one drink equals one 12 oz bottle of beer (355 mL), one 5 oz glass of wine (148 mL), or one 1? oz glass of hard liquor (44 mL). ?Lifestyle ? ?Work with your doctor to stay at a healthy weight or to lose weight. Ask your doctor what the best weight is for you. ?Get at least 30 minutes of exercise that causes your heart to beat faster (aerobic exercise) most days of the week. This may include walking, swimming, or biking. ?Get at least 30 minutes of exercise that strengthens your muscles (resistance exercise) at least 3 days a week. This may include lifting weights or doing Pilates. ?Do not smoke or use any products that contain nicotine or tobacco. If you need help quitting, ask your doctor. ?Check your blood pressure at home as told by your doctor. ?Keep all follow-up visits. ?Medicines ?Take over-the-counter and prescription medicines   only as told by your doctor. Follow directions carefully. ?Do not skip doses of blood pressure medicine. The medicine does not work as well if you skip doses. Skipping doses also puts you at risk for problems. ?Ask your doctor about side effects or reactions to medicines that you should watch  for. ?Contact a doctor if: ?You think you are having a reaction to the medicine you are taking. ?You have headaches that keep coming back. ?You feel dizzy. ?You have swelling in your ankles. ?You have trouble with your vision. ?Get help right away if: ?You get a very bad headache. ?You start to feel mixed up (confused). ?You feel weak or numb. ?You feel faint. ?You have very bad pain in your: ?Chest. ?Belly (abdomen). ?You vomit more than once. ?You have trouble breathing. ?These symptoms may be an emergency. Get help right away. Call 911. ?Do not wait to see if the symptoms will go away. ?Do not drive yourself to the hospital. ?Summary ?Hypertension is another name for high blood pressure. ?High blood pressure forces your heart to work harder to pump blood. ?For most people, a normal blood pressure is less than 120/80. ?Making healthy choices can help lower blood pressure. If your blood pressure does not get lower with healthy choices, you may need to take medicine. ?This information is not intended to replace advice given to you by your health care provider. Make sure you discuss any questions you have with your health care provider. ?Document Revised: 03/09/2021 Document Reviewed: 03/09/2021 ?Elsevier Patient Education ? 2023 Else[employer] Inc. ? ?

## 2022-08-15 ENCOUNTER — Other Ambulatory Visit: Payer: Self-pay | Admitting: Internal Medicine

## 2022-08-15 DIAGNOSIS — I10 Essential (primary) hypertension: Secondary | ICD-10-CM

## 2022-08-15 DIAGNOSIS — E7800 Pure hypercholesterolemia, unspecified: Secondary | ICD-10-CM

## 2022-08-15 DIAGNOSIS — E1122 Type 2 diabetes mellitus with diabetic chronic kidney disease: Secondary | ICD-10-CM

## 2022-08-15 NOTE — Telephone Encounter (Signed)
Pt stated she had an CT scan yesterday and her blood pressure was elevated BP       221/68             172/68              156/65 (before cardiac CT)             173/68 (after cardiac CT)  As of today the patient did not take her bp and is currently asymptomatic. Pt stated she will take her blood pressure and cal the clinic with the readings. Will forward to MD and nurse,

## 2022-08-15 NOTE — Progress Notes (Signed)
ref

## 2022-08-15 NOTE — Telephone Encounter (Signed)
Called pt daughter reports pt was seen by PCP yesterday.  Was started on Valsartan 40 mg PO QD.  Per daughter pt does not eat a lot of salt.  Eats boiled egg and fruit for breakfast may no eat lunch and a sensible dinner.  Does not eat TV dinners or a lot of sandwich meat.    Advised daughter to continue to monitor BP notify office if BP does not improve or if it drops below 110 and pt feels fatigued.  Daughter expresses understanding.  No further concerns voiced.

## 2022-08-15 NOTE — Telephone Encounter (Signed)
Daughter called back to report pt checked BP today 176/72-67.  Wants to know what pt should do.  Pt denies HA, fatigue and blurry vision.  Advised as long as pt is asymptomatic to continue all BP meds.  Recently added Valsartan needs time to get into pt system before will see a difference. Advised to call back in if needed.

## 2022-08-15 NOTE — Telephone Encounter (Signed)
Patient is returning call. Please advise? 

## 2022-08-17 ENCOUNTER — Encounter: Payer: Self-pay | Admitting: Internal Medicine

## 2022-08-17 ENCOUNTER — Other Ambulatory Visit: Payer: Self-pay | Admitting: Internal Medicine

## 2022-08-17 DIAGNOSIS — E1122 Type 2 diabetes mellitus with diabetic chronic kidney disease: Secondary | ICD-10-CM

## 2022-08-17 DIAGNOSIS — E7800 Pure hypercholesterolemia, unspecified: Secondary | ICD-10-CM

## 2022-08-17 DIAGNOSIS — I10 Essential (primary) hypertension: Secondary | ICD-10-CM

## 2022-08-17 DIAGNOSIS — E785 Hyperlipidemia, unspecified: Secondary | ICD-10-CM

## 2022-08-17 MED ORDER — EZETIMIBE 10 MG PO TABS: 10.0000 mg | ORAL_TABLET | Freq: Every day | ORAL | 11 refills | Status: DC

## 2022-08-21 ENCOUNTER — Ambulatory Visit: Payer: Medicare PPO

## 2022-08-21 VITALS — BP 148/70 | HR 67 | Temp 98.5°F | Ht 59.0 in | Wt 147.0 lb

## 2022-08-21 DIAGNOSIS — I10 Essential (primary) hypertension: Secondary | ICD-10-CM

## 2022-08-21 MED ORDER — AMLODIPINE BESYLATE 10 MG PO TABS: 10.0000 mg | ORAL_TABLET | Freq: Every day | ORAL | 1 refills | Status: DC

## 2022-08-21 NOTE — Progress Notes (Signed)
Patient presents today for a BP check, patient is currently taking amLODipine 5mg , valsartan 40mg ,Felodipine 10mg . BP Readings from Last 3 Encounters:  08/21/22 (!) 160/70  08/14/22 (!) 156/72  08/14/22 (!) 140/72   Per provider - stop the felodipine and increase amlodipine to 10mg  -TWO amlodipine 5mg  at night and Valsartan in AM  Meds sent to pharmacy.

## 2022-08-21 NOTE — Patient Instructions (Signed)
Take TWO amlodipine 5mg  at night and Valsartan in AM

## 2022-08-22 LAB — BMP8+EGFR
BUN/Creatinine Ratio: 17 (ref 12–28)
BUN: 14 mg/dL (ref 8–27)
CO2: 21 mmol/L (ref 20–29)
Calcium: 10.2 mg/dL (ref 8.7–10.3)
Chloride: 103 mmol/L (ref 96–106)
Creatinine, Ser: 0.84 mg/dL (ref 0.57–1.00)
Glucose: 108 mg/dL — ABNORMAL HIGH (ref 70–99)
Potassium: 3.6 mmol/L (ref 3.5–5.2)
Sodium: 141 mmol/L (ref 134–144)
eGFR: 72 mL/min/{1.73_m2} (ref >59)

## 2022-08-22 NOTE — Progress Notes (Cosign Needed)
Care Management & Coordination Services Pharmacy Note  08/22/2022 Name:  Mariah Burns MRN:  [id number] DOB:  Sep 17, 1945  Summary: Patient came in today for a follow up visit with her daughter. She has called her daughter Mariah Burns as well so they can both participate.   Recommendations/Changes made from today's visit: Recommend patient is onboarded to [facility] for medication packaging.   Follow up plan: Patients daughter will review options based on cost. HC to contact patients family about the cost of her medications with [facility].  We discussed in great detail the importance of taking her medication at the same time each day. Patient voiced understanding. She was also given a BP log sheet that she can use to record her BP readings and write when she is taking her medication for now until she is onboarded to the pharmacy.  Patient reports that she would like her medication delivered to her and packaged in a way that is easier for her take.    Subjective: Mariah Burns is an 77 y.o. year old female who is a primary patient of Mariah Chard, MD.  The care coordination team was consulted for assistance with disease management and care coordination needs.    Engaged with patient face to face for follow up visit.  Recent office visits:   Recent consult visits: 07/27/2022 Cardiology OV - patient started on Felodipine while taking Amlodipine   Hospital visits: 07/24/2022 ED VISIT - Unspecified chest pain   Objective:  Lab Results  Component Value Date   CREATININE 0.84 08/21/2022   BUN 14 08/21/2022   EGFR 72 08/21/2022   GFRNONAA >60 07/24/2022   GFRAA 85 05/26/2020   NA 141 08/21/2022   K 3.6 08/21/2022   CALCIUM 10.2 08/21/2022   CO2 21 08/21/2022   GLUCOSE 108 (H) 08/21/2022    Lab Results  Component Value Date/Time   HGBA1C 5.9 (H) 03/13/2022 02:02 PM   HGBA1C 6.0 (H) 01/31/2022 10:51 AM   MICROALBUR 30 02/23/2021 05:06 PM   MICROALBUR 30 02/23/2020  01:26 PM    Last diabetic Eye exam:  Lab Results  Component Value Date/Time   HMDIABEYEEXA No Retinopathy 10/31/2021 12:00 AM    Last diabetic Foot exam: No results found for: "HMDIABFOOTEX"   Lab Results  Component Value Date   CHOL 176 04/24/2022   HDL 61 04/24/2022   LDLCALC 96 04/24/2022   TRIG 103 04/24/2022   CHOLHDL 2.9 04/24/2022       Latest Ref Rng & Units 02/28/2022   12:14 PM 01/31/2022   10:51 AM 08/23/2021   10:14 AM  Hepatic Function  Total Protein 6.5 - 8.1 g/dL 7.9  7.6  7.6   Albumin 3.5 - 5.0 g/dL 4.3  4.5  4.2   AST 15 - 41 U/L 21  20  19    ALT 0 - 44 U/L 16  15  15    Alk Phosphatase 38 - 126 U/L 65  76  63   Total Bilirubin 0.3 - 1.2 mg/dL 0.4  0.5  0.5     Lab Results  Component Value Date/Time   TSH 1.170 11/02/2020 10:36 AM   TSH 1.240 06/11/2019 02:24 PM       Latest Ref Rng & Units 07/24/2022    1:52 PM 02/28/2022   12:14 PM 01/31/2022   10:51 AM  CBC  WBC 4.0 - 10.5 K/uL 4.1  4.1  4.2   Hemoglobin 12.0 - 15.0 g/dL 12.6  11.9  11.8  Hematocrit 36.0 - 46.0 % 39.4  36.4  36.9   Platelets 150 - 400 K/uL 172  190  189     Lab Results  Component Value Date/Time   VD25OH 43.0 10/28/2019 11:37 AM   VITAMINB12 >2000 (H) 02/23/2021 04:11 PM   VITAMINB12 >2000 (H) 11/02/2020 10:36 AM    Clinical ASCVD: No  The ASCVD Risk score (Arnett DK, et al., 2019) failed to calculate for the following reasons:   Unable to determine if patient is Non-Hispanic African American        01/31/2022   10:08 AM 12/06/2021    9:48 AM 10/05/2021   11:58 AM  Depression screen PHQ 2/9  Decreased Interest 0 0 0  Down, Depressed, Hopeless 0 0 0  PHQ - 2 Score 0 0 0  Altered sleeping 0  0  Tired, decreased energy 0  0  Change in appetite 0  0  Feeling bad or failure about yourself  0  0  Trouble concentrating 0  0  Moving slowly or fidgety/restless 0  0  Suicidal thoughts 0  0  PHQ-9 Score 0  0  Difficult doing work/chores Not difficult at all       Social  History   Tobacco Use  Smoking Status Never   Passive exposure: Never  Smokeless Tobacco Never   BP Readings from Last 3 Encounters:  08/21/22 (!) 148/70  08/14/22 (!) 156/72  08/14/22 (!) 140/72   Pulse Readings from Last 3 Encounters:  08/21/22 67  08/14/22 68  07/27/22 65   Wt Readings from Last 3 Encounters:  08/21/22 147 lb (66.7 kg)  08/14/22 147 lb (66.7 kg)  07/27/22 147 lb (66.7 kg)   BMI Readings from Last 3 Encounters:  08/21/22 29.69 kg/m  08/14/22 29.69 kg/m  07/27/22 29.69 kg/m    Allergies  Allergen Reactions   Fosamax [Alendronate Sodium] Other (See Comments)    REACTION: "CAUSE GI UPSET AND FATIGUE"   Omeprazole Other (See Comments)    Lower ab pain   Motrin [Ibuprofen] Other (See Comments)    GI upset- has to eat prior to taking   Fluvastatin Other (See Comments)    mild memoryproblems of confusion     Medications Reviewed Today     Reviewed by Tonie Griffith, CMA (Certified Medical Assistant) on 08/21/22 at [facility] List Status: <None>   Medication Order Taking? Sig Documenting Provider Last Dose Status Informant  0.9 %  sodium chloride infusion [id number]   Minette Brine, FNP  Active   aspirin EC 81 MG tablet [id number] Yes Take 81 mg by mouth daily. Swallow whole. [provider] Taking Active   atorvastatin (LIPITOR) 40 MG tablet [id number] No Take one tablet by mouth on Monday-Friday, skip weekends.  Patient not taking: Reported on 07/27/2022   Mariah Chard, MD Not Taking Active   CALCIUM PO [id number] Yes Take by mouth daily. [provider] Taking Active   Cholecalciferol (VITAMIN D-3 PO) [id number] Yes Take 1,000 Units by mouth daily. [provider] Taking Active   Cyanocobalamin (VITAMIN B-12 PO) [id number] Yes  [provider] Taking Active   ezetimibe (ZETIA) 10 MG tablet [id number] Yes Take 1 tablet (10 mg total) by mouth daily. Werner Lean, MD Taking Active   famotidine (PEPCID) 40 MG  tablet [id number] Yes as needed. [provider] Taking Active   felodipine (PLENDIL) 10 MG 24 hr tablet [id number] Yes TAKE 1 TABLET BY MOUTH EVERY DAY Mariah Chard, MD  Taking Active   fish oil-omega-3 fatty acids 1000 MG capsule [id number] Yes Take 2 capsules by mouth daily. [provider] Taking Active   Fluocinolone Acetonide Scalp 0.01 % OIL [id number] Yes Apply to affected area of scalp as directed 1-2 x/day as needed for itching Mariah Chard, MD Taking Active   GLUCOSAMINE-CHONDROITIN PO [id number] Yes Take 2 tablets by mouth daily. [provider] Taking Active   L-Methylfolate-Algae-B12-B6 [facility]) 3-90.314-2-35 MG CAPS [id number] Yes Take 1 capsule by mouth 2 (two) times daily.  [provider] Taking Active            Med Note Tamsen Snider   Tue Aug 19, 2018  9:17 AM)    Magnesium 250 MG TABS [id number] Yes Take by mouth daily. Tues, Thursday, Saturday [provider] Taking Active   Menthol, Topical Analgesic, (BIOFREEZE ROLL-ON EX) [id number] Yes Apply 1 application. topically as needed (pain in back,neck,thumb). [provider] Taking Active   metoprolol tartrate (LOPRESSOR) 50 MG tablet [id number] Yes Take 2 hours prior to Cardiac CT Werner Lean, MD Taking Active   nitroGLYCERIN (NITROSTAT) 0.4 MG SL tablet [id number] Yes Place 1 tablet (0.4 mg total) under the tongue every 5 (five) minutes as needed for chest pain. Isaiah Serge, NP Taking Active   nystatin (MYCOSTATIN/NYSTOP) powder [id number] Yes as needed (skin irritation). [provider] Taking Active   Polyvinyl Alcohol-Povidone PF 1.4-0.6 % SOLN [id number] Yes Place 1 drop into both eyes daily as needed (dry eyes).  [provider] Taking Active   RESTASIS 0.05 % ophthalmic emulsion [id number] Yes as needed (dry eyes). [provider] Taking Active   TART [place] [id number] Yes  [provider] Taking Active   valsartan  (DIOVAN) 40 MG tablet [id number] Yes Take 1 tablet (40 mg total) by mouth daily. Mariah Chard, MD Taking Active   Med List Note Annamaria Boots, Kasandra Knudsen, MD 04/12/11 2140): CPAP SMS 7            SDOH:  (Social Determinants of Health) assessments and interventions performed: No SDOH Interventions    Flowsheet Row Office Visit from 06/06/2021 in [facility] Chronic Care Management from 01/03/2021 in [facility] Office Visit from 11/02/2020 in [facility] Clinical Support from 10/28/2019 in [facility] from 10/23/2018 in [facility] Office Visit from 05/15/2018 in [facility]  SDOH Interventions        Food Insecurity Interventions -- Intervention Not Indicated -- -- -- --  Housing Interventions -- Intervention Not Indicated -- -- -- --  Transportation Interventions -- Intervention Not Indicated -- -- -- --  Depression Interventions/Treatment  Patient refuses Treatment -- Patient refuses Treatment PHQ2-9 Score <4 Follow-up Not Indicated PHQ2-9 Score <4 Follow-up Not Indicated Counseling, Medication  [She will c/w therapy as per [facility] group. She admits that she is overwhelmed w/ the care of her Mother's estate, the care of her brother, and she has had two losses in her family. She is agreeable to possibly starting meds]       Medication Assistance: None required.  Patient affirms current coverage meets needs.  Medication Access: Within the past 30 days, how often has patient missed a dose of medication? 30 Is a pillbox or other method used to improve adherence? Yes Factors that may affect medication adherence? nonadherence to medications, adverse effects of medications, and  lack of understanding of disease management Are meds synced by current pharmacy? No  Are meds delivered by  current pharmacy? No  Does patient experience delays in picking up medications due to transportation concerns? Yes   [facility] Reviewed: Is patient disadvantaged to use [facility]?: No  Current Rx insurance plan: [facility] Medicare Name and location of Current pharmacy:  [id number] - [place], [place]. AT [address] [place] [address] Phone: [phone] Fax: [phone]  [facility] services reviewed with patient today?: Yes  Patient requests to transfer care to [facility]?: Yes    Compliance/Adherence/Medication fill history: Care Gaps: COVID-19 Booster   Star-Rating Drugs: Valsartan 40 mg tablet  Atorvastatin 40 mg tablet     Assessment/Plan   Hyperlipidemia: (LDL goal < 70) -Uncontrolled -Current treatment: Zetia 10 mg tablet once per day Appropriate, Query effective Atorvastatin 40 mg tablet Monday through Friday. Appropriate, Query effective  Patient brought her bottle of medication, last filled in October  -Current dietary patterns: breakfast she has a boiled egg, and 1/2 banana. Lunch she went to chicken salad chick and had 1/2 a sandwich and broccoli casserole salad. She is eating plenty of whole fruit and tries to limit the amount of fast food she is eating.  We discussed the in detail the importance of limiting fried, fatty foods and increasing the amount vegetables that she is eating.  -Current exercise habits: she ready to start exercising again, but she wants to wait until she has spoken with her cardiologist again. She really misses walking but since she has moved she does not want to start walking just yet.  -Educated on Benefits of statin for ASCVD risk reduction; Strategies to manage statin-induced myalgias; -She would like to restart her Atorvastatin at night, she is going to write down any thing that she feels different while taking the medication for the next 30  days, and she will call or have her daughters call if it something she finds very important before then  -Collaborated with patient and her daughters, she is going to start taking her Atorvastatin at night with Amlodipine and she is going to take the Valsartan in the morning  and Zetia in the morning.    Hypertension (BP goal <130/80) -Uncontrolled -Current treatment: Valsartan 40 mg tablet daily  Amlodipine 5 mg- taking 2 tablets by mouth daily  New prescription sent for 10 mg tablet once per day  -Current home readings: 160/70, 155/80 -Current dietary habits: she is trying to limit he salt  -Current exercise habits: not currently exercising  -Reports hypotensive/hypertensive symptoms -Educated on BP goals and benefits of medications for prevention of heart attack, stroke and kidney damage; Importance of home blood pressure monitoring; Proper BP monitoring technique; Symptoms of hypotension and importance of maintaining adequate hydration; -Counseled to monitor BP at home at least twice per da, document, and provide log at future appointments -Educated on the importance of taking her medications spaced apart, she reports that taking Amlodipine and the Valsartan together were making her dizzy. She is going to start taking her Amlodipine at night, and she is aware of the importance of taking I daily. She is going to call with any questions.    Health Maintenance -Educated on Herbal supplement research is limited and benefits usually cannot be proven Cost vs benefit of each product must be carefully weighed by individual consumer Supplements may interfere with prescription drugs -Patient is not satisfied with therapy and  will discontinue most of her OTC products once she has run out of them including [employer], and possibly Fish oil for now.  - Congratulated patient on making these changes    [name], CPP, PharmD Clinical Pharmacist Practitioner [facility] [phone]

## 2022-08-23 ENCOUNTER — Ambulatory Visit (HOSPITAL_COMMUNITY): Payer: Medicare PPO | Attending: Internal Medicine

## 2022-08-23 DIAGNOSIS — I5032 Chronic diastolic (congestive) heart failure: Secondary | ICD-10-CM

## 2022-08-23 DIAGNOSIS — I071 Rheumatic tricuspid insufficiency: Secondary | ICD-10-CM | POA: Diagnosis not present

## 2022-08-23 LAB — ECHOCARDIOGRAM COMPLETE
Area-P 1/2: 2.79 cm2
Est EF: >75
S' Lateral: 2.2 cm

## 2022-08-28 ENCOUNTER — Ambulatory Visit: Payer: Self-pay

## 2022-08-29 ENCOUNTER — Encounter: Payer: Self-pay | Admitting: Nurse Practitioner

## 2022-08-29 ENCOUNTER — Inpatient Hospital Stay: Payer: Medicare PPO | Admitting: Nurse Practitioner

## 2022-08-29 ENCOUNTER — Other Ambulatory Visit: Payer: Self-pay

## 2022-08-29 ENCOUNTER — Inpatient Hospital Stay: Payer: Medicare PPO | Attending: Nurse Practitioner

## 2022-08-29 VITALS — BP 162/62 | HR 68 | Temp 97.6°F | Resp 17 | Wt 151.0 lb

## 2022-08-29 DIAGNOSIS — I10 Essential (primary) hypertension: Secondary | ICD-10-CM | POA: Diagnosis not present

## 2022-08-29 DIAGNOSIS — E119 Type 2 diabetes mellitus without complications: Secondary | ICD-10-CM | POA: Insufficient documentation

## 2022-08-29 DIAGNOSIS — M85852 Other specified disorders of bone density and structure, left thigh: Secondary | ICD-10-CM | POA: Insufficient documentation

## 2022-08-29 DIAGNOSIS — C50411 Malignant neoplasm of upper-outer quadrant of right female breast: Secondary | ICD-10-CM

## 2022-08-29 DIAGNOSIS — Z170 Estrogen receptor positive status [ER+]: Secondary | ICD-10-CM

## 2022-08-29 DIAGNOSIS — Z923 Personal history of irradiation: Secondary | ICD-10-CM | POA: Insufficient documentation

## 2022-08-29 DIAGNOSIS — Z79899 Other long term (current) drug therapy: Secondary | ICD-10-CM | POA: Insufficient documentation

## 2022-08-29 DIAGNOSIS — Z79811 Long term (current) use of aromatase inhibitors: Secondary | ICD-10-CM | POA: Diagnosis not present

## 2022-08-29 DIAGNOSIS — Z7982 Long term (current) use of aspirin: Secondary | ICD-10-CM | POA: Insufficient documentation

## 2022-08-29 DIAGNOSIS — Z90710 Acquired absence of both cervix and uterus: Secondary | ICD-10-CM | POA: Diagnosis not present

## 2022-08-29 LAB — CBC WITH DIFFERENTIAL (CANCER CENTER ONLY)
Abs Immature Granulocytes: 0.01 10*3/uL (ref 0.00–0.07)
Basophils Absolute: 0 10*3/uL (ref 0.0–0.1)
Basophils Relative: 1 %
Eosinophils Absolute: 0.1 10*3/uL (ref 0.0–0.5)
Eosinophils Relative: 3 %
HCT: 36.4 % (ref 36.0–46.0)
Hemoglobin: 12.1 g/dL (ref 12.0–15.0)
Immature Granulocytes: 0 %
Lymphocytes Relative: 26 %
Lymphs Abs: 1.1 10*3/uL (ref 0.7–4.0)
MCH: 28.9 pg (ref 26.0–34.0)
MCHC: 33.2 g/dL (ref 30.0–36.0)
MCV: 87.1 fL (ref 80.0–100.0)
Monocytes Absolute: 0.5 10*3/uL (ref 0.1–1.0)
Monocytes Relative: 11 %
Neutro Abs: 2.6 10*3/uL (ref 1.7–7.7)
Neutrophils Relative %: 59 %
Platelet Count: 194 10*3/uL (ref 150–400)
RBC: 4.18 MIL/uL (ref 3.87–5.11)
RDW: 13.6 % (ref 11.5–15.5)
WBC Count: 4.4 10*3/uL (ref 4.0–10.5)
nRBC: 0 % (ref 0.0–0.2)

## 2022-08-29 LAB — CMP (CANCER CENTER ONLY)
ALT: 14 U/L (ref 0–44)
AST: 17 U/L (ref 15–41)
Albumin: 4.4 g/dL (ref 3.5–5.0)
Alkaline Phosphatase: 65 U/L (ref 38–126)
Anion gap: 5 (ref 5–15)
BUN: 12 mg/dL (ref 8–23)
CO2: 31 mmol/L (ref 22–32)
Calcium: 9.8 mg/dL (ref 8.9–10.3)
Chloride: 106 mmol/L (ref 98–111)
Creatinine: 0.88 mg/dL (ref 0.44–1.00)
GFR, Estimated: >60 mL/min (ref >60)
Glucose, Bld: 114 mg/dL — ABNORMAL HIGH (ref 70–99)
Potassium: 3.6 mmol/L (ref 3.5–5.1)
Sodium: 142 mmol/L (ref 135–145)
Total Bilirubin: 0.5 mg/dL (ref 0.3–1.2)
Total Protein: 8 g/dL (ref 6.5–8.1)

## 2022-08-29 NOTE — Progress Notes (Signed)
Patient Care Team: Glendale Chard, MD as PCP - General (Internal Medicine) Werner Lean, MD as PCP - Cardiology (Cardiology) Mauro Kaufmann, RN as Oncology Nurse Navigator Carlynn Spry, Charlott Holler, RN as Oncology Nurse Navigator Excell Seltzer, MD (Inactive) as Consulting Physician (General Surgery) Truitt Merle, MD as Consulting Physician (Hematology) Gery Pray, MD as Consulting Physician (Radiation Oncology) Alla Feeling, NP as Nurse Practitioner (Nurse Practitioner) Warren Danes, PA-C as Physician Assistant (Dermatology) Mayford Knife, [facility] (Pharmacist) Marylynn Pearson, MD as Consulting Physician (Ophthalmology)   CHIEF COMPLAINT: Follow-up right breast cancer  Oncology History Overview Note  Cancer Staging Malignant neoplasm of upper-outer quadrant of right breast in female, estrogen receptor positive [facility]) Staging form: Breast, AJCC 8th Edition - Clinical stage from 08/04/2018: Stage IA (cT1c, cN0, cM0, G2, ER+, PR+, HER2-) - Signed by Truitt Merle, MD on 08/12/2018 - Pathologic stage from 09/02/2018: Stage IA (pT2, pN1a, cM0, G1, ER+, PR+, HER2-) - Signed by Truitt Merle, MD on 09/29/2018     Malignant neoplasm of upper-outer quadrant of right breast in female, estrogen receptor positive ([place])  07/29/2018 Mammogram   Diagnostic Mammgram 07/29/18  IMPRESSION:  The 1.7cm oval mass in the right breast upper outer qudrant middle third, 3-5 from nipple is suspicious of malignancy.    08/04/2018 Cancer Staging   Staging form: Breast, AJCC 8th Edition - Clinical stage from 08/04/2018: Stage IA (cT1c, cN0, cM0, G2, ER+, PR+, HER2-) - Signed by Truitt Merle, MD on 08/12/2018   08/04/2018 Initial Biopsy   Diagnosis 08/04/18  Breast, right, needle core biopsy, 1.7 cm irregular solid mass at 9 o'clock, 4 cm fn - INVASIVE DUCTAL CARCINOMA WITH EXTRACELLULAR MUCIN.   08/04/2018 Receptors her2   Results: IMMUNOHISTOCHEMICAL AND MORPHOMETRIC ANALYSIS PERFORMED MANUALLY The tumor cells  are NEGATIVE for Her2 (0). Estrogen Receptor: 100%, POSITIVE, STRONG STAINING INTENSITY Progesterone Receptor: 100%, POSITIVE, STRONG STAINING INTENSITY Proliferation Marker Ki67: 10%   08/11/2018 Initial Diagnosis   Malignant neoplasm of upper-outer quadrant of right breast in female, estrogen receptor positive ([place])   09/02/2018 Surgery   RIGHT BREAST LUMPECTOMY WITH RADIOACTIVE SEED AND  RIGHT SENTINEL LYMPH NODE BIOPSY by Dr. Excell Seltzer  09/02/18    09/02/2018 Pathology Results   Diagnosis 1. Breast, lumpectomy, Right w/seed - INVASIVE DUCTAL CARCINOMA WITH EXTRACELLULAR MUCIN, GRADE I, 2.1 CM. - DUCTAL CARCINOMA IN SITU, INTERMEDIATE NUCLEAR GRADE. - INVASIVE CARCINOMA IS 2 MM FROM SUPERIOR MARGIN AND 2.5 MM FROM LATERAL MARGIN. - DUCTAL CARCINOMA IN SITU IS LESS THAN 1 MM FROM ANTERIOR MARGIN, 4 MM FROM SUPERIOR AND LATERAL MARGINS, 5 MM FROM INFERIOR MARGIN AND 8 MM FROM THE POSTERIOR MARGIN. - SMALL INTRADUCTAL PAPILLOMA. - NO EVIDENCE OF LYMPHOVASCULAR OR PERINEURAL INVASION. - BIOPSY SITE CHANGES. - SEE ONCOLOGY TABLE. 2. Lymph node, sentinel, biopsy, Right Axillary - LYMPH NODE, NEGATIVE FOR CARCINOMA (0/1). 3. Lymph node, sentinel, biopsy, Right - LYMPH NODE, NEGATIVE FOR CARCINOMA (0/1). 4. Lymph node, sentinel, biopsy, Right - METASTATIC BREAST CARCINOMA TO ONE LYMPH NODE (1/1). - METASTATIC FOCUS MEASURES 0.4 CM IN GREATEST DIMENSION. - NO DEFINITE EVIDENCE OF EXTRANODAL EXTENSION.   09/02/2018 Miscellaneous   MammaPrint  09/02/18 Low risk with 10% average 10-year risk of recurrnce untreated  Mammaprint Index: +0.287 97.8% of patients treated with hormonal therapy have no distnat recurrance.    09/02/2018 Cancer Staging   Staging form: Breast, AJCC 8th Edition - Pathologic stage from 09/02/2018: Stage IA (pT2, pN1a, cM0, G1, ER+, PR+, HER2-) - Signed by Burr Medico,  Krista Blue, MD on 09/29/2018   10/05/2018 - 01/2019 Anti-estrogen oral therapy   Anastrozole 1mg  daily starting 10/05/18;  stopped 5/18 due to insomnia, hot flashes, depression, and vaginal irritation.  I switched her Exemestane in 01/2019. She started having dizziness, hot flashes and joint pain so she stopped two days later.  -She tried for no more than 3 weeks total   11/12/2018 - 12/30/2018 Radiation Therapy   RT 11/12/18-12/30/18 with Dr. Earney Hamburg    05/14/2019 Survivorship   SCP per Cira Rue, NP    06/2019 - 06/2019 Anti-estrogen oral therapy   Tamoxifen 20mg  daily starting 06/2019 but stopped after a few days due to dizziness, nausea and fatigue.       CURRENT THERAPY: Surveillance  INTERVAL HISTORY Ms. Tribe returns for follow-up as scheduled, last seen by me 02/28/2022. She's had cardiac work up in the interim. She continues to have intermittent right breast pain, without new lump/mass, nipple discharge, or inversion. She has chronic pain in most joints including neck, R shoulder, hands/fingers, hips, and knees. Also has to write down more for forgetfulness; she keeps track of her health and her daughter's health info.  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: failed Imuran & Prednisone therapy d/t adverse side effects TLC 106%, DLCO 75% 2009     Past Surgical History:  Procedure Laterality Date   BREAST LUMPECTOMY WITH RADIOACTIVE SEED AND SENTINEL LYMPH NODE BIOPSY Right 09/02/2018   Procedure: RIGHT BREAST LUMPECTOMY WITH RADIOACTIVE SEED AND  RIGHT SENTINEL LYMPH NODE BIOPSY;  Surgeon: Excell Seltzer, MD;  Location: [facility];  Service: General;  Laterality: Right;   BRONCHOSCOPY     dx'd sarcoid   KNEE ARTHROPLASTY Left 03/13/2017   NEUROPLASTY / TRANSPOSITION ULNAR NERVE AT  ELBOW  09/2011   TOTAL ABDOMINAL HYSTERECTOMY       Outpatient Encounter Medications as of 08/29/2022  Medication Sig   amLODipine (NORVASC) 10 MG tablet Take 1 tablet (10 mg total) by mouth daily.   atorvastatin (LIPITOR) 40 MG tablet Take one tablet by mouth on Monday-Friday, skip weekends.   ezetimibe (ZETIA) 10 MG tablet Take 1 tablet (10 mg total) by mouth daily.   Fluocinolone Acetonide Scalp 0.01 % OIL Apply to affected area of scalp as directed 1-2 x/day as needed for itching   Menthol, Topical Analgesic, (BIOFREEZE ROLL-ON EX) Apply 1 application. topically as needed (pain in back,neck,thumb).   nitroGLYCERIN (NITROSTAT) 0.4 MG SL tablet Place 1 tablet (0.4 mg total) under the tongue every 5 (five) minutes as needed for chest pain.   nystatin (MYCOSTATIN/NYSTOP) powder as needed (skin irritation).   Polyvinyl Alcohol-Povidone PF 1.4-0.6 % SOLN Place 1 drop into both eyes daily as needed (dry eyes).    RESTASIS 0.05 % ophthalmic emulsion as needed (dry eyes).   TART CHERRY PO    valsartan (DIOVAN) 40 MG tablet Take 1 tablet (40 mg total) by mouth daily.   aspirin EC 81 MG tablet Take 81 mg by mouth daily. Swallow whole. (Patient not taking: Reported on 08/29/2022)   CALCIUM PO Take by mouth daily. (Patient not taking:  Reported on 08/29/2022)   Cholecalciferol (VITAMIN D-3 PO) Take 1,000 Units by mouth daily. (Patient not taking: Reported on 08/29/2022)   Cyanocobalamin (VITAMIN B-12 PO)  (Patient not taking: Reported on 08/29/2022)   famotidine (PEPCID) 40 MG tablet as needed. (Patient not taking: Reported on 08/29/2022)   fish oil-omega-3 fatty acids 1000 MG capsule Take 2 capsules by mouth daily. (Patient not taking: Reported on 08/29/2022)   GLUCOSAMINE-CHONDROITIN PO Take 2 tablets by mouth daily. (Patient not taking: Reported on 08/29/2022)   L-Methylfolate-Algae-B12-B6 (METANX) 3-90.314-2-35 MG CAPS Take 1 capsule by mouth 2 (two) times daily.  (Patient not taking: Reported on 08/29/2022)    Magnesium 250 MG TABS Take by mouth daily. Tues, Thursday, Saturday (Patient not taking: Reported on 08/29/2022)   metoprolol tartrate (LOPRESSOR) 50 MG tablet Take 2 hours prior to Cardiac CT (Patient not taking: Reported on 08/29/2022)   Facility-Administered Encounter Medications as of 08/29/2022  Medication   0.9 %  sodium chloride infusion     Today's Vitals   08/29/22 1213 08/29/22 1236  BP: (!) 162/62   Pulse: 68   Resp: 17   Temp: 97.6 F (36.4 C)   TempSrc: Oral   SpO2: 99%   Weight: 151 lb (68.5 kg)   PainSc:  0-No pain   Body mass index is 30.5 kg/m.   PHYSICAL EXAM GENERAL:alert, no distress and comfortable SKIN: no rash  EYES: sclera clear NECK: without mass LYMPH:  no palpable cervical or supraclavicular lymphadenopathy  LUNGS: clear with normal breathing effort HEART: regular rate & rhythm, no lower extremity edema ABDOMEN: abdomen soft, non-tender and normal bowel sounds NEURO: alert & oriented x 3 with fluent speech, no focal motor/sensory deficits Breast exam: Symmetric without nipple discharge or inversion.  S/p right lumpectomy, incisions completely healed.  No palpable mass or nodularity in either breast or axilla that I could appreciate.   CBC    Component Value Date/Time   WBC 4.4 08/29/2022 1157   WBC 4.1 07/24/2022 1352   RBC 4.18 08/29/2022 1157   HGB 12.1 08/29/2022 1157   HGB 11.8 01/31/2022 1051   HCT 36.4 08/29/2022 1157   HCT 36.9 01/31/2022 1051   PLT 194 08/29/2022 1157   PLT 189 01/31/2022 1051   MCV 87.1 08/29/2022 1157   MCV 88 01/31/2022 1051   MCH 28.9 08/29/2022 1157   MCHC 33.2 08/29/2022 1157   RDW 13.6 08/29/2022 1157   RDW 13.7 01/31/2022 1051   LYMPHSABS 1.1 08/29/2022 1157   MONOABS 0.5 08/29/2022 1157   EOSABS 0.1 08/29/2022 1157   BASOSABS 0.0 08/29/2022 1157     CMP     Component Value Date/Time   NA 142 08/29/2022 1157   NA 141 08/21/2022 1456   K 3.6 08/29/2022 1157   CL 106 08/29/2022 1157   CO2 31  08/29/2022 1157   GLUCOSE 114 (H) 08/29/2022 1157   BUN 12 08/29/2022 1157   BUN 14 08/21/2022 1456   CREATININE 0.88 08/29/2022 1157   CALCIUM 9.8 08/29/2022 1157   PROT 8.0 08/29/2022 1157   PROT 7.6 01/31/2022 1051   ALBUMIN 4.4 08/29/2022 1157   ALBUMIN 4.5 01/31/2022 1051   AST 17 08/29/2022 1157   ALT 14 08/29/2022 1157   ALKPHOS 65 08/29/2022 1157   BILITOT 0.5 08/29/2022 1157   GFRNONAA >60 08/29/2022 1157   GFRAA 85 05/26/2020 1232   GFRAA >60 08/12/2019 1035     ASSESSMENT & PLAN: 77 year old female     1. Malignant  neoplasm of upper-outer quadrant of right breast, Stage IA, pT2N1aM0, ER/PR+, HER2-, Grade I, Mammaprint low risk  -Diagnosed in 08/2018. S/p right lumpectomy with SLNB on 09/02/18 adjuvant Radiation therapy.  -Mammaprint showed low risk, Adjuvant chemo was not recommended.  -Given her ER/PR+ disease, she tried antiestrogen therapy with Anastrozole, Exemestane and Tamoxifen over 3-4 months total. Given poor tolerance, she stopped and proceeded with surveillance as of 06/2019.  -Last mammogram 10/2021 was benign -Ms. Cumplido is clinically doing well from a breast cancer standpoint.  Breast exam is benign, labs are unremarkable.  Overall no clinical concern for recurrence. -She is 4 years from definitive treatment, recurrence risk has decreased.  Continue surveillance -Next mammogram 10/2022, she will call [facility] -Follow-up with me in 1 year, or sooner if needed   2. Osteopenia  -12/2018 DEXA shows osteopenia with lowest T-score -1.3 at left femur neck. She has a estimated fracture risk is 4.5% in the next 10 and 0.7% risk of hip fracture in the next 10 years.  -DEXA 08/14/2021 shows worsening BMD in all sites, and increased fracture risk in the hip up to 5.4% -I previously recommended bisphosphonate, she declined  -Continue calcium, vitamin D, and weightbearing exercise   3. Cancer Screening, Genetic Testing has not been perused yet, pt will think about it.  -Her  last colonoscopy was in 2018 and benign adenomas were removed, repeat 5-10 years (Dr. Collene Mares) -Continue age-appropriate health maintenance and healthy lifestyle   4. HTN and DM -per PCP     PLAN: -Labs reviewed -Continue breast cancer surveillance -Mammogram in 10/2022 at [facility], ordered -Follow-up with me in 1 year, or sooner if needed   Orders Placed This Encounter  Procedures   MM 3D SCREENING MAMMOGRAM BILATERAL BREAST    Standing Status:   Future    Standing Expiration Date:   08/29/2023    Scheduling Instructions:     [facility]    Order Specific Question:   Reason for Exam (SYMPTOM  OR DIAGNOSIS REQUIRED)    Answer:   h/o R breast cancer s/p lumpectomy 08/2018    Order Specific Question:   Preferred imaging location?    Answer:   External      All questions were answered. The patient knows to call the clinic with any problems, questions or concerns. No barriers to learning were detected. I spent 20 minutes counseling the patient face to face. The total time spent in the appointment was 30 minutes and more than 50% was on counseling, review of test results, and coordination of care.   Cira Rue, NP-C 08/29/2022

## 2022-08-30 ENCOUNTER — Ambulatory Visit: Payer: Medicare PPO

## 2022-08-30 VITALS — BP 132/78 | HR 67 | Temp 98.1°F | Ht 59.0 in | Wt 151.0 lb

## 2022-08-30 DIAGNOSIS — L603 Nail dystrophy: Secondary | ICD-10-CM | POA: Diagnosis not present

## 2022-08-30 DIAGNOSIS — I10 Essential (primary) hypertension: Secondary | ICD-10-CM

## 2022-08-30 DIAGNOSIS — G5762 Lesion of plantar nerve, left lower limb: Secondary | ICD-10-CM | POA: Diagnosis not present

## 2022-08-30 DIAGNOSIS — B351 Tinea unguium: Secondary | ICD-10-CM | POA: Diagnosis not present

## 2022-08-30 DIAGNOSIS — M792 Neuralgia and neuritis, unspecified: Secondary | ICD-10-CM | POA: Diagnosis not present

## 2022-08-30 DIAGNOSIS — M722 Plantar fascial fibromatosis: Secondary | ICD-10-CM | POA: Diagnosis not present

## 2022-08-30 DIAGNOSIS — M19079 Primary osteoarthritis, unspecified ankle and foot: Secondary | ICD-10-CM | POA: Diagnosis not present

## 2022-08-30 DIAGNOSIS — E1142 Type 2 diabetes mellitus with diabetic polyneuropathy: Secondary | ICD-10-CM | POA: Diagnosis not present

## 2022-08-30 DIAGNOSIS — I739 Peripheral vascular disease, unspecified: Secondary | ICD-10-CM | POA: Diagnosis not present

## 2022-08-30 NOTE — Progress Notes (Signed)
Patient presents today for bpc. She currently takes Amlodipine 10MG . She reports taking in the morning. Along with Valsartan 40MG  taken in the morning she reports. She admits taking medicine before appointment today. On days she does not have an apt she reports taking the medications around 11-12. Denies headache, chest pain, SOB. She reports no exercise. Except for walking around the house. Per provider. Patient is to take Amlodipine 10MG  at night & Valsartan 40MG  in the morning. Patient notified, patient aware. She will then follow up in 4 weeks with provider.

## 2022-08-30 NOTE — Patient Instructions (Signed)
Hypertension, Adult ?Hypertension is another name for high blood pressure. High blood pressure forces your heart to work harder to pump blood. This can cause problems over time. ?There are two numbers in a blood pressure reading. There is a top number (systolic) over a bottom number (diastolic). It is best to have a blood pressure that is below 120/80. ?What are the causes? ?The cause of this condition is not known. Some other conditions can lead to high blood pressure. ?What increases the risk? ?Some lifestyle factors can make you more likely to develop high blood pressure: ?Smoking. ?Not getting enough exercise or physical activity. ?Being overweight. ?Having too much fat, sugar, calories, or salt (sodium) in your diet. ?Drinking too much alcohol. ?Other risk factors include: ?Having any of these conditions: ?Heart disease. ?Diabetes. ?High cholesterol. ?Kidney disease. ?Obstructive sleep apnea. ?Having a family history of high blood pressure and high cholesterol. ?Age. The risk increases with age. ?Stress. ?What are the signs or symptoms? ?High blood pressure may not cause symptoms. Very high blood pressure (hypertensive crisis) may cause: ?Headache. ?Fast or uneven heartbeats (palpitations). ?Shortness of breath. ?Nosebleed. ?Vomiting or feeling like you may vomit (nauseous). ?Changes in how you see. ?Very bad chest pain. ?Feeling dizzy. ?Seizures. ?How is this treated? ?This condition is treated by making healthy lifestyle changes, such as: ?Eating healthy foods. ?Exercising more. ?Drinking less alcohol. ?Your doctor may prescribe medicine if lifestyle changes do not help enough and if: ?Your top number is above 130. ?Your bottom number is above 80. ?Your personal target blood pressure may vary. ?Follow these instructions at home: ?Eating and drinking ? ?If told, follow the DASH eating plan. To follow this plan: ?Fill one half of your plate at each meal with fruits and vegetables. ?Fill one fourth of your plate  at each meal with whole grains. Whole grains include whole-wheat pasta, brown rice, and whole-grain bread. ?Eat or drink low-fat dairy products, such as skim milk or low-fat yogurt. ?Fill one fourth of your plate at each meal with low-fat (lean) proteins. Low-fat proteins include fish, chicken without skin, eggs, beans, and tofu. ?Avoid fatty meat, cured and processed meat, or chicken with skin. ?Avoid pre-made or processed food. ?Limit the amount of salt in your diet to less than 1,500 mg each day. ?Do not drink alcohol if: ?Your doctor tells you not to drink. ?You are pregnant, may be pregnant, or are planning to become pregnant. ?If you drink alcohol: ?Limit how much you have to: ?0-1 drink a day for women. ?0-2 drinks a day for men. ?Know how much alcohol is in your drink. In the [place] one drink equals one 12 oz bottle of beer (355 mL), one 5 oz glass of wine (148 mL), or one 1? oz glass of hard liquor (44 mL). ?Lifestyle ? ?Work with your doctor to stay at a healthy weight or to lose weight. Ask your doctor what the best weight is for you. ?Get at least 30 minutes of exercise that causes your heart to beat faster (aerobic exercise) most days of the week. This may include walking, swimming, or biking. ?Get at least 30 minutes of exercise that strengthens your muscles (resistance exercise) at least 3 days a week. This may include lifting weights or doing Pilates. ?Do not smoke or use any products that contain nicotine or tobacco. If you need help quitting, ask your doctor. ?Check your blood pressure at home as told by your doctor. ?Keep all follow-up visits. ?Medicines ?Take over-the-counter and prescription medicines   only as told by your doctor. Follow directions carefully. ?Do not skip doses of blood pressure medicine. The medicine does not work as well if you skip doses. Skipping doses also puts you at risk for problems. ?Ask your doctor about side effects or reactions to medicines that you should watch  for. ?Contact a doctor if: ?You think you are having a reaction to the medicine you are taking. ?You have headaches that keep coming back. ?You feel dizzy. ?You have swelling in your ankles. ?You have trouble with your vision. ?Get help right away if: ?You get a very bad headache. ?You start to feel mixed up (confused). ?You feel weak or numb. ?You feel faint. ?You have very bad pain in your: ?Chest. ?Belly (abdomen). ?You vomit more than once. ?You have trouble breathing. ?These symptoms may be an emergency. Get help right away. Call 911. ?Do not wait to see if the symptoms will go away. ?Do not drive yourself to the hospital. ?Summary ?Hypertension is another name for high blood pressure. ?High blood pressure forces your heart to work harder to pump blood. ?For most people, a normal blood pressure is less than 120/80. ?Making healthy choices can help lower blood pressure. If your blood pressure does not get lower with healthy choices, you may need to take medicine. ?This information is not intended to replace advice given to you by your health care provider. Make sure you discuss any questions you have with your health care provider. ?Document Revised: 03/09/2021 Document Reviewed: 03/09/2021 ?Elsevier Patient Education ? 2023 Else[employer] Inc. ? ?

## 2022-09-04 NOTE — Progress Notes (Unsigned)
Patient ID: Mariah Burns                 DOB: July 27, 1945                      MRN: [id number]      HPI: Mariah Burns is a 78 y.o. female referred by Dr. Gasper Sells to HTN clinic. PMH is significant for hypertension, pericardial effusion, OSA, 123456, chronic diastolic heart failure, obesity.   Patient presented today for hypertension clinic. Reports she checks BP at home and home monitor has been validated at Dr.Sander's office on 08/06/2022. Reports she has 2 home BP monitors. Her home BP ~148-160/68-70 range. She is unsure about the dose of amlodipine whether she is taking 1 tablet of 10 mg or 5 mg or 2 tablets of 5 mg. She takes her medications regularly. Felodipine was d/c due to dizziness and switched to amlodipine. She does not do exercise due to chest discomforts and was advised not to do strenuous activity . She eats out once or twice week. Does not add salt to home cooked meals.    Current HTN meds: amlodipine 10 mg daily,valsartan 40 mg daily  Previously tried: felodipine 10 mg -dizziness  BP goal: <130/80  Family History:  Mom: heart problems, hypertension, T2DM Dad: died from work place accident  Siblings- sister and brother- T2DM, HTN  2 younger brothers passed away from drug over doses    Social History:  Alcohol: none  Smoking: never   Diet:  watches salt intake  Eats out once or twice week  Home - does not cook with salt Breakfast: boiled eggs, banana muffins, watermelon, melon, yogurts Lunch: skips most days  Dinner usually by 4 pm: chicken casserole with broccoli, vegetables, grilled or baked meat and one grain choice Snack- fruit, goldfishes crackers,salty snacks (chips)  apple juice - low sodium and low sugar, peanut butter crackers    Exercise: none  Was going to Gym regularly, stopped going due to chest discomfort    Home BP readings: 148-160/68-70    Wt Readings from Last 3 Encounters:  08/30/22 151 lb (68.5 kg)  08/29/22 151 lb (68.5 kg)   08/21/22 147 lb (66.7 kg)   BP Readings from Last 3 Encounters:  09/05/22 127/68  08/30/22 132/78  08/29/22 (!) 162/62   Pulse Readings from Last 3 Encounters:  09/05/22 65  08/30/22 67  08/29/22 68    Renal function: Estimated Creatinine Clearance: 45 mL/min (by C-G formula based on SCr of 0.88 mg/dL).  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 90 tablet 1   aspirin EC 81 MG tablet Take 81 mg by mouth daily. Swallow whole. (Patient not taking: Reported on 08/29/2022)     atorvastatin (LIPITOR) 40 MG tablet Take one tablet by mouth on Monday-Friday, skip weekends. 30 tablet 3   CALCIUM PO Take by mouth  daily. (Patient not taking: Reported on 08/29/2022)     Cholecalciferol (VITAMIN D-3 PO) Take 1,000 Units by mouth daily. (Patient not taking: Reported on 08/29/2022)     Cyanocobalamin (VITAMIN B-12 PO)  (Patient not taking: Reported on 08/29/2022)     ezetimibe (ZETIA) 10 MG tablet Take 1 tablet (10 mg total) by mouth daily. 30 tablet 11   famotidine (PEPCID) 40 MG tablet as needed. (Patient not taking: Reported on 08/29/2022)     fish oil-omega-3 fatty acids 1000 MG capsule Take 2 capsules by mouth daily. (Patient not taking: Reported on 08/29/2022)     Fluocinolone Acetonide Scalp 0.01 % OIL Apply to affected area of scalp as directed 1-2 x/day as needed for itching 118.28 mL 1   GLUCOSAMINE-CHONDROITIN PO Take 2 tablets by mouth daily. (Patient not taking: Reported on 08/29/2022)      L-Methylfolate-Algae-B12-B6 (METANX) 3-90.314-2-35 MG CAPS Take 1 capsule by mouth 2 (two) times daily.  (Patient not taking: Reported on 08/29/2022)     Magnesium 250 MG TABS Take by mouth daily. Tues, Thursday, Saturday (Patient not taking: Reported on 08/29/2022)     Menthol, Topical Analgesic, (BIOFREEZE ROLL-ON EX) Apply 1 application. topically as needed (pain in back,neck,thumb).     metoprolol tartrate (LOPRESSOR) 50 MG tablet Take 2 hours prior to Cardiac CT (Patient not taking: Reported on 08/29/2022) 1 tablet 0   nitroGLYCERIN (NITROSTAT) 0.4 MG SL tablet Place 1 tablet (0.4 mg total) under the tongue every 5 (five) minutes as needed for chest pain. 25 tablet 2   nystatin (MYCOSTATIN/NYSTOP) powder as needed (skin irritation).     Polyvinyl Alcohol-Povidone PF 1.4-0.6 % SOLN Place 1 drop into both eyes daily as needed (dry eyes).      RESTASIS 0.05 % ophthalmic emulsion as needed (dry eyes).     TART CHERRY PO      valsartan (DIOVAN) 40 MG tablet Take 1 tablet (40 mg total) by mouth daily. 90 tablet 1   Current Facility-Administered Medications on File Prior to Visit  Medication Dose Route Frequency Provider Last Rate Last Admin   0.9 %  sodium chloride infusion   Intravenous PRN Minette Brine, FNP        Allergies  Allergen Reactions   Fosamax [Alendronate Sodium] Other (See Comments)    REACTION: "CAUSE GI UPSET AND FATIGUE"   Omeprazole Other (See Comments)    Lower ab pain   Motrin [Ibuprofen] Other (See Comments)    GI upset- has to eat prior to taking   Fluvastatin Other (See Comments)    mild memoryproblems of confusion     Blood pressure 127/68, pulse 65, SpO2 98 %.   Essential hypertension Assessment: BP is controlled in office BP 127/68 with heart rate 65  mmHg above the (goal <130/80) Home BP ~ 148-160/68-70  Unsure what dose of amlodipine patient currently taking however takes her medications regularly and tolerates them well without any side effects Denies  SOB, palpitation, chest pain, headaches,or swelling Reiterated the importance of regular exercise and low salt diet   Plan:  Given the controlled BP in the office not making any changes to the BP medications  Continue taking amlodipine and valsartan 40 mg daily Patient to call back to inform us dose of amlodipine she is  taking  Patient to bring BP monitor at next office visit for validation  Patient to keep record of BP readings with heart rate and report to [place] at the next visit Patient to see PharmD in  4 weeks for follow up  Follow up lab(s):none   Chronic diastolic heart failure (HCC) Assessment and plan:  Patient currently on valsartan 40 mg  We discussed GDMT for HFpEF - risk vs. Benefits  Discussed SGLT2i in detail; patient will think and let us know at the next OV in 4 weeks       Thank you  Cammy Copa, Pharm.D [facility] of [facility] [address] [place], [place] [address]  Phone: [phone]; Fax: [phone]

## 2022-09-05 ENCOUNTER — Ambulatory Visit: Payer: Medicare PPO | Attending: Cardiology | Admitting: Student

## 2022-09-05 VITALS — BP 127/68 | HR 65

## 2022-09-05 DIAGNOSIS — I5032 Chronic diastolic (congestive) heart failure: Secondary | ICD-10-CM | POA: Diagnosis not present

## 2022-09-05 DIAGNOSIS — I10 Essential (primary) hypertension: Secondary | ICD-10-CM | POA: Diagnosis not present

## 2022-09-05 DIAGNOSIS — G4733 Obstructive sleep apnea (adult) (pediatric): Secondary | ICD-10-CM | POA: Diagnosis not present

## 2022-09-05 NOTE — Patient Instructions (Signed)
No changes made by your pharmacist Nikea Settle, PharmD at today's visit:     Bring all of your meds, your BP cuff and your record of home blood pressures to your next appointment.    HOW TO TAKE YOUR BLOOD PRESSURE AT HOME  Rest 5 minutes before taking your blood pressure.  Don't smoke or drink caffeinated beverages for at least 30 minutes before. Take your blood pressure before (not after) you eat. Sit comfortably with your back supported and both feet on the floor (don't cross your legs). Elevate your arm to heart level on a table or a desk. Use the proper sized cuff. It should fit smoothly and snugly around your bare upper arm. There should be enough room to slip a fingertip under the cuff. The bottom edge of the cuff should be 1 inch above the crease of the elbow. Ideally, take 3 measurements at one sitting and record the average.  Important lifestyle changes to control high blood pressure  Intervention  Effect on the BP  Lose extra pounds and watch your waistline Weight loss is one of the most effective lifestyle changes for controlling blood pressure. If you're overweight or obese, losing even a small amount of weight can help reduce blood pressure. Blood pressure might go down by about 1 millimeter of mercury (mm Hg) with each kilogram (about 2.2 pounds) of weight lost.  Exercise regularly As a general goal, aim for at least 30 minutes of moderate physical activity every day. Regular physical activity can lower high blood pressure by about 5 to 8 mm Hg.  Eat a healthy diet Eating a diet rich in whole grains, fruits, vegetables, and low-fat dairy products and low in saturated fat and cholesterol. A healthy diet can lower high blood pressure by up to 11 mm Hg.  Reduce salt (sodium) in your diet Even a small reduction of sodium in the diet can improve heart health and reduce high blood pressure by about 5 to 6 mm Hg.  Limit alcohol One drink equals 12 ounces of beer, 5 ounces of  wine, or 1.5 ounces of 80-proof liquor.  Limiting alcohol to less than one drink a day for women or two drinks a day for men can help lower blood pressure by about 4 mm Hg.   If you have any questions or concerns please use My Chart to send questions or call the office at [phone]  

## 2022-09-05 NOTE — Assessment & Plan Note (Signed)
Assessment and plan:  Patient currently on valsartan 40 mg  We discussed GDMT for HFpEF - risk vs. Benefits  Discussed SGLT2i in detail; patient will think and let us know at the next OV in 4 weeks

## 2022-09-05 NOTE — Assessment & Plan Note (Signed)
Assessment: BP is controlled in office BP 127/68 with heart rate 65  mmHg above the (goal <130/80) Home BP ~ 148-160/68-70  Unsure what dose of amlodipine patient currently taking however takes her medications regularly and tolerates them well without any side effects Denies SOB, palpitation, chest pain, headaches,or swelling Reiterated the importance of regular exercise and low salt diet   Plan:  Given the controlled BP in the office not making any changes to the BP medications  Continue taking amlodipine and valsartan 40 mg daily Patient to call back to inform us dose of amlodipine she is  taking  Patient to bring BP monitor at next office visit for validation  Patient to keep record of BP readings with heart rate and report to [place] at the next visit Patient to see PharmD in 4 weeks for follow up  Follow up lab(s):none

## 2022-09-06 ENCOUNTER — Telehealth: Payer: Self-pay | Admitting: Pharmacist

## 2022-09-06 NOTE — Telephone Encounter (Signed)
Call to verify amlodipine dose. Has been taking amlodipine 10 mg (2 tabs of 5 mg).  Patient had a question about exercise at yesterdays' visit  Consulted  Dr.Chandrasekhar,  Question: Patient had stopped going to fitness center due to chest discomfort during exercise( walking, water aerobics) and was advised not to do any exercise she wondering if she can restart her exercise. Please advice.   Response from Dr.Chandrasekhar "Had recent Cardiac CT with only mild disease. Can do PET MPI study to rule our microvascular disease. If negative, needs PCP eval for non cardiac CP"   Patient informed, verbalizes understanding, will discuss it further with Dr.Chandrasekhar at the next visit in June, 2024.

## 2022-09-17 ENCOUNTER — Encounter: Payer: Self-pay | Admitting: Internal Medicine

## 2022-09-17 ENCOUNTER — Telehealth: Payer: Self-pay

## 2022-09-17 NOTE — Progress Notes (Signed)
Care Management & Coordination Services Pharmacy Team  Reason for Encounter: Appointment Reminder  Contacted patient to confirm telephone appointment with Cherylin Mylar, PharmD on 09-19-2022 at 10:00. Spoke with patient on 09/17/2022   Do you have any problems getting your medications? No  What is your top health concern you would like to discuss at your upcoming visit? Patient stated no  Have you seen any other providers since your last visit with PCP? Yes   Chart review: Recent office visits:  08-30-2022 Randa Lynn T, [place]. BP check 132/78  Recent consult visits:  09-05-2022 Carmela Hurt (Cardiology). Follow up visit no changes  08-29-2022 Pollyann Samples, NP (Oncology). Orders placed for mammogram.  Hospital visits:  Medication Reconciliation was completed by comparing discharge summary, patient's EMR and Pharmacy list, and upon discussion with patient.  Admitted to the hospital on 07-24-2022 due to chest pain. Discharge date was 07-24-2022. Discharged from [facility] at [facility].    New?Medications Started at [facility] Discharge:?? None  Medication Changes at Hospital Discharge: None  Medications Discontinued at Hospital Discharge: None  Medications that remain the same after Hospital Discharge:??  -All other medications will remain the same.     Star Rating Drugs:  Atorvastatin 40 mg- Last filled [facility]-23-2022 [facility] Previous 04-22-2022 90 DS Valsartan 40 mg- last filled 08-14-2022 90 DS. No previous fills  Care Gaps: Annual wellness visit in last year? Yes Covid booster overdue A1C overdue  If Diabetic: Last eye exam / retinopathy screening: 09-2021 next appointment 09-18-2022 Last diabetic foot exam: 06-01-2022   Huey Romans [facility] Clinical Pharmacist Assistant [phone]

## 2022-09-17 NOTE — Telephone Encounter (Signed)
-----   Message from Christell Constant, MD sent at 09/05/2022  4:45 PM EDT ----- Had recent Cardiac CT with only mild disease. Can do PET MPI study to rule our microvascular disease.  If negative, needs PCP eval for non cardiac CP  ----- Message ----- From: Tylene Fantasia, [facility] Sent: 09/05/2022   3:29 PM EDT To: Regan Lemming, MD; #  To Dr. Izora Ribas,  Patient had stopped going to fitness center due to chest discomfort during exercise( walking, water aerobics) and was advised not to do any exercise she wondering if she can restart her exercise. Please advice.

## 2022-09-17 NOTE — Telephone Encounter (Signed)
Left a message to call back.

## 2022-09-19 ENCOUNTER — Ambulatory Visit: Payer: Medicare PPO

## 2022-09-19 NOTE — Progress Notes (Signed)
Care Management & Coordination Services Pharmacy Note  09/19/2022 Name:  Mariah Burns MRN:  [id number] DOB:  [birth year]  Summary: Patient reports going to [facility]  Recommendations/Changes made from today's visit: Recommend patient continue to take her medication daily.  Patient to start taking Amlodipine 10 mg tablet at bedtime, once she has completed her bottle of Amlodipine 5 mg tablet taking two tablets at bedtime   Follow up plan: Patient is going to start checking BP twice a day, first thing in the morning prior to eating, and in the evening right before bed for the next 4 weeks  Onboarding patient to the pharmacy, preferred times of call 10 AM or   after 4 pm.  HC to set up appointment to discuss onboarding patient to pharmcy    Subjective: Mariah Burns is an 77 y.o. year old female who is a primary patient of Dorothyann Peng, MD.  The care coordination team was consulted for assistance with disease management and care coordination needs.    Engaged with patient by telephone for follow up visit.  Recent office visits:  08-30-2022 Coolidge Breeze, CMA. BP check 132/78   Recent consult visits:  09-05-2022 Carmela Hurt (Cardiology). Follow up visit no changes   08-29-2022 Pollyann Samples, NP (Oncology). Orders placed for mammogram.   Hospital visits:  Medication Reconciliation was completed by comparing discharge summary, patient's EMR and Pharmacy list, and upon discussion with patient.   Admitted to the hospital on 07-24-2022 due to chest pain. Discharge date was 07-24-2022. Discharged from [facility] at [facility].     New?Medications Started at [facility] Discharge:?? None   Medication Changes at Hospital Discharge: None   Medications Discontinued at Hospital Discharge: None   Medications that remain the same after Hospital Discharge:??  -All other medications will remain the same.     Objective:  Lab Results  Component  Value Date   CREATININE 0.88 08/29/2022   BUN 12 08/29/2022   EGFR 72 08/21/2022   GFRNONAA >60 08/29/2022   GFRAA 85 05/26/2020   NA 142 08/29/2022   K 3.6 08/29/2022   CALCIUM 9.8 08/29/2022   CO2 31 08/29/2022   GLUCOSE 114 (H) 08/29/2022    Lab Results  Component Value Date/Time   HGBA1C 5.9 (H) 03/13/2022 02:02 PM   HGBA1C 6.0 (H) 01/31/2022 10:51 AM   MICROALBUR 30 02/23/2021 05:06 PM   MICROALBUR 30 02/23/2020 01:26 PM    Last diabetic Eye exam:  Lab Results  Component Value Date/Time   HMDIABEYEEXA No Retinopathy 10/31/2021 12:00 AM    Last diabetic Foot exam: No results found for: "HMDIABFOOTEX"   Lab Results  Component Value Date   CHOL 176 04/24/2022   HDL 61 04/24/2022   LDLCALC 96 04/24/2022   TRIG 103 04/24/2022   CHOLHDL 2.9 04/24/2022       Latest Ref Rng & Units 08/29/2022   11:57 AM 02/28/2022   12:14 PM 01/31/2022   10:51 AM  Hepatic Function  Total Protein 6.5 - 8.1 g/dL 8.0  7.9  7.6   Albumin 3.5 - 5.0 g/dL 4.4  4.3  4.5   AST 15 - 41 U/L 17  21  20    ALT 0 - 44 U/L 14  16  15    Alk Phosphatase 38 - 126 U/L 65  65  76   Total Bilirubin 0.3 - 1.2 mg/dL 0.5  0.4  0.5     Lab Results  Component Value Date/Time  TSH 1.170 11/02/2020 10:36 AM   TSH 1.240 06/11/2019 02:24 PM       Latest Ref Rng & Units 08/29/2022   11:57 AM 07/24/2022    1:52 PM 02/28/2022   12:14 PM  CBC  WBC 4.0 - 10.5 K/uL 4.4  4.1  4.1   Hemoglobin 12.0 - 15.0 g/dL 16.1  09.6  04.5   Hematocrit 36.0 - 46.0 % 36.4  39.4  36.4   Platelets 150 - 400 K/uL 194  172  190     Lab Results  Component Value Date/Time   VD25OH 43.0 10/28/2019 11:37 AM   VITAMINB12 >2000 (H) 02/23/2021 04:11 PM   VITAMINB12 >2000 (H) 11/02/2020 10:36 AM    Clinical ASCVD: No  The ASCVD Risk score (Arnett DK, et al., 2019) failed to calculate for the following reasons:   Unable to determine if patient is Non-Hispanic African American        01/31/2022   10:08 AM 12/06/2021    9:48 AM  10/05/2021   11:58 AM  Depression screen PHQ 2/9  Decreased Interest 0 0 0  Down, Depressed, Hopeless 0 0 0  PHQ - 2 Score 0 0 0  Altered sleeping 0  0  Tired, decreased energy 0  0  Change in appetite 0  0  Feeling bad or failure about yourself  0  0  Trouble concentrating 0  0  Moving slowly or fidgety/restless 0  0  Suicidal thoughts 0  0  PHQ-9 Score 0  0  Difficult doing work/chores Not difficult at all       Social History   Tobacco Use  Smoking Status Never   Passive exposure: Never  Smokeless Tobacco Never   BP Readings from Last 3 Encounters:  09/05/22 127/68  08/30/22 132/78  08/29/22 (!) 162/62   Pulse Readings from Last 3 Encounters:  09/05/22 65  08/30/22 67  08/29/22 68   Wt Readings from Last 3 Encounters:  08/30/22 151 lb (68.5 kg)  08/29/22 151 lb (68.5 kg)  08/21/22 147 lb (66.7 kg)   BMI Readings from Last 3 Encounters:  08/30/22 30.50 kg/m  08/29/22 30.50 kg/m  08/21/22 29.69 kg/m    Allergies  Allergen Reactions   Fosamax [Alendronate Sodium] Other (See Comments)    REACTION: "CAUSE GI UPSET AND FATIGUE"   Omeprazole Other (See Comments)    Lower ab pain   Motrin [Ibuprofen] Other (See Comments)    GI upset- has to eat prior to taking   Fluvastatin Other (See Comments)    mild memoryproblems of confusion     Medications Reviewed Today     Reviewed by Tylene Fantasia, [facility] (Pharmacist) on 09/05/22 at 1523  Med List Status: <None>   Medication Order Taking? Sig Documenting Provider Last Dose Status Informant  0.9 %  sodium chloride infusion [id number]   Arnette Felts, FNP  Active   amLODipine (NORVASC) 10 MG tablet [id number] No Take 1 tablet (10 mg total) by mouth daily. Dorothyann Peng, MD Taking Active   aspirin EC 81 MG tablet [id number] No Take 81 mg by mouth daily. Swallow whole.  Patient not taking: Reported on 08/29/2022   [provider] Not Taking Active   atorvastatin (LIPITOR) 40 MG tablet [id number] No Take one  tablet by mouth on Monday-Friday, skip weekends. Dorothyann Peng, MD Taking Active   CALCIUM PO [id number] No Take by mouth daily.  Patient not taking: Reported on 08/29/2022   [provider] Not Taking  Active   Cholecalciferol (VITAMIN D-3 PO) [id number] No Take 1,000 Units by mouth daily.  Patient not taking: Reported on 08/29/2022   [provider] Not Taking Active   Cyanocobalamin (VITAMIN B-12 PO) [id number] No   Patient not taking: Reported on 08/29/2022   [provider] Not Taking Active   ezetimibe (ZETIA) 10 MG tablet [id number] No Take 1 tablet (10 mg total) by mouth daily. Christell Constant, MD Taking Active   famotidine (PEPCID) 40 MG tablet [id number] No as needed.  Patient not taking: Reported on 08/29/2022   [provider] Not Taking Active   fish oil-omega-3 fatty acids 1000 MG capsule [id number] No Take 2 capsules by mouth daily.  Patient not taking: Reported on 08/29/2022   [provider] Not Taking Active   Fluocinolone Acetonide Scalp 0.01 % OIL [id number] No Apply to affected area of scalp as directed 1-2 x/day as needed for itching Dorothyann Peng, MD Taking Active   GLUCOSAMINE-CHONDROITIN PO [id number] No Take 2 tablets by mouth daily.  Patient not taking: Reported on 08/29/2022   [provider] Not Taking Active   L-Methylfolate-Algae-B12-B6 [facility]) 3-90.314-2-35 MG CAPS [id number] No Take 1 capsule by mouth 2 (two) times daily.   Patient not taking: Reported on 08/29/2022   [provider] Not Taking Active            Med Note Debbe Bales   Tue Aug 19, 2018  9:17 AM)    Magnesium 250 MG TABS [id number] No Take by mouth daily. Tues, Thursday, Saturday  Patient not taking: Reported on 08/29/2022   [provider] Not Taking Active   Menthol, Topical Analgesic, (BIOFREEZE ROLL-ON EX) [id number] No Apply 1 application. topically as needed (pain in back,neck,thumb). [provider]  Taking Active   metoprolol tartrate (LOPRESSOR) 50 MG tablet [id number] No Take 2 hours prior to Cardiac CT  Patient not taking: Reported on 08/29/2022   Christell Constant, MD Not Taking Active   nitroGLYCERIN (NITROSTAT) 0.4 MG SL tablet [id number] No Place 1 tablet (0.4 mg total) under the tongue every 5 (five) minutes as needed for chest pain. Leone Brand, NP Taking Active   nystatin (MYCOSTATIN/NYSTOP) powder [id number] No as needed (skin irritation). [provider] Taking Active   Polyvinyl Alcohol-Povidone PF 1.4-0.6 % SOLN [id number] No Place 1 drop into both eyes daily as needed (dry eyes).  [provider] Taking Active   RESTASIS 0.05 % ophthalmic emulsion [id number] No as needed (dry eyes). [provider] Taking Active   TART CHERRY PO [id number] No  [provider] Taking Active   valsartan (DIOVAN) 40 MG tablet [id number] No Take 1 tablet (40 mg total) by mouth daily. Dorothyann Peng, MD Taking Active   Med List Note Maple Hudson, Rennis Chris, MD 04/12/11 2140): CPAP SMS 7            SDOH:  (Social Determinants of Health) assessments and interventions performed: No SDOH Interventions    Flowsheet Row Office Visit from 06/06/2021 in [facility] Chronic Care Management from 01/03/2021 in [facility] Office Visit from 11/02/2020 in [facility] Clinical Support from 10/28/2019 in [facility] Clinical Support from 10/23/2018 in [facility] Office Visit from 05/15/2018 in [facility]  SDOH Interventions        Food Insecurity Interventions -- Intervention Not Indicated -- -- -- --  Housing Interventions -- Intervention Not Indicated -- -- -- --  Transportation Interventions -- Intervention Not Indicated -- -- -- --  Depression Interventions/Treatment   Patient refuses Treatment -- Patient refuses Treatment PHQ2-9 Score <4 Follow-up Not Indicated PHQ2-9 Score <4 Follow-up Not Indicated Counseling, Medication  [She will c/w therapy as per SEL group. She admits that she is overwhelmed w/ the care of her Mother's estate, the care of her brother, and she has had two losses in her family. She is agreeable to possibly starting meds]       Medication Assistance: None required.  Patient affirms current coverage meets needs.  Medication Access: Within the past 30 days, how often has patient missed a dose of medication? Yes Is a pillbox or other method used to improve adherence? No  Factors that may affect medication adherence? nonadherence to medications, lack of understanding of disease management, and memory Are meds synced by current pharmacy? No  Are meds delivered by current pharmacy? No  Does patient experience delays in picking up medications due to transportation concerns? Yes   [facility] Reviewed: Is patient disadvantaged to use [facility]?: No  Current Rx insurance plan: [facility] Medicare Name and location of Current pharmacy:  [id number] - [place], [place] - [address] AT [address] [place] [address] Phone: [phone] Fax: [phone]  [facility] services reviewed with patient today?: Yes  Patient requests to transfer care to [facility]?: Yes   Compliance/Adherence/Medication fill history: Care Gaps: Annual wellness visit in last year? Yes Covid booster overdue A1C overdue   If Diabetic: Last eye exam / retinopathy screening: 09-2021 next appointment 09-18-2022 Last diabetic foot exam: 06-01-2022  Star-Rating Drugs: Atorvastatin 40 mg- Last [facility]-2022 [facility]. Previous 04-22-2022 90 DS Valsartan 40 mg- last filled 08-14-2022 90 DS. No previous fills    Assessment/Plan   Hypertension (BP goal <130/80) -Not ideally  controlled -Current treatment: Amlodipine 10 mg tablet once per day Appropriate, Effective, Safe, Accessible Valsartan 40 mg tablet once per day Appropriate, Effective, Safe, Accessible -Medications previously tried: Felodipine   -Current home readings: 4/15-138/74, 4/2 - 185/71, 167/   -She reports that she usually eats her meal and check her BP  -Current dietary habits: will discuss during next office visit  -Current exercise habits: she is not exercising yet, she has not gone back to the [facility]. Patients daughter is going to confirm with the cardiology team that it is okay to start going back to the [facility].  -Denies hypotensive/hypertensive symptoms -Educated on BP goals and benefits of medications for prevention of heart attack, stroke and kidney damage; Exercise goal of 150 minutes per week; -Spoke with Ms. Zullo and her daughter today, they report being open to start packaging of medication -Reviewed everything with their mom and she is also interested in the packaging system -For the next couple of weeks she is going to check her BP at night before bed, and in the morning before breakfast.  -We discussed Ms. Patmon to continue taking 2 tablets at bedtime  -Counseled to monitor BP at home at least , document, and provide log at future appointments -Collaborated with patient and daughter to start onboarding process for patient      Cherylin Mylar, CPP, PharmD Clinical Pharmacist Practitioner [facility] [phone]

## 2022-09-21 ENCOUNTER — Telehealth: Payer: Self-pay

## 2022-09-21 ENCOUNTER — Telehealth: Payer: Self-pay | Admitting: Internal Medicine

## 2022-09-21 MED ORDER — EZETIMIBE 10 MG PO TABS: 10.0000 mg | ORAL_TABLET | Freq: Every day | ORAL | 10 refills | Status: DC

## 2022-09-21 NOTE — Progress Notes (Cosign Needed Addendum)
09-21-2022: Completed and uploaded onboard for upstream. Requested Prescriptions from PCP and contacted cardiology spoke with Dondra Spry for zetia prescription transfer.   Huey Romans [facility] Clinical Pharmacist Assistant [phone]

## 2022-09-21 NOTE — Telephone Encounter (Signed)
*  STAT* If patient is at the pharmacy, call can be transferred to refill team.   1. Which medications need to be refilled? (please list name of each medication and dose if known) ezetimibe (ZETIA) 10 MG tablet  2. Which pharmacy/location (including street and city if local pharmacy) is medication to be sent to? [facility] - [place], [place] - [address]  3. Do they need a 30 day or 90 day supply? 30 day  

## 2022-09-21 NOTE — Telephone Encounter (Signed)
Pt's medication was sent to pt's pharmacy as requested. Confirmation received.  °

## 2022-09-24 ENCOUNTER — Encounter: Payer: Self-pay | Admitting: Internal Medicine

## 2022-09-24 ENCOUNTER — Ambulatory Visit: Payer: Medicare PPO | Admitting: Internal Medicine

## 2022-09-24 VITALS — BP 128/60 | HR 70 | Temp 98.4°F | Ht 59.0 in | Wt 151.0 lb

## 2022-09-24 DIAGNOSIS — R413 Other amnesia: Secondary | ICD-10-CM | POA: Diagnosis not present

## 2022-09-24 DIAGNOSIS — E1122 Type 2 diabetes mellitus with diabetic chronic kidney disease: Secondary | ICD-10-CM

## 2022-09-24 DIAGNOSIS — G8929 Other chronic pain: Secondary | ICD-10-CM

## 2022-09-24 DIAGNOSIS — N182 Chronic kidney disease, stage 2 (mild): Secondary | ICD-10-CM | POA: Diagnosis not present

## 2022-09-24 DIAGNOSIS — M25511 Pain in right shoulder: Secondary | ICD-10-CM | POA: Diagnosis not present

## 2022-09-24 DIAGNOSIS — R350 Frequency of micturition: Secondary | ICD-10-CM

## 2022-09-24 DIAGNOSIS — E559 Vitamin D deficiency, unspecified: Secondary | ICD-10-CM | POA: Diagnosis not present

## 2022-09-24 DIAGNOSIS — K644 Residual hemorrhoidal skin tags: Secondary | ICD-10-CM

## 2022-09-24 DIAGNOSIS — C773 Secondary and unspecified malignant neoplasm of axilla and upper limb lymph nodes: Secondary | ICD-10-CM

## 2022-09-24 DIAGNOSIS — E6609 Other obesity due to excess calories: Secondary | ICD-10-CM

## 2022-09-24 DIAGNOSIS — D860 Sarcoidosis of lung: Secondary | ICD-10-CM | POA: Diagnosis not present

## 2022-09-24 DIAGNOSIS — Z6830 Body mass index (BMI) 30.0-30.9, adult: Secondary | ICD-10-CM

## 2022-09-24 NOTE — Progress Notes (Signed)
Hershal Coria Martin,acting as a Neurosurgeon for Mariah Aliment, MD.,have documented all relevant documentation on the behalf of Mariah Aliment, MD,as directed by  Mariah Aliment, MD while in the presence of Mariah Aliment, MD.    Subjective:     Patient ID: Mariah Burns , female    DOB: Apr 03, 1946 , 77 y.o.   MRN: [id number]   Chief Complaint  Patient presents with   Neurologist Referral     HPI  Patient presents today for neurologist referral. Patient reports having issues with memory and her family wants her to see a neurologist for further evaluation. She agrees her sx have worsened since moving into her new home. She left her home to move in with her daughter. Patient also would like her ears checked.  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: failed Imuran & Prednisone therapy d/t adverse side effects TLC 106%, DLCO 75% 2009     Family History  Problem Relation Age of Onset   Allergies Sister    Heart disease Sister    Lupus Sister    Allergies Mother    Allergies Daughter    Healthy Daughter    Heart disease Sister    Gout Brother    Hypertension Brother    Heart disease Brother    Heart failure Father    Gout Brother    Hypertension Brother    Hypertension Brother    Hypertension Brother    Post-traumatic stress disorder Brother    Healthy Daughter      Current Outpatient Medications:    ezetimibe (ZETIA) 10 MG tablet, Take 1 tablet (10 mg total) by mouth daily., Disp: 30 tablet, Rfl: 10   Fluocinolone Acetonide Scalp 0.01 % OIL, Apply to affected area of scalp as directed 1-2 x/day as needed for itching,  Disp: 118.28 mL, Rfl: 1   Menthol, Topical Analgesic, (BIOFREEZE ROLL-ON EX), Apply 1 application. topically as needed (pain in back,neck,thumb)., Disp: , Rfl:    nitroGLYCERIN (NITROSTAT) 0.4 MG SL tablet, Place 1 tablet (0.4 mg total) under the tongue every 5 (five) minutes as needed for chest pain., Disp: 25 tablet, Rfl: 2   nystatin (MYCOSTATIN/NYSTOP) powder, as needed (skin irritation)., Disp: , Rfl:    Polyvinyl Alcohol-Povidone PF 1.4-0.6 % SOLN, Place 1 drop into both eyes daily as needed (dry eyes). , Disp: , Rfl:    RESTASIS 0.05 % ophthalmic emulsion, as needed (dry eyes)., Disp: , Rfl:    TART CHERRY PO, , Disp: , Rfl:    amLODipine (NORVASC) 10 MG tablet, Take 1 tablet (10 mg total) by mouth daily., Disp: 90 tablet, Rfl: 2   aspirin EC 81 MG tablet, Take 81 mg by mouth daily. Swallow whole. (Patient not taking: Reported on 08/29/2022), Disp: , Rfl:    atorvastatin (LIPITOR) 40 MG tablet, Take one tablet by mouth on Monday-Friday, skip weekends., Disp: 90 tablet, Rfl: 2   CALCIUM PO, Take by mouth daily. (Patient not taking: Reported on 08/29/2022), Disp: , Rfl:    Cholecalciferol (  VITAMIN D-3 PO), Take 1,000 Units by mouth daily. (Patient not taking: Reported on 08/29/2022), Disp: , Rfl:    Cyanocobalamin (VITAMIN B-12 PO), , Disp: , Rfl:    fish oil-omega-3 fatty acids 1000 MG capsule, Take 2 capsules by mouth daily. (Patient not taking: Reported on 08/29/2022), Disp: , Rfl:    GLUCOSAMINE-CHONDROITIN PO, Take 2 tablets by mouth daily. (Patient not taking: Reported on 08/29/2022), Disp: , Rfl:    L-Methylfolate-Algae-B12-B6 (METANX) 3-90.314-2-35 MG CAPS, Take 1 capsule by mouth 2 (two) times daily.  (Patient not taking: Reported on 08/29/2022), Disp: , Rfl:    Magnesium 250 MG TABS, Take by mouth daily. Tues, Thursday, Saturday (Patient not taking: Reported on 08/29/2022), Disp: , Rfl:    metoprolol tartrate (LOPRESSOR) 50 MG tablet, Take 2 hours prior to Cardiac CT (Patient not taking:  Reported on 08/29/2022), Disp: 1 tablet, Rfl: 0   valsartan (DIOVAN) 40 MG tablet, Take 1 tablet (40 mg total) by mouth daily., Disp: 90 tablet, Rfl: 1  Current Facility-Administered Medications:    0.9 %  sodium chloride infusion, , Intravenous, PRN, Arnette Felts, FNP   Allergies  Allergen Reactions   Fosamax [Alendronate Sodium] Other (See Comments)    REACTION: "CAUSE GI UPSET AND FATIGUE"   Omeprazole Other (See Comments)    Lower ab pain   Motrin [Ibuprofen] Other (See Comments)    GI upset- has to eat prior to taking   Fluvastatin Other (See Comments)    mild memoryproblems of confusion      Review of Systems  Constitutional: Negative.   Respiratory: Negative.    Cardiovascular: Negative.   Gastrointestinal:  Positive for blood in stool.       She reports h/o hemorrhoids. Had blood in stool this morning. Has not used any OTC meds for relief. Admits to having hard stool  Musculoskeletal:  Positive for arthralgias.       She c/o r shoulder pain. Denies fall/trauma. There is pain with movement.   Neurological: Negative.   Psychiatric/Behavioral: Negative.       Today's Vitals   09/24/22 1630  BP: 128/60  Pulse: 70  Temp: 98.4 F (36.9 C)  TempSrc: Oral  Weight: 151 lb (68.5 kg)  Height: 4\' 11"  (1.499 m)  PainSc: 5   PainLoc: Finger   Body mass index is 30.5 kg/m.  Wt Readings from Last 3 Encounters:  09/24/22 151 lb (68.5 kg)  08/30/22 151 lb (68.5 kg)  08/29/22 151 lb (68.5 kg)    Objective:  Physical Exam Vitals and nursing note reviewed.  Constitutional:      Appearance: Normal appearance.  HENT:     Head: Normocephalic and atraumatic.     Nose:     Comments: Masked     Mouth/Throat:     Comments: Masked  Eyes:     Extraocular Movements: Extraocular movements intact.  Cardiovascular:     Rate and Rhythm: Normal rate and regular rhythm.     Heart sounds: Normal heart sounds.  Pulmonary:     Effort: Pulmonary effort is normal.     Breath sounds:  Normal breath sounds.  Musculoskeletal:        General: Tenderness present.     Cervical back: Normal range of motion.     Comments: Decreased ROM R shoulder, pain with active/passive movement  Skin:    General: Skin is warm.  Neurological:     General: No focal deficit present.     Mental Status: She is alert.  Psychiatric:        Mood and Affect: Mood normal.        Behavior: Behavior normal.       Assessment And Plan:     1. Memory loss Comments: 6 CIT performed. Daughter contacted during visit, she agrees w/ Neuro eval. - Vitamin B12 - TSH - Urinalysis, Reflex Microscopic - Ambulatory referral to Neurology  2. Type 2 diabetes mellitus with stage 2 chronic kidney disease, without long-term current use of insulin (HCC) Comments: Chronic, I will check labs as below. She is not taking any rx meds at this time. - Hemoglobin A1c  3. Chronic right shoulder pain Comments: Acute exacerbation of chronic issue. She agrees to Ortho evaluation, possible joint injection. - Ambulatory referral to Orthopedic Surgery  4. Pulmonary sarcoidosis (HCC) Comments: Chronic, sx are stable. Recent Pulmonary notes reviewed. She is currently in remission.  5. Vitamin D deficiency disease Comments: I will check a vitamin D level and supplement as needed. - Vitamin D (25 hydroxy)  6. External hemorrhoid, bleeding - CBC no Diff  7. Class 1 obesity due to excess calories with serious comorbidity and body mass index (BMI) of 30.0 to 30.9 in adult Comments: She is encouraged to aim for at least 150 minutes of exercise/week. She is reminded to do chair exercises while watching TV.   Patient was given opportunity to ask questions. Patient verbalized understanding of the plan and was able to repeat key elements of the plan. All questions were answered to their satisfaction.   I, Mariah Aliment, MD, have reviewed all documentation for this visit. The documentation on 09/30/22 for the exam, diagnosis,  procedures, and orders are all accurate and complete.   IF YOU HAVE BEEN REFERRED TO A SPECIALIST, IT MAY TAKE 1-2 WEEKS TO SCHEDULE/PROCESS THE REFERRAL. IF YOU HAVE NOT HEARD FROM US/SPECIALIST IN TWO WEEKS, PLEASE GIVE [place] A CALL AT [phone] X [phone].   THE PATIENT IS ENCOURAGED TO PRACTICE SOCIAL DISTANCING DUE TO THE COVID-19 PANDEMIC.

## 2022-09-25 ENCOUNTER — Other Ambulatory Visit: Payer: Self-pay

## 2022-09-25 ENCOUNTER — Telehealth: Payer: Self-pay

## 2022-09-25 DIAGNOSIS — E7800 Pure hypercholesterolemia, unspecified: Secondary | ICD-10-CM

## 2022-09-25 DIAGNOSIS — I10 Essential (primary) hypertension: Secondary | ICD-10-CM

## 2022-09-25 LAB — URINALYSIS, ROUTINE W REFLEX MICROSCOPIC
Bilirubin, UA: NEGATIVE
Glucose, UA: NEGATIVE
Ketones, UA: NEGATIVE
Nitrite, UA: NEGATIVE
Protein,UA: NEGATIVE
RBC, UA: NEGATIVE
Specific Gravity, UA: 1.009 (ref 1.005–1.030)
Urobilinogen, Ur: 0.2 mg/dL (ref 0.2–1.0)
pH, UA: 6.5 (ref 5.0–7.5)

## 2022-09-25 LAB — CBC
Hematocrit: 37.8 % (ref 34.0–46.6)
Hemoglobin: 12.2 g/dL (ref 11.1–15.9)
MCH: 27.9 pg (ref 26.6–33.0)
MCHC: 32.3 g/dL (ref 31.5–35.7)
MCV: 87 fL (ref 79–97)
Platelets: 201 10*3/uL (ref 150–450)
RBC: 4.37 x10E6/uL (ref 3.77–5.28)
RDW: 13.5 % (ref 11.7–15.4)
WBC: 4.9 10*3/uL (ref 3.4–10.8)

## 2022-09-25 LAB — VITAMIN D 25 HYDROXY (VIT D DEFICIENCY, FRACTURES): Vit D, 25-Hydroxy: 31.8 ng/mL (ref 30.0–100.0)

## 2022-09-25 LAB — VITAMIN B12: Vitamin B-12: 1280 pg/mL — ABNORMAL HIGH (ref 232–1245)

## 2022-09-25 LAB — HEMOGLOBIN A1C
Est. average glucose Bld gHb Est-mCnc: 131 mg/dL
Hgb A1c MFr Bld: 6.2 % — ABNORMAL HIGH (ref 4.8–5.6)

## 2022-09-25 LAB — MICROSCOPIC EXAMINATION
Bacteria, UA: NONE SEEN
Casts: NONE SEEN /lpf
Epithelial Cells (non renal): NONE SEEN /hpf (ref 0–10)
RBC, Urine: NONE SEEN /hpf (ref 0–2)

## 2022-09-25 LAB — TSH: TSH: 0.742 u[IU]/mL (ref 0.450–4.500)

## 2022-09-25 MED ORDER — VALSARTAN 40 MG PO TABS: 40.0000 mg | ORAL_TABLET | Freq: Every day | ORAL | 1 refills | Status: DC

## 2022-09-25 MED ORDER — ATORVASTATIN CALCIUM 40 MG PO TABS
ORAL_TABLET | ORAL | 2 refills | Status: DC
Start: 2022-09-25 — End: 2022-10-23

## 2022-09-25 MED ORDER — VALSARTAN 40 MG PO TABS
40.0000 mg | ORAL_TABLET | Freq: Every day | ORAL | 1 refills | Status: DC
Start: 2022-09-25 — End: 2022-11-01

## 2022-09-25 MED ORDER — AMLODIPINE BESYLATE 10 MG PO TABS
10.0000 mg | ORAL_TABLET | Freq: Every day | ORAL | 2 refills | Status: DC
Start: 2022-09-25 — End: 2023-05-06

## 2022-09-25 MED ORDER — ATORVASTATIN CALCIUM 40 MG PO TABS
ORAL_TABLET | ORAL | 3 refills | Status: DC
Start: 2022-09-25 — End: 2022-09-25

## 2022-09-25 MED ORDER — AMLODIPINE BESYLATE 10 MG PO TABS
10.0000 mg | ORAL_TABLET | Freq: Every day | ORAL | 1 refills | Status: DC
Start: 2022-09-25 — End: 2022-09-25

## 2022-09-25 NOTE — Telephone Encounter (Signed)
Patient requires refills of Amlodipine, Atorvastatin and Valsartan to be sent to [facility]. Prescriptions sent to pharmacy for adherence packaging.   Cherylin Mylar, CPP, PharmD Clinical Pharmacist Practitioner [facility] [phone]

## 2022-09-27 ENCOUNTER — Ambulatory Visit: Payer: Medicare PPO | Admitting: Orthopaedic Surgery

## 2022-09-27 ENCOUNTER — Ambulatory Visit (INDEPENDENT_AMBULATORY_CARE_PROVIDER_SITE_OTHER): Payer: Medicare PPO

## 2022-09-27 ENCOUNTER — Other Ambulatory Visit: Payer: Self-pay

## 2022-09-27 ENCOUNTER — Ambulatory Visit (INDEPENDENT_AMBULATORY_CARE_PROVIDER_SITE_OTHER): Payer: Medicare PPO | Admitting: Sports Medicine

## 2022-09-27 DIAGNOSIS — M25511 Pain in right shoulder: Secondary | ICD-10-CM

## 2022-09-27 DIAGNOSIS — G8929 Other chronic pain: Secondary | ICD-10-CM

## 2022-09-27 MED ORDER — METHYLPREDNISOLONE ACETATE 40 MG/ML IJ SUSP
80.0000 mg | INTRAMUSCULAR | Status: AC | PRN
Start: 2022-09-27 — End: 2022-09-27
  Administered 2022-09-27: 80 mg via INTRA_ARTICULAR

## 2022-09-27 MED ORDER — BUPIVACAINE HCL 0.25 % IJ SOLN
2.0000 mL | INTRAMUSCULAR | Status: AC | PRN
Start: 2022-09-27 — End: 2022-09-27
  Administered 2022-09-27: 2 mL via INTRA_ARTICULAR

## 2022-09-27 MED ORDER — LIDOCAINE HCL 1 % IJ SOLN
2.0000 mL | INTRAMUSCULAR | Status: AC | PRN
Start: 2022-09-27 — End: 2022-09-27
  Administered 2022-09-27: 2 mL

## 2022-09-27 NOTE — Progress Notes (Signed)
   Procedure Note  Patient: Mariah Burns             Date of Birth: [birth year]           MRN: [id number]             Visit Date: 09/27/2022  Procedures: Visit Diagnoses:  1. Chronic right shoulder pain    Large Joint Inj: R glenohumeral on 09/27/2022 10:35 AM Indications: pain Details: 22 G 3.5 in needle, ultrasound-guided posterior approach Medications: 2 mL lidocaine 1 %; 2 mL bupivacaine 0.25 %; 80 mg methylPREDNISolone acetate 40 MG/ML Outcome: tolerated well, no immediate complications  US-guided glenohumeral joint injection, right shoulder After discussion on risks/benefits/indications, informed verbal consent was obtained. A timeout was then performed. The patient was positioned lying lateral recumbent on examination table. The patient's shoulder was prepped with betadine and multiple alcohol swabs and utilizing ultrasound guidance, the patient's glenohumeral joint was identified on ultrasound. Using ultrasound guidance a 22-gauge, 3.5 inch needle with a mixture of 2:2:2 cc's lidocaine:bupivicaine:depomedrol was directed from a lateral to medial direction via in-plane technique into the glenohumeral joint with visualization of appropriate spread of injectate into the joint. Patient tolerated the procedure well without immediate complications.      Procedure, treatment alternatives, risks and benefits explained, specific risks discussed. Consent was given by the patient. Immediately prior to procedure a time out was called to verify the correct patient, procedure, equipment, support staff and site/side marked as required. Patient was prepped and draped in the usual sterile fashion.    - I evaluated the patient about 10 minutes post-injection and she had some soreness but overall improvement in pain and range of motion - follow-up with Dr. Roda Shutters as indicated; I am happy to see them as needed  Madelyn Brunner, DO Primary Care Sports Medicine Physician  [facility] -  Orthopedics  This note was dictated using Dragon naturally speaking software and may contain errors in syntax, spelling, or content which have not been identified prior to signing this note.

## 2022-09-27 NOTE — Progress Notes (Signed)
Office Visit Note   Patient: Mariah Burns           Date of Birth: 25-Mar-1946           MRN: [id number] Visit Date: 09/27/2022              Requested by: Dorothyann Peng, MD [address] [place],  [place] [address] PCP: Dorothyann Peng, MD   Assessment & Plan: Visit Diagnoses:  1. Acute pain of right shoulder     Plan: Impression is chronic right shoulder pain in a 77 year old female.  Based on options I would like to try intra-articular steroid injection.  Will send her to Dr. Shon Baton for this.  Follow-up if symptoms persist.  Follow-Up Instructions: No follow-ups on file.   Orders:  Orders Placed This Encounter  Procedures   XR Shoulder Right   XR Cervical Spine 2 or 3 views   No orders of the defined types were placed in this encounter.     Procedures: No procedures performed   Clinical Data: No additional findings.   Subjective: Chief Complaint  Patient presents with   Right Shoulder - Pain    HPI  Patient is a very pleasant 77 year old female here for chronic right shoulder pain for over a month.  Denies any injuries or radicular symptoms.  Has decreased range of motion secondary to pain.  Pain is worse when sleeping on the right side.  Review of Systems  Constitutional: Negative.   HENT: Negative.    Eyes: Negative.   Respiratory: Negative.    Cardiovascular: Negative.   Endocrine: Negative.   Musculoskeletal: Negative.   Neurological: Negative.   Hematological: Negative.   Psychiatric/Behavioral: Negative.    All other systems reviewed and are negative.    Objective: Vital Signs: There were no vitals taken for this visit.  Physical Exam Vitals and nursing note reviewed.  Constitutional:      Appearance: She is well-developed.  HENT:     Head: Atraumatic.     Nose: Nose normal.  Eyes:     Extraocular Movements: Extraocular movements intact.  Cardiovascular:     Pulses: Normal pulses.  Pulmonary:     Effort: Pulmonary  effort is normal.  Abdominal:     Palpations: Abdomen is soft.  Musculoskeletal:     Cervical back: Neck supple.  Skin:    General: Skin is warm.     Capillary Refill: Capillary refill takes less than 2 seconds.  Neurological:     Mental Status: She is alert. Mental status is at baseline.  Psychiatric:        Behavior: Behavior normal.        Thought Content: Thought content normal.        Judgment: Judgment normal.     Ortho Exam  Examination right shoulder shows pain with passive and active range of motion.  Manual muscle testing is difficult secondary to pain and guarding.  Do not see any shrugging to compensate for the abduction.  Specialty Comments:  No specialty comments available.  Imaging: XR Shoulder Right  Result Date: 09/27/2022 Mild degenerative changes without any acute abnormalities.  XR Cervical Spine 2 or 3 views  Result Date: 09/27/2022 Mild degenerative changes without any acute abnormalities.    PMFS History: Patient Active Problem List   Diagnosis Date Noted   Mild cognitive impairment 10/22/2021   Chronic diastolic heart failure 06/06/2021   Insomnia 03/30/2021   Pure hypercholesterolemia 03/07/2021   Reactive depression 11/19/2020  Dry mouth 11/19/2020   Memory loss 11/19/2020   Secondary and unspecified malignant neoplasm of axilla and upper limb lymph nodes 10/28/2019   Class 1 obesity due to excess calories with serious comorbidity and body mass index (BMI) of 30.0 to 30.9 in adult 10/28/2019   Vitamin D deficiency disease 10/28/2019   Chronic bilateral low back pain without sciatica 10/28/2019   Hip pain, bilateral 10/28/2019   Malignant neoplasm of upper-outer quadrant of right breast in female, estrogen receptor positive 08/11/2018   Chronic renal disease, stage II 05/15/2018   Hypertensive nephropathy 05/15/2018   Adult BMI 29.0-29.9 kg/sq m 05/15/2018   Dyspnea on exertion 02/17/2015   Incomplete right bundle branch block 02/17/2015    Systolic murmur 02/17/2015   Pericardial effusion 02/17/2015   Osteopenia 10/05/2011   Type 2 diabetes mellitus with stage 2 chronic kidney disease, without long-term current use of insulin 05/06/2010   Pulmonary sarcoidosis 03/21/2007   Hyperlipidemia with target LDL less than 70 03/21/2007   Obstructive sleep apnea 03/21/2007   Essential hypertension 03/21/2007   Past Medical History:  Diagnosis Date   Anxiety    Cancer 08/2018   right breast IDC   Hyperlipidemia    Hypertension    OSA (obstructive sleep apnea)    NPSG 03/08/2010  AHI 19.8/hr, CPAP nightly   Osteopenia    Pre-diabetes    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: failed Imuran & Prednisone therapy d/t adverse side effects TLC 106%, DLCO 75% 2009    Family History  Problem Relation Age of Onset   Allergies Sister    Heart disease Sister    Lupus Sister    Allergies Mother    Allergies Daughter    Healthy Daughter    Heart disease Sister    Gout Brother    Hypertension Brother    Heart disease Brother    Heart failure Father    Gout Brother    Hypertension Brother    Hypertension Brother    Hypertension Brother    Post-traumatic stress disorder Brother    Healthy Daughter     Past Surgical History:  Procedure Laterality Date   BREAST LUMPECTOMY WITH RADIOACTIVE SEED AND SENTINEL LYMPH NODE BIOPSY Right 09/02/2018   Procedure: RIGHT BREAST LUMPECTOMY WITH RADIOACTIVE SEED AND  RIGHT SENTINEL LYMPH NODE BIOPSY;  Surgeon: Glenna Fellows, MD;  Location: [facility];  Service: General;  Laterality: Right;   BRONCHOSCOPY     dx'd sarcoid   KNEE ARTHROPLASTY Left 03/13/2017   NEUROPLASTY / TRANSPOSITION ULNAR NERVE AT ELBOW  09/2011   TOTAL ABDOMINAL HYSTERECTOMY     Social History   Occupational History   Occupation: Retired    Associate Professor: RETIRED  Tobacco Use   Smoking status: Never    Passive exposure: Never   Smokeless tobacco: Never  Vaping Use   Vaping Use:  Never used  Substance and Sexual Activity   Alcohol use: No   Drug use: No   Sexual activity: Not Currently

## 2022-09-29 LAB — SPECIMEN STATUS REPORT

## 2022-09-29 LAB — T4, FREE: Free T4: 0.9 ng/dL (ref 0.82–1.77)

## 2022-09-30 NOTE — Patient Instructions (Signed)

## 2022-10-02 ENCOUNTER — Other Ambulatory Visit: Payer: Self-pay

## 2022-10-02 ENCOUNTER — Encounter: Payer: Self-pay | Admitting: Physician Assistant

## 2022-10-02 ENCOUNTER — Ambulatory Visit: Payer: Medicare PPO | Attending: Cardiovascular Disease | Admitting: Student

## 2022-10-02 VITALS — BP 125/61 | HR 67

## 2022-10-02 DIAGNOSIS — I5032 Chronic diastolic (congestive) heart failure: Secondary | ICD-10-CM

## 2022-10-02 DIAGNOSIS — I10 Essential (primary) hypertension: Secondary | ICD-10-CM | POA: Diagnosis not present

## 2022-10-02 MED ORDER — EMPAGLIFLOZIN 10 MG PO TABS: 10.0000 mg | ORAL_TABLET | Freq: Every day | ORAL | 11 refills | Status: DC

## 2022-10-02 NOTE — Assessment & Plan Note (Signed)
Assessment: BP is controlled in office BP 120/68 with heart rate 67   (goal <130/80) Home BP ~ 148-160/68-70  Home BP monitor validated today - not  accurate; suggest to get new one and get validated at the next OV  Denies SOB, palpitation, chest pain, headaches,or swelling Reiterated the importance of  and low salt diet   Plan:  Given the controlled BP in the office not making any changes to the BP medications  Continue taking amlodipine and valsartan 40 mg daily Patient to bring new BP monitor at next office visit for validation  Patient to keep record of BP readings with heart rate and report to [place] at the next visit Patient to see PharmD in 4 weeks for follow up

## 2022-10-02 NOTE — Progress Notes (Signed)
Patient ID: Mariah Burns                 DOB: August 28, 1945                      MRN: [id number]      HPI: Mariah Burns is a 77 y.o. female referred by Dr. Izora Ribas to HTN clinic. PMH is significant for hypertension, pericardial effusion, OSA, T2DM, chronic diastolic heart failure, obesity. At the last visit with Pharm D BP was controlled so no medications were changed.   Today patient presented with home BP monitor - wrist cuff and arm cuff for validation. Both cuff found out to be inaccurate. Both were reading 20 points higher than manually check BP. Patient has cut down on eating out and eats low salt food at home. We discussed benefits of SGLT2 given diastolic HF and borderline A1c. Patient is in agreement with starting Jardiance but high co-pay can be barrier so enrolled her in [facility].        Home BP monitor validation    1st reading on home BP monitor  162/68 HR 66  1st reading manual  120/60 HR 67  2nd reading on home BP monitor  141/72 HR 66  2nd reading manual  125/62 HR 67    Current HTN meds: amlodipine 10 mg daily,valsartan 40 mg daily  Previously tried: felodipine 10 mg -dizziness  BP goal: <130/80  Family History:  Mom: heart problems, hypertension, T2DM Dad: died from work place accident  Siblings- sister and brother- T2DM, HTN  2 younger brothers passed away from drug over doses    Social History:  Alcohol: none  Smoking: never   Diet:  watches salt intake  Eats out once or twice week  has cut down on eating out  Home - does not cook with salt Breakfast: boiled eggs, banana muffins, watermelon, melon, yogurts Lunch: skips most days  Dinner usually by 4 pm: chicken casserole with broccoli, vegetables, grilled or baked meat and one grain choice Snack- fruit, goldfishes crackers,salty snacks (chips)  apple juice - low sodium and low sugar, peanut butter crackers    Exercise: none  Was going to Gym regularly, stopped going due to chest discomfort     Home BP readings: 148-160/68-70    Wt Readings from Last 3 Encounters:  09/24/22 151 lb (68.5 kg)  08/30/22 151 lb (68.5 kg)  08/29/22 151 lb (68.5 kg)   BP Readings from Last 3 Encounters:  10/02/22 125/61  09/24/22 128/60  09/05/22 127/68   Pulse Readings from Last 3 Encounters:  10/02/22 67  09/24/22 70  09/05/22 65    Renal function: CrCl cannot be calculated (Patient's most recent lab result is older than the maximum 21 days allowed.).  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 90 tablet 2   aspirin EC 81 MG tablet  Take 81 mg by mouth daily. Swallow whole. (Patient not taking: Reported on 08/29/2022)     atorvastatin (LIPITOR) 40 MG tablet Take one tablet by mouth on Monday-Friday, skip weekends. 90 tablet 2   CALCIUM PO Take by mouth daily. (Patient not taking: Reported on 08/29/2022)     Cholecalciferol (VITAMIN D-3 PO) Take 1,000 Units by mouth daily. (Patient not taking: Reported on 08/29/2022)     Cyanocobalamin (VITAMIN B-12 PO)  (Patient not taking: Reported on 08/29/2022)     ezetimibe (ZETIA) 10 MG tablet Take 1 tablet (10 mg total) by mouth daily. 30 tablet 10   fish oil-omega-3 fatty acids 1000 MG capsule Take 2 capsules by mouth daily. (Patient not taking: Reported on 08/29/2022)     Fluocinolone Acetonide Scalp 0.01 % OIL Apply to affected area of scalp as directed 1-2 x/day as needed for itching 118.28 mL 1   GLUCOSAMINE-CHONDROITIN PO Take 2 tablets by mouth  daily. (Patient not taking: Reported on 08/29/2022)     L-Methylfolate-Algae-B12-B6 (METANX) 3-90.314-2-35 MG CAPS Take 1 capsule by mouth 2 (two) times daily.  (Patient not taking: Reported on 08/29/2022)     Magnesium 250 MG TABS Take by mouth daily. Tues, Thursday, Saturday (Patient not taking: Reported on 08/29/2022)     Menthol, Topical Analgesic, (BIOFREEZE ROLL-ON EX) Apply 1 application. topically as needed (pain in back,neck,thumb).     metoprolol tartrate (LOPRESSOR) 50 MG tablet Take 2 hours prior to Cardiac CT (Patient not taking: Reported on 08/29/2022) 1 tablet 0   nitroGLYCERIN (NITROSTAT) 0.4 MG SL tablet Place 1 tablet (0.4 mg total) under the tongue every 5 (five) minutes as needed for chest pain. 25 tablet 2   nystatin (MYCOSTATIN/NYSTOP) powder as needed (skin irritation).     Polyvinyl Alcohol-Povidone PF 1.4-0.6 % SOLN Place 1 drop into both eyes daily as needed (dry eyes).      RESTASIS 0.05 % ophthalmic emulsion as needed (dry eyes).     TART CHERRY PO      valsartan (DIOVAN) 40 MG tablet Take 1 tablet (40 mg total) by mouth daily. 90 tablet 1   Current Facility-Administered Medications on File Prior to Visit  Medication Dose Route Frequency Provider Last Rate Last Admin   0.9 %  sodium chloride infusion   Intravenous PRN Arnette Felts, FNP        Allergies  Allergen Reactions   Fosamax [Alendronate Sodium] Other (See Comments)    REACTION: "CAUSE GI UPSET AND FATIGUE"   Omeprazole Other (See Comments)    Lower ab pain   Motrin [Ibuprofen] Other (See Comments)    GI upset- has to eat prior to taking   Fluvastatin Other (See Comments)    mild memoryproblems of confusion     Blood pressure 125/61, pulse 67, SpO2 97 %.   Essential hypertension Assessment: BP is controlled in office BP 120/68 with heart rate 67   (goal <130/80) Home BP ~ 148-160/68-70  Home BP monitor validated today - not  accurate; suggest to get new one and get validated at the next OV  Denies  SOB, palpitation, chest pain, headaches,or swelling Reiterated the importance of  and low salt diet   Plan:  Given the controlled BP in the office not making any changes to the BP medications  Continue taking amlodipine and valsartan 40 mg daily Patient to bring new BP monitor at next office visit for validation  Patient to keep record of BP readings with heart rate and report to [place]  at the next visit Patient to see PharmD in 4 weeks for follow up   Chronic diastolic heart failure (HCC) Assessment/ Plan: Last EF >75% (08/23/2022)  Patient denies any weight gain or swelling or dizziness Follows low salt diet; unable to exercise due to chest discomfort   GDMT: on ARB A1C borderline; to get dual benefit will initiate SGLT2i  Start Jardiance 10 mg daily  Follow up BMP in 2 weeks        Thank you  Carmela Hurt, Pharm.D [facility] [address], [place], [place] [address]  Phone: [phone]; Fax: [phone]

## 2022-10-02 NOTE — Assessment & Plan Note (Addendum)
Assessment/ Plan: Last EF >75% (08/23/2022)  Patient denies any weight gain or swelling or dizziness Follows low salt diet; unable to exercise due to chest discomfort   GDMT: on ARB A1C borderline; to get dual benefit will initiate SGLT2i  Start Jardiance 10 mg daily  Follow up BMP in 2 weeks

## 2022-10-02 NOTE — Patient Instructions (Addendum)
Changes made by your pharmacist Carmela Hurt, PharmD at today's visit:    Instructions/Changes  (what do you need to do) Your Notes  (what you did and when you did it)  1.start taking Jardiance 10 mg daily    2. Continue taking amlodipine 10 mg daily,valsartan 40 mg daily     Bring all of your meds, your BP cuff and your record of home blood pressures to your next appointment.    HOW TO TAKE YOUR BLOOD PRESSURE AT HOME  Rest 5 minutes before taking your blood pressure.  Don't smoke or drink caffeinated beverages for at least 30 minutes before. Take your blood pressure before (not after) you eat. Sit comfortably with your back supported and both feet on the floor (don't cross your legs). Elevate your arm to heart level on a table or a desk. Use the proper sized cuff. It should fit smoothly and snugly around your bare upper arm. There should be enough room to slip a fingertip under the cuff. The bottom edge of the cuff should be 1 inch above the crease of the elbow. Ideally, take 3 measurements at one sitting and record the average.  Important lifestyle changes to control high blood pressure  Intervention  Effect on the BP  Lose extra pounds and watch your waistline Weight loss is one of the most effective lifestyle changes for controlling blood pressure. If you're overweight or obese, losing even a small amount of weight can help reduce blood pressure. Blood pressure might go down by about 1 millimeter of mercury (mm Hg) with each kilogram (about 2.2 pounds) of weight lost.  Exercise regularly As a general goal, aim for at least 30 minutes of moderate physical activity every day. Regular physical activity can lower high blood pressure by about 5 to 8 mm Hg.  Eat a healthy diet Eating a diet rich in whole grains, fruits, vegetables, and low-fat dairy products and low in saturated fat and cholesterol. A healthy diet can lower high blood pressure by up to 11 mm Hg.  Reduce salt (sodium)  in your diet Even a small reduction of sodium in the diet can improve heart health and reduce high blood pressure by about 5 to 6 mm Hg.  Limit alcohol One drink equals 12 ounces of beer, 5 ounces of wine, or 1.5 ounces of 80-proof liquor.  Limiting alcohol to less than one drink a day for women or two drinks a day for men can help lower blood pressure by about 4 mm Hg.   If you have any questions or concerns please use My Chart to send questions or call the office at [phone]

## 2022-10-09 ENCOUNTER — Other Ambulatory Visit: Payer: Self-pay | Admitting: Internal Medicine

## 2022-10-09 DIAGNOSIS — Z1231 Encounter for screening mammogram for malignant neoplasm of breast: Secondary | ICD-10-CM | POA: Diagnosis not present

## 2022-10-09 LAB — HM MAMMOGRAPHY

## 2022-10-09 MED ORDER — NITROGLYCERIN 0.4 MG SL SUBL: 0.4000 mg | SUBLINGUAL_TABLET | SUBLINGUAL | 2 refills | Status: DC | PRN

## 2022-10-10 ENCOUNTER — Telehealth: Payer: Self-pay

## 2022-10-10 NOTE — Progress Notes (Signed)
Care Management & Coordination Services Pharmacy Team  Reason for Encounter: Appointment Reminder  Contacted patient to confirm telephone appointment with Cherylin Mylar, PharmD on 10-12-2022 at 10:00 . Unsuccessful outreach. Left voicemail for patient to return call.  Chart review: Recent office visits:  09-24-2022 Dorothyann Peng, MD. Referrals placed to Orthopedic surgery and neurology. Patient reported not taking pepcid.  Recent consult visits:  10-02-2022 Carmela Hurt, MD (Cardiology). Start jardiance 10 mg daily  09-27-2022 Tarry Kos, MD (Orthopedic surgery). Visit for right shoulder pain. Large joint injection.  Hospital visits:  Medication Reconciliation was completed by comparing discharge summary, patient's EMR and Pharmacy list, and upon discussion with patient.   Admitted to the hospital on 07-24-2022 due to chest pain. Discharge date was 07-24-2022. Discharged from [place] [facility] ED at [facility].     New?Medications Started at [facility] Discharge:?? None   Medication Changes at Hospital Discharge: None   Medications Discontinued at Hospital Discharge: None   Medications that remain the same after Hospital Discharge:??  -All other medications will remain the same.     Star Rating Drugs:  Atorvastatin 40 mg- Last filled 07-23-2022 90 DS [facility]. Previous 04-22-2022 90 DS Valsartan 40 mg- last filled 08-14-2022 90 DS. No previous fills  Care Gaps: Annual wellness visit in last year? Yes Mammogram overdue Covid booster overdue   If Diabetic: Last eye exam / retinopathy screening:09-2022 Last diabetic foot exam:06-01-2022    Huey Romans [facility] Clinical Pharmacist Assistant [phone]

## 2022-10-12 ENCOUNTER — Ambulatory Visit: Payer: Medicare PPO

## 2022-10-12 NOTE — Progress Notes (Signed)
Care Management & Coordination Services Pharmacy Note  10/12/2022 Name:  Mariah Burns MRN:  [id number] DOB:  08/17/1945  Summary: Patient reports that she was started on Jardiance 10 mg tablet once per day but she is uncertain of why she has to take it.   Recommendations/Changes made from today's visit: She would like to start going back to the gym Monday through Friday, to do some exercise. She is going to consider going to the gym gradually maybe once or twice per week for at least 2 months, for thirty minutes or so per time.   Follow up plan: Patient to have a follow up visit in one month to review how medication packaging and delivery is working for her.  HC to review status of when patient will begin receiving deliveries of medication and follow up with the patient and her family.    Subjective: Mariah Burns is an 77 y.o. year old female who is a primary patient of Dorothyann Peng, MD.  The care coordination team was consulted for assistance with disease management and care coordination needs.    Engaged with patient by telephone for follow up visit.  Recent office visits:  09-24-2022 Dorothyann Peng, MD. Referrals placed to Orthopedic surgery and neurology. Patient reported not taking pepcid.   Recent consult visits:  10-02-2022 Carmela Hurt, MD (Cardiology). Start jardiance 10 mg daily   09-27-2022 Tarry Kos, MD (Orthopedic surgery). Visit for right shoulder pain. Large joint injection.   Hospital visits:  Medication Reconciliation was completed by comparing discharge summary, patient's EMR and Pharmacy list, and upon discussion with patient.   Admitted to the hospital on 07-24-2022 due to chest pain. Discharge date was 07-24-2022. Discharged from [place] [facility] ED at [facility].     New?Medications Started at [facility] Discharge:?? None   Medication Changes at Hospital Discharge: None   Medications Discontinued at Hospital Discharge: None    Medications that remain the same after Hospital Discharge:??  -All other medications will remain the same.     Objective:  Lab Results  Component Value Date   CREATININE 0.88 08/29/2022   BUN 12 08/29/2022   EGFR 72 08/21/2022   GFRNONAA >60 08/29/2022   GFRAA 85 05/26/2020   NA 142 08/29/2022   K 3.6 08/29/2022   CALCIUM 9.8 08/29/2022   CO2 31 08/29/2022   GLUCOSE 114 (H) 08/29/2022    Lab Results  Component Value Date/Time   HGBA1C 6.2 (H) 09/24/2022 05:07 PM   HGBA1C 5.9 (H) 03/13/2022 02:02 PM   MICROALBUR 30 02/23/2021 05:06 PM   MICROALBUR 30 02/23/2020 01:26 PM    Last diabetic Eye exam:  Lab Results  Component Value Date/Time   HMDIABEYEEXA No Retinopathy 10/31/2021 12:00 AM    Last diabetic Foot exam: No results found for: "HMDIABFOOTEX"   Lab Results  Component Value Date   CHOL 176 04/24/2022   HDL 61 04/24/2022   LDLCALC 96 04/24/2022   TRIG 103 04/24/2022   CHOLHDL 2.9 04/24/2022       Latest Ref Rng & Units 08/29/2022   11:57 AM 02/28/2022   12:14 PM 01/31/2022   10:51 AM  Hepatic Function  Total Protein 6.5 - 8.1 g/dL 8.0  7.9  7.6   Albumin 3.5 - 5.0 g/dL 4.4  4.3  4.5   AST 15 - 41 U/L 17  21  20    ALT 0 - 44 U/L 14  16  15    Alk Phosphatase 38 - 126 U/L  65  65  76   Total Bilirubin 0.3 - 1.2 mg/dL 0.5  0.4  0.5     Lab Results  Component Value Date/Time   TSH 0.742 09/24/2022 05:07 PM   TSH 1.170 11/02/2020 10:36 AM   FREET4 0.90 09/24/2022 05:07 PM       Latest Ref Rng & Units 09/24/2022    5:07 PM 08/29/2022   11:57 AM 07/24/2022    1:52 PM  CBC  WBC 3.4 - 10.8 x10E3/uL 4.9  4.4  4.1   Hemoglobin 11.1 - 15.9 g/dL 73.2  20.2  54.2   Hematocrit 34.0 - 46.6 % 37.8  36.4  39.4   Platelets 150 - 450 x10E3/uL 201  194  172     Lab Results  Component Value Date/Time   VD25OH 31.8 09/24/2022 05:07 PM   VD25OH 43.0 10/28/2019 11:37 AM   VITAMINB12 1,280 (H) 09/24/2022 05:07 PM   VITAMINB12 >2000 (H) 02/23/2021 04:11 PM     Clinical ASCVD: Yes  The ASCVD Risk score (Arnett DK, et al., 2019) failed to calculate for the following reasons:   Unable to determine if patient is Non-Hispanic African American         09/24/2022    4:25 PM 01/31/2022   10:08 AM 12/06/2021    9:48 AM  Depression screen PHQ 2/9  Decreased Interest 1 0 0  Down, Depressed, Hopeless 3 0 0  PHQ - 2 Score 4 0 0  Altered sleeping 3 0   Tired, decreased energy 3 0   Change in appetite 3 0   Feeling bad or failure about yourself  1 0   Trouble concentrating 3 0   Moving slowly or fidgety/restless 1 0   Suicidal thoughts 0 0   PHQ-9 Score 18 0   Difficult doing work/chores Somewhat difficult Not difficult at all      Social History   Tobacco Use  Smoking Status Never   Passive exposure: Never  Smokeless Tobacco Never   BP Readings from Last 3 Encounters:  10/02/22 125/61  09/24/22 128/60  09/05/22 127/68   Pulse Readings from Last 3 Encounters:  10/02/22 67  09/24/22 70  09/05/22 65   Wt Readings from Last 3 Encounters:  09/24/22 151 lb (68.5 kg)  08/30/22 151 lb (68.5 kg)  08/29/22 151 lb (68.5 kg)   BMI Readings from Last 3 Encounters:  09/24/22 30.50 kg/m  08/30/22 30.50 kg/m  08/29/22 30.50 kg/m    Allergies  Allergen Reactions   Fosamax [Alendronate Sodium] Other (See Comments)    REACTION: "CAUSE GI UPSET AND FATIGUE"   Omeprazole Other (See Comments)    Lower ab pain   Motrin [Ibuprofen] Other (See Comments)    GI upset- has to eat prior to taking   Fluvastatin Other (See Comments)    mild memoryproblems of confusion     Medications Reviewed Today     Reviewed by Tylene Fantasia, [facility] (Pharmacist) on 10/02/22 at 1511  Med List Status: <None>   Medication Order Taking? Sig Documenting Provider Last Dose Status Informant  0.9 %  sodium chloride infusion [id number]   Arnette Felts, FNP  Active   amLODipine (NORVASC) 10 MG tablet [id number]  Take 1 tablet (10 mg total) by mouth daily. Dorothyann Peng, MD  Active   aspirin EC 81 MG tablet [id number] No Take 81 mg by mouth daily. Swallow whole.  Patient not taking: Reported on 08/29/2022   [provider] Not Taking Active  atorvastatin (LIPITOR) 40 MG tablet [id number]  Take one tablet by mouth on Monday-Friday, skip weekends. Dorothyann Peng, MD  Active   CALCIUM PO [id number] No Take by mouth daily.  Patient not taking: Reported on 08/29/2022   [provider] Not Taking Active   Cholecalciferol (VITAMIN D-3 PO) [id number] No Take 1,000 Units by mouth daily.  Patient not taking: Reported on 08/29/2022   [provider] Not Taking Active   Cyanocobalamin (VITAMIN B-12 PO) [id number] No   Patient not taking: Reported on 08/29/2022   [provider] Not Taking Active   empagliflozin (JARDIANCE) 10 MG TABS tablet [id number] Yes Take 1 tablet (10 mg total) by mouth daily before breakfast. Izora Ribas, Mahesh A, MD  Active   ezetimibe (ZETIA) 10 MG tablet [id number] No Take 1 tablet (10 mg total) by mouth daily. Christell Constant, MD Taking Active   fish oil-omega-3 fatty acids 1000 MG capsule [id number] No Take 2 capsules by mouth daily.  Patient not taking: Reported on 08/29/2022   [provider] Not Taking Active   Fluocinolone Acetonide Scalp 0.01 % OIL [id number] No Apply to affected area of scalp as directed 1-2 x/day as needed for itching Dorothyann Peng, MD Taking Active   GLUCOSAMINE-CHONDROITIN PO [id number] No Take 2 tablets by mouth daily.  Patient not taking: Reported on 08/29/2022   [provider] Not Taking Active   L-Methylfolate-Algae-B12-B6 [facility]) 3-90.314-2-35 MG CAPS [id number] No Take 1 capsule by mouth 2 (two) times daily.   Patient not taking: Reported on 08/29/2022   [provider] Not Taking Active            Med Note Debbe Bales   Tue Aug 19, 2018  9:17 AM)    Magnesium 250 MG TABS [id number] No Take by mouth daily. Tues, Thursday, Saturday   Patient not taking: Reported on 08/29/2022   [provider] Not Taking Active   Menthol, Topical Analgesic, (BIOFREEZE ROLL-ON EX) [id number] No Apply 1 application. topically as needed (pain in back,neck,thumb). [provider] Taking Active   metoprolol tartrate (LOPRESSOR) 50 MG tablet [id number] No Take 2 hours prior to Cardiac CT  Patient not taking: Reported on 08/29/2022   Christell Constant, MD Not Taking Active   nitroGLYCERIN (NITROSTAT) 0.4 MG SL tablet [id number] No Place 1 tablet (0.4 mg total) under the tongue every 5 (five) minutes as needed for chest pain. Leone Brand, NP Taking Active   nystatin (MYCOSTATIN/NYSTOP) powder [id number] No as needed (skin irritation). [provider] Taking Active   Polyvinyl Alcohol-Povidone PF 1.4-0.6 % SOLN [id number] No Place 1 drop into both eyes daily as needed (dry eyes).  [provider] Taking Active   RESTASIS 0.05 % ophthalmic emulsion [id number] No as needed (dry eyes). [provider] Taking Active   TART CHERRY PO [id number] No  [provider] Taking Active   valsartan (DIOVAN) 40 MG tablet [id number]  Take 1 tablet (40 mg total) by mouth daily. Dorothyann Peng, MD  Active   Med List Note Maple Hudson, Rennis Chris, MD 04/12/11 2140): CPAP SMS 7            SDOH:  (Social Determinants of Health) assessments and interventions performed: No SDOH Interventions    Flowsheet Row Office Visit from 06/06/2021 in [facility] Chronic Care Management from 01/03/2021 in [facility] Office Visit from 11/02/2020 in [facility] Clinical Support from 10/28/2019 in [facility] Clinical Support from 10/23/2018 in [facility] Office Visit from 05/15/2018 in [facility]  SDOH Interventions        Food  Insecurity Interventions -- Intervention Not Indicated -- -- -- --  Housing Interventions -- Intervention Not Indicated -- -- -- --  Transportation Interventions -- Intervention Not Indicated -- -- -- --  Depression Interventions/Treatment  Patient refuses Treatment -- Patient refuses Treatment PHQ2-9 Score <4 Follow-up Not Indicated PHQ2-9 Score <4 Follow-up Not Indicated Counseling, Medication  [She will c/w therapy as per [facility] group. She admits that she is overwhelmed w/ the care of her Mother's estate, the care of her brother, and she has had two losses in her family. She is agreeable to possibly starting meds]       Medication Assistance: None required.  Patient affirms current coverage meets needs.  Medication Access: Within the past 30 days, how often has patient missed a dose of medication? 1-2 Is a pillbox or other method used to improve adherence? Yes  Factors that may affect medication adherence? financial need and nonadherence to medications Are meds synced by current pharmacy? No  Are meds delivered by current pharmacy? No  Does patient experience delays in picking up medications due to transportation concerns? No   [facility] Reviewed: Is patient disadvantaged to use [facility]?: No  Current Rx insurance plan: [employer]  Name and location of Current pharmacy:  [facility] - [place], [place] - [place] [address] [place] [address] Phone: [phone] Fax: [phone]  [facility] services reviewed with patient today?: Yes  Patient requests to transfer care to [facility]?: Yes   Compliance/Adherence/Medication fill history: Care Gaps: Mammogram - scheduled for 10/08/2022 COVID-19 Vaccine   Star-Rating Drugs: Jardiance 10 mg tablet once per day - filled once 10/02/2022 - going through grant program  Valsartan 40 mg tablet once per day  Atorvastatin 40 mg tablet daily    Assessment/Plan   Hypertension (BP goal <140/90) -Controlled -Current treatment: Amlodipine 10 mg tablet once per day Appropriate, Effective, Safe, Accessible Valsartan 40 mg tablet once per day Appropriate, Effective, Safe, Accessible  -Current home readings: 130/68, 130/71,131/73, 121/70 -Current dietary habits: oatmeal with banana, nut and oatmeal. Lunch: tuna fish sandwich, dinner: chick fill a sandwich and salad with water to drink  -Current exercise habits: she is going to start exercising again  -Denies hypotensive/hypertensive symptoms -Educated on BP goals and benefits of medications for prevention of heart attack, stroke and kidney damage; Proper BP monitoring technique; Symptoms of hypotension and importance of maintaining adequate hydration; -Counseled to monitor BP at home at least once per day document, and provide log at future appointments -Recommended to continue current medication  Cherylin Mylar, CPP, PharmD Clinical Pharmacist Practitioner [facility] [phone]

## 2022-10-18 ENCOUNTER — Other Ambulatory Visit: Payer: Self-pay | Admitting: Internal Medicine

## 2022-10-18 DIAGNOSIS — E7800 Pure hypercholesterolemia, unspecified: Secondary | ICD-10-CM

## 2022-10-20 ENCOUNTER — Encounter: Payer: Self-pay | Admitting: Physician Assistant

## 2022-10-24 ENCOUNTER — Encounter: Payer: Self-pay | Admitting: Physician Assistant

## 2022-10-24 ENCOUNTER — Ambulatory Visit: Payer: Medicare PPO | Admitting: Physician Assistant

## 2022-10-24 VITALS — BP 146/65 | HR 66 | Resp 20 | Ht 59.0 in | Wt 150.0 lb

## 2022-10-24 DIAGNOSIS — R413 Other amnesia: Secondary | ICD-10-CM

## 2022-10-24 NOTE — Progress Notes (Signed)
Assessment/Plan:   Mariah Burns is a very pleasant 77 y.o. year old RH female with a history of hypertension, hyperlipidemia,  Pre-Diabetes,  arthritis, vitamin D deficiency, OSA on CPAP, sarcoidosis, history of right breast cancer, CKD stage II, depression, chronic diastolic heart failure, seen today for evaluation of memory loss. MoCA today is 20/30.  Workup is in progress.    Memory Impairment  MRI brain without contrast to assess for underlying structural abnormality and assess vascular load  Neurocognitive testing to further evaluate cognitive concerns and determine other underlying cause of memory changes, including potential contribution from sleep, anxiety, or depression  Continue to control mood as per PCP Continue CPAP for OSA Recommend good control of cardiovascular risk factors [place] up in 1 months  Subjective:   The patient is accompanied by her daughter  who supplements the history.   How long did patient have memory difficulties? For the last 1.5 y-per daughter's report.  Patient has some difficulty remembering recent conversations and people's names that she may know and faces.  She is a retired Runner, broadcasting/film/video, and enjoys doing word finding, and other brain activities. repeats oneself?  Endorsed Disoriented when walking into a room?  Patient denies  Leaving objects in unusual places?  Patient denies, but endorsed by her daughter. Accidentally left the phone in the refrigerator (but a while ago).  She lost her glasses and could not find them, had to get new ones. Wandering behavior?  denies   Any personality changes?  Patient denies "but she curbs her condition with laughter and makes fun of herself"-daughter says Any history of depression?:  Endorsed, "many years ago, had psychotherapy "( but she does not elaborate). Daughter reports that she has anxiety, because she takes care of everybody else.  Hallucinations or paranoia?  Patient denies   Seizures?   Patient  denies    Any sleep changes?  Not sleeping well, wakes up at 3-4 then does not go back to sleep easily.  Denies vivid dreams, REM behavior or sleepwalking.   Sleep apnea?  Uses CPAP "just got a new machine". Any hygiene concerns?  Patient denies   Independent of bathing and dressing?  Endorsed  Does the patient needs help with medications? Patient  is in charge, daughter monitors.   Who is in charge of the finances? Daughter is in charge     Any changes in appetite?  "Eating more than before because I am in the house ".      Patient have trouble swallowing? denies   Does the patient cook?  Sometimes, may have forgotten common recipes because "she has not been using them in a long time ". Any kitchen accidents such as leaving the stove on? denies   Any headaches?   denies   Chronic back pain ? denies   Ambulates with difficulty?  denies   Recent falls or head injuries? denies   Vision changes? denies   Unilateral weakness, numbness or tingling? denies   Any tremors?   denies   Any anosmia?  denies   Any incontinence of urine? Goes frequently because "I drink a lot of water ".  Any bowel dysfunction? denies      Patient lives with her daughter History of heavy alcohol intake? denies   History of heavy tobacco use? denies   Family history of dementia? Her sister has dementia.  Does patient drive? 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: failed Imuran & Prednisone therapy d/t adverse side effects TLC 106%, DLCO  75% 2009     Past Surgical History:  Procedure Laterality Date   BREAST LUMPECTOMY WITH RADIOACTIVE SEED AND SENTINEL LYMPH NODE BIOPSY Right 09/02/2018   Procedure: RIGHT BREAST LUMPECTOMY WITH RADIOACTIVE SEED AND  RIGHT SENTINEL LYMPH NODE BIOPSY;  Surgeon: Glenna Fellows, MD;  Location: [facility];  Service: General;  Laterality: Right;   BRONCHOSCOPY     dx'd sarcoid   KNEE ARTHROPLASTY Left 03/13/2017   NEUROPLASTY / TRANSPOSITION ULNAR NERVE AT ELBOW  09/2011   TOTAL ABDOMINAL HYSTERECTOMY       Allergies  Allergen Reactions   Fosamax [Alendronate Sodium] Other (See Comments)    REACTION: "CAUSE GI UPSET AND FATIGUE"   Omeprazole Other (See Comments)    Lower ab pain   Motrin [Ibuprofen] Other (See Comments)    GI upset- has to eat prior to taking   Fluvastatin Other (See Comments)    mild memoryproblems of confusion     Current Outpatient Medications  Medication Instructions   amLODipine (NORVASC) 10 mg, Oral, Daily   aspirin EC 81 mg, Daily   atorvastatin (LIPITOR) 40 MG tablet TAKE ONE TABLET BY MOUTH DAILY EACH MONDAY-FRIDAY. SKIP WEEKENDS.   CALCIUM PO Daily   Cholecalciferol (VITAMIN D-3 PO) 1,000 Units, Daily   Cyanocobalamin (VITAMIN B-12 PO) No dose, route, or frequency recorded.   empagliflozin (JARDIANCE) 10 mg, Oral, Daily before breakfast   ezetimibe (ZETIA) 10 mg, Oral, Daily   fish oil-omega-3 fatty acids 1000 MG capsule 2 capsules, Daily   Fluocinolone Acetonide Scalp 0.01 % OIL Apply to affected area of scalp as directed 1-2 x/day as needed for itching   GLUCOSAMINE-CHONDROITIN PO 2 tablets, Daily   L-Methylfolate-Algae-B12-B6 (METANX) 3-90.314-2-35 MG CAPS 1 capsule, 2 times daily   Magnesium 250 MG TABS Daily   Menthol, Topical Analgesic, (BIOFREEZE ROLL-ON EX) 1 application , Apply externally, As needed   metoprolol tartrate (LOPRESSOR) 50 MG tablet Take 2 hours prior to Cardiac CT   nitroGLYCERIN (NITROSTAT) 0.4 mg, Sublingual,  Every 5 min PRN   nystatin (MYCOSTATIN/NYSTOP) powder As needed   Polyvinyl Alcohol-Povidone PF 1.4-0.6 % SOLN 1 drop, Both Eyes, Daily PRN   RESTASIS 0.05 % ophthalmic emulsion As needed   TART CHERRY PO No dose, route, or frequency recorded.   valsartan (DIOVAN) 40 mg, Oral, Daily     VITALS:   Vitals:   10/24/22 0938 10/24/22 1115  BP: (!) 150/67 (!) 146/65  Pulse: 66   Resp: 20   SpO2: 98%   Weight: 150 lb (68 kg)   Height: 4\' 11"  (1.499 m)       09/24/2022    4:25 PM 01/31/2022   10:08 AM 12/06/2021    9:48 AM 10/05/2021   11:58 AM 06/06/2021   11:09 AM  Depression screen PHQ 2/9  Decreased Interest 1 0 0 0 3  Down, Depressed, Hopeless 3 0 0  0 1  PHQ - 2 Score 4 0 0 0 4  Altered sleeping 3 0  0 3  Tired, decreased energy 3 0  0 2  Change in appetite 3 0  0 3  Feeling bad or failure about yourself  1 0  0 0  Trouble concentrating 3 0  0 3  Moving slowly or fidgety/restless 1 0  0 3  Suicidal thoughts 0 0  0 0  PHQ-9 Score 18 0  0 18  Difficult doing work/chores Somewhat difficult Not difficult at all   Somewhat difficult    PHYSICAL EXAM   HEENT:  Normocephalic, atraumatic. The mucous membranes are moist. The superficial temporal arteries are without ropiness or tenderness. Cardiovascular: Regular rate and rhythm. Lungs: Clear to auscultation bilaterally. Neck: There are no carotid bruits noted bilaterally.  NEUROLOGICAL:    10/24/2022    9:00 AM  Montreal Cognitive Assessment   Visuospatial/ Executive (0/5) 4  Naming (0/3) 3  Attention: Read list of digits (0/2) 2  Attention: Read list of letters (0/1) 1  Attention: Serial 7 subtraction starting at 100 (0/3) 0  Language: Repeat phrase (0/2) 2  Language : Fluency (0/1) 1  Abstraction (0/2) 2  Delayed Recall (0/5) 1  Orientation (0/6) 4  Total 20  Adjusted Score (based on education) 20       05/23/2021   10:21 AM  MMSE - Mini Mental State Exam  Orientation to time 5  Orientation to Place 4   Registration 3  Attention/ Calculation 3  Recall 1  Language- name 2 objects 2  Language- repeat 0  Language- follow 3 step command 3  Language- read & follow direction 1  Write a sentence 1  Copy design 1  Total score 24     Orientation:  Alert and oriented to person, place and time. No aphasia or dysarthria. Fund of knowledge is appropriate. Recent memory impaired and remote memory intact.  Attention and concentration are normal.  Able to name objects and repeat phrases. Delayed recall 1/5  Cranial nerves: There is good facial symmetry. Extraocular muscles are intact and visual fields are full to confrontational testing. Speech is fluent and clear. No tongue deviation. Hearing is intact to conversational tone. Tone: Tone is good throughout. Sensation: Sensation is intact to light touch and pinprick throughout. Vibration is intact at the bilateral big toe.  Coordination: The patient has no difficulty with RAM's or FNF bilaterally. Normal finger to nose  Motor: Strength is 5/5 in the bilateral upper and lower extremities.  She has some degree of pain when rising the right hand, due to arthritis.  There is no pronator drift. There are no fasciculations noted. DTR's: Deep tendon reflexes are 2/4 at the bilateral biceps, triceps, brachioradialis, patella and achilles.  Plantar responses are downgoing bilaterally. Gait and Station: The patient is able to ambulate without difficulty.The patient is able to heel toe walk without any difficulty.The patient is able to ambulate in a tandem fashion. The patient is able to stand in the Romberg position.     Thank you for allowing [place] the opportunity to participate in the care of this nice patient. Please do not hesitate to contact us for any questions or concerns.   Total time spent on today's visit was 61 minutes dedicated to this patient today, preparing to see patient, examining the patient, ordering tests and/or medications and counseling the  patient, documenting clinical information in the EHR or other health record, independently interpreting results  and communicating results to the patient/family, discussing treatment and goals, answering patient's questions and coordinating care.  Cc:  Dorothyann Peng, MD  Marlowe Kays 10/24/2022 12:34 PM

## 2022-10-24 NOTE — Patient Instructions (Addendum)
It was a pleasure to see you today at our office.   Recommendations:  Neurocognitive evaluation at our office   MRI of the brain, the radiology office will call you to arrange you appointment   Continue CPAP Follow up in 1 month    For assessment of decision of mental capacity and competency:  Call Dr. Erick Blinks, geriatric psychiatrist at [phone]  Counseling regarding caregiver distress, including caregiver depression, anxiety and issues regarding community resources, adult day care programs, adult living facilities, or memory care questions:  please contact your  Primary Doctor's Social Worker   Whom to call: Memory  decline, memory medications: Call our office [phone]  For psychiatric meds, mood meds: Please have your primary care physician manage these medications.  If you have any severe symptoms of a stroke, or other severe issues such as confusion,severe chills or fever, etc call 911 or go to the ER as you may need to be evaluated further    RECOMMENDATIONS FOR ALL PATIENTS WITH MEMORY PROBLEMS: 1. Continue to exercise (Recommend 30 minutes of walking everyday, or 3 hours every week) 2. Increase social interactions - continue going to [place] and enjoy social gatherings with friends and family 3. Eat healthy, avoid fried foods and eat more fruits and vegetables 4. Maintain adequate blood pressure, blood sugar, and blood cholesterol level. Reducing the risk of stroke and cardiovascular disease also helps promoting better memory. 5. Avoid stressful situations. Live a simple life and avoid aggravations. Organize your time and prepare for the next day in anticipation. 6. Sleep well, avoid any interruptions of sleep and avoid any distractions in the bedroom that may interfere with adequate sleep quality 7. Avoid sugar, avoid sweets as there is a strong link between excessive sugar intake, diabetes, and cognitive impairment We discussed the Mediterranean diet, which has  been shown to help patients reduce the risk of progressive memory disorders and reduces cardiovascular risk. This includes eating fish, eat fruits and green leafy vegetables, nuts like almonds and hazelnuts, walnuts, and also use olive oil. Avoid fast foods and fried foods as much as possible. Avoid sweets and sugar as sugar use has been linked to worsening of memory function.  There is always a concern of gradual progression of memory problems. If this is the case, then we may need to adjust level of care according to patient needs. Support, both to the patient and caregiver, should then be put into place.      You have been referred for a neuropsychological evaluation (i.e., evaluation of memory and thinking abilities). Please bring someone with you to this appointment if possible, as it is helpful for the doctor to hear from both you and another adult who knows you well. Please bring eyeglasses and hearing aids if you wear them.    The evaluation will take approximately 3 hours and has two parts:   The first part is a clinical interview with the neuropsychologist (Dr. Milbert Coulter or Dr. Roseanne Reno). During the interview, the neuropsychologist will speak with you and the individual you brought to the appointment.    The second part of the evaluation is testing with the doctor's technician Annabelle Harman or Selena Batten). During the testing, the technician will ask you to remember different types of material, solve problems, and answer some questionnaires. Your family member will not be present for this portion of the evaluation.   Please note: We must reserve several hours of the neuropsychologist's time and the psychometrician's time for your evaluation appointment. As  such, there is a No-Show fee of $100. If you are unable to attend any of your appointments, please contact our office as soon as possible to reschedule.    FALL PRECAUTIONS: Be cautious when walking. Scan the area for obstacles that may increase the risk of  trips and falls. When getting up in the mornings, sit up at the edge of the bed for a few minutes before getting out of bed. Consider elevating the bed at the head end to avoid drop of blood pressure when getting up. Walk always in a well-lit room (use night lights in the walls). Avoid area rugs or power cords from appliances in the middle of the walkways. Use a walker or a cane if necessary and consider physical therapy for balance exercise. Get your eyesight checked regularly.  FINANCIAL OVERSIGHT: Supervision, especially oversight when making financial decisions or transactions is also recommended.  HOME SAFETY: Consider the safety of the kitchen when operating appliances like stoves, microwave oven, and blender. Consider having supervision and share cooking responsibilities until no longer able to participate in those. Accidents with firearms and other hazards in the house should be identified and addressed as well.   ABILITY TO BE LEFT ALONE: If patient is unable to contact 911 operator, consider using LifeLine, or when the need is there, arrange for someone to stay with patients. Smoking is a fire hazard, consider supervision or cessation. Risk of wandering should be assessed by caregiver and if detected at any point, supervision and safe proof recommendations should be instituted.  MEDICATION SUPERVISION: Inability to self-administer medication needs to be constantly addressed. Implement a mechanism to ensure safe administration of the medications.     Mediterranean Diet A Mediterranean diet refers to food and lifestyle choices that are based on the traditions of countries located on the [place]. This way of eating has been shown to help prevent certain conditions and improve outcomes for people who have chronic diseases, like kidney disease and heart disease. What are tips for following this plan? Lifestyle  Cook and eat meals together with your family, when possible. Drink  enough fluid to keep your urine clear or pale yellow. Be physically active every day. This includes: Aerobic exercise like running or swimming. Leisure activities like gardening, walking, or housework. Get 7-8 hours of sleep each night. If recommended by your health care provider, drink red wine in moderation. This means 1 glass a day for nonpregnant women and 2 glasses a day for men. A glass of wine equals 5 oz (150 mL). Reading food labels  Check the serving size of packaged foods. For foods such as rice and pasta, the serving size refers to the amount of cooked product, not dry. Check the total fat in packaged foods. Avoid foods that have saturated fat or trans fats. Check the ingredients list for added sugars, such as corn syrup. Shopping  At the grocery store, buy most of your food from the areas near the walls of the store. This includes: Fresh fruits and vegetables (produce). Grains, beans, nuts, and seeds. Some of these may be available in unpackaged forms or large amounts (in bulk). Fresh seafood. Poultry and eggs. Low-fat dairy products. Buy whole ingredients instead of prepackaged foods. Buy fresh fruits and vegetables in-season from local farmers markets. Buy frozen fruits and vegetables in resealable bags. If you do not have access to quality fresh seafood, buy precooked frozen shrimp or canned fish, such as tuna, salmon, or sardines. Buy small amounts of raw  or cooked vegetables, salads, or olives from the deli or salad bar at your store. Stock your pantry so you always have certain foods on hand, such as olive oil, canned tuna, canned tomatoes, rice, pasta, and beans. Cooking  Cook foods with extra-virgin olive oil instead of using butter or other vegetable oils. Have meat as a side dish, and have vegetables or grains as your main dish. This means having meat in small portions or adding small amounts of meat to foods like pasta or stew. Use beans or vegetables instead of meat  in common dishes like chili or lasagna. Experiment with different cooking methods. Try roasting or broiling vegetables instead of steaming or sauteing them. Add frozen vegetables to soups, stews, pasta, or rice. Add nuts or seeds for added healthy fat at each meal. You can add these to yogurt, salads, or vegetable dishes. Marinate fish or vegetables using olive oil, lemon juice, garlic, and fresh herbs. Meal planning  Plan to eat 1 vegetarian meal one day each week. Try to work up to 2 vegetarian meals, if possible. Eat seafood 2 or more times a week. Have healthy snacks readily available, such as: Vegetable sticks with hummus. Greek yogurt. Fruit and nut trail mix. Eat balanced meals throughout the week. This includes: Fruit: 2-3 servings a day Vegetables: 4-5 servings a day Low-fat dairy: 2 servings a day Fish, poultry, or lean meat: 1 serving a day Beans and legumes: 2 or more servings a week Nuts and seeds: 1-2 servings a day Whole grains: 6-8 servings a day Extra-virgin olive oil: 3-4 servings a day Limit red meat and sweets to only a few servings a month What are my food choices? Mediterranean diet Recommended Grains: Whole-grain pasta. Brown rice. Bulgar wheat. Polenta. Couscous. Whole-wheat bread. Orpah Cobb. Vegetables: Artichokes. Beets. Broccoli. Cabbage. Carrots. Eggplant. Green beans. Chard. Kale. Spinach. Onions. Leeks. Peas. Squash. Tomatoes. Peppers. Radishes. Fruits: Apples. Apricots. Avocado. Berries. Bananas. Cherries. Dates. Figs. Grapes. Lemons. Melon. Oranges. Peaches. Plums. Pomegranate. Meats and other protein foods: Beans. Almonds. Sunflower seeds. Pine nuts. Peanuts. Cod. Salmon. Scallops. Shrimp. Tuna. Tilapia. Clams. Oysters. Eggs. Dairy: Low-fat milk. Cheese. Greek yogurt. Beverages: Water. Red wine. Herbal tea. Fats and oils: Extra virgin olive oil. Avocado oil. Grape seed oil. Sweets and desserts: [place] yogurt with honey. Baked apples. Poached  pears. Trail mix. Seasoning and other foods: Basil. Cilantro. Coriander. Cumin. Mint. Parsley. Sage. Rosemary. Tarragon. Garlic. Oregano. Thyme. Pepper. Balsalmic vinegar. Tahini. Hummus. Tomato sauce. Olives. Mushrooms. Limit these Grains: Prepackaged pasta or rice dishes. Prepackaged cereal with added sugar. Vegetables: Deep fried potatoes (french fries). Fruits: Fruit canned in syrup. Meats and other protein foods: Beef. Pork. Lamb. Poultry with skin. Hot dogs. Tomasa Blase. Dairy: Ice cream. Sour cream. Whole milk. Beverages: Juice. Sugar-sweetened soft drinks. Beer. Liquor and spirits. Fats and oils: Butter. Canola oil. Vegetable oil. Beef fat (tallow). Lard. Sweets and desserts: Cookies. Cakes. Pies. Candy. Seasoning and other foods: Mayonnaise. Premade sauces and marinades. The items listed may not be a complete list. Talk with your dietitian about what dietary choices are right for you. Summary The Mediterranean diet includes both food and lifestyle choices. Eat a variety of fresh fruits and vegetables, beans, nuts, seeds, and whole grains. Limit the amount of red meat and sweets that you eat. Talk with your health care provider about whether it is safe for you to drink red wine in moderation. This means 1 glass a day for nonpregnant women and 2 glasses a day for men. A glass  of wine equals 5 oz (150 mL). This information is not intended to replace advice given to you by your health care provider. Make sure you discuss any questions you have with your health care provider. Document Released: 01/12/2016 Document Revised: 02/14/2016 Document Reviewed: 01/12/2016 Elsevier Interactive Patient Education  2017 [employer].    [facility] for MRI [id number]

## 2022-10-25 DIAGNOSIS — C50911 Malignant neoplasm of unspecified site of right female breast: Secondary | ICD-10-CM | POA: Diagnosis not present

## 2022-10-30 ENCOUNTER — Telehealth: Payer: Self-pay

## 2022-10-30 NOTE — Telephone Encounter (Signed)
Faxed signed DME orders from [facility] per Dr. Mosetta Putt for mastectomy products. Received fax confirmation and placed original in the to be scanned file.

## 2022-11-01 ENCOUNTER — Ambulatory Visit: Payer: Medicare PPO | Attending: Internal Medicine | Admitting: Student

## 2022-11-01 ENCOUNTER — Other Ambulatory Visit: Payer: Self-pay

## 2022-11-01 VITALS — BP 128/60 | HR 57

## 2022-11-01 DIAGNOSIS — I5032 Chronic diastolic (congestive) heart failure: Secondary | ICD-10-CM

## 2022-11-01 MED ORDER — VALSARTAN 80 MG PO TABS: 80.0000 mg | ORAL_TABLET | Freq: Every day | ORAL | 3 refills | Status: DC

## 2022-11-01 NOTE — Patient Instructions (Signed)
Changes made by your pharmacist Carmela Hurt, PharmD at today's visit:     Instructions/Changes  (what do you need to do) Your Notes  (what you did and when you did it)  Start taking valsartan 80 mg daily ( either 2 tablets of 40 mg daily or 1 tablet of 40 mg daily) continue taking amlodipine 10 mg daily     2. Continue following low salt diet     Bring all of your meds, your BP cuff and your record of home blood pressures to your next appointment.    HOW TO TAKE YOUR BLOOD PRESSURE AT HOME  Rest 5 minutes before taking your blood pressure.  Don't smoke or drink caffeinated beverages for at least 30 minutes before. Take your blood pressure before (not after) you eat. Sit comfortably with your back supported and both feet on the floor (don't cross your legs). Elevate your arm to heart level on a table or a desk. Use the proper sized cuff. It should fit smoothly and snugly around your bare upper arm. There should be enough room to slip a fingertip under the cuff. The bottom edge of the cuff should be 1 inch above the crease of the elbow. Ideally, take 3 measurements at one sitting and record the average.  Important lifestyle changes to control high blood pressure  Intervention  Effect on the BP  Lose extra pounds and watch your waistline Weight loss is one of the most effective lifestyle changes for controlling blood pressure. If you're overweight or obese, losing even a small amount of weight can help reduce blood pressure. Blood pressure might go down by about 1 millimeter of mercury (mm Hg) with each kilogram (about 2.2 pounds) of weight lost.  Exercise regularly As a general goal, aim for at least 30 minutes of moderate physical activity every day. Regular physical activity can lower high blood pressure by about 5 to 8 mm Hg.  Eat a healthy diet Eating a diet rich in whole grains, fruits, vegetables, and low-fat dairy products and low in saturated fat and cholesterol. A healthy diet  can lower high blood pressure by up to 11 mm Hg.  Reduce salt (sodium) in your diet Even a small reduction of sodium in the diet can improve heart health and reduce high blood pressure by about 5 to 6 mm Hg.  Limit alcohol One drink equals 12 ounces of beer, 5 ounces of wine, or 1.5 ounces of 80-proof liquor.  Limiting alcohol to less than one drink a day for women or two drinks a day for men can help lower blood pressure by about 4 mm Hg.   If you have any questions or concerns please use My Chart to send questions or call the office at [phone]

## 2022-11-01 NOTE — Progress Notes (Signed)
Patient ID: ISRAEL CULL                 DOB: [birth year]                      MRN: [id number]      HPI: Mariah Burns is a 77 y.o. female referred by Dr. Izora Ribas to HTN clinic. PMH is significant for hypertension, pericardial effusion, OSA, T2DM, chronic diastolic heart failure, obesity. At  Pharm D visit on 09/05/2022 BP was controlled so no medications were changed.Last visit with PharmD home BP cuff was validated- was inaccurate, and Jardiance was initiated. Follow up BMP was ordered however patient never went for lab will remind her today for lab.      Current HTN meds: amlodipine 10 mg daily,valsartan 40 mg daily  Previously tried: felodipine 10 mg -dizziness  BP goal: <130/80  Family History:  Mom: heart problems, hypertension, T2DM Dad: died from work place accident  Siblings- sister and brother- T2DM, HTN  2 younger brothers passed away from drug over doses    Social History:  Alcohol: none  Smoking: never    Exercise: none  Was going to Gym regularly, stopped going due to chest discomfort    Home BP readings: ~135- 139/ 65-70 heart rate 60   128/60 heart rate 57  129/59 heart rate 56    Wt Readings from Last 3 Encounters:  10/24/22 150 lb (68 kg)  09/24/22 151 lb (68.5 kg)  08/30/22 151 lb (68.5 kg)   BP Readings from Last 3 Encounters:  10/24/22 (!) 146/65  10/02/22 125/61  09/24/22 128/60   Pulse Readings from Last 3 Encounters:  10/24/22 66  10/02/22 67  09/24/22 70    Renal function: CrCl cannot be calculated (Patient's most recent lab result is older than the maximum 21 days allowed.).  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 90 tablet 2   aspirin EC 81 MG tablet Take 81 mg by mouth daily. Swallow whole. (Patient not taking: Reported on 08/29/2022)     atorvastatin (LIPITOR) 40 MG tablet TAKE ONE TABLET BY MOUTH DAILY EACH MONDAY-FRIDAY. SKIP WEEKENDS. 90 tablet 1   CALCIUM PO Take by mouth daily. (Patient not taking: Reported on 08/29/2022)     Cholecalciferol (VITAMIN D-3 PO) Take 1,000 Units by mouth daily. (Patient not taking: Reported on 08/29/2022)     Cyanocobalamin (VITAMIN B-12 PO)  (Patient not taking: Reported on 08/29/2022)     empagliflozin (JARDIANCE) 10 MG TABS tablet Take 1 tablet (10 mg total) by mouth daily before breakfast. 30 tablet 11   ezetimibe (ZETIA) 10 MG tablet Take 1 tablet (10 mg total) by mouth daily. 30 tablet 10   fish oil-omega-3 fatty acids 1000 MG capsule Take 2 capsules by mouth daily. (Patient not taking: Reported on 08/29/2022)     Fluocinolone Acetonide Scalp 0.01 %  OIL Apply to affected area of scalp as directed 1-2 x/day as needed for itching 118.28 mL 1   GLUCOSAMINE-CHONDROITIN PO Take 2 tablets by mouth daily. (Patient not taking: Reported on 08/29/2022)     L-Methylfolate-Algae-B12-B6 (METANX) 3-90.314-2-35 MG CAPS Take 1 capsule by mouth 2 (two) times daily.  (Patient not taking: Reported on 08/29/2022)     Magnesium 250 MG TABS Take by mouth daily. Tues, Thursday, Saturday (Patient not taking: Reported on 08/29/2022)     Menthol, Topical Analgesic, (BIOFREEZE ROLL-ON EX) Apply 1 application. topically as needed (pain in back,neck,thumb).     metoprolol tartrate (LOPRESSOR) 50 MG tablet Take 2 hours prior to Cardiac CT (Patient not taking: Reported on 08/29/2022) 1 tablet 0   nitroGLYCERIN (NITROSTAT) 0.4 MG SL tablet Place 1 tablet (0.4 mg total) under the tongue every 5 (five)  minutes as needed for chest pain. 25 tablet 2   nystatin (MYCOSTATIN/NYSTOP) powder as needed (skin irritation).     Polyvinyl Alcohol-Povidone PF 1.4-0.6 % SOLN Place 1 drop into both eyes daily as needed (dry eyes).      RESTASIS 0.05 % ophthalmic emulsion as needed (dry eyes).     TART CHERRY PO      valsartan (DIOVAN) 40 MG tablet Take 1 tablet (40 mg total) by mouth daily. 90 tablet 1   Current Facility-Administered Medications on File Prior to Visit  Medication Dose Route Frequency Provider Last Rate Last Admin   0.9 %  sodium chloride infusion   Intravenous PRN Arnette Felts, FNP        Allergies  Allergen Reactions   Fosamax [Alendronate Sodium] Other (See Comments)    REACTION: "CAUSE GI UPSET AND FATIGUE"   Omeprazole Other (See Comments)    Lower ab pain   Motrin [Ibuprofen] Other (See Comments)    GI upset- has to eat prior to taking   Fluvastatin Other (See Comments)    mild memoryproblems of confusion     There were no vitals taken for this visit.   No problem-specific Assessment & Plan notes found for this encounter.      Thank you  Carmela Hurt, Pharm.D [facility] Division of [facility] [address] [place], [place] [address]  Phone: [phone]; Fax: [phone]

## 2022-11-02 NOTE — Telephone Encounter (Signed)
Called spoke with pt in regards to stopping exercise d/t SOB.  Pt reports notes palpitations while walking.  Stops, sits down once palpitations stop she starts back. Advised pt of MD recommendation for Cardiac PET Scan.  Pt expressed you want me to decide to have this test.  Pt daughter helps with care.  Advised pt will send a my chart message for daughter to review with information given to pt.  Pt and daughter will discuss and let me know their thoughts.

## 2022-11-06 ENCOUNTER — Encounter: Payer: Self-pay | Admitting: Student

## 2022-11-06 ENCOUNTER — Telehealth: Payer: Self-pay

## 2022-11-06 NOTE — Progress Notes (Addendum)
Care Management & Coordination Services Pharmacy Team  Reason for Encounter: Medication coordination and delivery  Contacted patient to discuss medications and coordinate delivery from [facility]. Spoke with patient on 11/06/2022  Cycle dispensing form sent to Billee Cashing for review.   Last adherence delivery date: None  Patient is due for next adherence delivery on: 11-16-2022  This delivery to include: Vials  30 Days  Amlodipine 10 mg with EM Atorvastatin 40 mg M-F at B Zetia 10 mg at B Nitrostat 0.4 mg PRN in vial  Patient declined the following medications this month: Valsartan- Filled 05-30 [facility] Jardiance- Filled at [facility]. Due 07-05 informed chasity rx is at [facility] [address] for transfer.  No refill request needed.  Confirmed delivery date of 11-16-2022, advised patient that pharmacy will contact them the morning of delivery.  Any concerns about your medications? Yes Patient stated she filled Jardiance in April but never started medication because she was concerned about the side effects. Patient also wanted to know why jardiance was prescribed. Informed patient med was prescribed because A1C level increased. Patient will start taking jardiance once before breakfast. Will contact patient in 1 week to follow up.  How often do you forget or accidentally miss a dose? Never  Do you use a pillbox? Yes  Is patient in packaging Yes  Recent blood pressure readings are as follows: 06-04 136/65 56, 06-03 131/63 56, 06-02 127/55 51  Recent blood glucose readings are as follows: None   Chart review: Recent office visits:  None  Recent consult visits:  11-01-2022 Carmela Hurt (Cardiology). Follow up visit for BP. Increase valsartan 40 mg daily to 80 mg daily.  10-24-2022 Elwyn Reach (Neurology). Visit for memory impairment. MRI brain completed and referral placed to neuropsychology.  Hospital visits:  Medication Reconciliation was  completed by comparing discharge summary, patient's EMR and Pharmacy list, and upon discussion with patient.   Admitted to the hospital on 07-24-2022 due to chest pain. Discharge date was 07-24-2022. Discharged from [facility] at [facility]     New?Medications Started at [facility] Discharge:?? None   Medication Changes at Hospital Discharge: None   Medications Discontinued at Hospital Discharge: None   Medications that remain the same after Hospital Discharge:??  -All other medications will remain the same.    Medications: Outpatient Encounter Medications as of 11/06/2022  Medication Sig   amLODipine (NORVASC) 10 MG tablet Take 1 tablet (10 mg total) by mouth daily.   aspirin EC 81 MG tablet Take 81 mg by mouth daily. Swallow whole. (Patient not taking: Reported on 08/29/2022)   atorvastatin (LIPITOR) 40 MG tablet TAKE ONE TABLET BY MOUTH DAILY EACH MONDAY-FRIDAY. SKIP WEEKENDS.   CALCIUM PO Take by mouth daily. (Patient not taking: Reported on 08/29/2022)   Cholecalciferol (VITAMIN D-3 PO) Take 1,000 Units by mouth daily. (Patient not taking: Reported on 08/29/2022)   Cyanocobalamin (VITAMIN B-12 PO)  (Patient not taking: Reported on 08/29/2022)   empagliflozin (JARDIANCE) 10 MG TABS tablet Take 1 tablet (10 mg total) by mouth daily before breakfast.   ezetimibe (ZETIA) 10 MG tablet Take 1 tablet (10 mg total) by mouth daily.   fish oil-omega-3 fatty acids 1000 MG capsule Take 2 capsules by mouth daily. (Patient not taking: Reported on 08/29/2022)   Fluocinolone Acetonide Scalp 0.01 % OIL Apply to affected area of scalp as directed 1-2 x/day as needed for itching   GLUCOSAMINE-CHONDROITIN PO Take 2 tablets by mouth daily. (Patient  not taking: Reported on 08/29/2022)   L-Methylfolate-Algae-B12-B6 (METANX) 3-90.314-2-35 MG CAPS Take 1 capsule by mouth 2 (two) times daily.  (Patient not taking: Reported on 08/29/2022)   Magnesium 250 MG TABS Take by mouth daily. Tues, Thursday,  Saturday (Patient not taking: Reported on 08/29/2022)   Menthol, Topical Analgesic, (BIOFREEZE ROLL-ON EX) Apply 1 application. topically as needed (pain in back,neck,thumb).   metoprolol tartrate (LOPRESSOR) 50 MG tablet Take 2 hours prior to Cardiac CT (Patient not taking: Reported on 08/29/2022)   nitroGLYCERIN (NITROSTAT) 0.4 MG SL tablet Place 1 tablet (0.4 mg total) under the tongue every 5 (five) minutes as needed for chest pain.   nystatin (MYCOSTATIN/NYSTOP) powder as needed (skin irritation).   Polyvinyl Alcohol-Povidone PF 1.4-0.6 % SOLN Place 1 drop into both eyes daily as needed (dry eyes).    RESTASIS 0.05 % ophthalmic emulsion as needed (dry eyes).   TART CHERRY PO    valsartan (DIOVAN) 80 MG tablet Take 1 tablet (80 mg total) by mouth daily.   Facility-Administered Encounter Medications as of 11/06/2022  Medication   0.9 %  sodium chloride infusion   BP Readings from Last 3 Encounters:  11/01/22 128/60  10/24/22 (!) 146/65  10/02/22 125/61    Pulse Readings from Last 3 Encounters:  11/01/22 (!) 57  10/24/22 66  10/02/22 67    Lab Results  Component Value Date/Time   HGBA1C 6.2 (H) 09/24/2022 05:07 PM   HGBA1C 5.9 (H) 03/13/2022 02:02 PM   Lab Results  Component Value Date   CREATININE 0.88 08/29/2022   BUN 12 08/29/2022   GFRNONAA >60 08/29/2022   GFRAA 85 05/26/2020   NA 142 08/29/2022   K 3.6 08/29/2022   CALCIUM 9.8 08/29/2022   CO2 31 08/29/2022    Malecca Hicks CMA Clinical Pharmacist Assistant [phone]

## 2022-11-06 NOTE — Telephone Encounter (Signed)
Spoke with [name] and she reports that she is concerned about her mothers cognitive decline.  She reports that she is unsure of whether her mother will take her medication properly in vials. HC to call daily for medication adherence to help Mariah Burns with morning medications around 9 AM. HC to call daughter [name] tomorrow to introduce herself and to reassure assistance with medication regimen  Cherylin Mylar, CPP, PharmD Clinical Pharmacist Practitioner [facility] [phone]

## 2022-11-07 ENCOUNTER — Telehealth: Payer: Self-pay

## 2022-11-07 NOTE — Progress Notes (Cosign Needed Addendum)
11-07-2022: Contacted patient to confirm her medication adherence for today. Patient took Jardiance before breakfast, other medications at breakfast Valsartan 80 mg, Zetia 10, D3, aspirin 81 mg and will take Amlodipine 10 mg with dinner. Contacted daughter Gates Rigg and she requested a call back tomorrow around 10 am.   11-08-2022: Contacted patient to confirm her medication adherence for today. Patient took Jardiance before breakfast, other medications at breakfast Valsartan 80 mg, Zetia 10, D3, aspirin 81 mg and will take Amlodipine 10 mg with dinner. Daughter was on call as well and has an understanding of all medications.  11-09-2022: left patient VM to confirm her medication adherence for today.  Huey Romans [facility] Clinical Pharmacist Assistant [phone]

## 2022-11-08 ENCOUNTER — Encounter: Payer: Self-pay | Admitting: Physician Assistant

## 2022-11-13 ENCOUNTER — Ambulatory Visit: Payer: Medicare PPO | Attending: Internal Medicine

## 2022-11-13 ENCOUNTER — Ambulatory Visit
Admission: RE | Admit: 2022-11-13 | Discharge: 2022-11-13 | Disposition: A | Discharge status: Home / Self Care | Payer: Medicare PPO | Source: Ambulatory Visit | Attending: Physician Assistant | Admitting: Physician Assistant

## 2022-11-13 DIAGNOSIS — E785 Hyperlipidemia, unspecified: Secondary | ICD-10-CM | POA: Diagnosis not present

## 2022-11-13 DIAGNOSIS — I6782 Cerebral ischemia: Secondary | ICD-10-CM | POA: Diagnosis not present

## 2022-11-13 DIAGNOSIS — R413 Other amnesia: Secondary | ICD-10-CM | POA: Diagnosis not present

## 2022-11-13 DIAGNOSIS — G319 Degenerative disease of nervous system, unspecified: Secondary | ICD-10-CM | POA: Diagnosis not present

## 2022-11-13 NOTE — Progress Notes (Signed)
11-13-2022: Contacted patient to confirm her medication adherence for today. Patient stated she had labs done today so she will take medications in a few.

## 2022-11-14 LAB — LIPID PANEL
Chol/HDL Ratio: 2.2 ratio (ref 0.0–4.4)
Cholesterol, Total: 119 mg/dL (ref 100–199)
HDL: 53 mg/dL (ref >39)
LDL Chol Calc (NIH): 53 mg/dL (ref 0–99)
Triglycerides: 57 mg/dL (ref 0–149)
VLDL Cholesterol Cal: 13 mg/dL (ref 5–40)

## 2022-11-14 NOTE — Progress Notes (Signed)
MRI brain shows similar findings to those of 2 years ago, no acute findings. Some age related changes seen, thanks

## 2022-11-14 NOTE — Progress Notes (Signed)
Call can't go thru at 2:50 11/14/2022

## 2022-11-14 NOTE — Telephone Encounter (Signed)
Pt has an OV 11/15/22 will review instructions for testing at OV.

## 2022-11-15 ENCOUNTER — Ambulatory Visit: Payer: Medicare PPO | Attending: Internal Medicine | Admitting: Internal Medicine

## 2022-11-15 ENCOUNTER — Encounter: Payer: Self-pay | Admitting: Internal Medicine

## 2022-11-15 VITALS — BP 126/60 | HR 60 | Ht 59.0 in | Wt 149.6 lb

## 2022-11-15 DIAGNOSIS — E785 Hyperlipidemia, unspecified: Secondary | ICD-10-CM

## 2022-11-15 DIAGNOSIS — I071 Rheumatic tricuspid insufficiency: Secondary | ICD-10-CM

## 2022-11-15 DIAGNOSIS — G4733 Obstructive sleep apnea (adult) (pediatric): Secondary | ICD-10-CM | POA: Diagnosis not present

## 2022-11-15 DIAGNOSIS — I3139 Other pericardial effusion (noninflammatory): Secondary | ICD-10-CM | POA: Diagnosis not present

## 2022-11-15 DIAGNOSIS — I10 Essential (primary) hypertension: Secondary | ICD-10-CM

## 2022-11-15 DIAGNOSIS — I5032 Chronic diastolic (congestive) heart failure: Secondary | ICD-10-CM

## 2022-11-15 DIAGNOSIS — D860 Sarcoidosis of lung: Secondary | ICD-10-CM

## 2022-11-15 NOTE — Progress Notes (Signed)
Cardiology Office Note:    Date:  11/15/2022   ID:  Mariah Burns, DOB 1945/08/14, MRN [id number]  PCP:  Dorothyann Peng, MD   [facility] Providers Cardiologist:  Christell Constant, MD     Referring MD: Dorothyann Peng, MD   CC: Follow up chest pain   History of Present Illness:    Mariah Burns is a 77 y.o. female with a hx of HTN, HFpEF, pulmonary sarcoidosis, and OSA on CPAP. History of breast cancer with 2020 seed radiation, but without cardiotoxic chemotherapy or immuno therapy. 2020: Echo shows elevated LA pressure, small, circumferential pericardial effusion, an Moderate central tricuspid regurgitation without RV dilation or dysfunction. 2024: New CP, meds changes to improve BP; patient and daugther were not sure if they wanted PET MPI.  Stopped doing exercise for CP  BP cuff was validated.  Started on Valsartan   Patient notes that she is doing well .   LDL is now at goal.  There are no interval hospital/ED visit.    No chest pain or pressure.  No SOB/DOE and no PND/Orthopnea.  No weight gain or leg swelling.    She notes her heart rate is going faster when exercises.  She clarifies that her didn't truly have pain it was just heart hear beating faster.   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: failed Imuran & Prednisone therapy d/t adverse side effects TLC 106%, DLCO 75% 2009    Past Surgical History:  Procedure Laterality Date   BREAST LUMPECTOMY WITH RADIOACTIVE SEED AND SENTINEL LYMPH NODE BIOPSY Right 09/02/2018   Procedure: RIGHT BREAST LUMPECTOMY WITH RADIOACTIVE SEED AND  RIGHT SENTINEL LYMPH NODE BIOPSY;  Surgeon: Glenna Fellows, MD;  Location: [facility];  Service:  General;  Laterality: Right;   BRONCHOSCOPY     dx'd sarcoid   KNEE ARTHROPLASTY Left 03/13/2017   NEUROPLASTY / TRANSPOSITION ULNAR NERVE AT ELBOW  09/2011   TOTAL ABDOMINAL HYSTERECTOMY      Current Medications: Current Meds  Medication Sig   amLODipine (NORVASC) 10 MG tablet Take 1 tablet (10 mg total) by mouth daily.   ascorbic acid (VITAMIN C) 500 MG tablet Take 500 mg by mouth daily.   aspirin EC 81 MG tablet Take 81 mg by mouth daily. Swallow whole.   atorvastatin (LIPITOR) 40 MG tablet TAKE ONE TABLET BY MOUTH DAILY EACH MONDAY-FRIDAY. SKIP WEEKENDS.   Cholecalciferol (VITAMIN D-3 PO) Take 1,000 Units by mouth daily.   empagliflozin (JARDIANCE) 10 MG TABS tablet Take 1 tablet (10 mg total) by mouth daily before breakfast.   ezetimibe (ZETIA) 10 MG tablet Take 1 tablet (10 mg total) by mouth daily.   Fluocinolone Acetonide Scalp 0.01 % OIL Apply to affected area of scalp as directed 1-2 x/day as needed for itching   Menthol, Topical Analgesic, (BIOFREEZE ROLL-ON EX) Apply 1 application. topically as needed (pain in back,neck,thumb).   nitroGLYCERIN (NITROSTAT) 0.4 MG SL tablet Place 1 tablet (0.4 mg total) under the tongue every 5 (five) minutes as needed for chest pain.   nystatin (MYCOSTATIN/NYSTOP) powder as needed (skin irritation).   Polyvinyl Alcohol-Povidone PF 1.4-0.6 % SOLN  Place 1 drop into both eyes daily as needed (dry eyes).    RESTASIS 0.05 % ophthalmic emulsion as needed (dry eyes).   TART CHERRY PO daily at 6 (six) AM.   valsartan (DIOVAN) 80 MG tablet Take 1 tablet (80 mg total) by mouth daily.   Current Facility-Administered Medications for the 11/15/22 encounter (Office Visit) with Riley Lam A, MD  Medication   0.9 %  sodium chloride infusion     Allergies:   Fosamax [alendronate sodium], Omeprazole, Motrin [ibuprofen], and Fluvastatin   Social History   Socioeconomic History   Marital status: Divorced    Spouse name: Not on file   Number of  children: 3   Years of education: College   Highest education level: Not on file  Occupational History   Occupation: Retired    Associate Professor: RETIRED  Tobacco Use   Smoking status: Never    Passive exposure: Never   Smokeless tobacco: Never  Vaping Use   Vaping Use: Never used  Substance and Sexual Activity   Alcohol use: No   Drug use: No   Sexual activity: Not Currently  Other Topics Concern   Not on file  Social History Narrative   05/23/21 Patient lives at home with youngest daughter.   Caffeine Use: none   Right handed   One story home   retired   Chemical engineer Strain: Low Risk  (12/06/2021)   Overall Financial Resource Strain (CARDIA)    Difficulty of Paying Living Expenses: Not hard at all  Food Insecurity: No Food Insecurity (12/06/2021)   Hunger Vital Sign    Worried About Running Out of Food in the Last Year: Never true    Ran Out of Food in the Last Year: Never true  Transportation Needs: No Transportation Needs (12/06/2021)   PRAPARE - Administrator, Civil Service (Medical): No    Lack of Transportation (Non-Medical): No  Physical Activity: Sufficiently Active (12/06/2021)   Exercise Vital Sign    Days of Exercise per Week: 5 days    Minutes of Exercise per Session: 60 min  Stress: No Stress Concern Present (12/06/2021)   [employer] of Occupational Health - Occupational Stress Questionnaire    Feeling of Stress : Not at all  Social Connections: Not on file     Family History: The patient's family history includes Allergies in her daughter, mother, and sister; Gout in her brother and brother; Healthy in her daughter and daughter; Heart disease in her brother, sister, and sister; Heart failure in her father; Hypertension in her brother, brother, brother, and brother; Lupus in her sister; Post-traumatic stress disorder in her brother.  ROS:   Please see the history of present illness.     EKGs/Labs/Other Studies  Reviewed:    The following studies were reviewed today:  EKG:   07/27/22: SR rate 67 with RBBB and artifact  Cardiac Studies & Procedures       ECHOCARDIOGRAM  ECHOCARDIOGRAM COMPLETE 08/23/2022  Narrative ECHOCARDIOGRAM REPORT    Patient Name:   Mariah Burns Date of Exam: 08/23/2022 Medical Rec #:  [id number]         Height:       59.0 in Accession #:    [id number]        Weight:       147.0 lb Date of Birth:  1945-10-05        BSA:  1.618 m Patient Age:    76 years          BP:           148/70 mmHg Patient Gender: F                 HR:           66 bpm. Exam Location:  [address]  Procedure: 2D Echo, Cardiac Doppler and Color Doppler  Indications:    I07.1 Tricuspid Valve Insufficiency.  History:        Patient has prior history of Echocardiogram examinations, most recent 02/13/2019. Risk Factors:Hypertension, Diabetes, Dyslipidemia and Pre-diabetes. Obstructive sleep apnea. Sarcoidosis.  Sonographer:    Daphine Deutscher RDCS Referring Phys: [id number] [facility] A Arneta Mahmood  IMPRESSIONS   1. Left ventricular ejection fraction, by estimation, is >75%. The left ventricle has hyperdynamic function. The left ventricle has no regional wall motion abnormalities. Left ventricular diastolic parameters are consistent with Grade I diastolic dysfunction (impaired relaxation). Elevated left atrial pressure. 2. Right ventricular systolic function is normal. The right ventricular size is normal. 3. The mitral valve is normal in structure. Trivial mitral valve regurgitation. No evidence of mitral stenosis. 4. The aortic valve is tricuspid. Aortic valve regurgitation is trivial. Aortic valve sclerosis is present, with no evidence of aortic valve stenosis. 5. The inferior vena cava is normal in size with greater than 50% respiratory variability, suggesting right atrial pressure of 3 mmHg.  FINDINGS Left Ventricle: Left ventricular ejection fraction, by estimation,  is >75%. The left ventricle has hyperdynamic function. The left ventricle has no regional wall motion abnormalities. The left ventricular internal cavity size was normal in size. There is no left ventricular hypertrophy. Left ventricular diastolic parameters are consistent with Grade I diastolic dysfunction (impaired relaxation). Elevated left atrial pressure.  Right Ventricle: The right ventricular size is normal. Right ventricular systolic function is normal.  Left Atrium: Left atrial size was normal in size.  Right Atrium: Right atrial size was normal in size.  Pericardium: Trivial pericardial effusion is present.  Mitral Valve: The mitral valve is normal in structure. Trivial mitral valve regurgitation. No evidence of mitral valve stenosis.  Tricuspid Valve: The tricuspid valve is normal in structure. Tricuspid valve regurgitation is mild . No evidence of tricuspid stenosis.  Aortic Valve: The aortic valve is tricuspid. Aortic valve regurgitation is trivial. Aortic valve sclerosis is present, with no evidence of aortic valve stenosis.  Pulmonic Valve: The pulmonic valve was normal in structure. Pulmonic valve regurgitation is trivial. No evidence of pulmonic stenosis.  Aorta: The aortic root is normal in size and structure.  Venous: The inferior vena cava is normal in size with greater than 50% respiratory variability, suggesting right atrial pressure of 3 mmHg.  IAS/Shunts: No atrial level shunt detected by color flow Doppler.   LEFT VENTRICLE PLAX 2D LVIDd:         4.00 cm   Diastology LVIDs:         2.20 cm   LV e' medial:    4.79 cm/s LV PW:         1.00 cm   LV E/e' medial:  20.2 LV IVS:        1.00 cm   LV e' lateral:   5.71 cm/s LVOT diam:     1.60 cm   LV E/e' lateral: 16.9 LV SV:         59 LV SV Index:   36 LVOT Area:  2.01 cm   RIGHT VENTRICLE             IVC RV Basal diam:  3.20 cm     IVC diam: 1.30 cm RV S prime:     13.70 cm/s TAPSE (M-mode): 1.9  cm  LEFT ATRIUM             Index        RIGHT ATRIUM          Index LA diam:        4.80 cm 2.97 cm/m   RA Area:     9.83 cm LA Vol (A2C):   38.6 ml 23.85 ml/m  RA Volume:   19.30 ml 11.93 ml/m LA Vol (A4C):   20.6 ml 12.73 ml/m LA Biplane Vol: 28.7 ml 17.74 ml/m AORTIC VALVE LVOT Vmax:   126.50 cm/s LVOT Vmean:  83.400 cm/s LVOT VTI:    0.291 m  AORTA Ao Root diam: 3.00 cm Ao Asc diam:  3.30 cm  MITRAL VALVE                TRICUSPID VALVE MV Area (PHT): 2.79 cm     TR Peak grad:   32.5 mmHg MV Decel Time: 272 msec     TR Vmax:        285.00 cm/s MV E velocity: 96.70 cm/s MV A velocity: 111.50 cm/s  SHUNTS MV E/A ratio:  0.87         Systemic VTI:  0.29 m Systemic Diam: 1.60 cm  Olga Millers MD Electronically signed by Olga Millers MD Signature Date/Time: 08/23/2022/3:03:41 PM    Final     CT SCANS  CT CORONARY MORPH W/CTA COR W/SCORE 08/17/2022  Addendum 08/17/2022  9:25 AM ADDENDUM REPORT: 08/17/2022 09:22  ADDENDUM: OVER-READ INTERPRETATION  CT CHEST  The following report is an over-read performed by radiologist Dr. Thora Lance III MD of [facility]. This over-read does not include interpretation of cardiac or coronary anatomy or pathology. The CTA interpretation by cardiologist is attached.  Trace pericardial fluid. No mediastinal mass or adenopathy localized. Small hiatal hernia. No pleural effusion. No pneumothorax. Visualized portions of lung fields are clear. Visualized upper abdomen unremarkable. Regional bones unremarkable.  IMPRESSION: No acute findings.   Electronically Signed By: Corlis Leak M.D. On: 08/17/2022 09:22  Narrative CLINICAL DATA:  77 yo female with chest pain  EXAM: Cardiac/Coronary CTA  TECHNIQUE: A non-contrast, gated CT scan was obtained with axial slices of 3 mm through the heart for calcium scoring. Calcium scoring was performed using the Agatston method. A 120 kV prospective, gated,  contrast cardiac scan was obtained. Gantry rotation speed was 250 msecs and collimation was 0.6 mm. Two sublingual nitroglycerin tablets (0.8 mg) were given. The 3D data set was reconstructed in 5% intervals of the 35-75% of the R-R cycle. Diastolic phases were analyzed on a dedicated workstation using MPR, MIP, and VRT modes. The patient received 95 cc of contrast.  FINDINGS: Image quality: Average.  Noise artifact is: Limited. (mis-registration).  Coronary Arteries:  Normal coronary origin.  Right dominance.  Left main: The left main is a large caliber vessel with a normal take off from the left coronary cusp that trifurcates into a LAD, LCX, and ramus intermedius. There is no plaque or stenosis.  Left anterior descending artery: The LAD has minimal (0-24) calcified plaque in the ostium and mild (25-49) soft plaque at takeoff off D1. The LAD gives off 2 small, patent diagonal  branches.  Ramus intermedius: Patent with no evidence of plaque or stenosis.  Left circumflex artery: The LCX is non-dominant and patent with no evidence of plaque or stenosis (mid and distal vessel somewhat difficult to assess). The LCX gives off small OM1 and larger OM2.  Right coronary artery: The RCA is dominant with normal take off from the right coronary cusp. There is no evidence of plaque or stenosis. The RCA terminates as a PDA and right posterolateral branch without evidence of plaque or stenosis.  Right Atrium: Right atrial size is within normal limits.  Right Ventricle: The right ventricular cavity is within normal limits.  Left Atrium: Left atrial size is normal in size with no left atrial appendage filling defect.  Left Ventricle: The ventricular cavity size is within normal limits. There are no stigmata of prior infarction. There is no abnormal filling defect.  Pulmonary arteries: Mildly dilated (3.2 cm).  Pulmonary veins: Normal pulmonary venous drainage.  Pericardium: Normal  thickness with no significant effusion or calcium present.  Cardiac valves: The aortic valve is trileaflet without significant calcification. The mitral valve is normal structure without significant calcification.  Aorta: Normal caliber with aortic atherosclerosis.  Extra-cardiac findings: See attached radiology report for non-cardiac structures.  IMPRESSION: 1. Coronary calcium score of 51.4. This was 66 percentile for age-, sex, and race-matched controls.  2. Normal coronary origin with right dominance.  3. Mild (25-49) soft plaque in the LAD at takeoff of diagonal.  4. Aortic atherosclerosis.  5. Mildly dilated pulmonary artery (3.2 cm) suggestive of pulmonary hypertension.  RECOMMENDATIONS: CAD-RADS 2: Mild non-obstructive CAD (25-49%). Consider non-atherosclerotic causes of chest pain. Consider preventive therapy and risk factor modification.  Olga Millers, MD  Electronically Signed: By: Olga Millers M.D. On: 08/14/2022 14:15           Recent Labs: 08/29/2022: ALT 14; BUN 12; Creatinine 0.88; Potassium 3.6; Sodium 142 09/24/2022: Hemoglobin 12.2; Platelets 201; TSH 0.742  Recent Lipid Panel    Component Value Date/Time   CHOL 119 11/13/2022 0920   TRIG 57 11/13/2022 0920   HDL 53 11/13/2022 0920   CHOLHDL 2.2 11/13/2022 0920   LDLCALC 53 11/13/2022 0920   Physical Exam:    VS:  BP 126/60   Pulse 60   Ht 4\' 11"  (1.499 m)   Wt 149 lb 9.6 oz (67.9 kg)   SpO2 96%   BMI 30.22 kg/m     Wt Readings from Last 3 Encounters:  11/15/22 149 lb 9.6 oz (67.9 kg)  10/24/22 150 lb (68 kg)  09/24/22 151 lb (68.5 kg)    GEN:  Well nourished, well developed in no acute distress HEENT: Normal NECK: No JVD CARDIAC: RRR, soft systolic murmurs no rubs, gallops RESPIRATORY:  Clear to auscultation without rales, wheezing or rhonchi  ABDOMEN: Soft, non-tender, non-distended MUSCULOSKELETAL:  Trace left  edema; No deformity  SKIN: Warm and dry NEUROLOGIC:  Alert  and oriented x 3 PSYCHIATRIC:  Normal affect   ASSESSMENT:    1. Chronic diastolic heart failure (HCC)   2. Pulmonary sarcoidosis (HCC)   3. Essential hypertension   4. Pericardial effusion   5. Hyperlipidemia with target LDL less than 70   6. Tricuspid valve insufficiency, unspecified etiology   7. Obstructive sleep apnea     PLAN:     Heart racing with exertion Mild non obstructive CAD - no evidence of obstructive disease, in person sounds less like microvascular disease - has PRN nitro  - on ASA 81  mg PO daily - SDM: she will walk with family and see if she actually has chest pain: if none at her 7/10 follow up, I will write a letter discussing her risks to proceed back to the gym; if angina; will do PET MPI for   Hypertension - continue norvasc 10 mg - continue valsartan 80 mg  - start amlodipine 5 mg - Pharm D BP f/u in July will likely be the last  HLD R breast cancer s/p chest radiation - LDL on goal with current statin   Mild tricuspid regurgitation - with hx of OSA with questionable compliance, normal RV function and pulmonary sarcoidosis - improved in 2024  Pericardial effusion - trivial   RBBB -Monitor  Time Spent Directly with Patient:   I have spent a total of 40 minutes with the patient reviewing notes, imaging, EKGs, labs and examining the patient as well as establishing an assessment and plan that was discussed personally with the patient.  > 50% of time was spent in direct patient care and family (both present and teleconferenced in)  and reviewing imaging with patient.          Medication Adjustments/Labs and Tests Ordered: Current medicines are reviewed at length with the patient today.  Concerns regarding medicines are outlined above.  No orders of the defined types were placed in this encounter.  No orders of the defined types were placed in this encounter.   Patient Instructions  Medication Instructions:  Your physician recommends  that you continue on your current medications as directed. Please refer to the Current Medication list given to you today.  *If you need a refill on your cardiac medications before your next appointment, please call your pharmacy*   Lab Work: None  If you have labs (blood work) drawn today and your tests are completely normal, you will receive your results only by: MyChart Message (if you have MyChart) OR A paper copy in the mail If you have any lab test that is abnormal or we need to change your treatment, we will call you to review the results.   Testing/Procedures: None   Follow-Up: At [facility], you and your health needs are our priority.  As part of our continuing mission to provide you with exceptional heart care, we have created designated Provider Care Teams.  These Care Teams include your primary Cardiologist (physician) and Advanced Practice Providers (APPs -  Physician Assistants and Nurse Practitioners) who all work together to provide you with the care you need, when you need it.  Your next appointment:   6 month(s)  Provider:   Jari Favre, PA-C, Robin Searing, NP, Jacolyn Reedy, PA-C, Eligha Bridegroom, NP, or Tereso Newcomer, PA-C        Signed, Christell Constant, MD  11/15/2022 1:26 PM    [facility]

## 2022-11-15 NOTE — Patient Instructions (Signed)
Medication Instructions:  Your physician recommends that you continue on your current medications as directed. Please refer to the Current Medication list given to you today.  *If you need a refill on your cardiac medications before your next appointment, please call your pharmacy*   Lab Work: None  If you have labs (blood work) drawn today and your tests are completely normal, you will receive your results only by: MyChart Message (if you have MyChart) OR A paper copy in the mail If you have any lab test that is abnormal or we need to change your treatment, we will call you to review the results.   Testing/Procedures: None   Follow-Up: At [facility], you and your health needs are our priority.  As part of our continuing mission to provide you with exceptional heart care, we have created designated Provider Care Teams.  These Care Teams include your primary Cardiologist (physician) and Advanced Practice Providers (APPs -  Physician Assistants and Nurse Practitioners) who all work together to provide you with the care you need, when you need it.  Your next appointment:   6 month(s)  Provider:   Jari Favre, PA-C, Robin Searing, NP, Jacolyn Reedy, PA-C, Eligha Bridegroom, NP, or Tereso Newcomer, PA-C

## 2022-11-15 NOTE — Telephone Encounter (Signed)
MD decided pt does not need Cardiac PET Scan at this time.

## 2022-11-19 NOTE — Telephone Encounter (Signed)
Called daughter and she is going to move appt. Thanked me for calling. Super nice

## 2022-11-21 ENCOUNTER — Ambulatory Visit: Payer: Medicare PPO | Admitting: Internal Medicine

## 2022-11-21 DIAGNOSIS — N189 Chronic kidney disease, unspecified: Secondary | ICD-10-CM | POA: Diagnosis not present

## 2022-11-21 DIAGNOSIS — E1122 Type 2 diabetes mellitus with diabetic chronic kidney disease: Secondary | ICD-10-CM | POA: Diagnosis not present

## 2022-11-21 DIAGNOSIS — M1990 Unspecified osteoarthritis, unspecified site: Secondary | ICD-10-CM | POA: Diagnosis not present

## 2022-11-21 DIAGNOSIS — E1142 Type 2 diabetes mellitus with diabetic polyneuropathy: Secondary | ICD-10-CM | POA: Diagnosis not present

## 2022-11-21 DIAGNOSIS — F325 Major depressive disorder, single episode, in full remission: Secondary | ICD-10-CM | POA: Diagnosis not present

## 2022-11-21 DIAGNOSIS — I129 Hypertensive chronic kidney disease with stage 1 through stage 4 chronic kidney disease, or unspecified chronic kidney disease: Secondary | ICD-10-CM | POA: Diagnosis not present

## 2022-11-21 DIAGNOSIS — I2510 Atherosclerotic heart disease of native coronary artery without angina pectoris: Secondary | ICD-10-CM | POA: Diagnosis not present

## 2022-11-21 DIAGNOSIS — E785 Hyperlipidemia, unspecified: Secondary | ICD-10-CM | POA: Diagnosis not present

## 2022-11-21 DIAGNOSIS — G4733 Obstructive sleep apnea (adult) (pediatric): Secondary | ICD-10-CM | POA: Diagnosis not present

## 2022-11-26 ENCOUNTER — Ambulatory Visit: Payer: Medicare PPO | Admitting: Physician Assistant

## 2022-11-30 DIAGNOSIS — M792 Neuralgia and neuritis, unspecified: Secondary | ICD-10-CM | POA: Diagnosis not present

## 2022-11-30 DIAGNOSIS — L603 Nail dystrophy: Secondary | ICD-10-CM | POA: Diagnosis not present

## 2022-11-30 DIAGNOSIS — B351 Tinea unguium: Secondary | ICD-10-CM | POA: Diagnosis not present

## 2022-11-30 DIAGNOSIS — M722 Plantar fascial fibromatosis: Secondary | ICD-10-CM | POA: Diagnosis not present

## 2022-11-30 DIAGNOSIS — E1142 Type 2 diabetes mellitus with diabetic polyneuropathy: Secondary | ICD-10-CM | POA: Diagnosis not present

## 2022-11-30 DIAGNOSIS — I739 Peripheral vascular disease, unspecified: Secondary | ICD-10-CM | POA: Diagnosis not present

## 2022-12-05 DIAGNOSIS — G4733 Obstructive sleep apnea (adult) (pediatric): Secondary | ICD-10-CM | POA: Diagnosis not present

## 2022-12-12 ENCOUNTER — Ambulatory Visit: Payer: Medicare PPO | Attending: Cardiology | Admitting: Pharmacist

## 2022-12-12 VITALS — BP 140/60 | HR 58

## 2022-12-12 DIAGNOSIS — E1122 Type 2 diabetes mellitus with diabetic chronic kidney disease: Secondary | ICD-10-CM | POA: Diagnosis not present

## 2022-12-12 DIAGNOSIS — N182 Chronic kidney disease, stage 2 (mild): Secondary | ICD-10-CM

## 2022-12-12 DIAGNOSIS — Z7984 Long term (current) use of oral hypoglycemic drugs: Secondary | ICD-10-CM | POA: Diagnosis not present

## 2022-12-12 DIAGNOSIS — I5032 Chronic diastolic (congestive) heart failure: Secondary | ICD-10-CM

## 2022-12-12 DIAGNOSIS — I10 Essential (primary) hypertension: Secondary | ICD-10-CM

## 2022-12-12 NOTE — Progress Notes (Unsigned)
Patient ID: Mariah Burns                 DOB: [birth year]                      MRN: [id number]      HPI: Mariah Burns is a 77 y.o. female referred by Dr. Izora Ribas to HTN clinic. PMH is significant for hypertension, pericardial effusion, OSA, T2DM, chronic diastolic heart failure, obesity.   At  Pharm D visit on 09/05/2022 BP was controlled so no medications were changed.At visit 4/30  with PharmD home BP cuff was validated- was inaccurate, and Jardiance was initiated.  At last visit 5/30 valsartan was increased to a total of 80mg  daily. Seen by Dr. Izora Ribas on 6/13. Blood pressure was 126/60 in clinic.   Patient presents today to clinic accompanied by her daughter. She brings in a list of blood pressure readings taken early in the AM before her medications. She rests about 2-3 min. Blood pressure range from 116-135/60-70's. Most readings in the low 130's or high 120's. Rare 140 reading. She denies any chest pain, SOB or swelling. She has been walking with her daughter most days. She states that she does feel tired and she does feel like her heart rate is increased but no palpitations and no radiation to her face. Would like to get back to exercising.   Current HTN meds: amlodipine 10 mg daily, valsartan 80 mg daily  Previously tried: felodipine 10 mg -dizziness  BP goal: <130/80  Family History:  Mom: heart problems, hypertension, T2DM Dad: died from work place accident  Siblings- sister and brother- T2DM, HTN  2 younger brothers passed away from drug over doses    Social History:  Alcohol: none  Smoking: never    Exercise: none  Was going to Gym regularly, stopped going due to chest discomfort   Walks down and road and back with a family member  Home BP readings: 116-135/60-70's HR high 50's-70  128/60 heart rate 57  129/59 heart rate 56   Wt Readings from Last 3 Encounters:  11/15/22 149 lb 9.6 oz (67.9 kg)  10/24/22 150 lb (68 kg)  09/24/22 151 lb  (68.5 kg)   BP Readings from Last 3 Encounters:  12/12/22 (!) 140/60  11/15/22 126/60  11/01/22 128/60   Pulse Readings from Last 3 Encounters:  12/12/22 (!) 58  11/15/22 60  11/01/22 (!) 57    Renal function: CrCl cannot be calculated (Patient's most recent lab result is older than the maximum 21 days allowed.).  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 90 tablet 2   ascorbic acid (VITAMIN C) 500 MG tablet Take 500 mg by mouth daily.     aspirin EC 81 MG tablet Take 81 mg by mouth daily. Swallow whole.     atorvastatin (LIPITOR) 40 MG tablet TAKE ONE  TABLET BY MOUTH DAILY EACH MONDAY-FRIDAY. SKIP WEEKENDS. 90 tablet 1   Cholecalciferol (VITAMIN D-3 PO) Take 1,000 Units by mouth daily.     empagliflozin (JARDIANCE) 10 MG TABS tablet Take 1 tablet (10 mg total) by mouth daily before breakfast. 30 tablet 11   ezetimibe (ZETIA) 10 MG tablet Take 1 tablet (10 mg total) by mouth daily. 30 tablet 10   Fluocinolone Acetonide Scalp 0.01 % OIL Apply to affected area of scalp as directed 1-2 x/day as needed for itching 118.28 mL 1   Menthol, Topical Analgesic, (BIOFREEZE ROLL-ON EX) Apply 1 application. topically as needed (pain in back,neck,thumb).     nitroGLYCERIN (NITROSTAT) 0.4 MG SL tablet Place 1 tablet (0.4 mg total) under the tongue every 5 (five) minutes as needed for chest pain. 25 tablet 2   nystatin (MYCOSTATIN/NYSTOP) powder as needed (skin irritation).     Polyvinyl  Alcohol-Povidone PF 1.4-0.6 % SOLN Place 1 drop into both eyes daily as needed (dry eyes).      RESTASIS 0.05 % ophthalmic emulsion as needed (dry eyes).     TART CHERRY PO daily at 6 (six) AM.     valsartan (DIOVAN) 80 MG tablet Take 1 tablet (80 mg total) by mouth daily. 90 tablet 3   Current Facility-Administered Medications on File Prior to Visit  Medication Dose Route Frequency Provider Last Rate Last Admin   0.9 %  sodium chloride infusion   Intravenous PRN Arnette Felts, FNP        Allergies  Allergen Reactions   Fosamax [Alendronate Sodium] Other (See Comments)    REACTION: "CAUSE GI UPSET AND FATIGUE"   Omeprazole Other (See Comments)    Lower ab pain   Motrin [Ibuprofen] Other (See Comments)    GI upset- has to eat prior to taking   Fluvastatin Other (See Comments)    mild memoryproblems of confusion     Blood pressure (!) 140/60, pulse (!) 58.   Essential hypertension Assessment: Blood pressure today is above goal of <130/80 Home blood pressures and typically in the high 120's to low 130's/60-70 She is checking early in the AM before medications and is only resting a few min She wants to get back to exercising. Needs a letter from MD to go back to gym  Has been walking with her daughter without chest pain or palpitations  She really doesn't want to increase medications  Plan: Continue amlodipine 10mg  daily and valsartan 80mg  daily Will forward note to Dr. Freda Jackson to hopefully provide note to clear her to exercise again I have asked her to rest 10 min prior to checking BP and vary the time she checks Hopefully with the increase in exercise BP will be more consistently <130. She is to call me if it does not      Thank you  Olene Floss, Pharm.D, BCACP, BCPS, CPP [facility] [address] [place], [place] [address]  Phone: [phone]; Fax: [phone]

## 2022-12-12 NOTE — Assessment & Plan Note (Signed)
Assessment: Blood pressure today is above goal of <130/80 Home blood pressures and typically in the high 120's to low 130's/60-70 She is checking early in the AM before medications and is only resting a few min She wants to get back to exercising. Needs a letter from MD to go back to gym  Has been walking with her daughter without chest pain or palpitations  She really doesn't want to increase medications  Plan: Continue amlodipine 10mg  daily and valsartan 80mg  daily Will forward note to Dr. Freda Jackson to hopefully provide note to clear her to exercise again I have asked her to rest 10 min prior to checking BP and vary the time she checks Hopefully with the increase in exercise BP will be more consistently <130. She is to call me if it does not

## 2022-12-12 NOTE — Patient Instructions (Signed)
Your blood pressure goal is < 130/81mmHg   Continue taking amlodipine 10 mg daily, valsartan 80 mg daily. If your blood pressure starts to stay in the 130's consistently, please call us at [phone] Try to rest 5-10 min before checking blood pressure and check at varying times of the day.  Important lifestyle changes to control high blood pressure  Intervention  Effect on the BP   Weight loss Weight loss is one of the most effective lifestyle changes for controlling blood pressure. If you're overweight or obese, losing even a small amount of weight can help reduce blood pressure.    Blood pressure can decrease by 1 millimeter of mercury (mmHg) with each kilogram (about 2.2 pounds) of weight lost.   Exercise regularly As a general goal, aim for 30 minutes of moderate physical activity every day.    Regular physical activity can lower blood pressure by 5 - 8 mmHg.   Eat a healthy diet Eat a diet rich in whole grains, fruits, vegetables, lean meat, and low-fat dairy products. Limit processed foods, saturated fat, and sweets.    A heart-healthy diet can lower high blood pressure by 10 mmHg.   Reduce salt (sodium) in your diet Aim for 000mg  of sodium each day. Avoid deli meats, canned food, and frozen microwave meals which are high in sodium.     Limiting sodium can reduce blood pressure by 5 mmHg.   Limit alcohol One drink equals 12 ounces of beer, 5 ounces of wine, or 1.5 ounces of 80-proof liquor.    Limiting alcohol to < 1 drink a day for women or < 2 drinks a day for men can help lower blood pressure by about 4 mmHg.   To check your pressure at home you will need to:   Sit up in a chair, with feet flat on the floor and back supported. Do not cross your ankles or legs. Rest your left arm so that the cuff is about heart level. If the cuff goes on your upper arm, then just relax your arm on the table, arm of the chair, or your lap. If you have a wrist cuff, hold your wrist  against your chest at heart level. Place the cuff snugly around your arm, about 1 inch above the crease of your elbow. The cords should be inside the groove of your elbow.  Sit quietly, with the cuff in place, for about 5 minutes. Then press the power button to start a reading. Do not talk or move while the reading is taking place.  Record your readings on a sheet of paper. Although most cuffs have a memory, it is often easier to see a pattern developing when the numbers are all in front of you.  You can repeat the reading after 1-3 minutes if it is recommended.   Make sure your bladder is empty and you have not had caffeine or tobacco within the last 30 minutes   Always bring your blood pressure log with you to your appointments. If you have not brought your monitor in to be double checked for accuracy, please bring it to your next appointment.   You can find a list of validated (accurate) blood pressure cuffs at: validatebp.org

## 2022-12-13 LAB — BASIC METABOLIC PANEL
BUN/Creatinine Ratio: 13 (ref 12–28)
BUN: 12 mg/dL (ref 8–27)
CO2: 21 mmol/L (ref 20–29)
Calcium: 9.6 mg/dL (ref 8.7–10.3)
Chloride: 105 mmol/L (ref 96–106)
Creatinine, Ser: 0.94 mg/dL (ref 0.57–1.00)
Glucose: 102 mg/dL — ABNORMAL HIGH (ref 70–99)
Potassium: 3.5 mmol/L (ref 3.5–5.2)
Sodium: 142 mmol/L (ref 134–144)
eGFR: 62 mL/min/{1.73_m2} (ref >59)

## 2022-12-14 ENCOUNTER — Encounter: Payer: Self-pay | Admitting: Psychology

## 2022-12-14 DIAGNOSIS — K625 Hemorrhage of anus and rectum: Secondary | ICD-10-CM | POA: Insufficient documentation

## 2022-12-14 DIAGNOSIS — R1310 Dysphagia, unspecified: Secondary | ICD-10-CM | POA: Insufficient documentation

## 2022-12-14 DIAGNOSIS — K219 Gastro-esophageal reflux disease without esophagitis: Secondary | ICD-10-CM | POA: Insufficient documentation

## 2022-12-14 DIAGNOSIS — Z860100 Personal history of colon polyps, unspecified: Secondary | ICD-10-CM | POA: Insufficient documentation

## 2022-12-14 DIAGNOSIS — R140 Abdominal distension (gaseous): Secondary | ICD-10-CM | POA: Insufficient documentation

## 2022-12-14 DIAGNOSIS — M1990 Unspecified osteoarthritis, unspecified site: Secondary | ICD-10-CM | POA: Insufficient documentation

## 2022-12-14 HISTORY — DX: Abdominal distension (gaseous): R14.0

## 2022-12-17 ENCOUNTER — Ambulatory Visit: Payer: Medicare PPO

## 2022-12-17 ENCOUNTER — Institutional Professional Consult (permissible substitution): Payer: Medicare PPO | Admitting: Psychology

## 2022-12-19 ENCOUNTER — Ambulatory Visit: Payer: Medicare PPO | Admitting: Internal Medicine

## 2022-12-19 ENCOUNTER — Ambulatory Visit (INDEPENDENT_AMBULATORY_CARE_PROVIDER_SITE_OTHER): Payer: Medicare PPO

## 2022-12-19 ENCOUNTER — Encounter: Payer: Self-pay | Admitting: Internal Medicine

## 2022-12-19 VITALS — BP 124/70 | HR 62 | Temp 98.0°F | Ht 59.0 in | Wt 147.0 lb

## 2022-12-19 VITALS — BP 124/70 | HR 62 | Temp 98.0°F | Ht 59.0 in | Wt 147.4 lb

## 2022-12-19 DIAGNOSIS — G3184 Mild cognitive impairment, so stated: Secondary | ICD-10-CM

## 2022-12-19 DIAGNOSIS — I10 Essential (primary) hypertension: Secondary | ICD-10-CM

## 2022-12-19 DIAGNOSIS — N182 Chronic kidney disease, stage 2 (mild): Secondary | ICD-10-CM | POA: Diagnosis not present

## 2022-12-19 DIAGNOSIS — Z0000 Encounter for general adult medical examination without abnormal findings: Secondary | ICD-10-CM | POA: Diagnosis not present

## 2022-12-19 DIAGNOSIS — I129 Hypertensive chronic kidney disease with stage 1 through stage 4 chronic kidney disease, or unspecified chronic kidney disease: Secondary | ICD-10-CM | POA: Diagnosis not present

## 2022-12-19 DIAGNOSIS — Z6829 Body mass index (BMI) 29.0-29.9, adult: Secondary | ICD-10-CM

## 2022-12-19 DIAGNOSIS — D860 Sarcoidosis of lung: Secondary | ICD-10-CM

## 2022-12-19 DIAGNOSIS — R0982 Postnasal drip: Secondary | ICD-10-CM

## 2022-12-19 DIAGNOSIS — E1122 Type 2 diabetes mellitus with diabetic chronic kidney disease: Secondary | ICD-10-CM | POA: Diagnosis not present

## 2022-12-19 DIAGNOSIS — R413 Other amnesia: Secondary | ICD-10-CM

## 2022-12-19 DIAGNOSIS — E559 Vitamin D deficiency, unspecified: Secondary | ICD-10-CM

## 2022-12-19 DIAGNOSIS — E7800 Pure hypercholesterolemia, unspecified: Secondary | ICD-10-CM

## 2022-12-19 LAB — HEMOGLOBIN A1C
Est. average glucose Bld gHb Est-mCnc: 105 mg/dL
Hgb A1c MFr Bld: 5.3 % (ref 4.8–5.6)

## 2022-12-19 NOTE — Patient Instructions (Signed)

## 2022-12-19 NOTE — Progress Notes (Signed)
Subjective:   Mariah Burns is a 77 y.o. female who presents for Medicare Annual (Subsequent) preventive examination.  Visit Complete: In person    Review of Systems     Cardiac Risk Factors include: advanced age (>48men, >40 women);diabetes mellitus;dyslipidemia;hypertension     Objective:    Today's Vitals   12/19/22 0943  BP: 124/70  Pulse: 62  Temp: 98 F (36.7 C)  TempSrc: Oral  Weight: 147 lb (66.7 kg)  Height: 4\' 11"  (1.499 m)   Body mass index is 29.69 kg/m.     12/19/2022    9:49 AM 10/24/2022    9:52 AM 12/06/2021    9:47 AM 07/07/2021    9:47 PM 02/02/2021    4:38 PM 01/03/2021   10:06 AM 11/09/2020   12:40 PM  Advanced Directives  Does Patient Have a Medical Advance Directive? Yes Yes Yes No No Yes Yes  Type of Estate agent of [place];Living will Healthcare Power of [employer] of [place];Living will   Healthcare Power of [place];Living will Healthcare Power of [place];Living will  Does patient want to make changes to medical advance directive?  No - Patient declined    No - Patient declined   Copy of Healthcare Power of Attorney in Chart? No - copy requested No - copy requested No - copy requested   No - copy requested No - copy requested  Would patient like information on creating a medical advance directive?    No - Patient declined       Current Medications (verified) Outpatient Encounter Medications as of 12/19/2022  Medication Sig   amLODipine (NORVASC) 10 MG tablet Take 1 tablet (10 mg total) by mouth daily.   ascorbic acid (VITAMIN C) 500 MG tablet Take 500 mg by mouth daily.   aspirin EC 81 MG tablet Take 81 mg by mouth daily. Swallow whole.   atorvastatin (LIPITOR) 40 MG tablet TAKE ONE TABLET BY MOUTH DAILY EACH MONDAY-FRIDAY. SKIP WEEKENDS.   Cholecalciferol (VITAMIN D-3 PO) Take 1,000 Units by mouth daily.   empagliflozin (JARDIANCE) 10 MG TABS tablet Take 1 tablet (10 mg total) by mouth daily before  breakfast.   ezetimibe (ZETIA) 10 MG tablet Take 1 tablet (10 mg total) by mouth daily.   Fluocinolone Acetonide Scalp 0.01 % OIL Apply to affected area of scalp as directed 1-2 x/day as needed for itching   Menthol, Topical Analgesic, (BIOFREEZE ROLL-ON EX) Apply 1 application. topically as needed (pain in back,neck,thumb).   nystatin (MYCOSTATIN/NYSTOP) powder as needed (skin irritation).   Polyvinyl Alcohol-Povidone PF 1.4-0.6 % SOLN Place 1 drop into both eyes daily as needed (dry eyes).    RESTASIS 0.05 % ophthalmic emulsion as needed (dry eyes).   TART CHERRY PO daily at 6 (six) AM.   valsartan (DIOVAN) 80 MG tablet Take 1 tablet (80 mg total) by mouth daily.   nitroGLYCERIN (NITROSTAT) 0.4 MG SL tablet Place 1 tablet (0.4 mg total) under the tongue every 5 (five) minutes as needed for chest pain. (Patient not taking: Reported on 12/19/2022)   Facility-Administered Encounter Medications as of 12/19/2022  Medication   0.9 %  sodium chloride infusion    Allergies (verified) Fosamax [alendronate sodium], Omeprazole, Motrin [ibuprofen], and Fluvastatin   History: Past Medical History:  Diagnosis Date   Abdominal bloating 12/14/2022   Arthritis    Chronic bilateral low back pain without sciatica 10/28/2019   Chronic diastolic heart failure 06/06/2021   Class 1 obesity due to excess  calories with serious comorbidity and body mass index (BMI) of 30.0 to 30.9 in adult 10/28/2019   Cyst of right breast 01/30/2017   1 cm     Dry mouth 11/19/2020   She is encouraged to increase fluid intake and to consider use of Biotene products. Also advised to increase her intake of healthy fats - walnuts, avocado etc.      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   Mild neurocognitive disorder 02/23/2022   Obstructive sleep apnea 03/21/2007   NPSG 03/08/2010  AHI 19.8/hr            Osteopenia    Pericardial effusion 02/17/2015   Moderate circumferential effusion 2004 by echo     Personal history of colonic polyps    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 We discussed the use of Trintellix, she declines at this time.      Stage 2 chronic kidney disease 05/15/2018   Type 2 diabetes mellitus, without long-term current use of insulin 05/06/2010   Vitamin D deficiency 10/28/2019   Past Surgical History:  Procedure Laterality Date   BREAST LUMPECTOMY WITH RADIOACTIVE SEED AND SENTINEL LYMPH NODE BIOPSY Right 09/02/2018   Procedure: RIGHT BREAST LUMPECTOMY WITH RADIOACTIVE SEED AND  RIGHT SENTINEL LYMPH NODE BIOPSY;  Surgeon: Glenna Fellows, MD;  Location: [facility];  Service: General;  Laterality: Right;   BRONCHOSCOPY     dx'd sarcoid   KNEE ARTHROPLASTY Left 03/13/2017   NEUROPLASTY / TRANSPOSITION ULNAR NERVE AT ELBOW  09/2011   TOTAL ABDOMINAL HYSTERECTOMY     Family History  Problem Relation Age of Onset   Allergies Sister    Heart disease Sister    Lupus Sister    Allergies Mother    Allergies Daughter    Healthy Daughter    Heart disease Sister    Gout Brother    Hypertension Brother    Heart disease Brother     Heart failure Father    Gout Brother    Hypertension Brother    Hypertension Brother    Hypertension Brother    Post-traumatic stress disorder Brother    Healthy Daughter    Social History   Socioeconomic History   Marital status: Divorced    Spouse name: Not on file   Number of children: 3   Years of education: College   Highest education level: Not on file  Occupational History   Occupation: Retired    Associate Professor: RETIRED  Tobacco Use   Smoking status: Never    Passive exposure: Never   Smokeless tobacco: Never  Vaping Use   Vaping status: Never Used  Substance and Sexual Activity  Alcohol use: No   Drug use: No   Sexual activity: Not Currently  Other Topics Concern   Not on file  Social History Narrative   05/23/21 Patient lives at home with youngest daughter.   Caffeine Use: none   Right handed   One story home   retired   Chemical engineer Strain: Low Risk  (12/19/2022)   Overall Financial Resource Strain (CARDIA)    Difficulty of Paying Living Expenses: Not hard at all  Food Insecurity: No Food Insecurity (12/19/2022)   Hunger Vital Sign    Worried About Running Out of Food in the Last Year: Never true    Ran Out of Food in the Last Year: Never true  Transportation Needs: No Transportation Needs (12/19/2022)   PRAPARE - Administrator, Civil Service (Medical): No    Lack of Transportation (Non-Medical): No  Physical Activity: Insufficiently Active (12/19/2022)   Exercise Vital Sign    Days of Exercise per Week: 5 days    Minutes of Exercise per Session: 20 min  Stress: No Stress Concern Present (12/19/2022)   [employer] of Occupational Health - Occupational Stress Questionnaire    Feeling of Stress : Not at all  Social Connections: Moderately Integrated (12/19/2022)   Social Connection and Isolation Panel [NHANES]    Frequency of Communication with Friends and Family: More than three times a week     Frequency of Social Gatherings with Friends and Family: Never    Attends Religious Services: More than 4 times per year    Active Member of [employer] or Organizations: Yes    Attends Banker Meetings: Never    Marital Status: Divorced    Tobacco Counseling Counseling given: Not Answered   Clinical Intake:  Pre-visit preparation completed: Yes  Pain : No/denies pain     Nutritional Status: BMI 25 -29 Overweight Nutritional Risks: None Diabetes: Yes CBG done?: No Did pt. bring in CBG monitor from home?: No  How often do you need to have someone help you when you read instructions, pamphlets, or other written materials from your doctor or pharmacy?: 1 - Never  Interpreter Needed?: No  Information entered by :: NAllen LPN   Activities of Daily Living    12/19/2022    9:44 AM  In your present state of health, do you have any difficulty performing the following activities:  Hearing? 1  Comment slight decrease  Vision? 0  Difficulty concentrating or making decisions? 1  Walking or climbing stairs? 0  Dressing or bathing? 0  Doing errands, shopping? 1  Preparing Food and eating ? N  Using the Toilet? N  In the past six months, have you accidently leaked urine? N  Do you have problems with loss of bowel control? N  Managing your Medications? Y  Managing your Finances? N  Housekeeping or managing your Housekeeping? N    Patient Care Team: Dorothyann Peng, MD as PCP - General (Internal Medicine) Christell Constant, MD as PCP - Cardiology (Cardiology) Pershing Proud, RN as Oncology Nurse Navigator Rogelia Boga, Eileen Stanford, RN as Oncology Nurse Navigator Glenna Fellows, MD (Inactive) as Consulting Physician (General Surgery) Malachy Mood, MD as Consulting Physician (Hematology) Antony Blackbird, MD as Consulting Physician (Radiation Oncology) Pollyann Samples, NP as Nurse Practitioner (Nurse Practitioner) Suzi Roots as Physician Assistant  (Dermatology) Chalmers Guest, MD as Consulting Physician (Ophthalmology)  Indicate any recent Medical Services you may have received from  other than [facility] providers in the past year (date may be approximate).     Assessment:   This is a routine wellness examination for [facility].  Hearing/Vision screen Hearing Screening - Comments:: States has slight decrease Vision Screening - Comments:: Regular eye exams, DR. Whitaker  Dietary issues and exercise activities discussed:     Goals Addressed             This Visit's Progress    Patient Stated       12/19/2022, keep on keeping       Depression Screen    12/19/2022    9:51 AM 09/24/2022    4:25 PM 01/31/2022   10:08 AM 12/06/2021    9:48 AM 10/05/2021   11:58 AM 06/06/2021   11:09 AM 02/14/2021    8:50 AM  PHQ 2/9 Scores  PHQ - 2 Score 0 4 0 0 0 4 0  PHQ- 9 Score 3 18 0  0 18 2    Fall Risk    12/19/2022    9:50 AM 10/24/2022    9:51 AM 09/24/2022    4:24 PM 12/06/2021    9:48 AM 10/05/2021   11:59 AM  Fall Risk   Falls in the past year? 0 0 0 0 0  Number falls in past yr: 0 0 0 0 0  Injury with Fall? 0 0 0 0 0  Risk for fall due to : Medication side effect  No Fall Risks Medication side effect   Follow up Falls prevention discussed;Falls evaluation completed Falls evaluation completed Falls evaluation completed Falls evaluation completed;Education provided;Falls prevention discussed     MEDICARE RISK AT HOME:  Medicare Risk at Home - 12/19/22 0950     Any stairs in or around the home? No    If so, are there any without handrails? No    Home free of loose throw rugs in walkways, pet beds, electrical cords, etc? Yes    Adequate lighting in your home to reduce risk of falls? Yes    Life alert? No    Use of a cane, walker or w/c? No    Grab bars in the bathroom? Yes    Shower chair or bench in shower? Yes    Elevated toilet seat or a handicapped toilet? Yes             TIMED UP AND GO:  Was the test performed?  Yes   Length of time to ambulate 10 feet: 6 sec Gait steady and fast without use of assistive device    Cognitive Function:  6 CIT not administered. Patient has diagnosis of cognitive impairment.      05/23/2021   10:21 AM  MMSE - Mini Mental State Exam  Orientation to time 5  Orientation to Place 4  Registration 3  Attention/ Calculation 3  Recall 1  Language- name 2 objects 2  Language- repeat 0  Language- follow 3 step command 3  Language- read & follow direction 1  Write a sentence 1  Copy design 1  Total score 24      10/24/2022    9:00 AM  Montreal Cognitive Assessment   Visuospatial/ Executive (0/5) 4  Naming (0/3) 3  Attention: Read list of digits (0/2) 2  Attention: Read list of letters (0/1) 1  Attention: Serial 7 subtraction starting at 100 (0/3) 0  Language: Repeat phrase (0/2) 2  Language : Fluency (0/1) 1  Abstraction (0/2) 2  Delayed Recall (0/5) 1  Orientation (0/6) 4  Total 20  Adjusted Score (based on education) 20      09/24/2022    4:58 PM 12/06/2021    9:53 AM 11/09/2020   12:48 PM 10/28/2019   10:32 AM 10/23/2018   11:51 AM  6CIT Screen  What Year? 0 points 0 points 0 points 0 points 0 points  What month? 0 points 0 points 0 points 0 points 0 points  What time? 3 points 3 points 0 points 0 points 0 points  Count back from 20 0 points 0 points 0 points 0 points 0 points  Months in reverse 0 points 0 points 0 points 0 points 0 points  Repeat phrase 8 points 2 points 2 points 0 points 0 points  Total Score 11 points 5 points 2 points 0 points 0 points    Immunizations Immunization History  Administered Date(s) Administered   Covid-19, Mrna,Vaccine(Spikevax)23yrs and older 05/23/2022   Fluad Quad(high Dose 65+) 02/12/2019, 02/23/2020, 03/30/2021, 03/13/2022   Influenza Split 04/10/2011, 04/15/2012, 03/04/2013, 03/04/2014, 03/05/2015, 03/04/2017   Influenza Whole 06/13/2010   Influenza,inj,Quad PF,6+ Mos 03/22/2016   Influenza-Unspecified  03/22/2016, 03/04/2018   PFIZER(Purple Top)SARS-COV-2 Vaccination 08/09/2019, 09/08/2019, 05/18/2020   Pneumococcal Conjugate-13 11/13/2017   Pneumococcal Polysaccharide-23 06/04/2009, 12/28/2010, 06/11/2019   Td 05/31/2008   Tdap 12/13/2018   Unspecified SARS-COV-2 Vaccination 09/08/2019   Zoster Recombinant(Shingrix) 10/05/2021, 01/31/2022    TDAP status: Up to date  Flu Vaccine status: Up to date  Pneumococcal vaccine status: Up to date  Covid-19 vaccine status: Completed vaccines  Qualifies for Shingles Vaccine? Yes   Zostavax completed Yes   Shingrix Completed?: Yes  Screening Tests Health Maintenance  Topic Date Due   COVID-19 Vaccine (6 - 2023-24 season) 07/18/2022   MAMMOGRAM  10/08/2022   OPHTHALMOLOGY EXAM  11/01/2022   INFLUENZA VACCINE  01/03/2023   Diabetic kidney evaluation - Urine ACR  03/14/2023   FOOT EXAM  03/14/2023   HEMOGLOBIN A1C  03/26/2023   Diabetic kidney evaluation - eGFR measurement  12/12/2023   Medicare Annual Wellness (AWV)  12/19/2023   DTaP/Tdap/Td (3 - Td or Tdap) 12/12/2028   Pneumonia Vaccine 91+ Years old  Completed   DEXA SCAN  Completed   Hepatitis C Screening  Completed   Zoster Vaccines- Shingrix  Completed   HPV VACCINES  Aged Out   Colonoscopy  Discontinued    Health Maintenance  Health Maintenance Due  Topic Date Due   COVID-19 Vaccine (6 - 2023-24 season) 07/18/2022   MAMMOGRAM  10/08/2022   OPHTHALMOLOGY EXAM  11/01/2022    Colorectal cancer screening: No longer required.   Mammogram status: Ordered 3/827/2024. Pt provided with contact info and advised to call to schedule appt.   Bone Density status: Completed 08/14/2021.  Lung Cancer Screening: (Low Dose CT Chest recommended if Age 17-80 years, 20 pack-year currently smoking OR have quit w/in 15years.) does not qualify.   Lung Cancer Screening Referral: no  Additional Screening:  Hepatitis C Screening: does qualify; Completed 05/23/2018  Vision Screening:  Recommended annual ophthalmology exams for early detection of glaucoma and other disorders of the eye. Is the patient up to date with their annual eye exam?  Yes  Who is the provider or what is the name of the office in which the patient attends annual eye exams? Dr. Harlon Flor If pt is not established with a provider, would they like to be referred to a provider to establish care? No .   Dental Screening: Recommended annual dental exams  for proper oral hygiene  Diabetic Foot Exam: Diabetic Foot Exam: Completed 03/13/2022  Community Resource Referral / Chronic Care Management: CRR required this visit?  No   CCM required this visit?  No     Plan:     I have personally reviewed and noted the following in the patient's chart:   Medical and social history Use of alcohol, tobacco or illicit drugs  Current medications and supplements including opioid prescriptions. Patient is not currently taking opioid prescriptions. Functional ability and status Nutritional status Physical activity Advanced directives List of other physicians Hospitalizations, surgeries, and ER visits in previous 12 months Vitals Screenings to include cognitive, depression, and falls Referrals and appointments  In addition, I have reviewed and discussed with patient certain preventive protocols, quality metrics, and best practice recommendations. A written personalized care plan for preventive services as well as general preventive health recommendations were provided to patient.     Barb Merino, LPN   1/61/0960   After Visit Summary: in person  Nurse Notes: none

## 2022-12-19 NOTE — Progress Notes (Signed)
I,Victoria T Deloria Lair, CMA,acting as a Neurosurgeon for Mariah Aliment, MD.,have documented all relevant documentation on the behalf of Mariah Aliment, MD,as directed by  Mariah Aliment, MD while in the presence of Mariah Aliment, MD.  Subjective:  Patient ID: Mariah Burns , female    DOB: 1945/09/23 , 77 y.o.   MRN: [id number]  Chief Complaint  Patient presents with   Diabetes   Hypertension    HPI  The patient is here today for a diabetes and blood pressure f/u.  She reports compliance with meds. She denies headaches, chest pain and shortness of breath. She is accompanied by her daughter, Gates Rigg.    She  c/o  runny nose, no fever/chills. She states this initially started this past Sunday. Denies having any ill contacts. No cough, body aches or headaches.   Letter sent to [facility] for eye exam.   AWV completed with [facility] Advisor; [place]  Diabetes She presents for her follow-up diabetic visit. She has type 2 diabetes mellitus. Her disease course has been stable. Pertinent negatives for diabetes include no blurred vision. There are no hypoglycemic complications. Diabetic complications include nephropathy. Risk factors for coronary artery disease include diabetes mellitus, dyslipidemia, hypertension, sedentary lifestyle and post-menopausal. She is compliant with treatment most of the time.  Hypertension This is a chronic problem. The current episode started more than 1 year ago. The problem has been gradually improving since onset. The problem is controlled. Pertinent negatives include no blurred vision.     Past Medical History:  Diagnosis Date   Abdominal bloating 12/14/2022   Arthritis    Chronic bilateral low back pain without sciatica 10/28/2019   Chronic diastolic heart failure 06/06/2021   Class 1 obesity due to excess calories with serious comorbidity and body mass index (BMI) of 30.0 to 30.9 in adult 10/28/2019   Cyst of right breast 01/30/2017   1 cm     Dry mouth  11/19/2020   She is encouraged to increase fluid intake and to consider use of Biotene products. Also advised to increase her intake of healthy fats - walnuts, avocado etc.      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   Mild neurocognitive disorder 02/23/2022   Obstructive sleep apnea 03/21/2007   NPSG 03/08/2010  AHI 19.8/hr            Osteopenia    Pericardial effusion 02/17/2015   Moderate circumferential effusion 2004 by echo     Personal history of colonic polyps    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 We discussed the use of Trintellix, she declines at this time.  Stage 2 chronic kidney disease 05/15/2018   Type 2 diabetes mellitus, without long-term current use of insulin 05/06/2010   Vitamin D deficiency 10/28/2019     Family History  Problem Relation Age of Onset   Allergies Sister    Heart disease Sister    Lupus Sister    Allergies Mother    Allergies Daughter    Healthy Daughter    Heart disease Sister    Gout Brother    Hypertension Brother    Heart disease Brother    Heart failure Father    Gout Brother     Hypertension Brother    Hypertension Brother    Hypertension Brother    Post-traumatic stress disorder Brother    Healthy Daughter      Current Outpatient Medications:    amLODipine (NORVASC) 10 MG tablet, Take 1 tablet (10 mg total) by mouth daily., Disp: 90 tablet, Rfl: 2   ascorbic acid (VITAMIN C) 500 MG tablet, Take 500 mg by mouth daily., Disp: , Rfl:    aspirin EC 81 MG tablet, Take 81 mg by mouth daily. Swallow whole., Disp: , Rfl:    atorvastatin (LIPITOR) 40 MG tablet, TAKE ONE TABLET BY MOUTH DAILY EACH MONDAY-FRIDAY. SKIP WEEKENDS., Disp: 90 tablet, Rfl: 1   Cholecalciferol (VITAMIN D-3 PO), Take 1,000 Units by mouth daily., Disp: , Rfl:    empagliflozin (JARDIANCE) 10 MG TABS tablet, Take 1 tablet (10 mg total) by mouth daily before breakfast., Disp: 30 tablet, Rfl: 11   ezetimibe (ZETIA) 10 MG tablet, Take 1 tablet (10 mg total) by mouth daily., Disp: 30 tablet, Rfl: 10   Fluocinolone Acetonide Scalp 0.01 % OIL, Apply to affected area of scalp as directed 1-2 x/day as needed for itching, Disp: 118.28 mL, Rfl: 1   Menthol, Topical Analgesic, (BIOFREEZE ROLL-ON EX), Apply 1 application. topically as needed (pain in back,neck,thumb)., Disp: , Rfl:    nystatin (MYCOSTATIN/NYSTOP) powder, as needed (skin irritation)., Disp: , Rfl:    Polyvinyl Alcohol-Povidone PF 1.4-0.6 % SOLN, Place 1 drop into both eyes daily as needed (dry eyes). , Disp: , Rfl:    RESTASIS 0.05 % ophthalmic emulsion, as needed (dry eyes)., Disp: , Rfl:    TART CHERRY PO, daily at 6 (six) AM., Disp: , Rfl:    valsartan (DIOVAN) 80 MG tablet, Take 1 tablet (80 mg total) by mouth daily., Disp: 90 tablet, Rfl: 3   nitroGLYCERIN (NITROSTAT) 0.4 MG SL tablet, Place 1 tablet (0.4 mg total) under the tongue every 5 (five) minutes as needed for chest pain. (Patient not taking: Reported on 12/19/2022), Disp: 25 tablet, Rfl: 2  Current Facility-Administered Medications:    0.9 %  sodium chloride infusion, , Intravenous,  PRN, Arnette Felts, FNP   Allergies  Allergen Reactions   Fosamax [Alendronate Sodium] Other (See Comments)    REACTION: "CAUSE GI UPSET AND FATIGUE"   Omeprazole Other (See Comments)    Lower ab pain   Motrin [Ibuprofen] Other (See Comments)    GI upset- has to eat prior to taking   Fluvastatin Other (See Comments)    mild memoryproblems of confusion      Review of Systems  Constitutional: Negative.   HENT:  Positive for rhinorrhea.   Eyes: Negative.  Negative for blurred vision.  Respiratory:  Positive for cough.   Cardiovascular: Negative.   Gastrointestinal: Negative.   Neurological: Negative.   Psychiatric/Behavioral: Negative.       Today's Vitals   12/19/22 0940  BP:  124/70  Pulse: 62  Temp: 98 F (36.7 C)  SpO2: 98%  Weight: 147 lb 6.4 oz (66.9 kg)  Height: 4\' 11"  (1.499 m)   Body mass index is 29.77 kg/m.  Wt Readings from Last 3 Encounters:  12/19/22 147 lb 6.4 oz (66.9 kg)  12/19/22 147 lb (66.7 kg)  11/15/22 149 lb 9.6 oz (67.9 kg)    BP Readings from Last 3 Encounters:  12/19/22 124/70  12/19/22 124/70  12/12/22 (!) 140/60     Objective:  Physical Exam Vitals and nursing note reviewed.  Constitutional:      Appearance: Normal appearance.  HENT:     Head: Normocephalic and atraumatic.  Eyes:     Extraocular Movements: Extraocular movements intact.  Cardiovascular:     Rate and Rhythm: Normal rate and regular rhythm.     Heart sounds: Murmur heard.  Pulmonary:     Effort: Pulmonary effort is normal.     Breath sounds: Normal breath sounds.  Musculoskeletal:     Cervical back: Normal range of motion.  Skin:    General: Skin is warm.  Neurological:     General: No focal deficit present.     Mental Status: She is alert.  Psychiatric:        Mood and Affect: Mood normal.        Behavior: Behavior normal.         Assessment And Plan:  Type 2 diabetes mellitus with stage 2 chronic kidney disease, without long-term current use of  insulin (HCC) Assessment & Plan: Chronic, she is currently on Jardiance 10mg  daily. She is encouraged to stay well hydrated while on this medication. She will f/u in four months for re-evaluation.   Orders: -     Hemoglobin A1c  Parenchymal renal hypertension, stage 1 through stage 4 or unspecified chronic kidney disease Assessment & Plan: Chronic, well controlled. Continue amlodipine 10mg  daily and valsartan 80mg  daily.  She is encouraged to follow low sodium diet.    Postnasal drip Assessment & Plan: She was given samples of Zyrtec 5mg  to take 1/2 pill daily. Daughter verbally acknowledges treatment plan.    Mild cognitive impairment Assessment & Plan: Chronic, she has been evaluated by Neurology. She is not on any medication at this time.    Pure hypercholesterolemia Assessment & Plan: Chronic, she will continue with atorvastatin 40mg  daily.  She is encouraged to follow a heart healthy lifestyle.     Pulmonary sarcoidosis (HCC) Assessment & Plan: Chronic, also followed by Pulmonary. As per Pulmonary, clinically in long-term remission   Body mass index (BMI) of 29.0 to 29.9 in adult Assessment & Plan: Her BMI is acceptable for her demographic. She is encouraged to aim for at least 150 minutes of exercise per week.       Return for 1 year AWV w thn and RS DM check; .  Patient was given opportunity to ask questions. Patient verbalized understanding of the plan and was able to repeat key elements of the plan. All questions were answered to their satisfaction.   I, Mariah Aliment, MD, have reviewed all documentation for this visit. The documentation on 12/19/22 for the exam, diagnosis, procedures, and orders are all accurate and complete.   IF YOU HAVE BEEN REFERRED TO A SPECIALIST, IT MAY TAKE 1-2 WEEKS TO SCHEDULE/PROCESS THE REFERRAL. IF YOU HAVE NOT HEARD FROM US/SPECIALIST IN TWO WEEKS, PLEASE GIVE [place] A CALL AT [phone].   THE PATIENT IS ENCOURAGED TO  PRACTICE SOCIAL DISTANCING DUE TO THE COVID-19 PANDEMIC.

## 2022-12-19 NOTE — Patient Instructions (Signed)
Mariah Burns , Thank you for taking time to come for your Medicare Wellness Visit. I appreciate your ongoing commitment to your health goals. Please review the following plan we discussed and let me know if I can assist you in the future.   These are the goals we discussed:  Goals      Manage My Medicine     Timeframe:  Long-Range Goal Priority:  High Start Date:                             Expected End Date:                       Follow Up Date `09/19/2022   In Progress:  - call for medicine refill 2 or 3 days before it runs out - call if I am sick and can't take my medicine - keep a list of all the medicines I take; vitamins and herbals too - use a pillbox to sort medicine - use an alarm clock or phone to remind me to take my medicine    Why is this important?   These steps will help you keep on track with your medicines.   Notes:  Please call with any questions.      Patient Stated     10/28/2019, wants to eat healthy, exercise and get out of the house daily     Patient Stated     11/09/2020, decrease medication load     Patient Stated     12/06/2021, wants to eat healthy     Patient Stated     12/19/2022, keep on keeping     Weight (lb) < 200 lb (90.7 kg)     Just wants to lose some weight no real set goal        This is a list of the screening recommended for you and due dates:  Health Maintenance  Topic Date Due   COVID-19 Vaccine (6 - 2023-24 season) 07/18/2022   Mammogram  10/08/2022   Eye exam for diabetics  11/01/2022   Flu Shot  01/03/2023   Yearly kidney health urinalysis for diabetes  03/14/2023   Complete foot exam   03/14/2023   Hemoglobin A1C  03/26/2023   Yearly kidney function blood test for diabetes  12/12/2023   Medicare Annual Wellness Visit  12/19/2023   DTaP/Tdap/Td vaccine (3 - Td or Tdap) 12/12/2028   Pneumonia Vaccine  Completed   DEXA scan (bone density measurement)  Completed   Hepatitis C Screening  Completed   Zoster (Shingles)  Vaccine  Completed   HPV Vaccine  Aged Out   Colon Cancer Screening  Discontinued    Advanced directives: Please bring a copy of your POA (Power of Attorney) and/or Living Will to your next appointment.   Conditions/risks identified: none  Next appointment: Follow up in one year for your annual wellness visit    Preventive Care 65 Years and Older, Female Preventive care refers to lifestyle choices and visits with your health care provider that can promote health and wellness. What does preventive care include? A yearly physical exam. This is also called an annual well check. Dental exams once or twice a year. Routine eye exams. Ask your health care provider how often you should have your eyes checked. Personal lifestyle choices, including: Daily care of your teeth and gums. Regular physical activity. Eating a healthy diet. Avoiding tobacco and drug use.  Limiting alcohol use. Practicing safe sex. Taking low-dose aspirin every day. Taking vitamin and mineral supplements as recommended by your health care provider. What happens during an annual well check? The services and screenings done by your health care provider during your annual well check will depend on your age, overall health, lifestyle risk factors, and family history of disease. Counseling  Your health care provider may ask you questions about your: Alcohol use. Tobacco use. Drug use. Emotional well-being. Home and relationship well-being. Sexual activity. Eating habits. History of falls. Memory and ability to understand (cognition). Work and work Astronomer. Reproductive health. Screening  You may have the following tests or measurements: Height, weight, and BMI. Blood pressure. Lipid and cholesterol levels. These may be checked every 5 years, or more frequently if you are over 65 years old. Skin check. Lung cancer screening. You may have this screening every year starting at age 34 if you have a  30-pack-year history of smoking and currently smoke or have quit within the past 15 years. Fecal occult blood test (FOBT) of the stool. You may have this test every year starting at age 59. Flexible sigmoidoscopy or colonoscopy. You may have a sigmoidoscopy every 5 years or a colonoscopy every 10 years starting at age 17. Hepatitis C blood test. Hepatitis B blood test. Sexually transmitted disease (STD) testing. Diabetes screening. This is done by checking your blood sugar (glucose) after you have not eaten for a while (fasting). You may have this done every 1-3 years. Bone density scan. This is done to screen for osteoporosis. You may have this done starting at age 93. Mammogram. This may be done every 1-2 years. Talk to your health care provider about how often you should have regular mammograms. Talk with your health care provider about your test results, treatment options, and if necessary, the need for more tests. Vaccines  Your health care provider may recommend certain vaccines, such as: Influenza vaccine. This is recommended every year. Tetanus, diphtheria, and acellular pertussis (Tdap, Td) vaccine. You may need a Td booster every 10 years. Zoster vaccine. You may need this after age 55. Pneumococcal 13-valent conjugate (PCV13) vaccine. One dose is recommended after age 38. Pneumococcal polysaccharide (PPSV23) vaccine. One dose is recommended after age 52. Talk to your health care provider about which screenings and vaccines you need and how often you need them. This information is not intended to replace advice given to you by your health care provider. Make sure you discuss any questions you have with your health care provider. Document Released: 06/17/2015 Document Revised: 02/08/2016 Document Reviewed: 03/22/2015 Elsevier Interactive Patient Education  2017 [employer].  Fall Prevention in the Home Falls can cause injuries. They can happen to people of all ages. There are many  things you can do to make your home safe and to help prevent falls. What can I do on the outside of my home? Regularly fix the edges of walkways and driveways and fix any cracks. Remove anything that might make you trip as you walk through a door, such as a raised step or threshold. Trim any bushes or trees on the path to your home. Use bright outdoor lighting. Clear any walking paths of anything that might make someone trip, such as rocks or tools. Regularly check to see if handrails are loose or broken. Make sure that both sides of any steps have handrails. Any raised decks and porches should have guardrails on the edges. Have any leaves, snow, or ice cleared regularly.  Use sand or salt on walking paths during winter. Clean up any spills in your garage right away. This includes oil or grease spills. What can I do in the bathroom? Use night lights. Install grab bars by the toilet and in the tub and shower. Do not use towel bars as grab bars. Use non-skid mats or decals in the tub or shower. If you need to sit down in the shower, use a plastic, non-slip stool. Keep the floor dry. Clean up any water that spills on the floor as soon as it happens. Remove soap buildup in the tub or shower regularly. Attach bath mats securely with double-sided non-slip rug tape. Do not have throw rugs and other things on the floor that can make you trip. What can I do in the bedroom? Use night lights. Make sure that you have a light by your bed that is easy to reach. Do not use any sheets or blankets that are too big for your bed. They should not hang down onto the floor. Have a firm chair that has side arms. You can use this for support while you get dressed. Do not have throw rugs and other things on the floor that can make you trip. What can I do in the kitchen? Clean up any spills right away. Avoid walking on wet floors. Keep items that you use a lot in easy-to-reach places. If you need to reach  something above you, use a strong step stool that has a grab bar. Keep electrical cords out of the way. Do not use floor polish or wax that makes floors slippery. If you must use wax, use non-skid floor wax. Do not have throw rugs and other things on the floor that can make you trip. What can I do with my stairs? Do not leave any items on the stairs. Make sure that there are handrails on both sides of the stairs and use them. Fix handrails that are broken or loose. Make sure that handrails are as long as the stairways. Check any carpeting to make sure that it is firmly attached to the stairs. Fix any carpet that is loose or worn. Avoid having throw rugs at the top or bottom of the stairs. If you do have throw rugs, attach them to the floor with carpet tape. Make sure that you have a light switch at the top of the stairs and the bottom of the stairs. If you do not have them, ask someone to add them for you. What else can I do to help prevent falls? Wear shoes that: Do not have high heels. Have rubber bottoms. Are comfortable and fit you well. Are closed at the toe. Do not wear sandals. If you use a stepladder: Make sure that it is fully opened. Do not climb a closed stepladder. Make sure that both sides of the stepladder are locked into place. Ask someone to hold it for you, if possible. Clearly mark and make sure that you can see: Any grab bars or handrails. First and last steps. Where the edge of each step is. Use tools that help you move around (mobility aids) if they are needed. These include: Canes. Walkers. Scooters. Crutches. Turn on the lights when you go into a dark area. Replace any light bulbs as soon as they burn out. Set up your furniture so you have a clear path. Avoid moving your furniture around. If any of your floors are uneven, fix them. If there are any pets around you, be  aware of where they are. Review your medicines with your doctor. Some medicines can make you  feel dizzy. This can increase your chance of falling. Ask your doctor what other things that you can do to help prevent falls. This information is not intended to replace advice given to you by your health care provider. Make sure you discuss any questions you have with your health care provider. Document Released: 03/17/2009 Document Revised: 10/27/2015 Document Reviewed: 06/25/2014 Elsevier Interactive Patient Education  2017 [employer].

## 2022-12-21 ENCOUNTER — Telehealth: Payer: Self-pay

## 2022-12-21 NOTE — Telephone Encounter (Signed)
Called spoke with pt daughter advised that MD has written a letter releasing her back to exercise.  Letter placed on my chart for review.  Daughter had no questions or concerns.

## 2022-12-24 ENCOUNTER — Encounter: Payer: Medicare PPO | Admitting: Psychology

## 2022-12-24 ENCOUNTER — Telehealth: Payer: Self-pay | Admitting: Physician Assistant

## 2022-12-24 NOTE — Telephone Encounter (Signed)
I left message to call back or I will try in a bit.

## 2022-12-24 NOTE — Telephone Encounter (Signed)
Patient's daughter Gates Rigg thompason) called stating that she would like a call back concerning patient. [phone]

## 2023-01-01 DIAGNOSIS — R0982 Postnasal drip: Secondary | ICD-10-CM

## 2023-01-01 DIAGNOSIS — Z6829 Body mass index (BMI) 29.0-29.9, adult: Secondary | ICD-10-CM | POA: Insufficient documentation

## 2023-01-01 DIAGNOSIS — E663 Overweight: Secondary | ICD-10-CM | POA: Insufficient documentation

## 2023-01-01 DIAGNOSIS — R413 Other amnesia: Secondary | ICD-10-CM | POA: Insufficient documentation

## 2023-01-01 HISTORY — DX: Postnasal drip: R09.82

## 2023-01-01 NOTE — Assessment & Plan Note (Signed)
Chronic, also followed by Pulmonary. As per Pulmonary, clinically in long-term remission

## 2023-01-01 NOTE — Assessment & Plan Note (Signed)
She was given samples of Zyrtec 5mg  to take 1/2 pill daily. Daughter verbally acknowledges treatment plan.

## 2023-01-01 NOTE — Assessment & Plan Note (Addendum)
Chronic, well controlled. Continue amlodipine 10mg  daily and valsartan 80mg  daily.  She is encouraged to follow low sodium diet.

## 2023-01-01 NOTE — Assessment & Plan Note (Addendum)
Chronic, she is currently on Jardiance 10mg  daily. She is encouraged to stay well hydrated while on this medication. She will f/u in four months for re-evaluation.

## 2023-01-01 NOTE — Assessment & Plan Note (Signed)
Chronic, she will continue with atorvastatin 40mg  daily.  She is encouraged to follow a heart healthy lifestyle.

## 2023-01-01 NOTE — Assessment & Plan Note (Signed)
Her BMI is acceptable for her demographic. She is encouraged to aim for at least 150 minutes of exercise per week.

## 2023-01-01 NOTE — Assessment & Plan Note (Signed)
Chronic, she has been evaluated by Neurology. She is not on any medication at this time.

## 2023-01-06 ENCOUNTER — Encounter: Payer: Self-pay | Admitting: Physician Assistant

## 2023-01-07 NOTE — Telephone Encounter (Signed)
Patient daughter advised of appt and I apologized for not getting any messages from her, to always call if she doesn't hear back from anyone at the office.

## 2023-01-17 ENCOUNTER — Other Ambulatory Visit: Payer: Medicare PPO | Admitting: Pharmacist

## 2023-01-17 NOTE — Progress Notes (Signed)
 I have reviewed the pharmacist's encounter and agree with their documentation.   Catie Eppie Gibson, PharmD, BCACP, CPP [facility] [phone]

## 2023-01-17 NOTE — Progress Notes (Signed)
   01/17/2023 Name: Mariah Burns MRN: [id number] DOB: [birth year]  No chief complaint on file.   Mariah Burns is a 77 y.o. year old female who presented for a telephone visit.   They were referred to the pharmacist by their PCP for assistance in managing complex medication management.   Subjective:  Care Team: Primary Care Provider: Dorothyann Peng, MD ; Next Scheduled Visit: 03/21/23  Medication Access/Adherence  Current Pharmacy:  [facility] - [place], [place] - [address] [place] [address] Phone: [phone] Fax: [phone]  [id number] - [place], [place] - [address] AT [address] OF [facility] [address] [place] [address] Phone: [phone] Fax: [phone]   Patient reports affordability concerns with their medications: No  Patient reports access/transportation concerns to their pharmacy: No  Patient reports adherence concerns with their medications:  No     Hypertension:  Current medications: amlodipine 10 mg, valsartan 80 mg Medications previously tried:   Patient has a validated, automated, upper arm home BP cuff. Validated at [facility].  Current blood pressure readings readings (previous 5 days): Takes BP medications with breakfast, takes BP before medications when she first wakes up.   8/15: 143/73 mmHg, HR 63 > rechecked during telephone appt: 127/66 mmHg, HR 59  8/14: 133/67, HR 54 8/13: 136/68, HR 53 8/12: 126/69, HR 58 8/11: 133/74, HR 62  Patient denies hypotensive s/sx including dizziness, lightheadedness.  Patient denies hypertensive symptoms including headache, chest pain, shortness of breath  Drinks a lot of water during the day. Avoids caffeinated beverages.   Objective:  Lab Results  Component Value Date   HGBA1C 5.3 12/19/2022    Lab Results  Component Value Date   CREATININE 0.94 12/12/2022   BUN 12 12/12/2022   NA 142  12/12/2022   K 3.5 12/12/2022   CL 105 12/12/2022   CO2 21 12/12/2022    Lab Results  Component Value Date   CHOL 119 11/13/2022   HDL 53 11/13/2022   LDLCALC 53 11/13/2022   TRIG 57 11/13/2022   CHOLHDL 2.2 11/13/2022    Medications Reviewed Today   Medications were not reviewed in this encounter     Assessment/Plan:   Hypertension: - Currently controlled with several home readings below goal <130/80 mmHg - including reading checked during telephone appt. Although some pt reported SBP >130, suspect these were taken when her medications were wearing off. Educated pt that it is best to check her blood pressure at least one hour after taking her medications. Scr and potassium stable on most recent BMP 12/12/22 after starting valsartan.  - Reviewed long term cardiovascular and renal outcomes of uncontrolled blood pressure - Reviewed appropriate blood pressure monitoring technique and reviewed goal blood pressure. Recommended to check home blood pressure and heart rate once daily after taking blood pressure medications.  - Recommend to continue current regimen: amlodipine 10 mg daily, valsartan 80 mg daily, empagliflozin 10 mg daily (HFpEF) - Patient appropriately treated with high intensity statin and ezetimibe: continue current regimen.  Follow Up Plan: No further pharmacist f/u needed, PCP 03/21/23  Nils Pyle, PharmD PGY1 Pharmacy Resident

## 2023-02-08 ENCOUNTER — Other Ambulatory Visit: Payer: Self-pay | Admitting: Internal Medicine

## 2023-02-08 DIAGNOSIS — I10 Essential (primary) hypertension: Secondary | ICD-10-CM

## 2023-02-20 DIAGNOSIS — B351 Tinea unguium: Secondary | ICD-10-CM | POA: Diagnosis not present

## 2023-02-20 DIAGNOSIS — I739 Peripheral vascular disease, unspecified: Secondary | ICD-10-CM | POA: Diagnosis not present

## 2023-02-20 DIAGNOSIS — M792 Neuralgia and neuritis, unspecified: Secondary | ICD-10-CM | POA: Diagnosis not present

## 2023-02-20 DIAGNOSIS — L603 Nail dystrophy: Secondary | ICD-10-CM | POA: Diagnosis not present

## 2023-02-20 DIAGNOSIS — G5762 Lesion of plantar nerve, left lower limb: Secondary | ICD-10-CM | POA: Diagnosis not present

## 2023-02-20 DIAGNOSIS — M722 Plantar fascial fibromatosis: Secondary | ICD-10-CM | POA: Diagnosis not present

## 2023-02-20 DIAGNOSIS — E1142 Type 2 diabetes mellitus with diabetic polyneuropathy: Secondary | ICD-10-CM | POA: Diagnosis not present

## 2023-02-20 DIAGNOSIS — M19079 Primary osteoarthritis, unspecified ankle and foot: Secondary | ICD-10-CM | POA: Diagnosis not present

## 2023-02-21 ENCOUNTER — Encounter: Payer: Self-pay | Admitting: Physician Assistant

## 2023-02-21 ENCOUNTER — Ambulatory Visit: Payer: Medicare PPO | Admitting: Physician Assistant

## 2023-02-21 VITALS — BP 146/70 | HR 68 | Resp 20 | Ht 59.0 in | Wt 147.0 lb

## 2023-02-21 DIAGNOSIS — R413 Other amnesia: Secondary | ICD-10-CM | POA: Diagnosis not present

## 2023-02-21 NOTE — Patient Instructions (Addendum)
It was a pleasure to see you today at our office.   Recommendations:  Neurocognitive evaluation at our office in January 2023  Continue CPAP Will entertain medicine pending on the diagnosis  Follow up in 4 month    For assessment of decision of mental capacity and competency:  Call Dr. Erick Blinks, geriatric psychiatrist at [phone]  Counseling regarding caregiver distress, including caregiver depression, anxiety and issues regarding community resources, adult day care programs, adult living facilities, or memory care questions:  please contact your  Primary Doctor's Social Worker   Whom to call: Memory  decline, memory medications: Call our office [phone]  For psychiatric meds, mood meds: Please have your primary care physician manage these medications.  If you have any severe symptoms of a stroke, or other severe issues such as confusion,severe chills or fever, etc call 911 or go to the ER as you may need to be evaluated further    RECOMMENDATIONS FOR ALL PATIENTS WITH MEMORY PROBLEMS: 1. Continue to exercise (Recommend 30 minutes of walking everyday, or 3 hours every week) 2. Increase social interactions - continue going to [place] and enjoy social gatherings with friends and family 3. Eat healthy, avoid fried foods and eat more fruits and vegetables 4. Maintain adequate blood pressure, blood sugar, and blood cholesterol level. Reducing the risk of stroke and cardiovascular disease also helps promoting better memory. 5. Avoid stressful situations. Live a simple life and avoid aggravations. Organize your time and prepare for the next day in anticipation. 6. Sleep well, avoid any interruptions of sleep and avoid any distractions in the bedroom that may interfere with adequate sleep quality 7. Avoid sugar, avoid sweets as there is a strong link between excessive sugar intake, diabetes, and cognitive impairment We discussed the Mediterranean diet, which has been shown to help  patients reduce the risk of progressive memory disorders and reduces cardiovascular risk. This includes eating fish, eat fruits and green leafy vegetables, nuts like almonds and hazelnuts, walnuts, and also use olive oil. Avoid fast foods and fried foods as much as possible. Avoid sweets and sugar as sugar use has been linked to worsening of memory function.  There is always a concern of gradual progression of memory problems. If this is the case, then we may need to adjust level of care according to patient needs. Support, both to the patient and caregiver, should then be put into place.      You have been referred for a neuropsychological evaluation (i.e., evaluation of memory and thinking abilities). Please bring someone with you to this appointment if possible, as it is helpful for the doctor to hear from both you and another adult who knows you well. Please bring eyeglasses and hearing aids if you wear them.    The evaluation will take approximately 3 hours and has two parts:   The first part is a clinical interview with the neuropsychologist (Dr. Milbert Coulter or Dr. Roseanne Reno). During the interview, the neuropsychologist will speak with you and the individual you brought to the appointment.    The second part of the evaluation is testing with the doctor's technician Annabelle Harman or Selena Batten). During the testing, the technician will ask you to remember different types of material, solve problems, and answer some questionnaires. Your family member will not be present for this portion of the evaluation.   Please note: We must reserve several hours of the neuropsychologist's time and the psychometrician's time for your evaluation appointment. As such, there is a No-Show fee  of $100. If you are unable to attend any of your appointments, please contact our office as soon as possible to reschedule.    FALL PRECAUTIONS: Be cautious when walking. Scan the area for obstacles that may increase the risk of trips and falls.  When getting up in the mornings, sit up at the edge of the bed for a few minutes before getting out of bed. Consider elevating the bed at the head end to avoid drop of blood pressure when getting up. Walk always in a well-lit room (use night lights in the walls). Avoid area rugs or power cords from appliances in the middle of the walkways. Use a walker or a cane if necessary and consider physical therapy for balance exercise. Get your eyesight checked regularly.  FINANCIAL OVERSIGHT: Supervision, especially oversight when making financial decisions or transactions is also recommended.  HOME SAFETY: Consider the safety of the kitchen when operating appliances like stoves, microwave oven, and blender. Consider having supervision and share cooking responsibilities until no longer able to participate in those. Accidents with firearms and other hazards in the house should be identified and addressed as well.   ABILITY TO BE LEFT ALONE: If patient is unable to contact 911 operator, consider using LifeLine, or when the need is there, arrange for someone to stay with patients. Smoking is a fire hazard, consider supervision or cessation. Risk of wandering should be assessed by caregiver and if detected at any point, supervision and safe proof recommendations should be instituted.  MEDICATION SUPERVISION: Inability to self-administer medication needs to be constantly addressed. Implement a mechanism to ensure safe administration of the medications.     Mediterranean Diet A Mediterranean diet refers to food and lifestyle choices that are based on the traditions of countries located on the [employer] [place]. This way of eating has been shown to help prevent certain conditions and improve outcomes for people who have chronic diseases, like kidney disease and heart disease. What are tips for following this plan? Lifestyle  Cook and eat meals together with your family, when possible. Drink enough fluid to keep  your urine clear or pale yellow. Be physically active every day. This includes: Aerobic exercise like running or swimming. Leisure activities like gardening, walking, or housework. Get 7-8 hours of sleep each night. If recommended by your health care provider, drink red wine in moderation. This means 1 glass a day for nonpregnant women and 2 glasses a day for men. A glass of wine equals 5 oz (150 mL). Reading food labels  Check the serving size of packaged foods. For foods such as rice and pasta, the serving size refers to the amount of cooked product, not dry. Check the total fat in packaged foods. Avoid foods that have saturated fat or trans fats. Check the ingredients list for added sugars, such as corn syrup. Shopping  At the grocery store, buy most of your food from the areas near the walls of the store. This includes: Fresh fruits and vegetables (produce). Grains, beans, nuts, and seeds. Some of these may be available in unpackaged forms or large amounts (in bulk). Fresh seafood. Poultry and eggs. Low-fat dairy products. Buy whole ingredients instead of prepackaged foods. Buy fresh fruits and vegetables in-season from local farmers markets. Buy frozen fruits and vegetables in resealable bags. If you do not have access to quality fresh seafood, buy precooked frozen shrimp or canned fish, such as tuna, salmon, or sardines. Buy small amounts of raw or cooked vegetables, salads, or olives  from the deli or salad bar at your store. Stock your pantry so you always have certain foods on hand, such as olive oil, canned tuna, canned tomatoes, rice, pasta, and beans. Cooking  Cook foods with extra-virgin olive oil instead of using butter or other vegetable oils. Have meat as a side dish, and have vegetables or grains as your main dish. This means having meat in small portions or adding small amounts of meat to foods like pasta or stew. Use beans or vegetables instead of meat in common dishes  like chili or lasagna. Experiment with different cooking methods. Try roasting or broiling vegetables instead of steaming or sauteing them. Add frozen vegetables to soups, stews, pasta, or rice. Add nuts or seeds for added healthy fat at each meal. You can add these to yogurt, salads, or vegetable dishes. Marinate fish or vegetables using olive oil, lemon juice, garlic, and fresh herbs. Meal planning  Plan to eat 1 vegetarian meal one day each week. Try to work up to 2 vegetarian meals, if possible. Eat seafood 2 or more times a week. Have healthy snacks readily available, such as: Vegetable sticks with hummus. Greek yogurt. Fruit and nut trail mix. Eat balanced meals throughout the week. This includes: Fruit: 2-3 servings a day Vegetables: 4-5 servings a day Low-fat dairy: 2 servings a day Fish, poultry, or lean meat: 1 serving a day Beans and legumes: 2 or more servings a week Nuts and seeds: 1-2 servings a day Whole grains: 6-8 servings a day Extra-virgin olive oil: 3-4 servings a day Limit red meat and sweets to only a few servings a month What are my food choices? Mediterranean diet Recommended Grains: Whole-grain pasta. Brown rice. Bulgar wheat. Polenta. Couscous. Whole-wheat bread. Orpah Cobb. Vegetables: Artichokes. Beets. Broccoli. Cabbage. Carrots. Eggplant. Green beans. Chard. Kale. Spinach. Onions. Leeks. Peas. Squash. Tomatoes. Peppers. Radishes. Fruits: Apples. Apricots. Avocado. Berries. Bananas. Cherries. Dates. Figs. Grapes. Lemons. Melon. Oranges. Peaches. Plums. Pomegranate. Meats and other protein foods: Beans. Almonds. Sunflower seeds. Pine nuts. Peanuts. Cod. Salmon. Scallops. Shrimp. Tuna. Tilapia. Clams. Oysters. Eggs. Dairy: Low-fat milk. Cheese. Greek yogurt. Beverages: Water. Red wine. Herbal tea. Fats and oils: Extra virgin olive oil. Avocado oil. Grape seed oil. Sweets and desserts: [place] yogurt with honey. Baked apples. Poached pears. Trail  mix. Seasoning and other foods: Basil. Cilantro. Coriander. Cumin. Mint. Parsley. Sage. Rosemary. Tarragon. Garlic. Oregano. Thyme. Pepper. Balsalmic vinegar. Tahini. Hummus. Tomato sauce. Olives. Mushrooms. Limit these Grains: Prepackaged pasta or rice dishes. Prepackaged cereal with added sugar. Vegetables: Deep fried potatoes (french fries). Fruits: Fruit canned in syrup. Meats and other protein foods: Beef. Pork. Lamb. Poultry with skin. Hot dogs. Tomasa Blase. Dairy: Ice cream. Sour cream. Whole milk. Beverages: Juice. Sugar-sweetened soft drinks. Beer. Liquor and spirits. Fats and oils: Butter. Canola oil. Vegetable oil. Beef fat (tallow). Lard. Sweets and desserts: Cookies. Cakes. Pies. Candy. Seasoning and other foods: Mayonnaise. Premade sauces and marinades. The items listed may not be a complete list. Talk with your dietitian about what dietary choices are right for you. Summary The Mediterranean diet includes both food and lifestyle choices. Eat a variety of fresh fruits and vegetables, beans, nuts, seeds, and whole grains. Limit the amount of red meat and sweets that you eat. Talk with your health care provider about whether it is safe for you to drink red wine in moderation. This means 1 glass a day for nonpregnant women and 2 glasses a day for men. A glass of wine equals 5 oz (150  mL). This information is not intended to replace advice given to you by your health care provider. Make sure you discuss any questions you have with your health care provider. Document Released: 01/12/2016 Document Revised: 02/14/2016 Document Reviewed: 01/12/2016 Elsevier Interactive Patient Education  2017 [employer].    [facility] [phone]

## 2023-02-21 NOTE — Progress Notes (Signed)
Assessment/Plan:   Memory Impairment   Mariah Burns is a very pleasant 77 y.o. RH female with a history of hypertension, hyperlipidemia,  Pre-Diabetes,  arthritis, vitamin D deficiency, OSA on CPAP, sarcoidosis, history of right breast cancer, CKD stage II, depression, chronic diastolic heart failure  presenting today in follow-up to discuss the MRI brain results June 2024 remarkable for mild chronic small vessel ischemic changes in the cerebral white matter and pons, similar to prior MRI in 2022, mild cerebellar atrophy ,no acute findings. . She is not on antidementia medication. Discussed with patient and family, who is keen to wait till the scheduled neuropsychological testing and diagnosis before starting a new medicine, unless memory worsened significantly prior to that test.  This patient is accompanied in the office by her daughters  who supplement  the history. Previous records as well as any outside records available were reviewed prior to todays visit.  Patient was last seen on May 2024 with [facility] 20/30       Recommendations:   Follow up in 5-6  months. Patient is scheduled for neuropsych evaluation for clarity of diagnosis on January 2025 Continue CPAP for OSA Recommend good control of cardiovascular risk factors Continue to control mood as per PCP      Initial visit 10/2022  How long did patient have memory difficulties? For the last 1.5 y-per daughter's report.  Patient has some difficulty remembering recent conversations and people's names that she may know and faces.  She is a retired Runner, broadcasting/film/video, and enjoys doing word finding, and other brain activities. repeats oneself?  Endorsed Disoriented when walking into a room?  Patient denies  Leaving objects in unusual places?  Patient denies, but endorsed by her daughter. Accidentally left the phone in the refrigerator (but a while ago).  She lost her glasses and could not find them, had to get new ones. Wandering behavior?   denies   Any personality changes?  Patient denies "but she curbs her condition with laughter and makes fun of herself"-daughter says Any history of depression?:  Endorsed, "many years ago, had psychotherapy "( but she does not elaborate). Daughter reports that she has anxiety, because she takes care of everybody else.  Hallucinations or paranoia?  Patient denies   Seizures?   Patient denies    Any sleep changes?  Not sleeping well, wakes up at 3-4 then does not go back to sleep easily.  Denies vivid dreams, REM behavior or sleepwalking.   Sleep apnea?  Uses CPAP "just got a new machine". Any hygiene concerns?  Patient denies   Independent of bathing and dressing?  Endorsed  Does the patient needs help with medications? Patient  is in charge, daughter monitors.   Who is in charge of the finances? Daughter is in charge     Any changes in appetite?  "Eating more than before because I am in the house ".      Patient have trouble swallowing? denies   Does the patient cook?  Sometimes, may have forgotten common recipes because "she has not been using them in a long time ". Any kitchen accidents such as leaving the stove on? denies   Any headaches?   denies   Chronic back pain ? denies   Ambulates with difficulty?  denies   Recent falls or head injuries? denies   Vision changes? denies   Unilateral weakness, numbness or tingling? denies   Any tremors?   denies   Any anosmia?  denies  Any incontinence of urine? Goes frequently because "I drink a lot of water ".  Any bowel dysfunction? denies      Patient lives with her daughter History of heavy alcohol intake? denies   History of heavy tobacco use? denies   Family history of dementia? Her sister has dementia.  Does patient drive? No, stopped 1 year ago,"my heart doctor told me to, because it may make me dizzy"   Pertinent labs April 2024 TSH 0.742 with T4 0.9, B12 1.280, normal CBC, unremarkable c-Met, LDL 112, otherwise normal lipid profile,  A1c 6.2  MRI of the brain without contrast, personally reviewed June 2024 remarkable for mild chronic small vessel ischemic changes in the cerebral white matter and pons, similar to prior MRI in 2022.  Mild cerebellar atrophy was noted.  No acute findings.  Past Medical History:  Diagnosis Date   Abdominal bloating 12/14/2022   Arthritis    Chronic bilateral low back pain without sciatica 10/28/2019   Chronic diastolic heart failure 06/06/2021   Class 1 obesity due to excess calories with serious comorbidity and body mass index (BMI) of 30.0 to 30.9 in adult 10/28/2019   Cyst of right breast 01/30/2017   1 cm     Dry mouth 11/19/2020   She is encouraged to increase fluid intake and to consider use of Biotene products. Also advised to increase her intake of healthy fats - walnuts, avocado etc.      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   Mild neurocognitive disorder 02/23/2022   Obstructive sleep apnea 03/21/2007   NPSG 03/08/2010  AHI 19.8/hr            Osteopenia    Pericardial effusion 02/17/2015   Moderate circumferential effusion 2004 by echo     Personal history of colonic polyps    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 We discussed the use of Trintellix, she declines at this time.      Stage 2 chronic kidney disease 05/15/2018   Type 2 diabetes mellitus, without long-term current use of insulin 05/06/2010   Vitamin D deficiency 10/28/2019     Past Surgical History:  Procedure Laterality Date   BREAST LUMPECTOMY WITH RADIOACTIVE SEED AND SENTINEL LYMPH NODE BIOPSY Right 09/02/2018   Procedure: RIGHT BREAST LUMPECTOMY WITH RADIOACTIVE SEED AND  RIGHT SENTINEL LYMPH NODE BIOPSY;  Surgeon: Glenna Fellows, MD;  Location: [facility];  Service: General;  Laterality: Right;   BRONCHOSCOPY     dx'd sarcoid   KNEE ARTHROPLASTY Left 03/13/2017   NEUROPLASTY / TRANSPOSITION ULNAR NERVE AT ELBOW  09/2011   TOTAL ABDOMINAL HYSTERECTOMY  PREVIOUS MEDICATIONS:   CURRENT MEDICATIONS:  Outpatient Encounter Medications as of 02/21/2023  Medication Sig   amLODipine (NORVASC) 10 MG tablet Take 1 tablet (10 mg total) by mouth daily.   ascorbic acid (VITAMIN C) 500 MG tablet Take 500 mg by mouth daily.   aspirin EC 81 MG tablet Take 81 mg by mouth daily. Swallow whole.   atorvastatin (LIPITOR) 40 MG tablet TAKE ONE TABLET BY MOUTH DAILY EACH MONDAY-FRIDAY. SKIP WEEKENDS.   Cholecalciferol (VITAMIN D-3 PO) Take 5,000 Units by mouth daily.   empagliflozin (JARDIANCE) 10 MG TABS tablet Take 1 tablet (10 mg total) by mouth daily before breakfast.   ezetimibe (ZETIA) 10 MG tablet Take 1 tablet (10 mg total) by mouth daily.   Fluocinolone Acetonide Scalp 0.01 % OIL Apply to affected area of scalp as directed 1-2 x/day as needed for itching   Menthol, Topical Analgesic, (BIOFREEZE ROLL-ON EX) Apply 1 application. topically as needed (pain in back,neck,thumb).   nitroGLYCERIN (NITROSTAT) 0.4 MG SL tablet Place 1 tablet (0.4 mg total) under the tongue every 5 (five) minutes as needed for chest pain. (Patient not  taking: Reported on 12/19/2022)   nystatin (MYCOSTATIN/NYSTOP) powder as needed (skin irritation).   Polyvinyl Alcohol-Povidone PF 1.4-0.6 % SOLN Place 1 drop into both eyes daily as needed (dry eyes).    RESTASIS 0.05 % ophthalmic emulsion as needed (dry eyes).   TART CHERRY PO daily at 6 (six) AM.   valsartan (DIOVAN) 80 MG tablet Take 1 tablet (80 mg total) by mouth daily.   Facility-Administered Encounter Medications as of 02/21/2023  Medication   0.9 %  sodium chloride infusion        10/24/2022    9:00 AM  Montreal Cognitive Assessment   Visuospatial/ Executive (0/5) 4  Naming (0/3) 3  Attention: Read list of digits (0/2) 2  Attention: Read list of letters (0/1) 1  Attention: Serial 7 subtraction starting at 100 (0/3) 0  Language: Repeat phrase (0/2) 2  Language : Fluency (0/1) 1  Abstraction (0/2) 2  Delayed Recall (0/5) 1  Orientation (0/6) 4  Total 20  Adjusted Score (based on education) 20       05/23/2021   10:21 AM  MMSE - Mini Mental State Exam  Orientation to time 5  Orientation to Place 4  Registration 3  Attention/ Calculation 3  Recall 1  Language- name 2 objects 2  Language- repeat 0  Language- follow 3 step command 3  Language- read & follow direction 1  Write a sentence 1  Copy design 1  Total score 24        Thank you for allowing [place] the opportunity to participate in the care of this nice patient. Please do not hesitate to contact us for any questions or concerns.   Total time spent on today's visit was 43 minutes dedicated to this patient today, preparing to see patient, examining the patient, ordering tests and/or medications and counseling the patient, documenting clinical information in the EHR or other health record, independently interpreting results and communicating results to the patient/family, discussing treatment and goals, answering patient's questions and coordinating care.  Cc:  Dorothyann Peng, MD  Marlowe Kays 02/21/2023 6:40 PM

## 2023-02-22 ENCOUNTER — Encounter: Payer: Self-pay | Admitting: Internal Medicine

## 2023-02-22 ENCOUNTER — Other Ambulatory Visit: Payer: Self-pay | Admitting: Internal Medicine

## 2023-02-25 MED ORDER — EMPAGLIFLOZIN 10 MG PO TABS: 10.0000 mg | ORAL_TABLET | Freq: Every day | ORAL | 2 refills | Status: DC

## 2023-02-28 NOTE — Progress Notes (Signed)
Office Visit Note  Patient: Mariah Burns             Date of Birth: 04-Jul-1945           MRN: [id number]             PCP: Dorothyann Peng, MD Referring: Dorothyann Peng, MD Visit Date: 03/13/2023 Occupation: @GUAROCC @  Subjective:  Pain in joints  History of Present Illness: Mariah Burns is a 77 y.o. female with pulmonary sarcoidosis, osteoarthritis and degenerative disc disease.  She denies any increased shortness of breath.  She has an appointment coming up with Dr. Maple Hudson in December.  She continues to have discomfort in her both hands, bilateral trochanteric area and knee joints.  She also has discomfort in her feet and cervical spine.  She has not noticed any joint swelling.  Patient states that few months ago she developed pain and discomfort in her right shoulder.  She was seen by an orthopedic surgeon and had cortisone injection in her right shoulder joint.  She has noticed some improvement but she still have discomfort with raising her arm.  She is been going to water exercises and walks on a regular basis.  She notices some discomfort in her hips when she walks.    Activities of Daily Living:  Patient reports morning stiffness for a few minutes.   Patient Reports nocturnal pain.  Difficulty dressing/grooming: Denies Difficulty climbing stairs: Reports Difficulty getting out of chair: Reports Difficulty using hands for taps, buttons, cutlery, and/or writing: Reports  Review of Systems  Constitutional:  Positive for fatigue.  HENT:  Positive for mouth dryness. Negative for mouth sores.   Eyes:  Positive for dryness.  Respiratory:  Negative for shortness of breath.   Cardiovascular:  Negative for chest pain and palpitations.  Gastrointestinal:  Negative for blood in stool, constipation and diarrhea.  Endocrine: Negative for increased urination.  Genitourinary:  Negative for involuntary urination.  Musculoskeletal:  Positive for joint pain, gait problem, joint pain,  myalgias, morning stiffness, muscle tenderness and myalgias. Negative for joint swelling and muscle weakness.  Skin:  Negative for color change, rash, hair loss and sensitivity to sunlight.  Allergic/Immunologic: Negative for susceptible to infections.  Neurological:  Positive for numbness. Negative for dizziness and headaches.  Hematological:  Negative for swollen glands.  Psychiatric/Behavioral:  Positive for depressed mood and sleep disturbance. The patient is nervous/anxious.     PMFS History:  Patient Active Problem List   Diagnosis Date Noted   Memory loss 01/01/2023   Postnasal drip 01/01/2023   Body mass index (BMI) of 29.0 to 29.9 in adult 01/01/2023   Arthritis    Dysphagia    Gastroesophageal reflux disease    History of colonic polyps    Generalized anxiety disorder 02/23/2022   Mild neurocognitive disorder 02/23/2022   Chronic diastolic heart failure 06/06/2021   Insomnia 03/30/2021   Pure hypercholesterolemia 03/07/2021   Situational depression 11/19/2020   Dry mouth 11/19/2020   Secondary and unspecified malignant neoplasm of axilla and upper limb lymph nodes 10/28/2019   Class 1 obesity due to excess calories with serious comorbidity and body mass index (BMI) of 30.0 to 30.9 in adult 10/28/2019   Vitamin D deficiency disease 10/28/2019   Chronic bilateral low back pain without sciatica 10/28/2019   Hip pain, bilateral 10/28/2019   Malignant neoplasm of upper-outer quadrant of right breast in female, estrogen receptor positive 08/11/2018   Stage 2 chronic kidney disease 05/15/2018   Hypertensive nephropathy  05/15/2018   Cyst of right breast 01/30/2017   Incomplete right bundle branch block 02/17/2015   Pericardial effusion 02/17/2015   Osteopenia 10/05/2011   Type 2 diabetes mellitus, without long-term current use of insulin 05/06/2010   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 Also advised to increase her intake of healthy fats - walnuts, avocado etc.      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   Mild neurocognitive disorder 02/23/2022   Obstructive sleep apnea 03/21/2007   NPSG 03/08/2010  AHI 19.8/hr            Osteopenia    Pericardial effusion 02/17/2015   Moderate circumferential effusion 2004 by echo     Personal history of colonic polyps    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 We discussed the use of Trintellix, she declines at this time.      Stage 2 chronic kidney disease 05/15/2018   Type 2 diabetes mellitus,  without long-term current use of insulin 05/06/2010   Vitamin D deficiency 10/28/2019    Family History  Problem Relation Age of Onset   Allergies Sister    Heart disease Sister    Lupus Sister    Allergies Mother    Allergies Daughter    Healthy Daughter    Heart disease Sister    Gout Brother    Hypertension Brother    Heart disease Brother    Heart failure Father    Gout Brother    Hypertension Brother    Hypertension Brother    Hypertension Brother    Post-traumatic stress disorder Brother    Healthy Daughter    Past Surgical History:  Procedure Laterality Date   BREAST LUMPECTOMY WITH RADIOACTIVE SEED AND SENTINEL LYMPH NODE BIOPSY Right 09/02/2018   Procedure: RIGHT BREAST LUMPECTOMY WITH RADIOACTIVE SEED AND  RIGHT SENTINEL LYMPH NODE BIOPSY;  Surgeon: Glenna Fellows, MD;  Location: [facility];  Service: General;  Laterality: Right;   BRONCHOSCOPY     dx'd sarcoid   KNEE ARTHROPLASTY Left 03/13/2017  NEUROPLASTY / TRANSPOSITION ULNAR NERVE AT ELBOW  09/2011   TOTAL ABDOMINAL HYSTERECTOMY     Social History   Social History Narrative   05/23/21 Patient lives at home with youngest daughter.   Caffeine Use: none   Right handed   One story home   retired   Financial risk analyst History  Administered Date(s) Administered   Fluad Quad(high Dose 65+) 02/12/2019, 02/23/2020, 03/30/2021, 03/13/2022   Influenza Split 04/10/2011, 04/15/2012, 03/04/2013, 03/04/2014, 03/05/2015, 03/04/2017   Influenza Whole 06/13/2010   Influenza,inj,Quad PF,6+ Mos 03/22/2016   Influenza-Unspecified 03/22/2016, 03/04/2018   Moderna Covid-19 Fall Seasonal Vaccine 69yrs & older 05/23/2022   PFIZER(Purple Top)SARS-COV-2 Vaccination 08/09/2019, 09/08/2019, 05/18/2020   Pneumococcal Conjugate-13 11/13/2017   Pneumococcal Polysaccharide-23 06/04/2009, 12/28/2010, 06/11/2019   Td 05/31/2008   Tdap 12/13/2018   Unspecified SARS-COV-2 Vaccination 09/08/2019   Zoster  Recombinant(Shingrix) 10/05/2021, 01/31/2022     Objective: Vital Signs: BP (!) 161/75 (BP Location: Left Arm, Patient Position: Sitting, Cuff Size: Normal)   Pulse (!) 56   Resp 12   Ht 4\' 11"  (1.499 m)   Wt 148 lb 9.6 oz (67.4 kg)   BMI 30.01 kg/m    Physical Exam Vitals and nursing note reviewed.  Constitutional:      Appearance: She is well-developed.  HENT:     Head: Normocephalic and atraumatic.  Eyes:     Conjunctiva/sclera: Conjunctivae normal.  Cardiovascular:     Rate and Rhythm: Normal rate and regular rhythm.     Heart sounds: Normal heart sounds.  Pulmonary:     Effort: Pulmonary effort is normal.     Breath sounds: Normal breath sounds.  Abdominal:     General: Bowel sounds are normal.     Palpations: Abdomen is soft.  Musculoskeletal:     Cervical back: Normal range of motion.  Lymphadenopathy:     Cervical: No cervical adenopathy.  Skin:    General: Skin is warm and dry.     Capillary Refill: Capillary refill takes less than 2 seconds.  Neurological:     Mental Status: She is alert and oriented to person, place, and time.  Psychiatric:        Behavior: Behavior normal.      Musculoskeletal Exam: She had good range of motion of the cervical spine.  She had no tenderness over thoracic and lumbar spine.  Right shoulder joint abduction was 120 degrees and she had limited internal rotation.  Left shoulder joint was in full range of motion.  Elbow joints, wrist joints, MCPs and PIPs were in good range of motion.  She had bilateral DIP thickening with no synovitis.  She had tenderness over left trochanteric bursa.  Hip joints and knee joints were in good range of motion without any warmth swelling or effusion.  There was no tenderness over ankles or MTPs.  CDAI Exam: CDAI Score: -- Patient Global: --; Provider Global: -- Swollen: --; Tender: -- Joint Exam 03/13/2023   No joint exam has been documented for this visit   There is currently no information  documented on the homunculus. Go to the Rheumatology activity and complete the homunculus joint exam.  Investigation: No additional findings.  Imaging: No results found.  Recent Labs: Lab Results  Component Value Date   WBC 4.9 09/24/2022   HGB 12.2 09/24/2022   PLT 201 09/24/2022   NA 142 12/12/2022   K 3.5 12/12/2022   CL 105 12/12/2022   CO2 21 12/12/2022   GLUCOSE 102 (H) 12/12/2022  BUN 12 12/12/2022   CREATININE 0.94 12/12/2022   BILITOT 0.5 08/29/2022   ALKPHOS 65 08/29/2022   AST 17 08/29/2022   ALT 14 08/29/2022   PROT 8.0 08/29/2022   ALBUMIN 4.4 08/29/2022   CALCIUM 9.6 12/12/2022   GFRAA 85 05/26/2020    Speciality Comments: No specialty comments available.  Procedures:  No procedures performed Allergies: Fosamax [alendronate sodium], Omeprazole, Motrin [ibuprofen], and Fluvastatin   Assessment / Plan:     Visit Diagnoses: Pulmonary sarcoidosis (HCC) - +RF, +ANA: Patient denies any increased shortness of breath.  She is followed by Dr. Maple Hudson.  She has an appointment coming up in December.  A chest x-ray from February 2024 showed chronic bilateral interstitial thickening.  Chest was clear to auscultation.  Chronic right shoulder pain-she reports discomfort in her right shoulder.  She had a cortisone injection few months back by the orthopedic surgeon.  She had limited range of motion.  A handout on shoulder exercises was given.  I advised her to contact us if she has ongoing problems with her shoulder after doing exercises at home.  I will also refer her to physical therapy.  Primary osteoarthritis of both hands-she had bilateral hand discomfort.  No synovitis was noted.  DIP thickening was noted.  Trochanteric bursitis, left hip - X-ray was unremarkable in the past.  She complains of left trochanteric bursitis when she walks.  She had tenderness on palpation.  A handout on IT band stretches was given.  I will also refer her to physical therapy.  Primary  osteoarthritis of both knees-she can have off-and-on discomfort in her knee joints.  No warmth swelling or effusion was noted.  Gait instability-she feels that she has gait instability.  She has a cane which she does not use on a regular basis.  Regular use of cane was advised.  I will also refer her to physical therapy for lower extremity muscle strengthening.  A handout on exercises was given.  DDD (degenerative disc disease), cervical-she had good range of motion without discomfort.  History of vitamin D deficiency-vitamin D was 31.8 in April 2024.  Regular use of calcium and vitamin D was advised.  Osteopenia of multiple sites - August 14, 2021 DEXA scan The BMD measured at Femur Neck Right is 0.795 g/cm2 with a score of -1.7.  Followed by Dr. Allyne Gee.  History of hypertension-blood pressure was 149/55.  Repeat blood pressure was 161/75.  Patient was advised to monitor blood pressure closely and follow-up with her PCP.  History of diabetes mellitus  History of hyperlipidemia  History of sleep apnea - Uses CPAP machine  Malignant neoplasm of upper-outer quadrant of right breast in female, estrogen receptor positive (HCC) - 2020 s/p lumpectomy and RTX.  She is followed by oncology.  Orders: Orders Placed This Encounter  Procedures   Ambulatory referral to Physical Therapy   No orders of the defined types were placed in this encounter.    Follow-Up Instructions: Return in about 1 year (around 03/12/2024) for Osteoarthritis, Sarcoidosis.   Pollyann Savoy, MD  Note - This record has been created using Animal nutritionist.  Chart creation errors have been sought, but may not always  have been located. Such creation errors do not reflect on  the standard of medical care.

## 2023-03-04 ENCOUNTER — Encounter: Payer: Self-pay | Admitting: Internal Medicine

## 2023-03-05 DIAGNOSIS — G4733 Obstructive sleep apnea (adult) (pediatric): Secondary | ICD-10-CM | POA: Diagnosis not present

## 2023-03-06 DIAGNOSIS — Z90711 Acquired absence of uterus with remaining cervical stump: Secondary | ICD-10-CM | POA: Diagnosis not present

## 2023-03-06 DIAGNOSIS — Z1331 Encounter for screening for depression: Secondary | ICD-10-CM | POA: Diagnosis not present

## 2023-03-06 DIAGNOSIS — M8580 Other specified disorders of bone density and structure, unspecified site: Secondary | ICD-10-CM | POA: Diagnosis not present

## 2023-03-06 DIAGNOSIS — Z1239 Encounter for other screening for malignant neoplasm of breast: Secondary | ICD-10-CM | POA: Diagnosis not present

## 2023-03-06 DIAGNOSIS — Z01419 Encounter for gynecological examination (general) (routine) without abnormal findings: Secondary | ICD-10-CM | POA: Diagnosis not present

## 2023-03-06 DIAGNOSIS — Z1211 Encounter for screening for malignant neoplasm of colon: Secondary | ICD-10-CM | POA: Diagnosis not present

## 2023-03-06 DIAGNOSIS — Z139 Encounter for screening, unspecified: Secondary | ICD-10-CM | POA: Diagnosis not present

## 2023-03-13 ENCOUNTER — Ambulatory Visit: Payer: Medicare PPO | Attending: Rheumatology | Admitting: Rheumatology

## 2023-03-13 ENCOUNTER — Encounter: Payer: Self-pay | Admitting: Rheumatology

## 2023-03-13 VITALS — BP 161/75 | HR 56 | Resp 12 | Ht 59.0 in | Wt 148.6 lb

## 2023-03-13 DIAGNOSIS — Z170 Estrogen receptor positive status [ER+]: Secondary | ICD-10-CM

## 2023-03-13 DIAGNOSIS — M5030 Other cervical disc degeneration, unspecified cervical region: Secondary | ICD-10-CM

## 2023-03-13 DIAGNOSIS — M7062 Trochanteric bursitis, left hip: Secondary | ICD-10-CM

## 2023-03-13 DIAGNOSIS — Z8639 Personal history of other endocrine, nutritional and metabolic disease: Secondary | ICD-10-CM

## 2023-03-13 DIAGNOSIS — M19041 Primary osteoarthritis, right hand: Secondary | ICD-10-CM | POA: Diagnosis not present

## 2023-03-13 DIAGNOSIS — M19042 Primary osteoarthritis, left hand: Secondary | ICD-10-CM

## 2023-03-13 DIAGNOSIS — M25511 Pain in right shoulder: Secondary | ICD-10-CM

## 2023-03-13 DIAGNOSIS — C50411 Malignant neoplasm of upper-outer quadrant of right female breast: Secondary | ICD-10-CM

## 2023-03-13 DIAGNOSIS — R2681 Unsteadiness on feet: Secondary | ICD-10-CM | POA: Diagnosis not present

## 2023-03-13 DIAGNOSIS — G8929 Other chronic pain: Secondary | ICD-10-CM

## 2023-03-13 DIAGNOSIS — M8589 Other specified disorders of bone density and structure, multiple sites: Secondary | ICD-10-CM | POA: Diagnosis not present

## 2023-03-13 DIAGNOSIS — Z8669 Personal history of other diseases of the nervous system and sense organs: Secondary | ICD-10-CM

## 2023-03-13 DIAGNOSIS — Z8679 Personal history of other diseases of the circulatory system: Secondary | ICD-10-CM

## 2023-03-13 DIAGNOSIS — M170 Bilateral primary osteoarthritis of knee: Secondary | ICD-10-CM

## 2023-03-13 DIAGNOSIS — D860 Sarcoidosis of lung: Secondary | ICD-10-CM | POA: Diagnosis not present

## 2023-03-13 DIAGNOSIS — M25552 Pain in left hip: Secondary | ICD-10-CM

## 2023-03-13 NOTE — Patient Instructions (Addendum)
Iliotibial Band Syndrome Rehab Ask your health care provider which exercises are safe for you. Do exercises exactly as told by your provider and adjust them as told. It's normal to feel mild stretching, pulling, tightness, or discomfort as you do these exercises. Stop right away if you feel sudden pain or your pain gets a lot worse. Do not begin these exercises until told by your provider. Stretching and range-of-motion exercises These exercises warm up your muscles and joints. They also improve the movement and flexibility of your hip and pelvis. Quadriceps stretch, prone  Lie face down (prone) on a firm surface like a bed or padded floor. Bend your left / right knee. Reach back to hold your ankle or pant leg. If you can't reach your ankle or pant leg, use a belt looped around your foot and grab the belt instead. Gently pull your heel toward your butt. Your knee should not slide out to the side. You should feel a stretch in the front of your thigh and knee, also called the quadriceps. Hold this position for __________ seconds. Repeat __________ times. Complete this exercise __________ times a day. Iliotibial band stretch The iliotibial band is a strip of tissue that runs along the outside of your hip down to your knee. Lie on your side with your left / right leg on top. Bend both knees and grab your left / right ankle. Stretch out your bottom arm to help you balance. Slowly bring your top knee back so your thigh goes behind your back. Slowly lower your top leg toward the floor until you feel a gentle stretch on the outside of your left / right hip and thigh. If you don't feel a stretch and your knee won't go farther, place the heel of your other foot on top of your knee and pull your knee down toward the floor with your foot. Hold this position for __________ seconds. Repeat __________ times. Complete this exercise __________ times a day. Strengthening exercises These exercises build strength  and endurance in your hip and pelvis. Endurance means your muscles can keep working even when they're tired. Straight leg raises, side-lying This exercise strengthens the muscles that rotate the leg at the hip and move it away from your body. These muscles are called hip abductors. Lie on your side with your left / right leg on top. Lie so your head, shoulder, hip, and knee line up. You can bend your bottom knee to help you balance. Roll your hips slightly forward so they're stacked directly over each other. Your left / right knee should face forward. Tense the muscles in your outer thigh and hip. Lift your top leg 4-6 inches (10-15 cm) off the ground. Hold this position for __________ seconds. Slowly lower your leg back down to the starting position. Let your muscles fully relax before doing this exercise again. Repeat __________ times. Complete this exercise __________ times a day. Leg raises, prone This exercise strengthens the muscles that move the hips backward. These muscles are called hip extensors. Lie face down (prone) on your bed or a firm surface. You can put a pillow under your hips for comfort and to support your lower back. Bend your left / right knee so your foot points straight up toward the ceiling. Keep the other leg straight and behind you. Squeeze your butt muscles. Lift your left / right thigh off the firm surface. Do not let your back arch. Tense your thigh muscle as hard as you can without having  more knee pain. Hold this position for __________ seconds. Slowly lower your leg to the starting position. Allow your leg to relax all the way. Repeat __________ times. Complete this exercise __________ times a day. Hip hike  Stand sideways on a bottom step. Place your feet so that your left / right leg is on the step, and the other foot is hanging off the side. If you need support for balance, hold onto a railing or wall. Keep your knees straight and your abdomen square,  meaning your hips are level. Then, lift your left / right hip up toward the ceiling. Slowly let your leg that's hanging off the step lower towards the floor. Your foot should get closer to the ground. Do not lean or bend your knees during this movement. Repeat __________ times. Complete this exercise __________ times a day. This information is not intended to replace advice given to you by your health care provider. Make sure you discuss any questions you have with your health care provider. Document Revised: 08/03/2022 Document Reviewed: 08/03/2022 [employer] Patient Education  2024 [employer]  Exercises for Chronic Knee Pain Chronic knee pain is pain that lasts longer than 3 months. For most people with chronic knee pain, exercise and weight loss is an important part of treatment. Your health care provider may want you to focus on: Making the muscles that support your knee stronger. This can take pressure off your knee and reduce pain. Preventing knee stiffness. How far you can move your knee, keeping it there or making it farther. Losing weight (if this applies) to take pressure off your knee, lower your risk for injury, and make it easier for you to exercise. Your provider will help you make an exercise program that fits your needs and physical abilities. Below are simple, low-impact exercises you can do at home. Ask your provider or physical therapist how often you should do your exercise program and how many times to repeat each exercise. General safety tips  Get your provider's approval before doing any exercises. Start slowly and stop any time you feel pain. Do not exercise if your knee pain is flaring up. Warm up first. Stretching a cold muscle can cause an injury. Do 5-10 minutes of easy movement or light stretching before beginning your exercises. Do 5-10 minutes of low-impact activity (like walking or cycling) before starting strengthening exercises. Contact your provider any time  you have pain during or after exercising. Exercise can cause discomfort but should not be painful. It is normal to be a little stiff or sore after exercising. Stretching and range-of-motion exercises Front thigh stretch  Stand up straight and support your body by holding on to a chair or resting one hand on a wall. With your legs straight and close together, bend one knee to lift your heel up toward your butt. Using one hand for support, grab your ankle with your free hand. Pull your foot up closer toward your butt to feel the stretch in front of your thigh. Hold the stretch for 30 seconds. Repeat __________ times. Complete this exercise __________ times a day. Back thigh stretch  Sit on the floor with your back straight and your legs out straight in front of you. Place the palms of your hands on the floor and slide them toward your feet as you bend at the hip. Try to touch your nose to your knees and feel the stretch in the back of your thighs. Hold for 30 seconds. Repeat __________ times. Complete  this exercise __________ times a day. Calf stretch  Stand facing a wall. Place the palms of your hands flat against the wall, arms extended, and lean slightly against the wall. Get into a lunge position with one leg bent at the knee and the other leg stretched out straight behind you. Keep both feet facing the wall and increase the bend in your knee while keeping the heel of the other leg flat on the ground. You should feel the stretch in your calf. Hold for 30 seconds. Repeat __________ times. Complete this exercise __________ times a day. Strengthening exercises Straight leg lift  Lie on your back with one knee bent and the other leg out straight. Slowly lift the straight leg without bending the knee. Lift until your foot is about 12 inches (30 cm) off the floor. Hold for 3-5 seconds and slowly lower your leg. Repeat __________ times. Complete this exercise __________ times a  day. Single leg dip  Stand between two chairs and put both hands on the backs of the chairs for support. Extend one leg out straight with your body weight resting on the heel of the standing leg. Slowly bend your standing knee to dip your body to the level that is comfortable for you. Hold for 3-5 seconds. Repeat __________ times. Complete this exercise __________ times a day. Hamstring curls  Stand straight, knees close together, facing the back of a chair. Hold on to the back of a chair with both hands. Keep one leg straight. Bend the other knee while bringing the heel up toward the butt until the knee is bent at a 90-degree angle (right angle). Hold for 3-5 seconds. Repeat __________ times. Complete this exercise __________ times a day. Wall squat  Stand straight with your back, hips, and head against a wall. Step forward one foot at a time with your back still against the wall. Your feet should be 2 feet (61 cm) from the wall at shoulder width. Keeping your back, hips, and head against the wall, slide down the wall to as close to a sitting position as you can get. Hold for 5-10 seconds, then slowly slide back up. Repeat __________ times. Complete this exercise __________ times a day. Step-ups  Stand in front of a sturdy platform or stool that is about 6 inches (15 cm) high. Slowly step up with your left / right foot, keeping your knee in line with your hip and foot. Do not let your knee bend so far that you cannot see your toes. Hold on to a chair for balance, but do not use it for support. Slowly unlock your knee and lower yourself to the starting position. Repeat __________ times. Complete this exercise __________ times a day. Contact a health care provider if: Your exercises cause pain. Your pain is worse after you exercise. Your pain prevents you from doing your exercises. This information is not intended to replace advice given to you by your health care provider. Make sure  you discuss any questions you have with your health care provider. Document Revised: 06/05/2022 Document Reviewed: 06/05/2022 [employer] Patient Education  2024 [employer] Shoulder Exercises Ask your health care provider which exercises are safe for you. Do exercises exactly as told by your health care provider and adjust them as directed. It is normal to feel mild stretching, pulling, tightness, or discomfort as you do these exercises. Stop right away if you feel sudden pain or your pain gets worse. Do not begin these exercises until told by  your health care provider. Stretching exercises External rotation and abduction This exercise is sometimes called corner stretch. The exercise rotates your arm outward (external rotation) and moves your arm out from your body (abduction). Stand in a doorway with one of your feet slightly in front of the other. This is called a staggered stance. If you cannot reach your forearms to the door frame, stand facing a corner of a room. Choose one of the following positions as told by your health care provider: Place your hands and forearms on the door frame above your head. Place your hands and forearms on the door frame at the height of your head. Place your hands on the door frame at the height of your elbows. Slowly move your weight onto your front foot until you feel a stretch across your chest and in the front of your shoulders. Keep your head and chest upright and keep your abdominal muscles tight. Hold for __________ seconds. To release the stretch, shift your weight to your back foot. Repeat __________ times. Complete this exercise __________ times a day. Extension, standing  Stand and hold a broomstick, a cane, or a similar object behind your back. Your hands should be a little wider than shoulder-width apart. Your palms should face away from your back. Keeping your elbows straight and your shoulder muscles relaxed, move the stick away from your body  until you feel a stretch in your shoulders (extension). Avoid shrugging your shoulders while you move the stick. Keep your shoulder blades tucked down toward the middle of your back. Hold for __________ seconds. Slowly return to the starting position. Repeat __________ times. Complete this exercise __________ times a day. Range-of-motion exercises Pendulum  Stand near a wall or a surface that you can hold onto for balance. Bend at the waist and let your left / right arm hang straight down. Use your other arm to support you. Keep your back straight and do not lock your knees. Relax your left / right arm and shoulder muscles, and move your hips and your trunk so your left / right arm swings freely. Your arm should swing because of the motion of your body, not because you are using your arm or shoulder muscles. Keep moving your hips and trunk so your arm swings in the following directions, as told by your health care provider: Side to side. Forward and backward. In clockwise and counterclockwise circles. Continue each motion for __________ seconds, or for as long as told by your health care provider. Slowly return to the starting position. Repeat __________ times. Complete this exercise __________ times a day. Shoulder flexion, standing  Stand and hold a broomstick, a cane, or a similar object. Place your hands a little more than shoulder-width apart on the object. Your left / right hand should be palm-up, and your other hand should be palm-down. Keep your elbow straight and your shoulder muscles relaxed. Push the stick up with your healthy arm to raise your left / right arm in front of your body, and then over your head until you feel a stretch in your shoulder (flexion). Avoid shrugging your shoulder while you raise your arm. Keep your shoulder blade tucked down toward the middle of your back. Hold for __________ seconds. Slowly return to the starting position. Repeat __________ times.  Complete this exercise __________ times a day. Shoulder abduction, standing  Stand and hold a broomstick, a cane, or a similar object. Place your hands a little more than shoulder-width apart on the object.  Your left / right hand should be palm-up, and your other hand should be palm-down. Keep your elbow straight and your shoulder muscles relaxed. Push the object across your body toward your left / right side. Raise your left / right arm to the side of your body (abduction) until you feel a stretch in your shoulder. Do not raise your arm above shoulder height unless your health care provider tells you to do that. If directed, raise your arm over your head. Avoid shrugging your shoulder while you raise your arm. Keep your shoulder blade tucked down toward the middle of your back. Hold for __________ seconds. Slowly return to the starting position. Repeat __________ times. Complete this exercise __________ times a day. Internal rotation  Place your left / right hand behind your back, palm-up. Use your other hand to dangle an exercise band, a broomstick, or a similar object over your shoulder. Grasp the band with your left / right hand so you are holding on to both ends. Gently pull up on the band until you feel a stretch in the front of your left / right shoulder. The movement of your arm toward the center of your body is called internal rotation. Avoid shrugging your shoulder while you raise your arm. Keep your shoulder blade tucked down toward the middle of your back. Hold for __________ seconds. Release the stretch by letting go of the band and lowering your hands. Repeat __________ times. Complete this exercise __________ times a day. Strengthening exercises External rotation  Sit in a stable chair without armrests. Secure an exercise band to a stable object at elbow height on your left / right side. Place a soft object, such as a folded towel or a small pillow, between your left / right  upper arm and your body to move your elbow about 4 inches (10 cm) away from your side. Hold the end of the exercise band so it is tight and there is no slack. Keeping your elbow pressed against the soft object, slowly move your forearm out, away from your abdomen (external rotation). Keep your body steady so only your forearm moves. Hold for __________ seconds. Slowly return to the starting position. Repeat __________ times. Complete this exercise __________ times a day. Shoulder abduction  Sit in a stable chair without armrests, or stand up. Hold a __________ lb / kg weight in your left / right hand, or hold an exercise band with both hands. Start with your arms straight down and your left / right palm facing in, toward your body. Slowly lift your left / right hand out to your side (abduction). Do not lift your hand above shoulder height unless your health care provider tells you that this is safe. Keep your arms straight. Avoid shrugging your shoulder while you do this movement. Keep your shoulder blade tucked down toward the middle of your back. Hold for __________ seconds. Slowly lower your arm, and return to the starting position. Repeat __________ times. Complete this exercise __________ times a day. Shoulder extension  Sit in a stable chair without armrests, or stand up. Secure an exercise band to a stable object in front of you so it is at shoulder height. Hold one end of the exercise band in each hand. Straighten your elbows and lift your hands up to shoulder height. Squeeze your shoulder blades together as you pull your hands down to the sides of your thighs (extension). Stop when your hands are straight down by your sides. Do not let your  hands go behind your body. Hold for __________ seconds. Slowly return to the starting position. Repeat __________ times. Complete this exercise __________ times a day. Shoulder row  Sit in a stable chair without armrests, or stand up. Secure  an exercise band to a stable object in front of you so it is at chest height. Hold one end of the exercise band in each hand. Position your palms so that your thumbs are facing the ceiling (neutral position). Bend each of your elbows to a 90-degree angle (right angle) and keep your upper arms at your sides. Step back or move the chair back until the band is tight and there is no slack. Slowly pull your elbows back behind you. Hold for __________ seconds. Slowly return to the starting position. Repeat __________ times. Complete this exercise __________ times a day. Shoulder press-ups  Sit in a stable chair that has armrests. Sit upright, with your feet flat on the floor. Put your hands on the armrests so your elbows are bent and your fingers are pointing forward. Your hands should be about even with the sides of your body. Push down on the armrests and use your arms to lift yourself off the chair. Straighten your elbows and lift yourself up as much as you comfortably can. Move your shoulder blades down, and avoid letting your shoulders move up toward your ears. Keep your feet on the ground. As you get stronger, your feet should support less of your body weight as you lift yourself up. Hold for __________ seconds. Slowly lower yourself back into the chair. Repeat __________ times. Complete this exercise __________ times a day. Wall push-ups  Stand so you are facing a stable wall. Your feet should be about one arm-length away from the wall. Lean forward and place your palms on the wall at shoulder height. Keep your feet flat on the floor as you bend your elbows and lean forward toward the wall. Hold for __________ seconds. Straighten your elbows to push yourself back to the starting position. Repeat __________ times. Complete this exercise __________ times a day. This information is not intended to replace advice given to you by your health care provider. Make sure you discuss any questions  you have with your health care provider. Document Revised: 07/11/2021 Document Reviewed: 07/11/2021 [employer] Patient Education  2024 [employer].

## 2023-03-21 ENCOUNTER — Ambulatory Visit: Payer: Medicare PPO | Admitting: Internal Medicine

## 2023-03-21 ENCOUNTER — Encounter: Payer: Self-pay | Admitting: Internal Medicine

## 2023-03-21 VITALS — BP 118/68 | HR 60 | Temp 98.3°F | Ht 59.0 in | Wt 144.4 lb

## 2023-03-21 DIAGNOSIS — Z6829 Body mass index (BMI) 29.0-29.9, adult: Secondary | ICD-10-CM | POA: Diagnosis not present

## 2023-03-21 DIAGNOSIS — N182 Chronic kidney disease, stage 2 (mild): Secondary | ICD-10-CM | POA: Diagnosis not present

## 2023-03-21 DIAGNOSIS — I129 Hypertensive chronic kidney disease with stage 1 through stage 4 chronic kidney disease, or unspecified chronic kidney disease: Secondary | ICD-10-CM | POA: Diagnosis not present

## 2023-03-21 DIAGNOSIS — E1122 Type 2 diabetes mellitus with diabetic chronic kidney disease: Secondary | ICD-10-CM | POA: Diagnosis not present

## 2023-03-21 DIAGNOSIS — Z23 Encounter for immunization: Secondary | ICD-10-CM

## 2023-03-21 DIAGNOSIS — R413 Other amnesia: Secondary | ICD-10-CM

## 2023-03-21 DIAGNOSIS — E663 Overweight: Secondary | ICD-10-CM | POA: Diagnosis not present

## 2023-03-21 DIAGNOSIS — H9193 Unspecified hearing loss, bilateral: Secondary | ICD-10-CM | POA: Diagnosis not present

## 2023-03-21 DIAGNOSIS — Z0000 Encounter for general adult medical examination without abnormal findings: Secondary | ICD-10-CM | POA: Diagnosis not present

## 2023-03-21 LAB — POCT URINALYSIS DIPSTICK
Bilirubin, UA: NEGATIVE
Blood, UA: NEGATIVE
Glucose, UA: POSITIVE — AB
Ketones, UA: NEGATIVE
Nitrite, UA: NEGATIVE
Protein, UA: NEGATIVE
Spec Grav, UA: >=1.03 — AB (ref 1.010–1.025)
Urobilinogen, UA: 0.2 U/dL
pH, UA: 6 (ref 5.0–8.0)

## 2023-03-21 NOTE — Patient Instructions (Signed)

## 2023-03-21 NOTE — Progress Notes (Signed)
I,Victoria T Deloria Lair, CMA,acting as a Neurosurgeon for Mariah Aliment, MD.,have documented all relevant documentation on the behalf of Mariah Aliment, MD,as directed by  Mariah Aliment, MD while in the presence of Mariah Aliment, MD.  Subjective:    Patient ID: Mariah Burns , female    DOB: 1946/04/06 , 77 y.o.   MRN: [id number]  Chief Complaint  Patient presents with   Annual Exam   Diabetes   Hypertension    HPI  The patient is here today for physical exam.   She reports compliance with meds. She denies headaches, chest pain and shortness of breath. She does not offer any new complaints.   Letter sent to Dr Harlon Flor for DM eye exam.   Diabetes She presents for her follow-up diabetic visit. She has type 2 diabetes mellitus. Her disease course has been stable. There are no hypoglycemic associated symptoms. Pertinent negatives for diabetes include no blurred vision. There are no hypoglycemic complications. Diabetic complications include nephropathy. Risk factors for coronary artery disease include diabetes mellitus, dyslipidemia, hypertension, sedentary lifestyle and post-menopausal. She is compliant with treatment most of the time.  Hypertension This is a chronic problem. The current episode started more than 1 year ago. The problem has been gradually improving since onset. The problem is controlled. Pertinent negatives include no blurred vision.    Past Medical History:  Diagnosis Date   Abdominal bloating 12/14/2022   Arthritis    Chronic bilateral low back pain without sciatica 10/28/2019   Chronic diastolic heart failure 06/06/2021   Class 1 obesity due to excess calories with serious comorbidity and body mass index (BMI) of 30.0 to 30.9 in adult 10/28/2019   Cyst of right breast 01/30/2017   1 cm     Dry mouth 11/19/2020   She is encouraged to increase fluid intake and to consider use of Biotene products. Also advised to increase her intake of healthy fats - walnuts,  avocado etc.      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   Mild neurocognitive disorder 02/23/2022   Obstructive sleep apnea 03/21/2007   NPSG 03/08/2010  AHI 19.8/hr            Osteopenia    Pericardial effusion 02/17/2015   Moderate circumferential effusion 2004 by echo     Personal history of colonic polyps    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 We discussed the use of Trintellix, she declines at this time.      Stage 2 chronic kidney disease 05/15/2018   Type 2 diabetes mellitus, without long-term current use of insulin 05/06/2010   Vitamin D deficiency 10/28/2019  Family History  Problem Relation Age of Onset   Allergies Sister    Heart disease Sister    Lupus Sister    Allergies Mother    Allergies Daughter    Healthy Daughter    Heart disease Sister    Gout Brother    Hypertension Brother    Heart disease Brother    Heart failure Father    Gout Brother    Hypertension Brother    Hypertension Brother    Hypertension Brother    Post-traumatic stress disorder Brother    Healthy Daughter      Current Outpatient  Medications:    amLODipine (NORVASC) 10 MG tablet, Take 1 tablet (10 mg total) by mouth daily., Disp: 90 tablet, Rfl: 2   ascorbic acid (VITAMIN C) 500 MG tablet, Take 500 mg by mouth daily., Disp: , Rfl:    aspirin EC 81 MG tablet, Take 81 mg by mouth daily. Swallow whole., Disp: , Rfl:    atorvastatin (LIPITOR) 40 MG tablet, TAKE ONE TABLET BY MOUTH DAILY EACH MONDAY-FRIDAY. SKIP WEEKENDS., Disp: 90 tablet, Rfl: 1   Cholecalciferol (VITAMIN D-3 PO), Take 5,000 Units by mouth daily., Disp: , Rfl:    empagliflozin (JARDIANCE) 10 MG TABS tablet, Take 1 tablet (10 mg total) by mouth daily before breakfast., Disp: 90 tablet, Rfl: 2   ezetimibe (ZETIA) 10 MG tablet, Take 1 tablet (10 mg total) by mouth daily., Disp: 30 tablet, Rfl: 10   Fluocinolone Acetonide Scalp 0.01 % OIL, Apply to affected area of scalp as directed 1-2 x/day as needed for itching, Disp: 118.28 mL, Rfl: 1   Menthol, Topical Analgesic, (BIOFREEZE ROLL-ON EX), Apply 1 application. topically as needed (pain in back,neck,thumb)., Disp: , Rfl:    nystatin (MYCOSTATIN/NYSTOP) powder, as needed (skin irritation)., Disp: , Rfl:    Polyvinyl Alcohol-Povidone PF 1.4-0.6 % SOLN, Place 1 drop into both eyes daily as needed (dry eyes). , Disp: , Rfl:    RESTASIS 0.05 % ophthalmic emulsion, as needed (dry eyes)., Disp: , Rfl:    TART CHERRY PO, daily at 6 (six) AM., Disp: , Rfl:    valsartan (DIOVAN) 80 MG tablet, Take 1 tablet (80 mg total) by mouth daily., Disp: 90 tablet, Rfl: 3   nitroGLYCERIN (NITROSTAT) 0.4 MG SL tablet, Place 1 tablet (0.4 mg total) under the tongue every 5 (five) minutes as needed for chest pain. (Patient not taking: Reported on 12/19/2022), Disp: 25 tablet, Rfl: 2  Current Facility-Administered Medications:    0.9 %  sodium chloride infusion, , Intravenous, PRN, Arnette Felts, FNP   Allergies  Allergen Reactions   Fosamax [Alendronate Sodium] Other (See Comments)    REACTION: "CAUSE GI UPSET AND FATIGUE"    Omeprazole Other (See Comments)    Lower ab pain   Motrin [Ibuprofen] Other (See Comments)    GI upset- has to eat prior to taking   Fluvastatin Other (See Comments)    mild memoryproblems of confusion       The patient states she uses post menopausal status for birth control. No LMP recorded. Patient has had a hysterectomy.. Negative for Dysmenorrhea. Negative for: breast discharge, breast lump(s), breast pain and breast self exam. Associated symptoms include abnormal vaginal bleeding. Pertinent negatives include abnormal bleeding (hematology), anxiety, decreased libido, depression, difficulty falling sleep, dyspareunia, history of infertility, nocturia, sexual dysfunction, sleep disturbances, urinary incontinence, urinary urgency, vaginal discharge and vaginal itching. Diet regular.The patient states her exercise level is  intermittent.  . The patient's tobacco  use is:  Social History   Tobacco Use  Smoking Status Never   Passive exposure: Never  Smokeless Tobacco Never  . She has been exposed to passive smoke. The patient's alcohol use is:  Social History   Substance and Sexual Activity  Alcohol Use No    Review of Systems  Constitutional: Negative.   HENT:  Positive for hearing loss.        She states her daughter states her hearing is not good. She often has to ask her to repeat what she says. Denies having any ear pain/tinnitus.   Eyes: Negative.  Negative for blurred vision.  Respiratory: Negative.    Cardiovascular: Negative.   Gastrointestinal: Negative.   Endocrine: Negative.   Genitourinary: Negative.   Musculoskeletal: Negative.   Skin: Negative.   Allergic/Immunologic: Negative.   Neurological: Negative.   Hematological: Negative.   Psychiatric/Behavioral: Negative.       Today's Vitals   03/21/23 1106  BP: 118/68  Pulse: 60  Temp: 98.3 F (36.8 C)  SpO2: 98%  Weight: 144 lb 6.4 oz (65.5 kg)  Height: 4\' 11"  (1.499 m)   Body mass index is 29.17 kg/m.   Wt Readings from Last 3 Encounters:  03/21/23 144 lb 6.4 oz (65.5 kg)  03/13/23 148 lb 9.6 oz (67.4 kg)  02/21/23 147 lb (66.7 kg)     Objective:  Physical Exam Vitals and nursing note reviewed.  Constitutional:      Appearance: Normal appearance.  HENT:     Head: Normocephalic and atraumatic.     Right Ear: Tympanic membrane, ear canal and external ear normal.     Left Ear: Tympanic membrane, ear canal and external ear normal.     Nose:     Comments: MASKED     Mouth/Throat:     Comments: MASKED  Eyes:     Extraocular Movements: Extraocular movements intact.     Conjunctiva/sclera: Conjunctivae normal.     Pupils: Pupils are equal, round, and reactive to light.  Cardiovascular:     Rate and Rhythm: Normal rate and regular rhythm.     Pulses:          Dorsalis pedis pulses are 1+ on the right side and 1+ on the left side.     Heart sounds: Normal heart sounds.  Pulmonary:     Effort: Pulmonary effort is normal.     Breath sounds: Normal breath sounds.  Chest:  Breasts:    Tanner Score is 5.     Right: Normal.     Left: Normal.     Comments: Healed surgical scar on right breast Abdominal:     General: Bowel sounds are normal.     Palpations: Abdomen is soft.     Comments: Rounded, soft  Genitourinary:    Comments: deferred Musculoskeletal:        General: Normal range of motion.     Cervical back: Normal range of motion and neck supple.  Feet:     Right foot:     Protective Sensation: 5 sites tested.  5 sites sensed.     Skin integrity: Dry skin present.     Toenail Condition: Right toenails are normal.     Left foot:     Protective Sensation: 5 sites tested.  5 sites sensed.     Skin integrity: Dry skin present.     Toenail Condition: Left toenails are normal.  Skin:    General: Skin is warm and dry.  Neurological:  General: No focal deficit present.     Mental Status: She is alert and oriented to person, place, and time.  Psychiatric:        Mood and  Affect: Mood normal.        Behavior: Behavior normal.         Assessment And Plan:     Encounter for general adult medical examination w/o abnormal findings Assessment & Plan: A full exam was performed.  Importance of monthly self breast exams was discussed with the patient.  We contacted her daughter at the end of the visit to address any concerns and to update her on treatment plan.  All questions were answered to her satisfaction.  She is advised to get 30-45 minutes of regular exercise, no less than four to five days per week. Both weight-bearing and aerobic exercises are recommended.  She is advised to follow a healthy diet with at least six fruits/veggies per day, decrease intake of red meat and other saturated fats and to increase fish intake to twice weekly.  Meats/fish should not be fried -- baked, boiled or broiled is preferable. It is also important to cut back on your sugar intake.  Be sure to read labels - try to avoid anything with added sugar, high fructose corn syrup or other sweeteners.  If you must use a sweetener, you can try stevia or monkfruit.  It is also important to avoid artificially sweetened foods/beverages and diet drinks. Lastly, wear SPF 50 sunscreen on exposed skin and when in direct sunlight for an extended period of time.  Be sure to avoid fast food restaurants and aim for at least 60 ounces of water daily.       Type 2 diabetes mellitus with stage 2 chronic kidney disease, without long-term current use of insulin (HCC) Assessment & Plan: Chronic, diabetic foot exam was performed.  She is currently on Jardiance 10mg  daily. She is encouraged to stay well hydrated while on this medication. She will f/u in four months for re-evaluation. I DISCUSSED WITH THE PATIENT AT LENGTH REGARDING THE GOALS OF GLYCEMIC CONTROL AND POSSIBLE LONG-TERM COMPLICATIONS.  I  ALSO STRESSED THE IMPORTANCE OF COMPLIANCE WITH HOME GLUCOSE MONITORING, DIETARY RESTRICTIONS INCLUDING AVOIDANCE OF  SUGARY DRINKS/PROCESSED FOODS,  ALONG WITH REGULAR EXERCISE.  I  ALSO STRESSED THE IMPORTANCE OF ANNUAL EYE EXAMS, SELF FOOT CARE AND COMPLIANCE WITH OFFICE VISITS.   Orders: -     CBC -     CMP14+EGFR -     Hemoglobin A1c -     POCT urinalysis dipstick -     Microalbumin / creatinine urine ratio -     EKG 12-Lead  Parenchymal renal hypertension, stage 1 through stage 4 or unspecified chronic kidney disease Assessment & Plan: Chronic, well controlled. EKG performed, SB w/ RBBB, nonspecific T abnormality.  She will continue amlodipine 10mg  daily and valsartan 80mg  daily.  She is encouraged to follow low sodium diet. She will f/u in four months for re-evaluation.   Orders: -     POCT urinalysis dipstick -     Microalbumin / creatinine urine ratio -     EKG 12-Lead  Memory loss Assessment & Plan: Chronic, she has been evaluated by Neurology. Their input is appreciated.  She is now scheduled for neuropsychiatric testing in January.    Hearing deficit, bilateral -     Ambulatory referral to Audiology  Overweight with body mass index (BMI) of 29 to 29.9 in adult Assessment & Plan: Her  BMI is acceptable for her demographic. She is encouraged to aim for at least 150 minutes of exercise per week.    Immunization due -     Flu Vaccine Trivalent High Dose (Fluad)     Return for 1 year HM, 4 month bp & dm. Patient was given opportunity to ask questions. Patient verbalized understanding of the plan and was able to repeat key elements of the plan. All questions were answered to their satisfaction.   Mariah Aliment, MD  I, Mariah Aliment, MD, have reviewed all documentation for this visit. The documentation on 03/31/23 for the exam, diagnosis, procedures, and orders are all accurate and complete.

## 2023-03-22 LAB — MICROALBUMIN / CREATININE URINE RATIO
Creatinine, Urine: 172.9 mg/dL
Microalb/Creat Ratio: 13 mg/g{creat} (ref 0–29)
Microalbumin, Urine: 23.2 ug/mL

## 2023-03-22 LAB — CMP14+EGFR
ALT: 28 [IU]/L (ref 0–32)
AST: 23 [IU]/L (ref 0–40)
Albumin: 4.6 g/dL (ref 3.8–4.8)
Alkaline Phosphatase: 103 [IU]/L (ref 44–121)
BUN/Creatinine Ratio: 17 (ref 12–28)
BUN: 16 mg/dL (ref 8–27)
Bilirubin Total: 0.5 mg/dL (ref 0.0–1.2)
CO2: 22 mmol/L (ref 20–29)
Calcium: 9.6 mg/dL (ref 8.7–10.3)
Chloride: 103 mmol/L (ref 96–106)
Creatinine, Ser: 0.94 mg/dL (ref 0.57–1.00)
Globulin, Total: 2.9 g/dL (ref 1.5–4.5)
Glucose: 83 mg/dL (ref 70–99)
Potassium: 4.2 mmol/L (ref 3.5–5.2)
Sodium: 141 mmol/L (ref 134–144)
Total Protein: 7.5 g/dL (ref 6.0–8.5)
eGFR: 62 mL/min/{1.73_m2} (ref >59)

## 2023-03-22 LAB — CBC
Hematocrit: 40.6 % (ref 34.0–46.6)
Hemoglobin: 12.7 g/dL (ref 11.1–15.9)
MCH: 26.7 pg (ref 26.6–33.0)
MCHC: 31.3 g/dL — ABNORMAL LOW (ref 31.5–35.7)
MCV: 86 fL (ref 79–97)
Platelets: 203 10*3/uL (ref 150–450)
RBC: 4.75 x10E6/uL (ref 3.77–5.28)
RDW: 13.9 % (ref 11.7–15.4)
WBC: 3.9 10*3/uL (ref 3.4–10.8)

## 2023-03-22 LAB — HEMOGLOBIN A1C
Est. average glucose Bld gHb Est-mCnc: 134 mg/dL
Hgb A1c MFr Bld: 6.3 % — ABNORMAL HIGH (ref 4.8–5.6)

## 2023-03-31 NOTE — Assessment & Plan Note (Signed)
A full exam was performed.  Importance of monthly self breast exams was discussed with the patient.  We contacted her daughter at the end of the visit to address any concerns and to update her on treatment plan.  All questions were answered to her satisfaction.  She is advised to get 30-45 minutes of regular exercise, no less than four to five days per week. Both weight-bearing and aerobic exercises are recommended.  She is advised to follow a healthy diet with at least six fruits/veggies per day, decrease intake of red meat and other saturated fats and to increase fish intake to twice weekly.  Meats/fish should not be fried -- baked, boiled or broiled is preferable. It is also important to cut back on your sugar intake.  Be sure to read labels - try to avoid anything with added sugar, high fructose corn syrup or other sweeteners.  If you must use a sweetener, you can try stevia or monkfruit.  It is also important to avoid artificially sweetened foods/beverages and diet drinks. Lastly, wear SPF 50 sunscreen on exposed skin and when in direct sunlight for an extended period of time.  Be sure to avoid fast food restaurants and aim for at least 60 ounces of water daily.

## 2023-03-31 NOTE — Assessment & Plan Note (Signed)
Chronic, she has been evaluated by Neurology. Their input is appreciated.  She is now scheduled for neuropsychiatric testing in January.

## 2023-03-31 NOTE — Assessment & Plan Note (Signed)
Chronic, diabetic foot exam was performed.  She is currently on Jardiance 10mg  daily. She is encouraged to stay well hydrated while on this medication. She will f/u in four months for re-evaluation. I DISCUSSED WITH THE PATIENT AT LENGTH REGARDING THE GOALS OF GLYCEMIC CONTROL AND POSSIBLE LONG-TERM COMPLICATIONS.  I  ALSO STRESSED THE IMPORTANCE OF COMPLIANCE WITH HOME GLUCOSE MONITORING, DIETARY RESTRICTIONS INCLUDING AVOIDANCE OF SUGARY DRINKS/PROCESSED FOODS,  ALONG WITH REGULAR EXERCISE.  I  ALSO STRESSED THE IMPORTANCE OF ANNUAL EYE EXAMS, SELF FOOT CARE AND COMPLIANCE WITH OFFICE VISITS.

## 2023-03-31 NOTE — Assessment & Plan Note (Addendum)
Chronic, well controlled. EKG performed, SB w/ RBBB, nonspecific T abnormality.  She will continue amlodipine 10mg  daily and valsartan 80mg  daily.  She is encouraged to follow low sodium diet. She will f/u in four months for re-evaluation.

## 2023-03-31 NOTE — Assessment & Plan Note (Signed)
Her BMI is acceptable for her demographic. She is encouraged to aim for at least 150 minutes of exercise per week.

## 2023-04-03 ENCOUNTER — Ambulatory Visit: Payer: Medicare PPO | Attending: Rheumatology

## 2023-04-03 ENCOUNTER — Other Ambulatory Visit: Payer: Self-pay

## 2023-04-03 DIAGNOSIS — G8929 Other chronic pain: Secondary | ICD-10-CM | POA: Insufficient documentation

## 2023-04-03 DIAGNOSIS — M7062 Trochanteric bursitis, left hip: Secondary | ICD-10-CM | POA: Diagnosis not present

## 2023-04-03 DIAGNOSIS — M25552 Pain in left hip: Secondary | ICD-10-CM | POA: Insufficient documentation

## 2023-04-03 DIAGNOSIS — M25511 Pain in right shoulder: Secondary | ICD-10-CM | POA: Insufficient documentation

## 2023-04-03 DIAGNOSIS — R2681 Unsteadiness on feet: Secondary | ICD-10-CM | POA: Diagnosis not present

## 2023-04-03 NOTE — Therapy (Signed)
OUTPATIENT PHYSICAL THERAPY THORACOLUMBAR EVALUATION   Patient Name: Mariah Burns MRN: [id number] DOB:17-Nov-1945, 77 y.o., female Today's Date: 04/03/2023  END OF SESSION:  PT End of Session - 04/03/23 1820     Visit Number 1    Number of Visits 1    PT Start Time 1545    PT Stop Time 1630    PT Time Calculation (min) 45 min    Activity Tolerance Patient tolerated treatment well    Behavior During Therapy WFL for tasks assessed/performed             Past Medical History:  Diagnosis Date   Abdominal bloating 12/14/2022   Arthritis    Chronic bilateral low back pain without sciatica 10/28/2019   Chronic diastolic heart failure 06/06/2021   Class 1 obesity due to excess calories with serious comorbidity and body mass index (BMI) of 30.0 to 30.9 in adult 10/28/2019   Cyst of right breast 01/30/2017   1 cm     Dry mouth 11/19/2020   She is encouraged to increase fluid intake and to consider use of Biotene products. Also advised to increase her intake of healthy fats - walnuts, avocado etc.      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   Mild neurocognitive disorder 02/23/2022   Obstructive sleep apnea 03/21/2007   NPSG 03/08/2010  AHI 19.8/hr            Osteopenia    Pericardial effusion 02/17/2015   Moderate circumferential effusion 2004 by echo     Personal history of colonic polyps    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 We discussed the use of Trintellix, she declines at this time.      Stage 2 chronic kidney disease 05/15/2018   Type 2 diabetes mellitus, without long-term current use of insulin 05/06/2010   Vitamin D deficiency 10/28/2019   Past Surgical History:  Procedure Laterality Date   BREAST LUMPECTOMY WITH RADIOACTIVE SEED AND SENTINEL LYMPH NODE BIOPSY Right 09/02/2018   Procedure: RIGHT BREAST LUMPECTOMY WITH RADIOACTIVE SEED AND  RIGHT SENTINEL LYMPH NODE BIOPSY;  Surgeon: Glenna Fellows, MD;  Location: [facility];  Service: General;  Laterality: Right;   BRONCHOSCOPY     dx'd sarcoid   KNEE ARTHROPLASTY Left 03/13/2017   NEUROPLASTY / TRANSPOSITION ULNAR NERVE AT ELBOW  09/2011   TOTAL ABDOMINAL HYSTERECTOMY     Patient Active Problem List   Diagnosis Date Noted   Encounter for general adult medical examination w/o abnormal findings 03/21/2023   Memory loss 01/01/2023   Postnasal drip 01/01/2023   Overweight with body mass index (BMI) of 29 to 29.9 in adult  OUTPATIENT PHYSICAL THERAPY THORACOLUMBAR EVALUATION   Patient Name: Mariah Burns MRN: [id number] DOB:17-Nov-1945, 77 y.o., female Today's Date: 04/03/2023  END OF SESSION:  PT End of Session - 04/03/23 1820     Visit Number 1    Number of Visits 1    PT Start Time 1545    PT Stop Time 1630    PT Time Calculation (min) 45 min    Activity Tolerance Patient tolerated treatment well    Behavior During Therapy WFL for tasks assessed/performed             Past Medical History:  Diagnosis Date   Abdominal bloating 12/14/2022   Arthritis    Chronic bilateral low back pain without sciatica 10/28/2019   Chronic diastolic heart failure 06/06/2021   Class 1 obesity due to excess calories with serious comorbidity and body mass index (BMI) of 30.0 to 30.9 in adult 10/28/2019   Cyst of right breast 01/30/2017   1 cm     Dry mouth 11/19/2020   She is encouraged to increase fluid intake and to consider use of Biotene products. Also advised to increase her intake of healthy fats - walnuts, avocado etc.      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   Mild neurocognitive disorder 02/23/2022   Obstructive sleep apnea 03/21/2007   NPSG 03/08/2010  AHI 19.8/hr            Osteopenia    Pericardial effusion 02/17/2015   Moderate circumferential effusion 2004 by echo     Personal history of colonic polyps    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 We discussed the use of Trintellix, she declines at this time.      Stage 2 chronic kidney disease 05/15/2018   Type 2 diabetes mellitus, without long-term current use of insulin 05/06/2010   Vitamin D deficiency 10/28/2019   Past Surgical History:  Procedure Laterality Date   BREAST LUMPECTOMY WITH RADIOACTIVE SEED AND SENTINEL LYMPH NODE BIOPSY Right 09/02/2018   Procedure: RIGHT BREAST LUMPECTOMY WITH RADIOACTIVE SEED AND  RIGHT SENTINEL LYMPH NODE BIOPSY;  Surgeon: Glenna Fellows, MD;  Location: [facility];  Service: General;  Laterality: Right;   BRONCHOSCOPY     dx'd sarcoid   KNEE ARTHROPLASTY Left 03/13/2017   NEUROPLASTY / TRANSPOSITION ULNAR NERVE AT ELBOW  09/2011   TOTAL ABDOMINAL HYSTERECTOMY     Patient Active Problem List   Diagnosis Date Noted   Encounter for general adult medical examination w/o abnormal findings 03/21/2023   Memory loss 01/01/2023   Postnasal drip 01/01/2023   Overweight with body mass index (BMI) of 29 to 29.9 in adult  Baseline: Goal status: INITIAL  2.  *** Baseline:  Goal status: INITIAL  3.  *** Baseline:  Goal status: INITIAL  4.  *** Baseline:  Goal status: INITIAL  5.  *** Baseline:  Goal status: INITIAL  6.  *** Baseline:  Goal status: INITIAL  LONG TERM GOALS: Target date: ***  *** Baseline:  Goal status: INITIAL  2.  *** Baseline:  Goal status: INITIAL  3.  *** Baseline:  Goal status: INITIAL  4.  *** Baseline:  Goal status: INITIAL  5.  *** Baseline:  Goal status: INITIAL  6.  *** Baseline:  Goal status: INITIAL  PLAN:  PT FREQUENCY: {rehab frequency:25116}  PT DURATION: {rehab duration:25117}  PLANNED  INTERVENTIONS: {rehab planned interventions:25118::"97110-Therapeutic exercises","97530- Therapeutic [id number]- Neuromuscular re-education","97535- Self FAOZ","30865- Manual therapy"}.  PLAN FOR NEXT SESSION: ***   Joellyn Rued, PT 04/03/2023, 6:25 PM    OUTPATIENT PHYSICAL THERAPY SHOULDER EVALUATION   Patient Name: MONEKE LINCICOME MRN: [id number] DOB:14-Oct-1945, 77 y.o., female Today's Date: 04/03/2023  END OF SESSION:  PT End of Session - 04/03/23 1820     Visit Number 1    Number of Visits 1    PT Start Time 1545    PT Stop Time 1630    PT Time Calculation (min) 45 min    Activity Tolerance Patient tolerated treatment well    Behavior During Therapy WFL for tasks assessed/performed             Past Medical History:  Diagnosis Date   Abdominal bloating 12/14/2022   Arthritis    Chronic bilateral low back pain without sciatica 10/28/2019   Chronic diastolic heart failure 06/06/2021   Class 1 obesity due to excess calories with serious comorbidity and body mass index (BMI) of 30.0 to 30.9 in adult 10/28/2019   Cyst of right breast 01/30/2017   1 cm     Dry mouth 11/19/2020   She is encouraged to increase fluid intake and to consider use of Biotene products. Also advised to increase her intake of healthy fats - walnuts, avocado etc.      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 We discussed the use of Trintellix, she declines at this time.      Stage 2 chronic kidney disease 05/15/2018  Baseline: Goal status: INITIAL  2.  *** Baseline:  Goal status: INITIAL  3.  *** Baseline:  Goal status: INITIAL  4.  *** Baseline:  Goal status: INITIAL  5.  *** Baseline:  Goal status: INITIAL  6.  *** Baseline:  Goal status: INITIAL  LONG TERM GOALS: Target date: ***  *** Baseline:  Goal status: INITIAL  2.  *** Baseline:  Goal status: INITIAL  3.  *** Baseline:  Goal status: INITIAL  4.  *** Baseline:  Goal status: INITIAL  5.  *** Baseline:  Goal status: INITIAL  6.  *** Baseline:  Goal status: INITIAL  PLAN:  PT FREQUENCY: {rehab frequency:25116}  PT DURATION: {rehab duration:25117}  PLANNED  INTERVENTIONS: {rehab planned interventions:25118::"97110-Therapeutic exercises","97530- Therapeutic [id number]- Neuromuscular re-education","97535- Self FAOZ","30865- Manual therapy"}.  PLAN FOR NEXT SESSION: ***   Joellyn Rued, PT 04/03/2023, 6:25 PM    OUTPATIENT PHYSICAL THERAPY SHOULDER EVALUATION   Patient Name: MONEKE LINCICOME MRN: [id number] DOB:14-Oct-1945, 77 y.o., female Today's Date: 04/03/2023  END OF SESSION:  PT End of Session - 04/03/23 1820     Visit Number 1    Number of Visits 1    PT Start Time 1545    PT Stop Time 1630    PT Time Calculation (min) 45 min    Activity Tolerance Patient tolerated treatment well    Behavior During Therapy WFL for tasks assessed/performed             Past Medical History:  Diagnosis Date   Abdominal bloating 12/14/2022   Arthritis    Chronic bilateral low back pain without sciatica 10/28/2019   Chronic diastolic heart failure 06/06/2021   Class 1 obesity due to excess calories with serious comorbidity and body mass index (BMI) of 30.0 to 30.9 in adult 10/28/2019   Cyst of right breast 01/30/2017   1 cm     Dry mouth 11/19/2020   She is encouraged to increase fluid intake and to consider use of Biotene products. Also advised to increase her intake of healthy fats - walnuts, avocado etc.      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 We discussed the use of Trintellix, she declines at this time.      Stage 2 chronic kidney disease 05/15/2018  OUTPATIENT PHYSICAL THERAPY THORACOLUMBAR EVALUATION   Patient Name: Mariah Burns MRN: [id number] DOB:17-Nov-1945, 77 y.o., female Today's Date: 04/03/2023  END OF SESSION:  PT End of Session - 04/03/23 1820     Visit Number 1    Number of Visits 1    PT Start Time 1545    PT Stop Time 1630    PT Time Calculation (min) 45 min    Activity Tolerance Patient tolerated treatment well    Behavior During Therapy WFL for tasks assessed/performed             Past Medical History:  Diagnosis Date   Abdominal bloating 12/14/2022   Arthritis    Chronic bilateral low back pain without sciatica 10/28/2019   Chronic diastolic heart failure 06/06/2021   Class 1 obesity due to excess calories with serious comorbidity and body mass index (BMI) of 30.0 to 30.9 in adult 10/28/2019   Cyst of right breast 01/30/2017   1 cm     Dry mouth 11/19/2020   She is encouraged to increase fluid intake and to consider use of Biotene products. Also advised to increase her intake of healthy fats - walnuts, avocado etc.      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   Mild neurocognitive disorder 02/23/2022   Obstructive sleep apnea 03/21/2007   NPSG 03/08/2010  AHI 19.8/hr            Osteopenia    Pericardial effusion 02/17/2015   Moderate circumferential effusion 2004 by echo     Personal history of colonic polyps    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 We discussed the use of Trintellix, she declines at this time.      Stage 2 chronic kidney disease 05/15/2018   Type 2 diabetes mellitus, without long-term current use of insulin 05/06/2010   Vitamin D deficiency 10/28/2019   Past Surgical History:  Procedure Laterality Date   BREAST LUMPECTOMY WITH RADIOACTIVE SEED AND SENTINEL LYMPH NODE BIOPSY Right 09/02/2018   Procedure: RIGHT BREAST LUMPECTOMY WITH RADIOACTIVE SEED AND  RIGHT SENTINEL LYMPH NODE BIOPSY;  Surgeon: Glenna Fellows, MD;  Location: [facility];  Service: General;  Laterality: Right;   BRONCHOSCOPY     dx'd sarcoid   KNEE ARTHROPLASTY Left 03/13/2017   NEUROPLASTY / TRANSPOSITION ULNAR NERVE AT ELBOW  09/2011   TOTAL ABDOMINAL HYSTERECTOMY     Patient Active Problem List   Diagnosis Date Noted   Encounter for general adult medical examination w/o abnormal findings 03/21/2023   Memory loss 01/01/2023   Postnasal drip 01/01/2023   Overweight with body mass index (BMI) of 29 to 29.9 in adult  Baseline: Goal status: INITIAL  2.  *** Baseline:  Goal status: INITIAL  3.  *** Baseline:  Goal status: INITIAL  4.  *** Baseline:  Goal status: INITIAL  5.  *** Baseline:  Goal status: INITIAL  6.  *** Baseline:  Goal status: INITIAL  LONG TERM GOALS: Target date: ***  *** Baseline:  Goal status: INITIAL  2.  *** Baseline:  Goal status: INITIAL  3.  *** Baseline:  Goal status: INITIAL  4.  *** Baseline:  Goal status: INITIAL  5.  *** Baseline:  Goal status: INITIAL  6.  *** Baseline:  Goal status: INITIAL  PLAN:  PT FREQUENCY: {rehab frequency:25116}  PT DURATION: {rehab duration:25117}  PLANNED  INTERVENTIONS: {rehab planned interventions:25118::"97110-Therapeutic exercises","97530- Therapeutic [id number] [id number]- Neuromuscular re-education","97535- Self FAOZ","30865- Manual therapy"}.  PLAN FOR NEXT SESSION: ***   Joellyn Rued, PT 04/03/2023, 6:25 PM    OUTPATIENT PHYSICAL THERAPY SHOULDER EVALUATION   Patient Name: MONEKE LINCICOME MRN: [id number] DOB:14-Oct-1945, 77 y.o., female Today's Date: 04/03/2023  END OF SESSION:  PT End of Session - 04/03/23 1820     Visit Number 1    Number of Visits 1    PT Start Time 1545    PT Stop Time 1630    PT Time Calculation (min) 45 min    Activity Tolerance Patient tolerated treatment well    Behavior During Therapy WFL for tasks assessed/performed             Past Medical History:  Diagnosis Date   Abdominal bloating 12/14/2022   Arthritis    Chronic bilateral low back pain without sciatica 10/28/2019   Chronic diastolic heart failure 06/06/2021   Class 1 obesity due to excess calories with serious comorbidity and body mass index (BMI) of 30.0 to 30.9 in adult 10/28/2019   Cyst of right breast 01/30/2017   1 cm     Dry mouth 11/19/2020   She is encouraged to increase fluid intake and to consider use of Biotene products. Also advised to increase her intake of healthy fats - walnuts, avocado etc.      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 We discussed the use of Trintellix, she declines at this time.      Stage 2 chronic kidney disease 05/15/2018  Baseline: Goal status: INITIAL  2.  *** Baseline:  Goal status: INITIAL  3.  *** Baseline:  Goal status: INITIAL  4.  *** Baseline:  Goal status: INITIAL  5.  *** Baseline:  Goal status: INITIAL  6.  *** Baseline:  Goal status: INITIAL  LONG TERM GOALS: Target date: ***  *** Baseline:  Goal status: INITIAL  2.  *** Baseline:  Goal status: INITIAL  3.  *** Baseline:  Goal status: INITIAL  4.  *** Baseline:  Goal status: INITIAL  5.  *** Baseline:  Goal status: INITIAL  6.  *** Baseline:  Goal status: INITIAL  PLAN:  PT FREQUENCY: {rehab frequency:25116}  PT DURATION: {rehab duration:25117}  PLANNED  INTERVENTIONS: {rehab planned interventions:25118::"97110-Therapeutic exercises","97530- Therapeutic [id number] [id number]- Neuromuscular re-education","97535- Self FAOZ","30865- Manual therapy"}.  PLAN FOR NEXT SESSION: ***   Joellyn Rued, PT 04/03/2023, 6:25 PM    OUTPATIENT PHYSICAL THERAPY SHOULDER EVALUATION   Patient Name: MONEKE LINCICOME MRN: [id number] DOB:14-Oct-1945, 77 y.o., female Today's Date: 04/03/2023  END OF SESSION:  PT End of Session - 04/03/23 1820     Visit Number 1    Number of Visits 1    PT Start Time 1545    PT Stop Time 1630    PT Time Calculation (min) 45 min    Activity Tolerance Patient tolerated treatment well    Behavior During Therapy WFL for tasks assessed/performed             Past Medical History:  Diagnosis Date   Abdominal bloating 12/14/2022   Arthritis    Chronic bilateral low back pain without sciatica 10/28/2019   Chronic diastolic heart failure 06/06/2021   Class 1 obesity due to excess calories with serious comorbidity and body mass index (BMI) of 30.0 to 30.9 in adult 10/28/2019   Cyst of right breast 01/30/2017   1 cm     Dry mouth 11/19/2020   She is encouraged to increase fluid intake and to consider use of Biotene products. Also advised to increase her intake of healthy fats - walnuts, avocado etc.      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 We discussed the use of Trintellix, she declines at this time.      Stage 2 chronic kidney disease 05/15/2018

## 2023-04-25 ENCOUNTER — Ambulatory Visit: Payer: Medicare PPO | Attending: Rheumatology | Admitting: Audiologist

## 2023-04-25 DIAGNOSIS — H903 Sensorineural hearing loss, bilateral: Secondary | ICD-10-CM | POA: Insufficient documentation

## 2023-04-25 NOTE — Procedures (Signed)
  Outpatient Audiology and [facility] [address] [place], [place]  [address] [phone]  AUDIOLOGICAL  EVALUATION  NAME: [name]     DOB:   Apr 07, 1946      MRN: [id number]                                                                                     DATE: 04/25/2023     REFERENT: Dorothyann Peng, MD STATUS: Outpatient DIAGNOSIS: Sensorineural Hearing Loss Bilateral    History: Mariah Burns was seen for an audiological evaluation due to concern that she is not hearing well. She sings soprano and feels she is off. Her daughter has been telling [name] she is not hearing well. [name] denies any pain pressure or tinnitus in either ear. She has no history of hazardous occupational noise exposure. She has diabetes. No other case history reported.   Evaluation:  Otoscopy showed a clear view of the tympanic membranes, bilaterally Tympanometry results were consistent with normal middle ear function, bilaterally   Audiometric testing was completed using Conventional Audiometry techniques with insert earphones and TDH headphones. Test results are consistent with normal hearing sloping after 2kHz to mild loss, dropping to moderate loss at [facility] only bilaterally. Speech Recognition Thresholds were obtained at 25dB HL in the right ear and at 25dB HL in the left ear. Word Recognition Testing was completed at 40dB SL and Earlee scored 100% for each ear.    Results:  The test results were reviewed with [facility]. She has a mild high pitched sensorineural hearing loss. She is not a hearing aid candidate. She just needs healthy communicate, face to face within five feet. She should monitor her hearing loss for progression.   Recommendations: 1.   Monitor hearing loss annually due to diabetes. Hearing aids not needed at this time due to mild degree of loss.    25 minutes spent testing and counseling on results.   If you have any questions please feel free to contact me at [phone]  [phone].  Ammie Ferrier Au.D.  Audiologist   04/25/2023  3:12 PM  Cc: Dorothyann Peng, MD

## 2023-04-30 ENCOUNTER — Other Ambulatory Visit: Payer: Self-pay | Admitting: Internal Medicine

## 2023-04-30 DIAGNOSIS — I10 Essential (primary) hypertension: Secondary | ICD-10-CM

## 2023-05-06 ENCOUNTER — Other Ambulatory Visit: Payer: Self-pay

## 2023-05-06 DIAGNOSIS — I10 Essential (primary) hypertension: Secondary | ICD-10-CM

## 2023-05-06 MED ORDER — AMLODIPINE BESYLATE 10 MG PO TABS: 10.0000 mg | ORAL_TABLET | Freq: Every day | ORAL | 2 refills | Status: DC

## 2023-05-06 MED ORDER — VALSARTAN 80 MG PO TABS: 80.0000 mg | ORAL_TABLET | Freq: Every day | ORAL | 2 refills | Status: DC

## 2023-05-15 NOTE — Progress Notes (Unsigned)
Subjective:    Patient ID: Mariah Burns, female    DOB: [birth year], 77 y.o.   MRN: [id number]  HPI female never smoker followed for OSA, Sarcoid, complicated by HBP, DM NPSG 03/08/2010  AHI 19.8/hr  Office spirometry 09/20/15- WNL-FEV1/FVC 0.85 HST 12/24/19- AHI 12.1/ hr, desaturation to 88%, bodyweight 150 lbs ------------------------------------------------------------------------------------------   05/10/22-  77 year old female never smoker followed for OSA, Sarcoid, complicated by HBP, dCHF, DM2, Cancer R breast XRT, CPAP auto 4-10/ [employer] [facility] Download-compliance 93%, AHI 2.8/ hr Body weight today- Covid vax- 3 Phizer Flu vax-had Download reviewed. Eyes get dry- discussed mask fit and humidifier. May be partly medication effect. No rash or cough. Sarcoid thought to be burned out. CXR 07/07/21- FINDINGS: Heart size and pulmonary vascularity are normal. Lungs are clear. No pleural effusions. No pneumothorax. 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 This is unchanged since prior study. Degenerative changes in the spine. IMPRESSION: No evidence of active pulmonary disease. No change since previous study.  05/16/23-77 year old female never smoker followed for OSA, Sarcoid, complicated by HBP, dCHF, DM2, Cancer R breast XRT, CPAP auto 4-10/ [employer] [facility] Download-compliance 100%, AHI 2/hr Body weight today-144 lbs Followed by Rheumatology for arthrit CXR 07/24/22- IMPRESSION: 1. No acute cardiopulmonary process. No significant change from prior. 2. Chronic bilateral interstitial thickening, presumably secondary to the patient's history of pulmonary fibrosis and interstitial scarring.  Discussed the use of AI scribe software for clinical note transcription with the patient, who gave verbal consent to proceed.  History of Present Illness   The patient, with a history of sarcoidosis, breast  cancer, and heart failure, presents with discomfort from her CPAP machine. She reports that the mask is usually the issue and that it covers both her nose and mouth. She has been receiving supplies, including extra masks, from a new home care company, [employer], after her previous company, [employer], stopped providing CPAP services. The patient's CPAP machine may be around 78 years old, but she is unsure. She also mentions occasional coughing and phlegm, which she attributes to moving into a new house.  The patient's history of sarcoidosis is thought to be resolved, and there has been no recurrence of her breast cancer. She had an episode related to her heart failure, but hospital evaluation found no issues. She is also followed by rheumatology for arthritis.       Review of Systems-see HPI  + = positive Constitutional:   No-   weight loss, night sweats, fevers, chills, +fatigue, lassitude. HEENT:   No-  headaches, difficulty swallowing, tooth/dental problems, sore throat,       No-  sneezing, itching, ear ache, nasal congestion, post nasal drip,  CV:  No-   chest pain, orthopnea, PND, swelling in lower extremities, anasarca, dizziness, palpitations Resp: +shortness of breath with exertion or at rest.              No-   productive cough,  No non-productive cough,  No- coughing up of blood.              No-   change in color of mucus.  No- wheezing.   Skin: No-   rash or lesions. GI:  No-   heartburn, indigestion, abdominal pain, nausea, vomiting,  GU:  MS:  No-   joint pain or swelling.   Neuro-     nothing unusual Psych:  No- change in mood or affect. No depression or  anxiety.  No memory loss.  Objective:   Physical Exam    General- Alert, Oriented, Affect-appropriate, Distress- none acute; +medium build Skin- rash-none, lesions- none, excoriation- none Lymphadenopathy- none Head- atraumatic            Eyes- Gross vision intact, PERRLA, conjunctivae clear secretions            Ears-  Hearing, canals-normal            Nose- Clear, no-Septal dev, mucus, polyps, erosion, perforation             Throat- Mallampati III , mucosa- not very dry , drainage- none, tonsils- atrophic Neck- flexible , trachea midline, no stridor , thyroid nl, carotid no bruit Chest - symmetrical excursion , unlabored           Heart/CV- RRR ,  Murmur+1-2/6 systolic AS , no gallop  , no rub, nl s1 s2                           - JVD- none , edema- none, stasis changes- none, varices- none           Lung- clear to P&A, wheeze- none, cough- none , dullness-none, rub- none           Chest wall-  Abd-  Br/ Gen/ Rectal- Not done, not indicated Extrem- cyanosis- none, clubbing, none, atrophy- none, strength- nl Neuro- grossly intact to observation  Assessment and Plan    Obstructive Sleep Apnea Patient reports discomfort with current CPAP mask and inconsistent use. No issues reported with the machine itself. -Request [facility] to refit patient's CPAP mask. -Consider replacement of CPAP machine if it is over 19 years old.  Sarcoidosis History of sarcoidosis with residual lung scarring. Patient reports occasional cough and phlegm, but no wheezing or rattling noted on examination. -Order annual chest x-ray to monitor for any changes.  Breast Cancer No recurrence reported. -Continue current follow-up plan.  Heart Failure No recent issues reported. Recent hospital visit did not reveal any cardiac issues. -Continue current management plan.  Arthritis Managed by Rheumatology. -Continue current management plan.

## 2023-05-16 ENCOUNTER — Encounter: Payer: Self-pay | Admitting: Internal Medicine

## 2023-05-16 ENCOUNTER — Ambulatory Visit: Payer: Medicare PPO

## 2023-05-16 ENCOUNTER — Ambulatory Visit: Payer: Medicare PPO | Admitting: Internal Medicine

## 2023-05-16 VITALS — BP 130/74 | HR 61 | Ht 59.0 in | Wt 144.0 lb

## 2023-05-16 DIAGNOSIS — J42 Unspecified chronic bronchitis: Secondary | ICD-10-CM

## 2023-05-16 DIAGNOSIS — R9389 Abnormal findings on diagnostic imaging of other specified body structures: Secondary | ICD-10-CM | POA: Diagnosis not present

## 2023-05-16 DIAGNOSIS — G4733 Obstructive sleep apnea (adult) (pediatric): Secondary | ICD-10-CM

## 2023-05-16 DIAGNOSIS — D869 Sarcoidosis, unspecified: Secondary | ICD-10-CM

## 2023-05-16 NOTE — Patient Instructions (Signed)
Order- DME [facility] please refit CPAP mask of choice for comfort.  Continue autopap 4-10  Order- CXR    chronic bronchitis, hx sarcoid

## 2023-06-07 DIAGNOSIS — G4733 Obstructive sleep apnea (adult) (pediatric): Secondary | ICD-10-CM | POA: Diagnosis not present

## 2023-06-10 ENCOUNTER — Encounter: Payer: Self-pay | Admitting: Psychology

## 2023-06-11 ENCOUNTER — Ambulatory Visit: Payer: Medicare PPO | Admitting: Psychology

## 2023-06-11 ENCOUNTER — Institutional Professional Consult (permissible substitution): Payer: Self-pay | Admitting: Psychology

## 2023-06-11 ENCOUNTER — Encounter: Payer: Self-pay | Admitting: Psychology

## 2023-06-11 ENCOUNTER — Ambulatory Visit: Payer: Medicare PPO

## 2023-06-11 DIAGNOSIS — F02A0 Dementia in other diseases classified elsewhere, mild, without behavioral disturbance, psychotic disturbance, mood disturbance, and anxiety: Secondary | ICD-10-CM | POA: Diagnosis not present

## 2023-06-11 DIAGNOSIS — F03A0 Unspecified dementia, mild, without behavioral disturbance, psychotic disturbance, mood disturbance, and anxiety: Secondary | ICD-10-CM | POA: Insufficient documentation

## 2023-06-11 DIAGNOSIS — G309 Alzheimer's disease, unspecified: Secondary | ICD-10-CM

## 2023-06-11 DIAGNOSIS — R4189 Other symptoms and signs involving cognitive functions and awareness: Secondary | ICD-10-CM

## 2023-06-11 HISTORY — DX: Unspecified dementia, mild, without behavioral disturbance, psychotic disturbance, mood disturbance, and anxiety: F03.A0

## 2023-06-11 NOTE — Progress Notes (Signed)
   Psychometrician Note   Cognitive testing was administered to Mariah Burns by Lonell Jude, B.S. (psychometrist) under the supervision of Dr. Arthea KYM Maryland, Ph.D., licensed psychologist on 06/11/2023. [name] did not appear overtly distressed by the testing session per behavioral observation or responses across self-report questionnaires. Rest breaks were offered.    The battery of tests administered was selected by Dr. Zachary C. Merz, Ph.D. with consideration to [name]'s current level of functioning, the nature of her symptoms, emotional and behavioral responses during interview, level of literacy, observed level of motivation/effort, and the nature of the referral question. This battery was communicated to the psychometrist. Communication between Dr. Arthea KYM Maryland, Ph.D. and the psychometrist was ongoing throughout the evaluation and Dr. Arthea KYM Maryland, Ph.D. was immediately accessible at all times. Dr. Arthea KYM Maryland, Ph.D. provided supervision to the psychometrist on the date of this service to the extent necessary to assure the quality of all services provided.    Mariah Burns will return within approximately 1-2 weeks for an interactive feedback session with Dr. Maryland at which time her test performances, clinical impressions, and treatment recommendations will be reviewed in detail. [name] understands she can contact our office should she require our assistance before this time.  A total of 115 minutes of billable time were spent face-to-face with [name] by the psychometrist. This includes both test administration and scoring time. Billing for these services is reflected in the clinical report generated by Dr. Arthea KYM Maryland, Ph.D.  This note reflects time spent with the psychometrician and does not include test scores or any clinical interpretations made by Dr. Maryland. The full report will follow in a separate note.

## 2023-06-11 NOTE — Progress Notes (Signed)
 NEUROPSYCHOLOGICAL EVALUATION [place]. [facility] [place] Department of Neurology  Date of Evaluation: June 11, 2023  Reason for Referral:   Mariah Burns is a 78 y.o. right-handed African-American female referred by Camie Sevin, PA-C, to characterize her current cognitive functioning and assist with diagnostic clarity and treatment planning in the context of subjective cognitive decline.   Assessment and Plan:   Clinical Impression(s): Mariah Burns pattern of performance is suggestive of severe impairment surrounding all aspects of learning and memory. An additional impairment was exhibited across executive functioning, while performance variability was exhibited across complex attention, semantic fluency, and visuospatial abilities. Performances were appropriate relative to age-matched peers across processing speed, basic attention, safety/judgment, receptive language, phonemic fluency, and confrontation naming. Functionally, Mariah Burns's daughters have fully taken over all medication management, financial management, and bill paying responsibilities. She also no longer drives at the recommendation of her medical team. Given evidence for both cognitive and functional impairment, Mariah Burns best meets diagnostic criteria for a Major Neurocognitive Disorder (dementia) at the present time.  The cause for her mild dementia presentation is somewhat unclear. With that being said, I do have concerns for an underlying neurodegenerative illness, namely Alzheimer's disease. Across memory tasks, Mariah Burns did not benefit from repeated exposure to novel information, was fully amnestic (i.e., 0% retention) across 2/3 memory tasks after a brief delay, and performed poorly across all yes/no recognition trials. Taken together, this suggests evidence for rapid forgetting and a pronounced storage impairment, both of which are the hallmark testing patterns of this illness. Further  weakness/variability surrounding executive functioning, semantic fluency, and visuospatial abilities align reasonably well with expected progression. Intact confrontation naming is encouraging. Continued medical monitoring will be important moving forward.  Mariah Burns denied behavioral characteristics of Lewy body disease, another more rare parkinsonian condition, and frontotemporal lobar degeneration. Testing patterns also do not align with these presentations particularly well, making them unlikely. Prior neuroimaging also revealed only mild microvascular ischemic disease, making a vascular dementia presentation unlikely as well. While mild psychiatric distress may exacerbate cognitive dysfunction, it would not account for the extent of ongoing memory impairment.   Recommendations: Mariah Burns should discuss medication aimed to address memory loss and concerns surrounding Alzheimer's disease with Mariah Burns. It is important to highlight that this medication has been shown to slow functional decline in some individuals. There is no current treatment which can stop or reverse cognitive decline when caused by a neurodegenerative illness.   Mild psychiatric distress could be addressed with mood-related medications. As such, Mariah Burns is encouraged to speak with her prescribing physician regarding medication adjustments to optimally manage these symptoms.   It will be important for Mariah Burns to have another person with her when in situations where she may need to process information, weigh the pros and cons of different options, and make decisions, in order to ensure that she fully understands and recalls all information to be considered. She will likely benefit from the establishment and maintenance of a routine in order to maximize her functional abilities over time.  If not already done, Mariah Burns and her family may want to discuss her wishes regarding durable power of attorney and medical  decision making, so that she can have input into these choices. If they require legal assistance with this, long-term care resource access, or other aspects of estate planning, they could reach out to [employer] at [phone] for a free consultation. Additionally, they may wish to discuss future  plans for caretaking and seek out community options for in home/residential care should they become necessary.  Mariah Burns is encouraged to attend to lifestyle factors for brain health (e.g., regular physical exercise, good nutrition habits and consideration of the MIND-DASH diet, regular participation in cognitively-stimulating activities, and general stress management techniques), which are likely to have benefits for both emotional adjustment and cognition. In fact, in addition to promoting good general health, regular exercise incorporating aerobic activities (e.g., brisk walking, jogging, cycling, etc.) has been demonstrated to be a very effective treatment for depression and stress, with similar efficacy rates to both antidepressant medication and psychotherapy. Optimal control of vascular risk factors (including safe cardiovascular exercise and adherence to dietary recommendations) is encouraged. Likewise, continued compliance with her CPAP machine will also be important. Continued participation in activities which provide mental stimulation and social interaction is also recommended.   Important information should be provided to Mariah Burns in written format in all instances. This information should be placed in a highly frequented and easily visible location within her home to promote recall. External strategies such as written notes in a consistently used memory journal, visual and nonverbal auditory cues such as a calendar on the refrigerator or appointments with alarm, such as on a cell phone, can also help maximize recall.  To address problems with fluctuating attention and/or executive  dysfunction, she may wish to consider:   -Avoiding external distractions when needing to concentrate   -Limiting exposure to fast paced environments with multiple sensory demands   -Writing down complicated information and using checklists   -Attempting and completing one task at a time (i.e., no multi-tasking)   -Verbalizing aloud each step of a task to maintain focus   -Taking frequent breaks during the completion of steps/tasks to avoid fatigue   -Reducing the amount of information considered at one time   -Scheduling more difficult activities for a time of day where she is usually most alert  Review of Records:   Ms. Croy was seen by [facility] Jayson Sevin, PA-C) on 10/24/2022 for an evaluation of memory loss. At that time, difficulties were said to be present for the past 1.5 years per her daughter's report. Examples included trouble recalling names and details of recent conversations. There were also some instances of Ms. Sebastian misplacing (e.g., leaving her phone in the refrigerator) or losing (e.g., losing her glasses and needing to purchase new ones) things. Functionally, he daughters provide assistance with medication and financial management. She has been advised to not drive by her cardiologist due to dizziness concerns. Performance on a brief cognitive screening instrument (MOCA) was 20/30. Ultimately, Ms. Harvie was referred for a comprehensive neuropsychological evaluation to characterize her cognitive abilities and to assist with diagnostic clarity and treatment planning.   Neuroimaging: Brain MRI on 03/21/2021 revealed mild generalized cerebral and cerebellar atrophy, as well as mild microvascular ischemic disease. Brain MRI on 11/14/2022 was stable.   Past Medical History:  Diagnosis Date   Abdominal bloating 12/14/2022   Arthritis    Chronic bilateral low back pain without sciatica 10/28/2019   Chronic diastolic heart failure 06/06/2021   Class 1 obesity due  to excess calories with serious comorbidity and body mass index (BMI) of 30.0 to 30.9 in adult 10/28/2019   Cyst of right breast 01/30/2017   1 cm     Dry mouth 11/19/2020   She is encouraged to increase fluid intake and to consider use of Biotene products. Also advised to increase her intake  of healthy fats - walnuts, avocado etc.      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 Daughter verbally acknowledges  treatment plan.     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 We discussed the use of Trintellix, she declines at this time.      Stage 2  chronic kidney disease 05/15/2018   Type 2 diabetes mellitus, without long-term current use of insulin 05/06/2010   Vitamin D  deficiency 10/28/2019    Past Surgical History:  Procedure Laterality Date   BREAST LUMPECTOMY WITH RADIOACTIVE SEED AND SENTINEL LYMPH NODE BIOPSY Right 09/02/2018   Procedure: RIGHT BREAST LUMPECTOMY WITH RADIOACTIVE SEED AND  RIGHT SENTINEL LYMPH NODE BIOPSY;  Surgeon: Mikell Katz, MD;  Location: [facility];  Service: General;  Laterality: Right;   BRONCHOSCOPY     dx'd sarcoid   KNEE ARTHROPLASTY Left 03/13/2017   NEUROPLASTY / TRANSPOSITION ULNAR NERVE AT ELBOW  09/2011   TOTAL ABDOMINAL HYSTERECTOMY      Current Outpatient Medications:    amLODipine  (NORVASC ) 10 MG tablet, Take 1 tablet (10 mg total) by mouth daily., Disp: 90 tablet, Rfl: 2   ascorbic acid (VITAMIN C) 500 MG tablet, Take 500 mg by mouth daily., Disp: , Rfl:    aspirin  EC 81 MG tablet, Take 81 mg by mouth daily. Swallow whole., Disp: , Rfl:    atorvastatin  (LIPITOR) 40 MG tablet, TAKE ONE TABLET BY MOUTH DAILY EACH MONDAY-FRIDAY. SKIP WEEKENDS., Disp: 90 tablet, Rfl: 1   Cholecalciferol (VITAMIN D -3 PO), Take 5,000 Units by mouth daily., Disp: , Rfl:    empagliflozin  (JARDIANCE ) 10 MG TABS tablet, Take 1 tablet (10 mg total) by mouth daily before breakfast., Disp: 90 tablet, Rfl: 2   ezetimibe  (ZETIA ) 10 MG tablet, Take 1 tablet (10 mg total) by mouth daily., Disp: 30 tablet, Rfl: 10   Fluocinolone  Acetonide Scalp 0.01 % OIL, Apply to affected area of scalp as directed 1-2 x/day as needed for itching, Disp: 118.28 mL, Rfl: 1   Menthol, Topical Analgesic, (BIOFREEZE ROLL-ON EX), Apply 1  application. topically as needed (pain in back,neck,thumb)., Disp: , Rfl:    nitroGLYCERIN  (NITROSTAT ) 0.4 MG SL tablet, Place 1 tablet (0.4 mg total) under the tongue every 5 (five) minutes as needed for chest pain., Disp: 25 tablet, Rfl: 2   nystatin (MYCOSTATIN/NYSTOP) powder, as needed (skin  irritation)., Disp: , Rfl:    Polyvinyl Alcohol-Povidone PF 1.4-0.6 % SOLN, Place 1 drop into both eyes daily as needed (dry eyes). , Disp: , Rfl:    RESTASIS 0.05 % ophthalmic emulsion, as needed (dry eyes)., Disp: , Rfl:    TART CHERRY PO, daily at 6 (six) AM., Disp: , Rfl:    valsartan  (DIOVAN ) 40 MG tablet, TAKE 1 TABLET BY MOUTH EVERY DAY, Disp: 90 tablet, Rfl: 1   valsartan  (DIOVAN ) 80 MG tablet, Take 1 tablet (80 mg total) by mouth daily., Disp: 90 tablet, Rfl: 2  Current Facility-Administered Medications:    0.9 %  sodium chloride  infusion, , Intravenous, PRN, Georgina Speaks, FNP  Clinical Interview:   The following information was obtained during a clinical interview with Ms. Farinas and her youngest daughter prior to cognitive testing.  Cognitive Symptoms: Decreased short-term memory: Endorsed. Ms. Gargan noted that her memory is not what it used to be and described trouble recalling names and details of recent conversations. She also reported misplacing objects in her environment. While she acknowledged that difficulties seem to have worsened over time, she was unable to establish a timeframe of concerns. Her daughter denied her observation of Ms. Camilli exhibiting any memory difficulties.  Decreased long-term memory: Denied. Decreased attention/concentration: Endorsed. She largely reported her perception of increased distractibility.  Reduced processing speed: Endorsed. Difficulties with executive functions: Endorsed. She reported some trouble with organization and multi-tasking. No issues with impulsivity or any significant personality changes were reported.  Difficulties with emotion regulation: Denied. Difficulties with receptive language: Denied. Difficulties with word finding: Endorsed. She also reported her perception of a diminished vocabulary.  Decreased visuoperceptual ability: Denied. However, she noted some instances where she may bruise after bumping into  furniture in her environment.   Difficulties completing ADLs: Endorsed. Her daughters have fully taken over medication management, financial management, and bill paying responsibilities. She no longer drives at the advice of her medical doctors.   Additional Medical History: History of traumatic brain injury/concussion: Denied. History of stroke: Denied. History of seizure activity: Denied. History of known exposure to toxins: Denied. Symptoms of chronic pain: Denied outside of diffuse joint pain which she attributed to aging.  Experience of frequent headaches/migraines: Denied. Frequent instances of dizziness/vertigo: Denied.  Sensory changes: She utilizes glasses with benefit. Other sensory concerns/difficulties (e.g., hearing, taste, smell) were denied. Her daughter did allude to perhaps some mild hearing loss.  Balance/coordination difficulties: Denied. She also denied any recent falls.  Other motor difficulties: Denied.  Sleep History: Estimated hours obtained each night: 6-8 hours.  Difficulties falling asleep: Denied. Difficulties staying asleep: Endorsed. She described instances where she will wake around 3:00 - 3:30 am for unknown reasons and then have trouble falling back asleep. On some occasions, she will be unable to fall back asleep at all.  Feels rested and refreshed upon awakening: Endorsed.  History of snoring: Endorsed. History of waking up gasping for air: Endorsed. Witnessed breath cessation while asleep: Endorsed. She has a history of obstructive sleep apnea and reported using her CPAP machine nightly.   History of vivid dreaming: Denied. Excessive movement while asleep: Denied. Instances of acting out her dreams: Denied.  Psychiatric/Behavioral Health History:  Depression: She described her current mood as okay and denied to her knowledge any prior mental health concerns or formal diagnoses. Current or remote suicidal ideation, intent, or plan was denied.   Anxiety: Denied. Mania: Denied. Trauma History: Denied. Visual/auditory hallucinations: Denied. Delusional thoughts: Denied.  Tobacco: Denied. Alcohol: She denied current alcohol consumption as well as a history of problematic alcohol abuse or dependence.  Recreational drugs: Denied.  Family History: Problem Relation Age of Onset   Allergies Mother    Heart failure Father    Allergies Sister    Heart disease Sister    Lupus Sister    Dementia Sister    Heart disease Sister    Gout Brother    Hypertension Brother    Heart disease Brother    Gout Brother    Hypertension Brother    Hypertension Brother    Hypertension Brother    Post-traumatic stress disorder Brother    Allergies Daughter    Healthy Daughter    Healthy Daughter    This information was confirmed by Ms. Sebastian.  Academic/Vocational History: Highest level of educational attainment: 16 years. She graduated from high school and earned a Oncologist in education. She described herself as an average (A/B/C) student in academic settings. Math perhaps represented a relative weakness.  History of developmental delay: Denied. History of grade repetition: Denied. Enrollment in special education courses: Denied. History of LD/ADHD: Denied.  Employment: Retired. She previously worked as a runner, broadcasting/film/video.   Evaluation Results:   Behavioral Observations: Ms. Radi was accompanied by her youngest daughter, arrived to her appointment on time, and was appropriately dressed and groomed. She appeared alert. Observed gait and station were within normal limits. Gross motor functioning appeared intact upon informal observation and no abnormal movements (e.g., tremors) were noted. Her affect was generally relaxed and positive. Thought processes were coherent, organized, and normal in content. Insight into her cognitive difficulties appeared somewhat adequate. However, she may not fully appreciate the extent of severe memory  impairment.   During testing, sustained attention was appropriate. Task engagement was adequate and she persisted when challenged. Overall, Ms. Knippenberg was cooperative with the clinical interview and subsequent testing procedures.   Adequacy of Effort: The validity of neuropsychological testing is limited by the extent to which the individual being tested may be assumed to have exerted adequate effort during testing. Ms. Sotero expressed her intention to perform to the best of her abilities and exhibited adequate task engagement and persistence. Scores across stand-alone and embedded performance validity measures were within expectation. As such, the results of the current evaluation are believed to be a valid representation of Ms. Zilka's current cognitive functioning.  Test Results: Ms. Costilow was oriented at the time of the current evaluation. She was unsure of the name of the current clinic.  Intellectual abilities based upon educational and vocational attainment were estimated to be in the average range. Premorbid abilities were estimated to be within the above average range based upon a single-word reading test.   Processing speed was mildly variable but overall appropriate, ranging from the below average to above average normative ranges. Basic attention was average. More complex attention (e.g., working memory) was variable, ranging from the well below average to average normative ranges. Executive functioning was exceptionally low to below average. She performed in the below average range across a task assessing safety and judgment. Points were largely lost due to the provision of vague responses.  Assessed receptive language abilities were average. Likewise, Ms. Sebastian  did not exhibit any difficulties comprehending task instructions and answered all questions asked of her appropriately. Assessed expressive language (e.g., verbal fluency and confrontation naming) was largely average.  There was some variability across semantic fluency (well below average to average).     Assessed visuospatial/visuoconstructional abilities were variable, ranging from the well below average to average normative ranges. Points were lost on her copy of a complex figure due to a few mild visual distortions and two internal aspects being omitted entirely.    Learning (i.e., encoding) of novel verbal information was exceptionally low to well below average. Spontaneous delayed recall (i.e., retrieval) of previously learned information was also exceptionally low to well below average. Retention rates were 0% across a list learning task, 50% across a story learning task, and 0% across a figure drawing task. Performance across recognition tasks was exceptionally low, suggesting negligible evidence for information consolidation.   Results of emotional screening instruments suggested that recent symptoms of generalized anxiety were in the mild to moderate range, while symptoms of depression were within the mild range. A screening instrument assessing recent sleep quality suggested the presence of minimal sleep dysfunction.  Tables of Scores:   Note: This summary of test scores accompanies the interpretive report and should not be considered in isolation without reference to the appropriate sections in the text. Descriptors are based on appropriate normative data and may be adjusted based on clinical judgment. Terms such as Within Normal Limits and Outside Normal Limits are used when a more specific description of the test score cannot be determined.       Percentile - Normative Descriptor > 98 - Exceptionally High 91-97 - Well Above Average 75-90 - Above Average 25-74 - Average 9-24 - Below Average 2-8 - Well Below Average < 2 - Exceptionally Low       Validity:   DESCRIPTOR       DCT: --- --- Within Normal Limits  RBANS EI: --- --- Within Normal Limits  WAIS-IV RDS: --- --- Within Normal Limits        Orientation:      Raw Score Percentile   NAB Orientation, Form 1 27/29 --- ---       Cognitive Screening:      Raw Score Percentile   SLUMS: 14/30 --- ---       RBANS, Form A: Standard Score/ Scaled Score Percentile   Total Score 63 1 Exceptionally Low  Immediate Memory 57 <1 Exceptionally Low    List Learning 2 <1 Exceptionally Low    Story Memory 4 2 Well Below Average  Visuospatial/Constructional 66 1 Exceptionally Low    Figure Copy 4 2 Well Below Average    Line Orientation 11/20 3-9 Well Below Average  Language 88 21 Below Average    Picture Naming 9/10 26-50 Average    Semantic Fluency 5 5 Well Below Average  Attention 94 34 Average    Digit Span 11 63 Average    Coding 7 16 Below Average  Delayed Memory 48 <1 Exceptionally Low    List Recall 0/10 <2 Exceptionally Low    List Recognition 14/20 <2 Exceptionally Low    Story Recall 4 2 Well Below Average    Story Recognition 5/12 1-2 Exceptionally Low    Figure Recall 1 <1 Exceptionally Low    Figure Recognition 0/8 1 Exceptionally Low        Intellectual Functioning:      Standard Score Percentile   Test of Premorbid Functioning: 110 75  Above Average       Attention/Executive Function:     Trail Making Test (TMT): Raw Score (T Score) Percentile     Part A 40 secs.,  0 errors (49) 46 Average    Part B 166 secs.,  0 errors (42) 21 Below Average         Scaled Score Percentile   WAIS-IV Digit Span: 7 16 Below Average    Forward 10 50 Average    Backward 8 25 Average    Sequencing 4 2 Well Below Average        Scaled Score Percentile   WAIS-IV Similarities: 9 37 Average       D-KEFS Color-Word Interference Test: Raw Score (Scaled Score) Percentile     Color Naming 28 secs. (12) 75 Above Average    Word Reading 21 secs. (12) 75 Above Average    Inhibition Discontinued --- Impaired    Inhibition/Switching Discontinued --- Impaired       D-KEFS Verbal Fluency Test: Raw Score (Scaled Score) Percentile      Letter Total Correct 32 (9) 37 Average    Category Total Correct 32 (10) 50 Average    Category Switching Total Correct 7 (4) 2 Well Below Average    Category Switching Accuracy 6 (5) 5 Well Below Average      Total Set Loss Errors 1 (11) 63 Average      Total Repetition Errors 7 (6) 9 Below Average       NAB Executive Functions Module, Form 1: T Score Percentile     Judgment 39 14 Below Average       Language:     Verbal Fluency Test: Raw Score (T Score) Percentile     Phonemic Fluency (FAS) 32 (47) 38 Average    Animal Fluency 15 (53) 62 Average        NAB Language Module, Form 1: T Score Percentile     Auditory Comprehension 54 66 Average    Naming 29/31 (47) 38 Average       Visuospatial/Visuoconstruction:      Raw Score Percentile   Clock Drawing: 10/10 --- Within Normal Limits        Scaled Score Percentile   WAIS-IV Block Design: 8 25 Average       Mood and Personality:      Raw Score Percentile   Geriatric Depression Scale: 11 --- Mild  Geriatric Anxiety Scale: 20 --- Mild    Somatic 6 --- Mild    Cognitive 6 --- Mild    Affective 8 --- Moderate       Additional Questionnaires:      Raw Score Percentile   PROMIS Sleep Disturbance Questionnaire: 20 --- None to Slight   Informed Consent and Coding/Compliance:   The current evaluation represents a clinical evaluation for the purposes previously outlined by the referral source and is in no way reflective of a forensic evaluation.   Ms. Lineberry was provided with a verbal description of the nature and purpose of the present neuropsychological evaluation. Also reviewed were the foreseeable risks and/or discomforts and benefits of the procedure, limits of confidentiality, and mandatory reporting requirements of this provider. The patient was given the opportunity to ask questions and receive answers about the evaluation. Oral consent to participate was provided by the patient.   This evaluation was conducted by  Arthea KYM Maryland, Ph.D., ABPP-CN, board certified clinical neuropsychologist. Ms. Sebastian completed a clinical interview with Dr. Maryland, billed as one unit [phone],  and 115 minutes of cognitive testing and scoring, billed as one unit [phone] and three additional units [id number]. Psychometrist Lonell Jude, B.S. assisted Dr. Richie with test administration and scoring procedures. As a separate and discrete service, one unit [id number] and two units [phone] were billed for Dr. Loralee time spent in interpretation and report writing.

## 2023-06-18 ENCOUNTER — Encounter: Payer: Self-pay | Admitting: Internal Medicine

## 2023-06-18 ENCOUNTER — Encounter: Payer: Medicare PPO | Admitting: Psychology

## 2023-06-18 ENCOUNTER — Ambulatory Visit: Payer: Medicare PPO | Admitting: Psychology

## 2023-06-18 DIAGNOSIS — F02A0 Dementia in other diseases classified elsewhere, mild, without behavioral disturbance, psychotic disturbance, mood disturbance, and anxiety: Secondary | ICD-10-CM | POA: Diagnosis not present

## 2023-06-18 DIAGNOSIS — G309 Alzheimer's disease, unspecified: Secondary | ICD-10-CM

## 2023-06-18 NOTE — Progress Notes (Signed)
   Neuropsychology Feedback Session Jolynn DEL. [facility] [place] Department of Neurology  Reason for Referral:   Mariah Burns is a 78 y.o. right-handed African-American female referred by Camie Sevin, PA-C, to characterize her current cognitive functioning and assist with diagnostic clarity and treatment planning in the context of subjective cognitive decline.   Feedback:   [name] completed a comprehensive neuropsychological evaluation on 06/11/2023. Please refer to that encounter for the full report and recommendations. Briefly, results suggested severe impairment surrounding all aspects of learning and memory. An additional impairment was exhibited across executive functioning, while performance variability was exhibited across complex attention, semantic fluency, and visuospatial abilities. The cause for her mild dementia presentation is somewhat unclear. With that being said, I do have concerns for an underlying neurodegenerative illness, namely Alzheimer's disease. Across memory tasks, [name] did not benefit from repeated exposure to novel information, was fully amnestic (i.e., 0% retention) across 2/3 memory tasks after a brief delay, and performed poorly across all yes/no recognition trials. Taken together, this suggests evidence for rapid forgetting and a pronounced storage impairment, both of which are the hallmark testing patterns of this illness. Further weakness/variability surrounding executive functioning, semantic fluency, and visuospatial abilities align reasonably well with expected progression. Intact confrontation naming is encouraging. Continued medical monitoring will be important moving forward.   [name] was accompanied by her daughter in-person and another daughter via speakerphone during the current feedback session. Content of the current session focused on the results of her neuropsychological evaluation. [name] was given the opportunity  to ask questions and her questions were answered. She was encouraged to reach out should additional questions arise. A copy of her report was provided at the conclusion of the visit.      One unit [phone] was billed for Dr. Loralee time spent preparing for, conducting, and documenting the current feedback session with [name].

## 2023-06-24 ENCOUNTER — Ambulatory Visit: Payer: Medicare PPO | Admitting: Physician Assistant

## 2023-06-24 VITALS — BP 155/70 | HR 67 | Ht 59.0 in | Wt 147.0 lb

## 2023-06-24 DIAGNOSIS — G309 Alzheimer's disease, unspecified: Secondary | ICD-10-CM

## 2023-06-24 DIAGNOSIS — F02A0 Dementia in other diseases classified elsewhere, mild, without behavioral disturbance, psychotic disturbance, mood disturbance, and anxiety: Secondary | ICD-10-CM | POA: Diagnosis not present

## 2023-06-24 MED ORDER — MEMANTINE HCL 5 MG PO TABS: ORAL_TABLET | ORAL | 11 refills | Status: DC

## 2023-06-24 NOTE — Progress Notes (Signed)
Assessment/Plan:   Dementia likely due to Alzheimer disease  Mariah Burns is a delightful 78 y.o. RH female with a history of hypertension, hyperlipidemia, Pre-Diabetes, arthritis, vitamin D deficiency, OSA on CPAP, sarcoidosis, history of right breast cancer, CKD stage II, depression, chronic diastolic heart failure, sinus bradycardia  seen today in follow up for memory loss. Patient is not on antidementia medication to date.  As recalled, her family wanted to wait for neuropsych evaluation to proceed with any medications.  She had been initially seen on 10/24/2022, but given the above diagnoses it is felt prudent to start therapy.  Because of her history of bradycardia, medication such as donepezil is not indicated as one of the side effects of it is low heart rate.  Discussed initiating memantine daily and its side effects, patient and her oldest daughter that is with her today would like to proceed.  All of their questions were answered to their satisfaction.    follow up in 6 months.  Start Memantine 5 mg: Take 1 tablet (5 mg at night) for 2 weeks, then increase to 1 tablet (5 mg) twice a day, side effects discussed    Continue CPAP for OSA Recommend good control of her cardiovascular risk factors patient informed of elevated blood pressure today. Recommend checking hearing for better comprehension. Continue to control mood as per PCP     Subjective:    This patient is accompanied in the office by her oldest daughter who supplements the history.  Previous records as well as any outside records available were reviewed prior to todays visit. Patient was last seen on 02/21/2023.  Any changes in memory since last visit? "  About the same.  She continues to have difficulty remembering conversations and names of people, except that now she may have a harder time recognizing faces.  She enjoys doing word finding and other brain activities. repeats oneself?  Endorsed Disoriented when  walking into a room?  Patient denies Leaving objects?  May misplace things but not in unusual places  Wandering behavior?  denies   Any personality changes since last visit?  denies   Any worsening depression?:  Denies.  She carries a history of depression and in the past she attended psychotherapy, not currently.  She also feels stressed sometimes with another daughter "she has OCD and can be difficult"-older sister says, patient nods and laughs. Hallucinations or paranoia?  Denies.   Seizures? denies    Any sleep changes?  Does not sleep well, waking up around 3 AM, and having difficulty going back to sleep.  She does put the TV on which may prevent her from falling asleep quicker.  Denies vivid dreams, REM behavior or sleepwalking   Sleep apnea?   Endorsed, uses CPAP. Any hygiene concerns? Denies.  Independent of bathing and dressing?  Endorsed  Does the patient needs help with medications?  Patient is in charge, daughter monitors Who is in charge of the finances?   Daughter is in charge Any changes in appetite?  denies.  She eats well.  She drinks plenty water. Patient have trouble swallowing? Denies.   Does the patient cook?  Yes, she may have forgotten some common recipes.  No accidents in the kitchen and reported. Any headaches?   denies   Chronic back pain  denies   Ambulates with difficulty? Denies. Recent falls or head injuries? denies     Unilateral weakness, numbness or tingling? denies   Any tremors?  Denies   Any  anosmia?  Denies   Any incontinence of urine?  Endorsed. Any bowel dysfunction?   Denies     Patient lives with her daughter Does the patient drive? No longer drives  MRI brain results June 2024 remarkable for mild chronic small vessel ischemic changes in the cerebral white matter and pons, similar to prior MRI in 2022, mild cerebellar atrophy ,no acute findings.    Neuropsych evaluation 06/11/2023 briefly, results suggested severe impairment surrounding all  aspects of learning and memory. An additional impairment was exhibited across executive functioning, while performance variability was exhibited across complex attention, semantic fluency, and visuospatial abilities. The cause for her mild dementia presentation is somewhat unclear. With that being said, I do have concerns for an underlying neurodegenerative illness, namely Alzheimer's disease. Across memory tasks, Mariah Burns did not benefit from repeated exposure to novel information, was fully amnestic (i.e., 0% retention) across 2/3 memory tasks after a brief delay, and performed poorly across all yes/no recognition trials. Taken together, this suggests evidence for rapid forgetting and a pronounced storage impairment, both of which are the hallmark testing patterns of this illness. Further weakness/variability surrounding executive functioning, semantic fluency, and visuospatial abilities align reasonably well with expected progression. Intact confrontation naming is encouraging. Continued medical monitoring will be important moving forward.     Initial visit 10/2022  How long did patient have memory difficulties? For the last 1.5 y-per daughter's report.  Patient has some difficulty remembering recent conversations and people's names that she may know and faces.  She is a retired Runner, broadcasting/film/video, and enjoys doing word finding, and other brain activities. repeats oneself?  Endorsed Disoriented when walking into a room?  Patient denies  Leaving objects in unusual places?  Patient denies, but endorsed by her daughter. Accidentally left the phone in the refrigerator (but a while ago).  She lost her glasses and could not find them, had to get new ones. Wandering behavior?  denies   Any personality changes?  Patient denies "but she curbs her condition with laughter and makes fun of herself"-daughter says Any history of depression?:  Endorsed, "many years ago, had psychotherapy "( but she does not elaborate).  Daughter reports that she has anxiety, because she takes care of everybody else.  Hallucinations or paranoia?  Patient denies   Seizures?   Patient denies    Any sleep changes?  Not sleeping well, wakes up at 3-4 then does not go back to sleep easily.  Denies vivid dreams, REM behavior or sleepwalking.   Sleep apnea?  Uses CPAP "just got a new machine". Any hygiene concerns?  Patient denies   Independent of bathing and dressing?  Endorsed  Does the patient needs help with medications? Patient  is in charge, daughter monitors.   Who is in charge of the finances? Daughter is in charge     Any changes in appetite?  "Eating more than before because I am in the house ".      Patient have trouble swallowing? denies   Does the patient cook?  Sometimes, may have forgotten common recipes because "she has not been using them in a long time ". Any kitchen accidents such as leaving the stove on? denies   Any headaches?   denies   Chronic back pain ? denies   Ambulates with difficulty?  denies   Recent falls or head injuries? denies   Vision changes? denies   Unilateral weakness, numbness or tingling? denies   Any tremors?   denies   Any  anosmia?  denies   Any incontinence of urine? Goes frequently because "I drink a lot of water ".  Any bowel dysfunction? denies      Patient lives with her daughter History of heavy alcohol intake? denies   History of heavy tobacco use? denies   Family history of dementia? Her sister has dementia.  Does patient drive? No, stopped 1 year ago,"my heart doctor told me to, because it may make me dizzy"   Pertinent labs April 2024 TSH 0.742 with T4 0.9, B12 1.280, normal CBC, unremarkable c-Met, LDL 112, otherwise normal lipid profile, A1c 6.2  PREVIOUS MEDICATIONS:   CURRENT MEDICATIONS:  Outpatient Encounter Medications as of 06/24/2023  Medication Sig   amLODipine (NORVASC) 10 MG tablet Take 1 tablet (10 mg total) by mouth daily.   ascorbic acid (VITAMIN C) 500  MG tablet Take 500 mg by mouth daily.   aspirin EC 81 MG tablet Take 81 mg by mouth daily. Swallow whole.   atorvastatin (LIPITOR) 40 MG tablet TAKE ONE TABLET BY MOUTH DAILY EACH MONDAY-FRIDAY. SKIP WEEKENDS.   Cholecalciferol (VITAMIN D-3 PO) Take 5,000 Units by mouth daily.   empagliflozin (JARDIANCE) 10 MG TABS tablet Take 1 tablet (10 mg total) by mouth daily before breakfast.   ezetimibe (ZETIA) 10 MG tablet Take 1 tablet (10 mg total) by mouth daily.   Fluocinolone Acetonide Scalp 0.01 % OIL Apply to affected area of scalp as directed 1-2 x/day as needed for itching   memantine (NAMENDA) 5 MG tablet Take 1 tablet (5 mg at night) for 2 weeks, then increase to 1 tablet (5 mg) twice a day   Menthol, Topical Analgesic, (BIOFREEZE ROLL-ON EX) Apply 1 application. topically as needed (pain in back,neck,thumb).   nitroGLYCERIN (NITROSTAT) 0.4 MG SL tablet Place 1 tablet (0.4 mg total) under the tongue every 5 (five) minutes as needed for chest pain.   nystatin (MYCOSTATIN/NYSTOP) powder as needed (skin irritation).   Polyvinyl Alcohol-Povidone PF 1.4-0.6 % SOLN Place 1 drop into both eyes daily as needed (dry eyes).    RESTASIS 0.05 % ophthalmic emulsion as needed (dry eyes).   TART CHERRY PO daily at 6 (six) AM.   valsartan (DIOVAN) 40 MG tablet TAKE 1 TABLET BY MOUTH EVERY DAY   valsartan (DIOVAN) 80 MG tablet Take 1 tablet (80 mg total) by mouth daily.   Facility-Administered Encounter Medications as of 06/24/2023  Medication   0.9 %  sodium chloride infusion       05/23/2021   10:21 AM  MMSE - Mini Mental State Exam  Orientation to time 5  Orientation to Place 4  Registration 3  Attention/ Calculation 3  Recall 1  Language- name 2 objects 2  Language- repeat 0  Language- follow 3 step command 3  Language- read & follow direction 1  Write a sentence 1  Copy design 1  Total score 24      10/24/2022    9:00 AM  Montreal Cognitive Assessment   Visuospatial/ Executive (0/5) 4   Naming (0/3) 3  Attention: Read list of digits (0/2) 2  Attention: Read list of letters (0/1) 1  Attention: Serial 7 subtraction starting at 100 (0/3) 0  Language: Repeat phrase (0/2) 2  Language : Fluency (0/1) 1  Abstraction (0/2) 2  Delayed Recall (0/5) 1  Orientation (0/6) 4  Total 20  Adjusted Score (based on education) 20    Objective:     PHYSICAL EXAMINATION:    VITALS:   Vitals:  06/24/23 1512  BP: (!) 155/70  Pulse: 67  SpO2: 98%  Weight: 147 lb (66.7 kg)  Height: 4\' 11"  (1.499 m)    GEN:  The patient appears stated age and is in NAD. HEENT:  Normocephalic, atraumatic.   Neurological examination:  General: NAD, well-groomed, appears stated age. Orientation: The patient is alert. Oriented to person, place and not to date Cranial nerves: There is good facial symmetry.The speech is fluent and clear. No aphasia or dysarthria. Fund of knowledge is appropriate. Recent and remote memory are impaired. Attention and concentration are reduced.  Able to name objects and repeat phrases.  Hearing is slightly decreased to conversational tone. Sensation: Sensation is intact to light touch throughout Motor: Strength is at least antigravity x4. DTR's 2/4 in UE/LE     Movement examination: Tone: There is normal tone in the UE/LE Abnormal movements:  no tremor.  No myoclonus.  No asterixis.   Coordination:  There is no decremation with RAM's. Normal finger to nose  Gait and Station: The patient has no difficulty arising out of a deep-seated chair without the use of the hands. The patient's stride length is good.  Gait is cautious and narrow.    Thank you for allowing [place] the opportunity to participate in the care of this nice patient. Please do not hesitate to contact us for any questions or concerns.   Total time spent on today's visit was 28 minutes dedicated to this patient today, preparing to see patient, examining the patient, ordering tests and/or medications and  counseling the patient, documenting clinical information in the EHR or other health record, independently interpreting results and communicating results to the patient/family, discussing treatment and goals, answering patient's questions and coordinating care.  Cc:  Dorothyann Peng, MD  Marlowe Kays 06/24/2023 5:40 PM

## 2023-06-24 NOTE — Patient Instructions (Addendum)
It was a pleasure to see you today at our office.   Recommendations:  Continue CPAP  Follow up in 6 Months Start Memantine    For assessment of decision of mental capacity and competency:  Call Dr. Erick Blinks, geriatric psychiatrist at [phone]  Counseling regarding caregiver distress, including caregiver depression, anxiety and issues regarding community resources, adult day care programs, adult living facilities, or memory care questions:  please contact your  Primary Doctor's Social Worker   Whom to call: Memory  decline, memory medications: Call our office [phone]  For psychiatric meds, mood meds: Please have your primary care physician manage these medications.  If you have any severe symptoms of a stroke, or other severe issues such as confusion,severe chills or fever, etc call 911 or go to the ER as you may need to be evaluated further    RECOMMENDATIONS FOR ALL PATIENTS WITH MEMORY PROBLEMS: 1. Continue to exercise (Recommend 30 minutes of walking everyday, or 3 hours every week) 2. Increase social interactions - continue going to [place] and enjoy social gatherings with friends and family 3. Eat healthy, avoid fried foods and eat more fruits and vegetables 4. Maintain adequate blood pressure, blood sugar, and blood cholesterol level. Reducing the risk of stroke and cardiovascular disease also helps promoting better memory. 5. Avoid stressful situations. Live a simple life and avoid aggravations. Organize your time and prepare for the next day in anticipation. 6. Sleep well, avoid any interruptions of sleep and avoid any distractions in the bedroom that may interfere with adequate sleep quality 7. Avoid sugar, avoid sweets as there is a strong link between excessive sugar intake, diabetes, and cognitive impairment We discussed the Mediterranean diet, which has been shown to help patients reduce the risk of progressive memory disorders and reduces cardiovascular risk.  This includes eating fish, eat fruits and green leafy vegetables, nuts like almonds and hazelnuts, walnuts, and also use olive oil. Avoid fast foods and fried foods as much as possible. Avoid sweets and sugar as sugar use has been linked to worsening of memory function.  There is always a concern of gradual progression of memory problems. If this is the case, then we may need to adjust level of care according to patient needs. Support, both to the patient and caregiver, should then be put into place.      You have been referred for a neuropsychological evaluation (i.e., evaluation of memory and thinking abilities). Please bring someone with you to this appointment if possible, as it is helpful for the doctor to hear from both you and another adult who knows you well. Please bring eyeglasses and hearing aids if you wear them.    The evaluation will take approximately 3 hours and has two parts:   The first part is a clinical interview with the neuropsychologist (Dr. Milbert Coulter or Dr. Roseanne Reno). During the interview, the neuropsychologist will speak with you and the individual you brought to the appointment.    The second part of the evaluation is testing with the doctor's technician Annabelle Harman or Selena Batten). During the testing, the technician will ask you to remember different types of material, solve problems, and answer some questionnaires. Your family member will not be present for this portion of the evaluation.   Please note: We must reserve several hours of the neuropsychologist's time and the psychometrician's time for your evaluation appointment. As such, there is a No-Show fee of $100. If you are unable to attend any of your appointments, please contact  our office as soon as possible to reschedule.    FALL PRECAUTIONS: Be cautious when walking. Scan the area for obstacles that may increase the risk of trips and falls. When getting up in the mornings, sit up at the edge of the bed for a few minutes before  getting out of bed. Consider elevating the bed at the head end to avoid drop of blood pressure when getting up. Walk always in a well-lit room (use night lights in the walls). Avoid area rugs or power cords from appliances in the middle of the walkways. Use a walker or a cane if necessary and consider physical therapy for balance exercise. Get your eyesight checked regularly.  FINANCIAL OVERSIGHT: Supervision, especially oversight when making financial decisions or transactions is also recommended.  HOME SAFETY: Consider the safety of the kitchen when operating appliances like stoves, microwave oven, and blender. Consider having supervision and share cooking responsibilities until no longer able to participate in those. Accidents with firearms and other hazards in the house should be identified and addressed as well.   ABILITY TO BE LEFT ALONE: If patient is unable to contact 911 operator, consider using LifeLine, or when the need is there, arrange for someone to stay with patients. Smoking is a fire hazard, consider supervision or cessation. Risk of wandering should be assessed by caregiver and if detected at any point, supervision and safe proof recommendations should be instituted.  MEDICATION SUPERVISION: Inability to self-administer medication needs to be constantly addressed. Implement a mechanism to ensure safe administration of the medications.     Mediterranean Diet A Mediterranean diet refers to food and lifestyle choices that are based on the traditions of countries located on the [employer] [place]. This way of eating has been shown to help prevent certain conditions and improve outcomes for people who have chronic diseases, like kidney disease and heart disease. What are tips for following this plan? Lifestyle  Cook and eat meals together with your family, when possible. Drink enough fluid to keep your urine clear or pale yellow. Be physically active every day. This includes: Aerobic  exercise like running or swimming. Leisure activities like gardening, walking, or housework. Get 7-8 hours of sleep each night. If recommended by your health care provider, drink red wine in moderation. This means 1 glass a day for nonpregnant women and 2 glasses a day for men. A glass of wine equals 5 oz (150 mL). Reading food labels  Check the serving size of packaged foods. For foods such as rice and pasta, the serving size refers to the amount of cooked product, not dry. Check the total fat in packaged foods. Avoid foods that have saturated fat or trans fats. Check the ingredients list for added sugars, such as corn syrup. Shopping  At the grocery store, buy most of your food from the areas near the walls of the store. This includes: Fresh fruits and vegetables (produce). Grains, beans, nuts, and seeds. Some of these may be available in unpackaged forms or large amounts (in bulk). Fresh seafood. Poultry and eggs. Low-fat dairy products. Buy whole ingredients instead of prepackaged foods. Buy fresh fruits and vegetables in-season from local farmers markets. Buy frozen fruits and vegetables in resealable bags. If you do not have access to quality fresh seafood, buy precooked frozen shrimp or canned fish, such as tuna, salmon, or sardines. Buy small amounts of raw or cooked vegetables, salads, or olives from the deli or salad bar at your store. Stock Water quality scientist so you  always have certain foods on hand, such as olive oil, canned tuna, canned tomatoes, rice, pasta, and beans. Cooking  Cook foods with extra-virgin olive oil instead of using butter or other vegetable oils. Have meat as a side dish, and have vegetables or grains as your main dish. This means having meat in small portions or adding small amounts of meat to foods like pasta or stew. Use beans or vegetables instead of meat in common dishes like chili or lasagna. Experiment with different cooking methods. Try roasting or broiling  vegetables instead of steaming or sauteing them. Add frozen vegetables to soups, stews, pasta, or rice. Add nuts or seeds for added healthy fat at each meal. You can add these to yogurt, salads, or vegetable dishes. Marinate fish or vegetables using olive oil, lemon juice, garlic, and fresh herbs. Meal planning  Plan to eat 1 vegetarian meal one day each week. Try to work up to 2 vegetarian meals, if possible. Eat seafood 2 or more times a week. Have healthy snacks readily available, such as: Vegetable sticks with hummus. Greek yogurt. Fruit and nut trail mix. Eat balanced meals throughout the week. This includes: Fruit: 2-3 servings a day Vegetables: 4-5 servings a day Low-fat dairy: 2 servings a day Fish, poultry, or lean meat: 1 serving a day Beans and legumes: 2 or more servings a week Nuts and seeds: 1-2 servings a day Whole grains: 6-8 servings a day Extra-virgin olive oil: 3-4 servings a day Limit red meat and sweets to only a few servings a month What are my food choices? Mediterranean diet Recommended Grains: Whole-grain pasta. Brown rice. Bulgar wheat. Polenta. Couscous. Whole-wheat bread. Orpah Cobb. Vegetables: Artichokes. Beets. Broccoli. Cabbage. Carrots. Eggplant. Green beans. Chard. Kale. Spinach. Onions. Leeks. Peas. Squash. Tomatoes. Peppers. Radishes. Fruits: Apples. Apricots. Avocado. Berries. Bananas. Cherries. Dates. Figs. Grapes. Lemons. Melon. Oranges. Peaches. Plums. Pomegranate. Meats and other protein foods: Beans. Almonds. Sunflower seeds. Pine nuts. Peanuts. Cod. Salmon. Scallops. Shrimp. Tuna. Tilapia. Clams. Oysters. Eggs. Dairy: Low-fat milk. Cheese. Greek yogurt. Beverages: Water. Red wine. Herbal tea. Fats and oils: Extra virgin olive oil. Avocado oil. Grape seed oil. Sweets and desserts: [place] yogurt with honey. Baked apples. Poached pears. Trail mix. Seasoning and other foods: Basil. Cilantro. Coriander. Cumin. Mint. Parsley. Sage. Rosemary.  Tarragon. Garlic. Oregano. Thyme. Pepper. Balsalmic vinegar. Tahini. Hummus. Tomato sauce. Olives. Mushrooms. Limit these Grains: Prepackaged pasta or rice dishes. Prepackaged cereal with added sugar. Vegetables: Deep fried potatoes (french fries). Fruits: Fruit canned in syrup. Meats and other protein foods: Beef. Pork. Lamb. Poultry with skin. Hot dogs. Tomasa Blase. Dairy: Ice cream. Sour cream. Whole milk. Beverages: Juice. Sugar-sweetened soft drinks. Beer. Liquor and spirits. Fats and oils: Butter. Canola oil. Vegetable oil. Beef fat (tallow). Lard. Sweets and desserts: Cookies. Cakes. Pies. Candy. Seasoning and other foods: Mayonnaise. Premade sauces and marinades. The items listed may not be a complete list. Talk with your dietitian about what dietary choices are right for you. Summary The Mediterranean diet includes both food and lifestyle choices. Eat a variety of fresh fruits and vegetables, beans, nuts, seeds, and whole grains. Limit the amount of red meat and sweets that you eat. Talk with your health care provider about whether it is safe for you to drink red wine in moderation. This means 1 glass a day for nonpregnant women and 2 glasses a day for men. A glass of wine equals 5 oz (150 mL). This information is not intended to replace advice given to you by your  health care provider. Make sure you discuss any questions you have with your health care provider. Document Released: 01/12/2016 Document Revised: 02/14/2016 Document Reviewed: 01/12/2016 Elsevier Interactive Patient Education  2017 [employer].    [facility] for MRI [phone]

## 2023-07-02 ENCOUNTER — Telehealth: Payer: Self-pay | Admitting: Internal Medicine

## 2023-07-02 DIAGNOSIS — G4733 Obstructive sleep apnea (adult) (pediatric): Secondary | ICD-10-CM

## 2023-07-04 NOTE — Telephone Encounter (Signed)
Order- [facility]- please replace old CPAP "motor life exceeded" auto 4-10, mask of choice, humidifier, supplies, AirView/ card

## 2023-07-04 NOTE — Telephone Encounter (Signed)
Dr Maple Hudson- please advise if you are okay with order for new CPAP. She has received the exceeded life expectancy warning on her unit. Thanks!

## 2023-07-08 NOTE — Telephone Encounter (Signed)
I have placed referral for new CPAP  Called and spoke with the pt and notified that this was done  Nothing further needed

## 2023-07-16 ENCOUNTER — Other Ambulatory Visit: Payer: Self-pay | Admitting: Physician Assistant

## 2023-07-21 ENCOUNTER — Other Ambulatory Visit: Payer: Self-pay | Admitting: Internal Medicine

## 2023-07-21 DIAGNOSIS — E7800 Pure hypercholesterolemia, unspecified: Secondary | ICD-10-CM

## 2023-07-25 ENCOUNTER — Ambulatory Visit: Payer: Medicare PPO | Admitting: Internal Medicine

## 2023-07-26 DIAGNOSIS — G4733 Obstructive sleep apnea (adult) (pediatric): Secondary | ICD-10-CM | POA: Diagnosis not present

## 2023-07-28 ENCOUNTER — Other Ambulatory Visit: Payer: Self-pay | Admitting: Internal Medicine

## 2023-08-08 ENCOUNTER — Ambulatory Visit: Payer: Medicare PPO | Admitting: Internal Medicine

## 2023-08-08 ENCOUNTER — Encounter: Payer: Self-pay | Admitting: Internal Medicine

## 2023-08-08 VITALS — BP 124/60 | HR 65 | Temp 98.3°F | Ht 59.0 in | Wt 150.8 lb

## 2023-08-08 DIAGNOSIS — G309 Alzheimer's disease, unspecified: Secondary | ICD-10-CM | POA: Diagnosis not present

## 2023-08-08 DIAGNOSIS — E66811 Obesity, class 1: Secondary | ICD-10-CM

## 2023-08-08 DIAGNOSIS — I129 Hypertensive chronic kidney disease with stage 1 through stage 4 chronic kidney disease, or unspecified chronic kidney disease: Secondary | ICD-10-CM

## 2023-08-08 DIAGNOSIS — R413 Other amnesia: Secondary | ICD-10-CM

## 2023-08-08 DIAGNOSIS — N182 Chronic kidney disease, stage 2 (mild): Secondary | ICD-10-CM | POA: Diagnosis not present

## 2023-08-08 DIAGNOSIS — F02A0 Dementia in other diseases classified elsewhere, mild, without behavioral disturbance, psychotic disturbance, mood disturbance, and anxiety: Secondary | ICD-10-CM | POA: Diagnosis not present

## 2023-08-08 DIAGNOSIS — E1122 Type 2 diabetes mellitus with diabetic chronic kidney disease: Secondary | ICD-10-CM | POA: Diagnosis not present

## 2023-08-08 DIAGNOSIS — Z6830 Body mass index (BMI) 30.0-30.9, adult: Secondary | ICD-10-CM

## 2023-08-08 DIAGNOSIS — E6609 Other obesity due to excess calories: Secondary | ICD-10-CM | POA: Diagnosis not present

## 2023-08-08 DIAGNOSIS — H9193 Unspecified hearing loss, bilateral: Secondary | ICD-10-CM

## 2023-08-08 DIAGNOSIS — Z1231 Encounter for screening mammogram for malignant neoplasm of breast: Secondary | ICD-10-CM

## 2023-08-08 LAB — POCT URINALYSIS DIPSTICK
Bilirubin, UA: NEGATIVE
Blood, UA: NEGATIVE
Glucose, UA: POSITIVE — AB
Ketones, UA: NEGATIVE
Nitrite, UA: NEGATIVE
Protein, UA: NEGATIVE
Spec Grav, UA: 1.01 (ref 1.010–1.025)
Urobilinogen, UA: 0.2 U/dL
pH, UA: 5.5 (ref 5.0–8.0)

## 2023-08-08 NOTE — Patient Instructions (Signed)

## 2023-08-08 NOTE — Progress Notes (Signed)
 I,Victoria T Deloria Lair, CMA,acting as a Neurosurgeon for Mariah Aliment, MD.,have documented all relevant documentation on the behalf of Mariah Aliment, MD,as directed by  Mariah Aliment, MD while in the presence of Mariah Aliment, MD.  Subjective:  Patient ID: Mariah Burns , female    DOB: June 04, 1946 , 78 y.o.   MRN: [id number]  Chief Complaint  Patient presents with   Diabetes   Hypertension    HPI  The patient is here today for a diabetes and blood pressure f/u.  She reports compliance with meds. She denies headaches, chest pain and shortness of breath. She is accompanied by her daughter, Gates Rigg.   She reports she will schedule eye exam & mammogram.     Diabetes She presents for her follow-up diabetic visit. She has type 2 diabetes mellitus. Her disease course has been stable. Pertinent negatives for diabetes include no blurred vision. There are no hypoglycemic complications. Diabetic complications include nephropathy. Risk factors for coronary artery disease include diabetes mellitus, dyslipidemia, hypertension, sedentary lifestyle and post-menopausal. She is compliant with treatment most of the time.  Hypertension This is a chronic problem. The current episode started more than 1 year ago. The problem has been gradually improving since onset. The problem is controlled. Pertinent negatives include no blurred vision.     Past Medical History:  Diagnosis Date   Abdominal bloating 12/14/2022   Arthritis    Chronic bilateral low back pain without sciatica 10/28/2019   Chronic diastolic heart failure 06/06/2021   Class 1 obesity due to excess calories with serious comorbidity and body mass index (BMI) of 30.0 to 30.9 in adult 10/28/2019   Cyst of right breast 01/30/2017   1 cm     Dry mouth 11/19/2020   She is encouraged to increase fluid intake and to consider use of Biotene products. Also advised to increase her intake of healthy fats - walnuts, avocado etc.      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 Daughter verbally acknowledges  treatment plan.     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 We discussed the use of Trintellix, she declines at this time.      Stage 2 chronic kidney disease 05/15/2018   Type 2 diabetes mellitus, without long-term current use of insulin 05/06/2010   Vitamin D deficiency 10/28/2019     Family History  Problem Relation Age of Onset   Allergies Mother    Heart failure Father    Allergies Sister    Heart disease Sister    Lupus Sister    Dementia Sister     Heart disease Sister    Gout Brother    Hypertension Brother    Heart disease Brother    Gout Brother    Hypertension Brother    Hypertension Brother    Hypertension Brother    Post-traumatic stress disorder Brother    Allergies Daughter    Healthy Daughter    Healthy Daughter      Current Outpatient Medications:    amLODipine (NORVASC) 10 MG tablet, Take 1 tablet (10 mg total) by mouth daily., Disp: 90 tablet, Rfl: 2   ascorbic acid (VITAMIN C) 500 MG tablet, Take 500 mg by mouth daily., Disp: , Rfl:    aspirin EC 81 MG tablet, Take 81 mg by mouth daily. Swallow whole., Disp: , Rfl:    atorvastatin (LIPITOR) 40 MG tablet, TAKE 1 TABLET BY MOUTH DAILY MONDAY THROUGH FRIDAY. SKIP WEEKENDS, Disp: 60 tablet, Rfl: 2   Cholecalciferol (VITAMIN D-3 PO), Take 5,000 Units by mouth daily., Disp: , Rfl:    empagliflozin (JARDIANCE) 10 MG TABS tablet, Take 1 tablet (10 mg total) by mouth daily before breakfast., Disp: 90 tablet, Rfl: 2   ezetimibe (ZETIA) 10 MG tablet, TAKE 1 TABLET BY MOUTH EVERY DAY, Disp: 90 tablet, Rfl: 1   Fluocinolone Acetonide Scalp 0.01 % OIL, Apply to affected area of scalp as directed 1-2 x/day as needed for itching, Disp: 118.28 mL, Rfl: 1   memantine (NAMENDA) 5 MG tablet, TAKE 1 TABLET (5 MG AT NIGHT) FOR 2 WEEKS, THEN INCREASE TO 1 TABLET (5 MG) TWICE A DAY, Disp: 180 tablet, Rfl: 1   Menthol, Topical Analgesic, (BIOFREEZE ROLL-ON EX), Apply 1 application. topically as needed (pain in back,neck,thumb)., Disp: , Rfl:    nystatin (MYCOSTATIN/NYSTOP) powder, as needed (skin irritation)., Disp: , Rfl:    Polyvinyl Alcohol-Povidone PF 1.4-0.6 % SOLN, Place 1 drop into both eyes daily as needed (dry eyes). , Disp: , Rfl:    RESTASIS 0.05 % ophthalmic emulsion, as needed (dry eyes)., Disp: , Rfl:    TART CHERRY PO, daily at 6 (six) AM., Disp: , Rfl:    valsartan (DIOVAN) 40 MG tablet, TAKE 1 TABLET BY MOUTH EVERY DAY, Disp: 90 tablet, Rfl: 1   nitroGLYCERIN (NITROSTAT) 0.4  MG SL tablet, Place 1 tablet (0.4 mg total) under the tongue every 5 (five) minutes as needed for chest pain. (Patient not taking: Reported on 08/08/2023), Disp: 25 tablet, Rfl: 2   valsartan (DIOVAN) 80 MG tablet, Take 1 tablet (80 mg total) by mouth daily. (Patient not taking: Reported on 08/08/2023), Disp: 90 tablet, Rfl: 2  Current Facility-Administered Medications:    0.9 %  sodium chloride infusion, , Intravenous, PRN, Arnette Felts, FNP   Allergies  Allergen Reactions   Fosamax [Alendronate Sodium] Other (See Comments)    REACTION: "CAUSE GI UPSET AND FATIGUE"   Omeprazole Other (See Comments)    Lower ab pain   Motrin [Ibuprofen] Other (See Comments)  GI upset- has to eat prior to taking   Fluvastatin Other (See Comments)    mild memoryproblems of confusion      Review of Systems  Constitutional: Negative.   Eyes:  Negative for blurred vision.  Respiratory: Negative.    Cardiovascular: Negative.   Gastrointestinal: Negative.   Neurological: Negative.   Psychiatric/Behavioral: Negative.       Today's Vitals   08/08/23 1130  BP: 124/60  Pulse: 65  Temp: 98.3 F (36.8 C)  SpO2: 98%  Weight: 150 lb 12.8 oz (68.4 kg)  Height: 4\' 11"  (1.499 m)   Body mass index is 30.46 kg/m.  Wt Readings from Last 3 Encounters:  08/08/23 150 lb 12.8 oz (68.4 kg)  06/24/23 147 lb (66.7 kg)  05/16/23 144 lb (65.3 kg)     Objective:  Physical Exam Vitals and nursing note reviewed.  Constitutional:      Appearance: Normal appearance. She is obese.  HENT:     Head: Normocephalic and atraumatic.  Eyes:     Extraocular Movements: Extraocular movements intact.  Cardiovascular:     Rate and Rhythm: Normal rate and regular rhythm.     Heart sounds: Normal heart sounds.  Pulmonary:     Effort: Pulmonary effort is normal.     Breath sounds: Normal breath sounds.  Musculoskeletal:     Cervical back: Normal range of motion.  Skin:    General: Skin is warm.  Neurological:      General: No focal deficit present.     Mental Status: She is alert.  Psychiatric:        Mood and Affect: Mood normal.        Behavior: Behavior normal.         Assessment And Plan:  Type 2 diabetes mellitus with stage 2 chronic kidney disease, without long-term current use of insulin (HCC) Assessment & Plan: Chronic, diabetic foot exam was performed.  She is currently on Jardiance 10mg  daily. She is encouraged to stay well hydrated while on this medication. She will f/u in four months for re-evaluation.  Orders: -     CMP14+EGFR -     Hemoglobin A1c -     POCT urinalysis dipstick -     Microalbumin / creatinine urine ratio  Parenchymal renal hypertension, stage 1 through stage 4 or unspecified chronic kidney disease Assessment & Plan: Chronic, well controlled. She will continue amlodipine 10mg  daily and valsartan 40mg  daily.  She is encouraged to follow low sodium diet. She will f/u in four months for re-evaluation.   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 She will c/w memantine 5mg  twice daily.     Hearing deficit, bilateral Assessment & Plan: Audiology input appreciated. No hearing aids needed at this time. It is advised to schedule yearly testing due to diabetes. Pt is in agreement with yearly testing.    Class 1 obesity due to excess calories with serious comorbidity and body mass index (BMI) of 30.0 to 30.9 in adult Assessment & Plan: She is encouraged to aim for at least 150 minutes of exercise per week.       Return for 4 month dm f/u. Marland Kitchen  Patient was given opportunity to ask  questions. Patient verbalized understanding of the plan and was able to repeat key elements of  the plan. All questions were answered to their satisfaction.    I, Mariah Aliment, MD, have reviewed all documentation for this visit. The documentation on 08/08/23 for the exam, diagnosis, procedures, and orders are all accurate and complete.   IF YOU HAVE BEEN REFERRED TO A SPECIALIST, IT MAY TAKE 1-2 WEEKS TO SCHEDULE/PROCESS THE REFERRAL. IF YOU HAVE NOT HEARD FROM US/SPECIALIST IN TWO WEEKS, PLEASE GIVE [place] A CALL AT [phone].   THE PATIENT IS ENCOURAGED TO PRACTICE SOCIAL DISTANCING DUE TO THE COVID-19 PANDEMIC.

## 2023-08-09 ENCOUNTER — Encounter: Payer: Self-pay | Admitting: Internal Medicine

## 2023-08-09 LAB — MICROALBUMIN / CREATININE URINE RATIO
Creatinine, Urine: 44.2 mg/dL
Microalb/Creat Ratio: 37 mg/g{creat} — ABNORMAL HIGH (ref 0–29)
Microalbumin, Urine: 16.2 ug/mL

## 2023-08-09 LAB — CMP14+EGFR
ALT: 34 IU/L — ABNORMAL HIGH (ref 0–32)
AST: 25 IU/L (ref 0–40)
Albumin: 4.3 g/dL (ref 3.8–4.8)
Alkaline Phosphatase: 102 IU/L (ref 44–121)
BUN/Creatinine Ratio: 14 (ref 12–28)
BUN: 12 mg/dL (ref 8–27)
Bilirubin Total: 0.4 mg/dL (ref 0.0–1.2)
CO2: 23 mmol/L (ref 20–29)
Calcium: 9.7 mg/dL (ref 8.7–10.3)
Chloride: 105 mmol/L (ref 96–106)
Creatinine, Ser: 0.87 mg/dL (ref 0.57–1.00)
Globulin, Total: 3.2 g/dL (ref 1.5–4.5)
Glucose: 100 mg/dL — ABNORMAL HIGH (ref 70–99)
Potassium: 3.8 mmol/L (ref 3.5–5.2)
Sodium: 142 mmol/L (ref 134–144)
Total Protein: 7.5 g/dL (ref 6.0–8.5)
eGFR: 68 mL/min/{1.73_m2} (ref >59)

## 2023-08-09 LAB — HEMOGLOBIN A1C
Est. average glucose Bld gHb Est-mCnc: 134 mg/dL
Hgb A1c MFr Bld: 6.3 % — ABNORMAL HIGH (ref 4.8–5.6)

## 2023-08-12 ENCOUNTER — Encounter: Payer: Self-pay | Admitting: Internal Medicine

## 2023-08-17 DIAGNOSIS — H9193 Unspecified hearing loss, bilateral: Secondary | ICD-10-CM | POA: Insufficient documentation

## 2023-08-17 NOTE — Assessment & Plan Note (Signed)
 She is encouraged to aim for at least 150 minutes of exercise per week.

## 2023-08-17 NOTE — Assessment & Plan Note (Signed)
 Chronic, diabetic foot exam was performed.  She is currently on Jardiance 10mg  daily. She is encouraged to stay well hydrated while on this medication. She will f/u in four months for re-evaluation.

## 2023-08-17 NOTE — Assessment & Plan Note (Signed)
 Chronic, well controlled. She will continue amlodipine 10mg  daily and valsartan 40mg  daily.  She is encouraged to follow low sodium diet. She will f/u in four months for re-evaluation.

## 2023-08-17 NOTE — Assessment & Plan Note (Signed)
 Audiology input appreciated. No hearing aids needed at this time. It is advised to schedule yearly testing due to diabetes. Pt is in agreement with yearly testing.

## 2023-08-17 NOTE — Assessment & Plan Note (Signed)
 She has had Neuropsych eval, Neurology input also appreciated. She will c/w memantine 5mg  twice daily.

## 2023-08-21 ENCOUNTER — Other Ambulatory Visit: Payer: Self-pay | Admitting: Internal Medicine

## 2023-08-21 DIAGNOSIS — I10 Essential (primary) hypertension: Secondary | ICD-10-CM

## 2023-08-23 DIAGNOSIS — G4733 Obstructive sleep apnea (adult) (pediatric): Secondary | ICD-10-CM | POA: Diagnosis not present

## 2023-08-28 ENCOUNTER — Other Ambulatory Visit: Payer: Self-pay

## 2023-08-28 DIAGNOSIS — C50411 Malignant neoplasm of upper-outer quadrant of right female breast: Secondary | ICD-10-CM

## 2023-08-28 NOTE — Progress Notes (Unsigned)
 Patient Care Team: Dorothyann Peng, MD as PCP - General (Internal Medicine) Christell Constant, MD as PCP - Cardiology (Cardiology) Pershing Proud, RN as Oncology Nurse Navigator Donnelly Angelica, RN as Oncology Nurse Navigator Glenna Fellows, MD (Inactive) as Consulting Physician (General Surgery) Malachy Mood, MD as Consulting Physician (Hematology) Antony Blackbird, MD as Consulting Physician (Radiation Oncology) Pollyann Samples, NP as Nurse Practitioner (Nurse Practitioner) Glyn Ade, PA-C as Physician Assistant (Dermatology) Chalmers Guest, MD as Consulting Physician (Ophthalmology) Elwyn Reach (Neurology)  Clinic Day:  08/30/2023  Referring physician: Dorothyann Peng, MD  ASSESSMENT & PLAN:   Assessment & Plan: Malignant neoplasm of upper-outer quadrant of right breast in female, estrogen receptor positive Stage IA, pT2N1aM0, ER/PR+, HER2-, Grade I, Mammaprint low risk  -Diagnosed in 08/2018. S/p right lumpectomy with SLNB on 09/02/18 adjuvant Radiation therapy.  -Mammaprint showed low risk, Adjuvant chemo was not recommended.  -Given her ER/PR+ disease, she tried antiestrogen therapy with Anastrozole, Exemestane and Tamoxifen over 3-4 months total. Given poor tolerance, she stopped and proceeded with surveillance as of 06/2019.  -Last mammogram 10/2021 was benign -Ms. Dorsainvil is clinically doing well from a breast cancer standpoint.  Breast exam is benign, labs are unremarkable.  Overall no clinical concern for recurrence. -She is 4 years from definitive treatment, recurrence risk has decreased.  Continue surveillance -Most recent screening mammogram done 10/09/2022 at [facility] mammography.  She had benign results.  Due for next screening mammogram May, 2025. -Follow-up with me in 1 year, or sooner if needed   Elevated liver functions Reviewed labs showing mild elevation of AST and ALT.  Most likely due to medications for cholesterol (atorvastatin and Zetia).   Advised she speak to her primary care about labs and have them rechecked.  May consider trial of different statin, less toxic on the liver.  Plan: Labs reviewed.  Mild elevation of AST and ALT, other labs stable and within normal limits. Most recent screening mammogram from 10/09/2022 reviewed.  Benign results. New screening mammogram ordered for May 2025. Will continue with breast cancer surveillance. Labs with follow-up in 1 year.  She knows she can follow-up sooner if needed.  The patient understands the plans discussed today and is in agreement with them.  She knows to contact our office if she develops concerns prior to her next appointment.  I provided 25 minutes of face-to-face time during this encounter and > 50% was spent counseling as documented under my assessment and plan.    Carlean Jews, NP  [facility] - A DEPT OF [facility] [address] [place] [address] Dept: [phone] Dept Fax: [phone]   Orders Placed This Encounter  Procedures   MM 3D SCREENING MAMMOGRAM BILATERAL BREAST    Standing Status:   Future    Expected Date:   10/10/2023    Expiration Date:   08/28/2024    Reason for Exam (SYMPTOM  OR DIAGNOSIS REQUIRED):   screening for breast cancer. history of right breast cancer    Preferred imaging location?:   External             [facility]      CHIEF COMPLAINT:  CC: Right breast cancer, estrogen receptor positive  Current Treatment: Surveillance  INTERVAL HISTORY:  Anaysha is here today for repeat clinical assessment.  She was last seen by Clayborn Heron, NP on 08/29/2022.  Most recent mammogram performed in May, 2024  with benign results.  She goes to [facility] mammography.  Patient states she has been doing well since her last visit.  She has no concerns or complaints.  She denies chest pain, chest pressure, or shortness of breath. She denies headaches or visual disturbances. She denies  abdominal pain, nausea, vomiting, or changes in bowel or bladder habits.  She denies changes in the breasts.  Has not noted new lumps or masses.  No changes in the nipples.  She denies fevers or chills. She denies pain. Her appetite is good. Her weight has been stable.  I have reviewed the past medical history, past surgical history, social history and family history with the patient and they are unchanged from previous note.  ALLERGIES:  is allergic to fosamax [alendronate sodium], omeprazole, motrin [ibuprofen], and fluvastatin.  MEDICATIONS:  Current Outpatient Medications  Medication Sig Dispense Refill   amLODipine (NORVASC) 10 MG tablet Take 1 tablet (10 mg total) by mouth daily. 90 tablet 2   ascorbic acid (VITAMIN C) 500 MG tablet Take 500 mg by mouth daily.     aspirin EC 81 MG tablet Take 81 mg by mouth daily. Swallow whole.     atorvastatin (LIPITOR) 40 MG tablet TAKE 1 TABLET BY MOUTH DAILY MONDAY THROUGH FRIDAY. SKIP WEEKENDS 60 tablet 2   Cholecalciferol (VITAMIN D-3 PO) Take 5,000 Units by mouth daily.     empagliflozin (JARDIANCE) 10 MG TABS tablet Take 1 tablet (10 mg total) by mouth daily before breakfast. 90 tablet 2   ezetimibe (ZETIA) 10 MG tablet TAKE 1 TABLET BY MOUTH EVERY DAY 90 tablet 1   Fluocinolone Acetonide Scalp 0.01 % OIL Apply to affected area of scalp as directed 1-2 x/day as needed for itching 118.28 mL 1   memantine (NAMENDA) 5 MG tablet TAKE 1 TABLET (5 MG AT NIGHT) FOR 2 WEEKS, THEN INCREASE TO 1 TABLET (5 MG) TWICE A DAY 180 tablet 1   Menthol, Topical Analgesic, (BIOFREEZE ROLL-ON EX) Apply 1 application. topically as needed (pain in back,neck,thumb).     nitroGLYCERIN (NITROSTAT) 0.4 MG SL tablet Place 1 tablet (0.4 mg total) under the tongue every 5 (five) minutes as needed for chest pain. 25 tablet 2   nystatin (MYCOSTATIN/NYSTOP) powder as needed (skin irritation).     Polyvinyl Alcohol-Povidone PF 1.4-0.6 % SOLN Place 1 drop into both eyes daily as  needed (dry eyes).      RESTASIS 0.05 % ophthalmic emulsion as needed (dry eyes).     TART CHERRY PO daily at 6 (six) AM.     valsartan (DIOVAN) 40 MG tablet TAKE 1 TABLET BY MOUTH EVERY DAY 90 tablet 1   valsartan (DIOVAN) 80 MG tablet Take 1 tablet (80 mg total) by mouth daily. 90 tablet 2   Current Facility-Administered Medications  Medication Dose Route Frequency Provider Last Rate Last Admin   0.9 %  sodium chloride infusion   Intravenous PRN Arnette Felts, FNP        HISTORY OF PRESENT ILLNESS:   Oncology History Overview Note  Cancer Staging Malignant neoplasm of upper-outer quadrant of right breast in female, estrogen receptor positive (HCC) Staging form: Breast, AJCC 8th Edition - Clinical stage from 08/04/2018: Stage IA (cT1c, cN0, cM0, G2, ER+, PR+, HER2-) - Signed by Malachy Mood, MD on 08/12/2018 - Pathologic stage from 09/02/2018: Stage IA (pT2, pN1a, cM0, G1, ER+, PR+, HER2-) - Signed by Malachy Mood, MD on 09/29/2018     Malignant neoplasm of upper-outer quadrant of right breast  in female, estrogen receptor positive  07/29/2018 Mammogram   Diagnostic Mammgram 07/29/18  IMPRESSION:  The 1.7cm oval mass in the right breast upper outer qudrant middle third, 3-5 from nipple is suspicious of malignancy.    08/04/2018 Cancer Staging   Staging form: Breast, AJCC 8th Edition - Clinical stage from 08/04/2018: Stage IA (cT1c, cN0, cM0, G2, ER+, PR+, HER2-) - Signed by Malachy Mood, MD on 08/12/2018   08/04/2018 Initial Biopsy   Diagnosis 08/04/18  Breast, right, needle core biopsy, 1.7 cm irregular solid mass at 9 o'clock, 4 cm fn - INVASIVE DUCTAL CARCINOMA WITH EXTRACELLULAR MUCIN.   08/04/2018 Receptors her2   Results: IMMUNOHISTOCHEMICAL AND MORPHOMETRIC ANALYSIS PERFORMED MANUALLY The tumor cells are NEGATIVE for Her2 (0). Estrogen Receptor: 100%, POSITIVE, STRONG STAINING INTENSITY Progesterone Receptor: 100%, POSITIVE, STRONG STAINING INTENSITY Proliferation Marker Ki67: 10%   08/11/2018  Initial Diagnosis   Malignant neoplasm of upper-outer quadrant of right breast in female, estrogen receptor positive (HCC)   09/02/2018 Surgery   RIGHT BREAST LUMPECTOMY WITH RADIOACTIVE SEED AND  RIGHT SENTINEL LYMPH NODE BIOPSY by Dr. Johna Sheriff  09/02/18    09/02/2018 Pathology Results   Diagnosis 1. Breast, lumpectomy, Right w/seed - INVASIVE DUCTAL CARCINOMA WITH EXTRACELLULAR MUCIN, GRADE I, 2.1 CM. - DUCTAL CARCINOMA IN SITU, INTERMEDIATE NUCLEAR GRADE. - INVASIVE CARCINOMA IS 2 MM FROM SUPERIOR MARGIN AND 2.5 MM FROM LATERAL MARGIN. - DUCTAL CARCINOMA IN SITU IS LESS THAN 1 MM FROM ANTERIOR MARGIN, 4 MM FROM SUPERIOR AND LATERAL MARGINS, 5 MM FROM INFERIOR MARGIN AND 8 MM FROM THE POSTERIOR MARGIN. - SMALL INTRADUCTAL PAPILLOMA. - NO EVIDENCE OF LYMPHOVASCULAR OR PERINEURAL INVASION. - BIOPSY SITE CHANGES. - SEE ONCOLOGY TABLE. 2. Lymph node, sentinel, biopsy, Right Axillary - LYMPH NODE, NEGATIVE FOR CARCINOMA (0/1). 3. Lymph node, sentinel, biopsy, Right - LYMPH NODE, NEGATIVE FOR CARCINOMA (0/1). 4. Lymph node, sentinel, biopsy, Right - METASTATIC BREAST CARCINOMA TO ONE LYMPH NODE (1/1). - METASTATIC FOCUS MEASURES 0.4 CM IN GREATEST DIMENSION. - NO DEFINITE EVIDENCE OF EXTRANODAL EXTENSION.   09/02/2018 Miscellaneous   MammaPrint  09/02/18 Low risk with 10% average 10-year risk of recurrnce untreated  Mammaprint Index: +0.287 97.8% of patients treated with hormonal therapy have no distnat recurrance.    09/02/2018 Cancer Staging   Staging form: Breast, AJCC 8th Edition - Pathologic stage from 09/02/2018: Stage IA (pT2, pN1a, cM0, G1, ER+, PR+, HER2-) - Signed by Malachy Mood, MD on 09/29/2018   10/05/2018 - 01/2019 Anti-estrogen oral therapy   Anastrozole 1mg  daily starting 10/05/18; stopped 5/18 due to insomnia, hot flashes, depression, and vaginal irritation.  I switched her Exemestane in 01/2019. She started having dizziness, hot flashes and joint pain so she stopped two days  later.  -She tried for no more than 3 weeks total   11/12/2018 - 12/30/2018 Radiation Therapy   RT 11/12/18-12/30/18 with Dr. Sharol Roussel    05/14/2019 Survivorship   SCP per Santiago Glad, NP    06/2019 - 06/2019 Anti-estrogen oral therapy   Tamoxifen 20mg  daily starting 06/2019 but stopped after a few days due to dizziness, nausea and fatigue.        REVIEW OF SYSTEMS:   Constitutional: Denies fevers, chills or abnormal weight loss Eyes: Denies blurriness of vision Ears, nose, mouth, throat, and face: Denies mucositis or sore throat Respiratory: Denies cough, dyspnea or wheezes Cardiovascular: Denies palpitation, chest discomfort or lower extremity swelling Gastrointestinal:  Denies nausea, heartburn or change in bowel habits Skin: Denies abnormal skin rashes Lymphatics: Denies  new lymphadenopathy or easy bruising Neurological:Denies numbness, tingling or new weaknesses Behavioral/Psych: Mood is stable, no new changes  All other systems were reviewed with the patient and are negative.   VITALS:   Today's Vitals   08/29/23 1149 08/29/23 1150  BP: (!) 156/78 (!) 160/78  Pulse: (!) 59   Resp: 17   Temp: 98.2 F (36.8 C)   TempSrc: Temporal   SpO2: 97%   Weight: 148 lb 6.4 oz (67.3 kg)   Height: 4\' 11"  (1.499 m)    Body mass index is 29.97 kg/m.   Wt Readings from Last 3 Encounters:  08/29/23 148 lb 6.4 oz (67.3 kg)  08/08/23 150 lb 12.8 oz (68.4 kg)  06/24/23 147 lb (66.7 kg)    Body mass index is 29.97 kg/m.  Performance status (ECOG): 0 - Asymptomatic  PHYSICAL EXAM:   GENERAL:alert, no distress and comfortable SKIN: skin color, texture, turgor are normal, no rashes or significant lesions EYES: normal, Conjunctiva are pink and non-injected, sclera clear OROPHARYNX:no exudate, no erythema and lips, buccal mucosa, and tongue normal  NECK: supple, thyroid normal size, non-tender, without nodularity LYMPH:  no palpable lymphadenopathy in the cervical, axillary or  inguinal LUNGS: clear to auscultation and percussion with normal breathing effort HEART: regular rate & rhythm and no murmurs and no lower extremity edema ABDOMEN:abdomen soft, non-tender and normal bowel sounds Musculoskeletal:no cyanosis of digits and no clubbing  NEURO: alert & oriented x 3 with fluent speech, no focal motor/sensory deficits BREAST: The right breast has well-healed lumpectomy scar along lateral aspect of the breast.  There is well-healed right mid axillary scar.  There are expected radiation changes to the right breast.  There are no new masses or lumps palpable in the right breast today.  There is no tenderness.  There is no nipple inversion or nipple discharge.  There is no axillary lymphadenopathy on the right.  The left breast is without palpable lumps or masses today.  There is no tenderness with palpation.  There is no nipple inversion or nipple discharge.  There is no axillary lymphadenopathy on the left.  LABORATORY DATA:  I have reviewed the data as listed    Component Value Date/Time   NA 142 08/29/2023 1111   NA 142 08/08/2023 1241   K 3.5 08/29/2023 1111   CL 108 08/29/2023 1111   CO2 27 08/29/2023 1111   GLUCOSE 100 (H) 08/29/2023 1111   BUN 14 08/29/2023 1111   BUN 12 08/08/2023 1241   CREATININE 0.87 08/29/2023 1111   CALCIUM 9.5 08/29/2023 1111   PROT 7.8 08/29/2023 1111   PROT 7.5 08/08/2023 1241   ALBUMIN 4.3 08/29/2023 1111   ALBUMIN 4.3 08/08/2023 1241   AST 43 (H) 08/29/2023 1111   ALT 47 (H) 08/29/2023 1111   ALKPHOS 90 08/29/2023 1111   BILITOT 0.4 08/29/2023 1111   GFRNONAA >60 08/29/2023 1111   GFRAA 85 05/26/2020 1232   GFRAA >60 08/12/2019 1035    Lab Results  Component Value Date   WBC 4.3 08/29/2023   NEUTROABS 2.5 08/29/2023   HGB 12.7 08/29/2023   HCT 39.2 08/29/2023   MCV 83.6 08/29/2023   PLT 202 08/29/2023

## 2023-08-28 NOTE — Assessment & Plan Note (Addendum)
 Stage IA, pT2N1aM0, ER/PR+, HER2-, Grade I, Mammaprint low risk  -Diagnosed in 08/2018. S/p right lumpectomy with SLNB on 09/02/18 adjuvant Radiation therapy.  -Mammaprint showed low risk, Adjuvant chemo was not recommended.  -Given her ER/PR+ disease, she tried antiestrogen therapy with Anastrozole, Exemestane and Tamoxifen over 3-4 months total. Given poor tolerance, she stopped and proceeded with surveillance as of 06/2019.  -Last mammogram 10/2021 was benign -Mariah Burns is clinically doing well from a breast cancer standpoint.  Breast exam is benign, labs are unremarkable.  Overall no clinical concern for recurrence. -She is 4 years from definitive treatment, recurrence risk has decreased.  Continue surveillance -Most recent screening mammogram done 10/09/2022 at [facility].  She had benign results.  Due for next screening mammogram May, 2025. -Follow-up with me in 1 year, or sooner if needed

## 2023-08-29 ENCOUNTER — Inpatient Hospital Stay: Payer: Medicare PPO | Attending: Nurse Practitioner

## 2023-08-29 ENCOUNTER — Telehealth: Payer: Self-pay | Admitting: Nurse Practitioner

## 2023-08-29 ENCOUNTER — Inpatient Hospital Stay: Payer: Medicare PPO | Admitting: Nurse Practitioner

## 2023-08-29 VITALS — BP 160/78 | HR 59 | Temp 98.2°F | Resp 17 | Ht 59.0 in | Wt 148.4 lb

## 2023-08-29 DIAGNOSIS — Z79811 Long term (current) use of aromatase inhibitors: Secondary | ICD-10-CM | POA: Insufficient documentation

## 2023-08-29 DIAGNOSIS — Z170 Estrogen receptor positive status [ER+]: Secondary | ICD-10-CM | POA: Insufficient documentation

## 2023-08-29 DIAGNOSIS — C50411 Malignant neoplasm of upper-outer quadrant of right female breast: Secondary | ICD-10-CM | POA: Diagnosis not present

## 2023-08-29 DIAGNOSIS — Z1721 Progesterone receptor positive status: Secondary | ICD-10-CM | POA: Diagnosis not present

## 2023-08-29 DIAGNOSIS — Z1732 Human epidermal growth factor receptor 2 negative status: Secondary | ICD-10-CM | POA: Diagnosis not present

## 2023-08-29 DIAGNOSIS — Z923 Personal history of irradiation: Secondary | ICD-10-CM | POA: Diagnosis not present

## 2023-08-29 LAB — CBC WITH DIFFERENTIAL (CANCER CENTER ONLY)
Abs Immature Granulocytes: 0.01 10*3/uL (ref 0.00–0.07)
Basophils Absolute: 0.1 10*3/uL (ref 0.0–0.1)
Basophils Relative: 1 %
Eosinophils Absolute: 0.2 10*3/uL (ref 0.0–0.5)
Eosinophils Relative: 4 %
HCT: 39.2 % (ref 36.0–46.0)
Hemoglobin: 12.7 g/dL (ref 12.0–15.0)
Immature Granulocytes: 0 %
Lymphocytes Relative: 27 %
Lymphs Abs: 1.2 10*3/uL (ref 0.7–4.0)
MCH: 27.1 pg (ref 26.0–34.0)
MCHC: 32.4 g/dL (ref 30.0–36.0)
MCV: 83.6 fL (ref 80.0–100.0)
Monocytes Absolute: 0.4 10*3/uL (ref 0.1–1.0)
Monocytes Relative: 10 %
Neutro Abs: 2.5 10*3/uL (ref 1.7–7.7)
Neutrophils Relative %: 58 %
Platelet Count: 202 10*3/uL (ref 150–400)
RBC: 4.69 MIL/uL (ref 3.87–5.11)
RDW: 15.3 % (ref 11.5–15.5)
WBC Count: 4.3 10*3/uL (ref 4.0–10.5)
nRBC: 0 % (ref 0.0–0.2)

## 2023-08-29 LAB — CMP (CANCER CENTER ONLY)
ALT: 47 U/L — ABNORMAL HIGH (ref 0–44)
AST: 43 U/L — ABNORMAL HIGH (ref 15–41)
Albumin: 4.3 g/dL (ref 3.5–5.0)
Alkaline Phosphatase: 90 U/L (ref 38–126)
Anion gap: 7 (ref 5–15)
BUN: 14 mg/dL (ref 8–23)
CO2: 27 mmol/L (ref 22–32)
Calcium: 9.5 mg/dL (ref 8.9–10.3)
Chloride: 108 mmol/L (ref 98–111)
Creatinine: 0.87 mg/dL (ref 0.44–1.00)
GFR, Estimated: >60 mL/min (ref >60)
Glucose, Bld: 100 mg/dL — ABNORMAL HIGH (ref 70–99)
Potassium: 3.5 mmol/L (ref 3.5–5.1)
Sodium: 142 mmol/L (ref 135–145)
Total Bilirubin: 0.4 mg/dL (ref 0.0–1.2)
Total Protein: 7.8 g/dL (ref 6.5–8.1)

## 2023-08-29 NOTE — Telephone Encounter (Signed)
 Left Message - Left a detailed message of appointment details. Informed patient to call back if appointments do not work.

## 2023-08-30 ENCOUNTER — Encounter: Payer: Self-pay | Admitting: Nurse Practitioner

## 2023-09-01 ENCOUNTER — Encounter: Payer: Self-pay | Admitting: Nurse Practitioner

## 2023-09-03 ENCOUNTER — Other Ambulatory Visit: Payer: Self-pay

## 2023-09-06 DIAGNOSIS — G4733 Obstructive sleep apnea (adult) (pediatric): Secondary | ICD-10-CM | POA: Diagnosis not present

## 2023-09-18 DIAGNOSIS — H35033 Hypertensive retinopathy, bilateral: Secondary | ICD-10-CM | POA: Diagnosis not present

## 2023-09-18 DIAGNOSIS — H16223 Keratoconjunctivitis sicca, not specified as Sjogren's, bilateral: Secondary | ICD-10-CM | POA: Diagnosis not present

## 2023-09-18 DIAGNOSIS — H2513 Age-related nuclear cataract, bilateral: Secondary | ICD-10-CM | POA: Diagnosis not present

## 2023-09-18 DIAGNOSIS — E119 Type 2 diabetes mellitus without complications: Secondary | ICD-10-CM | POA: Diagnosis not present

## 2023-09-18 LAB — HM DIABETES EYE EXAM

## 2023-09-23 DIAGNOSIS — G4733 Obstructive sleep apnea (adult) (pediatric): Secondary | ICD-10-CM | POA: Diagnosis not present

## 2023-09-27 ENCOUNTER — Encounter: Payer: Self-pay | Admitting: Physician Assistant

## 2023-10-23 DIAGNOSIS — G4733 Obstructive sleep apnea (adult) (pediatric): Secondary | ICD-10-CM | POA: Diagnosis not present

## 2023-11-07 DIAGNOSIS — Z1231 Encounter for screening mammogram for malignant neoplasm of breast: Secondary | ICD-10-CM | POA: Diagnosis not present

## 2023-11-07 LAB — HM MAMMOGRAPHY

## 2023-11-12 ENCOUNTER — Encounter: Payer: Self-pay | Admitting: Internal Medicine

## 2023-11-16 ENCOUNTER — Other Ambulatory Visit: Payer: Self-pay | Admitting: Internal Medicine

## 2023-11-23 DIAGNOSIS — G4733 Obstructive sleep apnea (adult) (pediatric): Secondary | ICD-10-CM | POA: Diagnosis not present

## 2023-11-26 ENCOUNTER — Telehealth: Payer: Self-pay | Admitting: Physician Assistant

## 2023-11-26 ENCOUNTER — Ambulatory Visit: Admitting: Physician Assistant

## 2023-11-26 NOTE — Telephone Encounter (Signed)
 Called and spoke to daughter Mariah Burns regarding her concerns about Mariah Burns, her mothers memory care provider. Mariah Burns stated that Mariah is rude and she feels that she just does not care about her mother Mariah Burns. Mariah Burns did note that she is pleased with Mariah BRAVO., the Mariah Burns that works along side of Mariah ORN. She also feels that the office staff is friendly however she wants to transfer her mother's care. I apologized for her experience here as we genuinely care about her mother.  I advised her to reach back out to her mother's PCP to have her mother referred elsewhere. Mariah Burns stated that she will reach out to Mariah Burns for his recommendations. Mariah Burns asked if we had any other resources or support that we could provide in the meantime. I informed her that we recently loss our social worker therefore at this time I was not able to offer her any additional options.However, if I heard of anything after we spoke that I would call her back and let her know. Mariah Burns verbalized understanding and the call ended cordially.

## 2023-12-10 DIAGNOSIS — G4733 Obstructive sleep apnea (adult) (pediatric): Secondary | ICD-10-CM | POA: Diagnosis not present

## 2023-12-15 ENCOUNTER — Other Ambulatory Visit: Payer: Self-pay | Admitting: Internal Medicine

## 2023-12-16 ENCOUNTER — Encounter: Payer: Self-pay | Admitting: Internal Medicine

## 2023-12-23 ENCOUNTER — Ambulatory Visit: Payer: Medicare PPO | Admitting: Physician Assistant

## 2023-12-23 DIAGNOSIS — G4733 Obstructive sleep apnea (adult) (pediatric): Secondary | ICD-10-CM | POA: Diagnosis not present

## 2023-12-24 ENCOUNTER — Other Ambulatory Visit: Payer: Self-pay | Admitting: Internal Medicine

## 2023-12-24 DIAGNOSIS — G309 Alzheimer's disease, unspecified: Secondary | ICD-10-CM

## 2023-12-25 ENCOUNTER — Encounter: Payer: Self-pay | Admitting: Internal Medicine

## 2023-12-25 ENCOUNTER — Ambulatory Visit: Admitting: Internal Medicine

## 2023-12-25 ENCOUNTER — Ambulatory Visit: Payer: Self-pay | Admitting: Internal Medicine

## 2023-12-25 ENCOUNTER — Ambulatory Visit

## 2023-12-25 VITALS — BP 126/62 | HR 57 | Temp 98.5°F | Ht 59.0 in | Wt 154.0 lb

## 2023-12-25 DIAGNOSIS — I10 Essential (primary) hypertension: Secondary | ICD-10-CM

## 2023-12-25 DIAGNOSIS — I130 Hypertensive heart and chronic kidney disease with heart failure and stage 1 through stage 4 chronic kidney disease, or unspecified chronic kidney disease: Secondary | ICD-10-CM

## 2023-12-25 DIAGNOSIS — E1122 Type 2 diabetes mellitus with diabetic chronic kidney disease: Secondary | ICD-10-CM

## 2023-12-25 DIAGNOSIS — J42 Unspecified chronic bronchitis: Secondary | ICD-10-CM | POA: Insufficient documentation

## 2023-12-25 DIAGNOSIS — E7800 Pure hypercholesterolemia, unspecified: Secondary | ICD-10-CM

## 2023-12-25 DIAGNOSIS — H9193 Unspecified hearing loss, bilateral: Secondary | ICD-10-CM

## 2023-12-25 DIAGNOSIS — I5032 Chronic diastolic (congestive) heart failure: Secondary | ICD-10-CM | POA: Diagnosis not present

## 2023-12-25 DIAGNOSIS — I129 Hypertensive chronic kidney disease with stage 1 through stage 4 chronic kidney disease, or unspecified chronic kidney disease: Secondary | ICD-10-CM

## 2023-12-25 DIAGNOSIS — F02A0 Dementia in other diseases classified elsewhere, mild, without behavioral disturbance, psychotic disturbance, mood disturbance, and anxiety: Secondary | ICD-10-CM | POA: Diagnosis not present

## 2023-12-25 DIAGNOSIS — Z0000 Encounter for general adult medical examination without abnormal findings: Secondary | ICD-10-CM | POA: Diagnosis not present

## 2023-12-25 DIAGNOSIS — G309 Alzheimer's disease, unspecified: Secondary | ICD-10-CM

## 2023-12-25 DIAGNOSIS — N182 Chronic kidney disease, stage 2 (mild): Secondary | ICD-10-CM | POA: Diagnosis not present

## 2023-12-25 MED ORDER — AMLODIPINE BESYLATE 10 MG PO TABS: 10.0000 mg | ORAL_TABLET | Freq: Every day | ORAL | 2 refills | Status: DC

## 2023-12-25 MED ORDER — ATORVASTATIN CALCIUM 40 MG PO TABS: ORAL_TABLET | ORAL | 2 refills | Status: DC

## 2023-12-25 MED ORDER — EMPAGLIFLOZIN 10 MG PO TABS: 10.0000 mg | ORAL_TABLET | Freq: Every day | ORAL | 2 refills | Status: AC

## 2023-12-25 MED ORDER — MEMANTINE HCL 5 MG PO TABS: ORAL_TABLET | ORAL | 1 refills | Status: DC

## 2023-12-25 NOTE — Progress Notes (Signed)
 Subjective:   Mariah Burns is a 78 y.o. who presents for a Medicare Wellness preventive visit.  As a reminder, Annual Wellness Visits don't include a physical exam, and some assessments may be limited, especially if this visit is performed virtually. We may recommend an in-person follow-up visit with your provider if needed.  Visit Complete: In person    Persons Participating in Visit: Patient assisted by daughter.  AWV Questionnaire: No: Patient Medicare AWV questionnaire was not completed prior to this visit.  Cardiac Risk Factors include: advanced age (>54men, >69 women);diabetes mellitus;dyslipidemia;hypertension     Objective:    Today's Vitals   12/25/23 0906  BP: 126/62  Pulse: (!) 57  Temp: 98.5 F (36.9 C)  TempSrc: Oral  SpO2: 96%  Weight: 154 lb (69.9 kg)  Height: 4' 11 (1.499 m)   Body mass index is 31.1 kg/m.     12/25/2023    9:17 AM 06/24/2023    3:15 PM 04/03/2023    6:17 PM 02/21/2023    2:33 PM 12/19/2022    9:49 AM 10/24/2022    9:52 AM 12/06/2021    9:47 AM  Advanced Directives  Does Patient Have a Medical Advance Directive? Yes Yes Yes Yes Yes Yes Yes  Type of Estate agent of [place];Living will Healthcare Power of [place];Living will Healthcare Power of [employer] Power of [employer] Power of [place];Living will Healthcare Power of [employer] of [place];Living will  Does patient want to make changes to medical advance directive?   No - Patient declined No - Patient declined  No - Patient declined   Copy of Healthcare Power of Attorney in Chart? No - copy requested  No - copy requested No - copy requested No - copy requested No - copy requested No - copy requested    Current Medications (verified) Outpatient Encounter Medications as of 12/25/2023  Medication Sig   amLODipine  (NORVASC ) 10 MG tablet Take 1 tablet (10 mg total) by mouth daily.   ascorbic acid (VITAMIN C) 500 MG tablet  Take 500 mg by mouth daily.   aspirin  EC 81 MG tablet Take 81 mg by mouth daily. Swallow whole.   atorvastatin  (LIPITOR) 40 MG tablet TAKE 1 TABLET BY MOUTH DAILY MONDAY THROUGH FRIDAY. SKIP WEEKENDS   Cholecalciferol (VITAMIN D -3 PO) Take 5,000 Units by mouth daily.   empagliflozin  (JARDIANCE ) 10 MG TABS tablet Take 1 tablet (10 mg total) by mouth daily before breakfast.   ezetimibe  (ZETIA ) 10 MG tablet TAKE 1 TABLET BY MOUTH EVERY DAY   Fluocinolone  Acetonide Scalp 0.01 % OIL Apply to affected area of scalp as directed 1-2 x/day as needed for itching   memantine  (NAMENDA ) 5 MG tablet TAKE 1 TABLET (5 MG AT NIGHT) FOR 2 WEEKS, THEN INCREASE TO 1 TABLET (5 MG) TWICE A DAY   Menthol, Topical Analgesic, (BIOFREEZE ROLL-ON EX) Apply 1 application. topically as needed (pain in back,neck,thumb).   nitroGLYCERIN  (NITROSTAT ) 0.4 MG SL tablet Place 1 tablet (0.4 mg total) under the tongue every 5 (five) minutes as needed for chest pain.   Polyvinyl Alcohol-Povidone PF 1.4-0.6 % SOLN Place 1 drop into both eyes daily as needed (dry eyes).    RESTASIS 0.05 % ophthalmic emulsion as needed (dry eyes).   valsartan  (DIOVAN ) 40 MG tablet TAKE 1 TABLET BY MOUTH EVERY DAY   nystatin (MYCOSTATIN/NYSTOP) powder as needed (skin irritation). (Patient not taking: Reported on 12/25/2023)   TART CHERRY PO daily at 6 (six) AM. (  Patient not taking: Reported on 12/25/2023)   valsartan  (DIOVAN ) 80 MG tablet Take 1 tablet (80 mg total) by mouth daily. (Patient not taking: Reported on 12/25/2023)   Facility-Administered Encounter Medications as of 12/25/2023  Medication   0.9 %  sodium chloride  infusion    Allergies (verified) Fosamax [alendronate sodium], Omeprazole, Motrin [ibuprofen], and Fluvastatin   History: Past Medical History:  Diagnosis Date   Abdominal bloating 12/14/2022   Arthritis    Chronic bilateral low back pain without sciatica 10/28/2019   Chronic diastolic heart failure 06/06/2021   Class 1 obesity  due to excess calories with serious comorbidity and body mass index (BMI) of 30.0 to 30.9 in adult 10/28/2019   Cyst of right breast 01/30/2017   1 cm     Dry mouth 11/19/2020   She is encouraged to increase fluid intake and to consider use of Biotene products. Also advised to increase her intake of healthy fats - walnuts, avocado etc.      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 Daughter verbally acknowledges  treatment plan.     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 We discussed the use of Trintellix, she declines at  this time.      Stage 2 chronic kidney disease 05/15/2018   Type 2 diabetes mellitus, without long-term current use of insulin 05/06/2010   Vitamin D  deficiency 10/28/2019   Past Surgical History:  Procedure Laterality Date   BREAST LUMPECTOMY WITH RADIOACTIVE SEED AND SENTINEL LYMPH NODE BIOPSY Right 09/02/2018   Procedure: RIGHT BREAST LUMPECTOMY WITH RADIOACTIVE SEED AND  RIGHT SENTINEL LYMPH NODE BIOPSY;  Surgeon: Mikell Katz, MD;  Location: [facility];  Service: General;  Laterality: Right;   BRONCHOSCOPY     dx'd sarcoid   KNEE ARTHROPLASTY Left 03/13/2017   NEUROPLASTY / TRANSPOSITION ULNAR NERVE AT ELBOW  09/2011   TOTAL ABDOMINAL HYSTERECTOMY     Family History  Problem Relation Age of Onset   Allergies Mother    Heart failure Father    Allergies Sister  Heart disease Sister    Lupus Sister    Dementia Sister    Heart disease Sister    Gout Brother    Hypertension Brother    Heart disease Brother    Gout Brother    Hypertension Brother    Hypertension Brother    Hypertension Brother    Post-traumatic stress disorder Brother    Allergies Daughter    Healthy Daughter    Healthy Daughter    Social History   Socioeconomic History   Marital status: Divorced    Spouse name: Not on file   Number of children: 3   Years of education: 16   Highest education level: Bachelor's degree (e.g., BA, AB, BS)  Occupational History   Occupation: Retired    Associate Professor: RETIRED  Tobacco Use   Smoking status: Never    Passive exposure: Never   Smokeless tobacco: Never  Vaping Use   Vaping status: Never Used  Substance and Sexual Activity   Alcohol use: No   Drug use: No   Sexual activity: Not Currently  Other Topics Concern   Not on file  Social History Narrative   05/23/21 Patient lives at home with youngest daughter.   Caffeine Use: none   Right handed   One story home   retired   Chief Executive Officer Drivers of [employer]: Low  Risk  (12/25/2023)   Overall Financial Resource Strain (CARDIA)    Difficulty of Paying Living Expenses: Not hard at all  Food Insecurity: No Food Insecurity (12/25/2023)   Hunger Vital Sign    Worried About Running Out of Food in the Last Year: Never true    Ran Out of Food in the Last Year: Never true  Transportation Needs: No Transportation Needs (12/25/2023)   PRAPARE - Administrator, Civil Service (Medical): No    Lack of Transportation (Non-Medical): No  Physical Activity: Insufficiently Active (12/25/2023)   Exercise Vital Sign    Days of Exercise per Week: 7 days    Minutes of Exercise per Session: 20 min  Stress: No Stress Concern Present (12/25/2023)   [employer] of Occupational Health - Occupational Stress Questionnaire    Feeling of Stress: Not at all  Social Connections: Moderately Isolated (12/25/2023)   Social Connection and Isolation Panel    Frequency of Communication with Friends and Family: More than three times a week    Frequency of Social Gatherings with Friends and Family: More than three times a week    Attends Religious Services: More than 4 times per year    Active Member of [employer] or Organizations: No    Attends Engineer, structural: Never    Marital Status: Divorced    Tobacco Counseling Counseling given: Not Answered    Clinical Intake:  Pre-visit preparation completed: Yes  Pain : No/denies pain     Nutritional Status: BMI > 30  Obese Nutritional Risks: None Diabetes: Yes CBG done?: No Did pt. bring in CBG monitor from home?: No  Lab Results  Component Value Date   HGBA1C 6.3 (H) 08/08/2023   HGBA1C 6.3 (H) 03/21/2023   HGBA1C 5.3 12/19/2022     How often do you need to have someone help you when you read instructions, pamphlets, or other written materials from your doctor or pharmacy?: 1 - Never  Interpreter Needed?: No  Information entered by :: NAllen LPN   Activities of Daily Living     12/25/2023  9:08 AM  In your present state of health, do you have any difficulty performing the following activities:  Hearing? 0  Vision? 0  Difficulty concentrating or making decisions? 1  Walking or climbing stairs? 0  Dressing or bathing? 0  Doing errands, shopping? 1  Preparing Food and eating ? N  Using the Toilet? N  In the past six months, have you accidently leaked urine? N  Do you have problems with loss of bowel control? N  Managing your Medications? Y  Managing your Finances? N  Housekeeping or managing your Housekeeping? N    Patient Care Team: Jarold Medici, MD as PCP - General (Internal Medicine) Santo Stanly LABOR, MD as PCP - Cardiology (Cardiology) Glean Stephane BROCKS, RN (Inactive) as Oncology Nurse Navigator Tyree Nanetta SAILOR, RN as Oncology Nurse Navigator Mikell Katz, MD (Inactive) as Consulting Physician (General Surgery) Lanny Callander, MD as Consulting Physician (Hematology) Shannon Agent, MD as Consulting Physician (Radiation Oncology) Burton, Lacie K, NP as Nurse Practitioner (Nurse Practitioner) Sheffield, Kelli R, PA-C as Physician Assistant (Dermatology) Cyrus Carwin, MD as Consulting Physician (Ophthalmology) Wertman, Sara E, PA-C (Neurology)  I have updated your Care Teams any recent Medical Services you may have received from other providers in the past year.     Assessment:   This is a routine wellness examination for [facility].  Hearing/Vision screen Hearing Screening - Comments:: Denies hearing issues Vision Screening - Comments:: Regular eye exams, Dr. Cyrus   Goals Addressed             This Visit's Progress    Patient Stated       12/25/2023, stay alive       Depression Screen     12/25/2023    9:18 AM 03/21/2023   11:07 AM 12/19/2022    9:51 AM 09/24/2022    4:25 PM 01/31/2022   10:08 AM 12/06/2021    9:48 AM 10/05/2021   11:58 AM  PHQ 2/9 Scores  PHQ - 2 Score 0 1 0 4 0 0 0  PHQ- 9 Score 0 4 3 18  0  0    Fall Risk      12/25/2023    9:17 AM 08/08/2023   11:32 AM 06/24/2023    3:13 PM 03/21/2023   11:07 AM 02/21/2023    2:33 PM  Fall Risk   Falls in the past year? 0 0 0 0 0  Number falls in past yr: 0 0 0 0 0  Injury with Fall? 0 0 0 0 0  Risk for fall due to : Medication side effect No Fall Risks  No Fall Risks   Follow up Falls evaluation completed;Falls prevention discussed Falls evaluation completed Falls evaluation completed Falls evaluation completed Falls evaluation completed    MEDICARE RISK AT HOME:  Medicare Risk at Home Any stairs in or around the home?: No If so, are there any without handrails?: No Home free of loose throw rugs in walkways, pet beds, electrical cords, etc?: Yes Adequate lighting in your home to reduce risk of falls?: Yes Life alert?: No Use of a cane, walker or w/c?: No Grab bars in the bathroom?: Yes Shower chair or bench in shower?: Yes Elevated toilet seat or a handicapped toilet?: No  TIMED UP AND GO:  Was the test performed?  Yes  Length of time to ambulate 10 feet: 5 sec Gait steady and fast without use of assistive device  Cognitive Function: Impaired: Patient has current diagnosis of cognitive impairment.  05/23/2021   10:21 AM  MMSE - Mini Mental State Exam  Orientation to time 5  Orientation to Place 4  Registration 3  Attention/ Calculation 3  Recall 1  Language- name 2 objects 2  Language- repeat 0  Language- follow 3 step command 3  Language- read & follow direction 1  Write a sentence 1  Copy design 1  Total score 24      10/24/2022    9:00 AM  Montreal Cognitive Assessment   Visuospatial/ Executive (0/5) 4  Naming (0/3) 3  Attention: Read list of digits (0/2) 2  Attention: Read list of letters (0/1) 1  Attention: Serial 7 subtraction starting at 100 (0/3) 0  Language: Repeat phrase (0/2) 2  Language : Fluency (0/1) 1  Abstraction (0/2) 2  Delayed Recall (0/5) 1  Orientation (0/6) 4  Total 20  Adjusted Score (based on education)  20      09/24/2022    4:58 PM 12/06/2021    9:53 AM 11/09/2020   12:48 PM 10/28/2019   10:32 AM 10/23/2018   11:51 AM  6CIT Screen  What Year? 0 points 0 points 0 points 0 points 0 points  What month? 0 points 0 points 0 points 0 points 0 points  What time? 3 points 3 points 0 points 0 points 0 points  Count back from 20 0 points 0 points 0 points 0 points 0 points  Months in reverse 0 points 0 points 0 points 0 points 0 points  Repeat phrase 8 points 2 points 2 points 0 points 0 points  Total Score 11 points 5 points 2 points 0 points 0 points    Immunizations Immunization History  Administered Date(s) Administered   Fluad Quad(high Dose 65+) 02/12/2019, 02/23/2020, 03/30/2021, 03/13/2022   Fluad Trivalent(High Dose 65+) 03/21/2023   Influenza Split 04/10/2011, 04/15/2012, 03/04/2013, 03/04/2014, 03/05/2015, 03/04/2017   Influenza Whole 06/13/2010   Influenza,inj,Quad PF,6+ Mos 03/22/2016   Influenza-Unspecified 03/22/2016, 03/04/2018   Moderna Covid-19 Fall Seasonal Vaccine 71yrs & older 05/23/2022   PFIZER(Purple Top)SARS-COV-2 Vaccination 08/09/2019, 09/08/2019, 05/18/2020   Pneumococcal Conjugate-13 11/13/2017   Pneumococcal Polysaccharide-23 06/04/2009, 12/28/2010, 06/11/2019   Td 05/31/2008   Tdap 12/13/2018   Unspecified SARS-COV-2 Vaccination 09/08/2019   Zoster Recombinant(Shingrix ) 10/05/2021, 01/31/2022    Screening Tests Health Maintenance  Topic Date Due   COVID-19 Vaccine (6 - 2024-25 season) 01/10/2024 (Originally 02/03/2023)   INFLUENZA VACCINE  01/03/2024   HEMOGLOBIN A1C  02/08/2024   FOOT EXAM  03/20/2024   Diabetic kidney evaluation - Urine ACR  08/07/2024   Diabetic kidney evaluation - eGFR measurement  08/28/2024   OPHTHALMOLOGY EXAM  09/17/2024   MAMMOGRAM  11/06/2024   Medicare Annual Wellness (AWV)  12/24/2024   DTaP/Tdap/Td (3 - Td or Tdap) 12/12/2028   Pneumococcal Vaccine: 50+ Years  Completed   DEXA SCAN  Completed   Hepatitis C Screening   Completed   Zoster Vaccines- Shingrix   Completed   Hepatitis B Vaccines  Aged Out   HPV VACCINES  Aged Out   Meningococcal B Vaccine  Aged Out   Colonoscopy  Discontinued    Health Maintenance  There are no preventive care reminders to display for this patient.  Health Maintenance Items Addressed: Declines covid vaccine.  Additional Screening:  Vision Screening: Recommended annual ophthalmology exams for early detection of glaucoma and other disorders of the eye. Would you like a referral to an eye doctor? No    Dental Screening: Recommended annual dental exams for  proper oral hygiene  Community Resource Referral / Chronic Care Management: CRR required this visit?  No   CCM required this visit?  No   Plan:    I have personally reviewed and noted the following in the patient's chart:   Medical and social history Use of alcohol, tobacco or illicit drugs  Current medications and supplements including opioid prescriptions. Patient is not currently taking opioid prescriptions. Functional ability and status Nutritional status Physical activity Advanced directives List of other physicians Hospitalizations, surgeries, and ER visits in previous 12 months Vitals Screenings to include cognitive, depression, and falls Referrals and appointments  In addition, I have reviewed and discussed with patient certain preventive protocols, quality metrics, and best practice recommendations. A written personalized care plan for preventive services as well as general preventive health recommendations were provided to patient.   Ardella FORBES Dawn, LPN   [id number]/76/[id number]   After Visit Summary: (In Person-Printed) AVS printed and given to the patient  Notes: Nothing significant to report at this time.

## 2023-12-25 NOTE — Progress Notes (Signed)
 I,[name], CMA,acting as a Neurosurgeon for Catheryn LOISE Slocumb, MD.,have documented all relevant documentation on the behalf of Catheryn LOISE Slocumb, MD,as directed by  Catheryn LOISE Slocumb, MD while in the presence of Catheryn LOISE Slocumb, MD.  Subjective:  Patient ID: Mariah Burns , female    DOB: [birth year] , 78 y.o.   MRN: [id number]  Chief Complaint  Patient presents with   Diabetes    Patient presents today for dm & bpc. She reports compliance with medications. Denies headache, chest pain & sob.  AWV completed with [facility] Advisor: Nikeah.    Hypertension    HPI Discussed the use of AI scribe software for clinical note transcription with the patient, who gave verbal consent to proceed.  She is accompanied by her daughter, Faylene today.   History of Present Illness Mariah Burns is a 78 year old female with diabetes and hypertension who presents for a diabetes and blood pressure check. She is accompanied by her oldest daughter, Feline.  She has not been monitoring her blood glucose levels recently but believes she has been adhering to a healthy diet. Her current medications include amlodipine  10 mg daily for hypertension, aspirin  81 mg daily, atorvastatin  taken Monday through Friday, Jardiance  10 mg daily, memantine  twice a day, and valsartan  40 mg daily. She is not tracking the cost of Jardiance  but continues to purchase it as prescribed.  Her blood pressure was noted to be elevated in January. She believes her blood pressure is better now because she has been eating right. She has experienced some ankle swelling.  She has a history of diastolic heart failure and last consulted her cardiologist in June of the previous year. Her last echocardiogram was in March of the previous year.  She is overdue for a mammogram, which was due in June, and typically undergoes this screening at [facility] on [address]. No issues with hearing, though she humorously mentions having 'selective  hearing'.   Diabetes She presents for her follow-up diabetic visit. She has type 2 diabetes mellitus. Her disease course has been stable. Pertinent negatives for diabetes include no blurred vision. There are no hypoglycemic complications. Diabetic complications include nephropathy. Risk factors for coronary artery disease include diabetes mellitus, dyslipidemia, hypertension, sedentary lifestyle and post-menopausal. She is compliant with treatment most of the time.  Hypertension This is a chronic problem. The current episode started more than 1 year ago. The problem has been gradually improving since onset. The problem is controlled. Pertinent negatives include no blurred vision.     Past Medical History:  Diagnosis Date   Abdominal bloating 12/14/2022   Arthritis    Chronic bilateral low back pain without sciatica 10/28/2019   Chronic diastolic heart failure 06/06/2021   Class 1 obesity due to excess calories with serious comorbidity and body mass index (BMI) of 30.0 to 30.9 in adult 10/28/2019   Cyst of right breast 01/30/2017   1 cm     Dry mouth 11/19/2020   She is encouraged to increase fluid intake and to consider use of Biotene products. Also advised to increase her intake of healthy fats - walnuts, avocado etc.      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 Daughter verbally acknowledges  treatment plan.     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 We discussed the use of Trintellix, she declines at this time.      Stage 2 chronic kidney disease 05/15/2018   Type 2 diabetes mellitus, without long-term current use of insulin 05/06/2010   Vitamin D  deficiency 10/28/2019     Family History  Problem Relation Age of Onset   Allergies Mother    Heart failure Father    Allergies Sister    Heart disease Sister    Lupus Sister    Dementia Sister    Heart disease Sister    Gout Brother    Hypertension Brother    Heart disease Brother    Gout Brother    Hypertension Brother    Hypertension Brother    Hypertension Brother    Post-traumatic stress disorder Brother    Allergies Daughter    Healthy Daughter    Healthy Daughter      Current Outpatient Medications:    amLODipine  (NORVASC ) 10 MG tablet, Take 1 tablet (10 mg total) by mouth daily., Disp: 90 tablet, Rfl: 2   ascorbic acid (VITAMIN C) 500 MG tablet, Take 500 mg by mouth daily., Disp: , Rfl:    aspirin  EC 81 MG tablet, Take 81 mg by mouth daily. Swallow whole., Disp: , Rfl:    atorvastatin  (LIPITOR) 40 MG tablet, TAKE 1 TABLET BY MOUTH  DAILY MONDAY THROUGH FRIDAY. SKIP WEEKENDS, Disp: 60 tablet, Rfl: 2   Cholecalciferol (VITAMIN D -3 PO), Take 5,000 Units by mouth daily., Disp: , Rfl:    empagliflozin  (JARDIANCE ) 10 MG TABS tablet, Take 1 tablet (10 mg total) by mouth daily before breakfast., Disp: 90 tablet, Rfl: 2   ezetimibe  (ZETIA ) 10 MG tablet, TAKE 1 TABLET BY MOUTH EVERY DAY, Disp: 90 tablet, Rfl: 1   Fluocinolone  Acetonide Scalp 0.01 % OIL, Apply to affected area of scalp as directed 1-2 x/day as needed for itching, Disp: 118.28 mL, Rfl: 1   memantine  (NAMENDA ) 5 MG tablet, Take 1 tablet (5 mg at night) for 2 weeks, then increase to 1 tablet (5 mg) twice a day, Disp: 180 tablet, Rfl: 1   Menthol, Topical Analgesic, (BIOFREEZE ROLL-ON EX), Apply 1 application. topically as needed (pain in back,neck,thumb)., Disp: , Rfl:  nitroGLYCERIN  (NITROSTAT ) 0.4 MG SL tablet, Place 1 tablet (0.4 mg total) under the tongue every 5 (five) minutes as needed for chest pain., Disp: 25 tablet, Rfl: 2   nystatin (MYCOSTATIN/NYSTOP) powder, as needed (skin irritation). (Patient not taking: Reported on 12/25/2023), Disp: , Rfl:    Polyvinyl Alcohol-Povidone PF 1.4-0.6 % SOLN, Place 1 drop into both eyes daily as needed (dry eyes). , Disp: , Rfl:    RESTASIS 0.05 % ophthalmic emulsion, as needed (dry eyes)., Disp: , Rfl:    valsartan  (DIOVAN ) 40 MG tablet, TAKE 1 TABLET BY MOUTH EVERY DAY, Disp: 90 tablet, Rfl: 1  Current Facility-Administered Medications:    0.9 %  sodium chloride  infusion, , Intravenous, PRN, Moore, Janece, FNP   Allergies  Allergen Reactions   Fosamax [Alendronate Sodium] Other (See Comments)    REACTION: CAUSE GI UPSET AND FATIGUE   Omeprazole Other (See Comments)    Lower ab pain   Motrin [Ibuprofen] Other (See Comments)    GI upset- has to eat prior to taking   Fluvastatin Other (See Comments)    mild memoryproblems of confusion      Review of Systems  Constitutional: Negative.   HENT: Negative.    Eyes:   Negative for blurred vision.  Respiratory: Negative.    Cardiovascular: Negative.   Gastrointestinal: Negative.   Neurological: Negative.   Psychiatric/Behavioral: Negative.       Today's Vitals   12/25/23 0910  BP: 126/62  Pulse: (!) 57  Temp: 98.5 F (36.9 C)  SpO2: 98%  Weight: 154 lb (69.9 kg)  Height: 4' 11 (1.499 m)   Body mass index is 31.1 kg/m.  Wt Readings from Last 3 Encounters:  12/25/23 154 lb (69.9 kg)  12/25/23 154 lb (69.9 kg)  08/29/23 148 lb 6.4 oz (67.3 kg)     Objective:  Physical Exam Vitals and nursing note reviewed.  Constitutional:      Appearance: Normal appearance.  HENT:     Head: Normocephalic and atraumatic.  Eyes:     Extraocular Movements: Extraocular movements intact.  Cardiovascular:     Rate and Rhythm: Normal rate and regular rhythm.     Heart sounds: Normal heart sounds.  Pulmonary:     Effort: Pulmonary effort is normal.     Breath sounds: Normal breath sounds.  Musculoskeletal:     Cervical back: Normal range of motion.  Skin:    General: Skin is warm.  Neurological:     General: No focal deficit present.     Mental Status: She is alert.  Psychiatric:        Mood and Affect: Mood normal.        Behavior: Behavior normal.         Assessment And Plan:  Type 2 diabetes mellitus with stage 2 chronic kidney disease, without long-term current use of insulin (HCC) Assessment & Plan: Chronic, diabetic foot exam was performed.  She is currently on Jardiance  10mg  daily. She is encouraged to stay well hydrated while on this medication. She will f/u in four months for re-evaluation.Blood glucose control to be evaluated with A1c test. Jardiance  10 mg prescribed with cost assistance available. - Order A1c test. - Refill Jardiance  10 mg with a 90-day supply. - Offer referral to pharmacist for cost assistance if Jardiance  is cost-prohibitive.  Orders: -     CMP14+EGFR -     Lipid panel -     Hemoglobin A1c  Hypertensive heart  and renal disease with renal  failure, stage 1 through stage 4 or unspecified chronic kidney disease, with heart failure (HCC) Assessment & Plan: Chronic, well controlled. She will continue amlodipine  10mg  daily and valsartan  40mg  daily.  She is encouraged to follow low sodium diet. She will f/u in four months for re-evaluation.   Orders: -     amLODIPine  Besylate; Take 1 tablet (10 mg total) by mouth daily.  Dispense: 90 tablet; Refill: 2  Chronic diastolic heart failure (HCC) Assessment & Plan: Diastolic heart failure with preserved ejection fraction. No current symptoms of exacerbation. Follow-up echocardiogram not needed until next year unless symptoms worsen. 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 She will c/w memantine  5mg  twice daily.  Signs of dementia present. Memantine  (Namenda ) essential to continue without interruption. - Refill memantine  (Namenda ) twice daily.  Orders: -     TSH -     Vitamin B12  Pure hypercholesterolemia Assessment & Plan: Chronic, she will continue with atorvastatin  40mg  daily.  She is encouraged to follow a heart healthy lifestyle.    Orders: -     Lipid panel -     Atorvastatin  Calcium ; TAKE 1 TABLET BY MOUTH DAILY MONDAY THROUGH FRIDAY. SKIP WEEKENDS  Dispense: 60 tablet; Refill: 2 -     TSH  Other orders -     Empagliflozin ; Take 1 tablet (10 mg total) by mouth daily before breakfast.  Dispense: 90 tablet; Refill: 2 -     Memantine  HCl; Take 1 tablet (5 mg at night) for 2 weeks, then increase to 1 tablet (5 mg) twice a day  Dispense: 180 tablet; Refill: 1   Return if symptoms worsen or fail to improve.  Patient was given opportunity to ask questions. Patient verbalized understanding of the  plan and was able to repeat key elements of the plan. All questions were answered to their satisfaction.   I, Catheryn LOISE Slocumb, MD, have reviewed all documentation for this visit. The documentation on 12/25/23 for the exam, diagnosis, procedures, and orders are all accurate and complete.   IF YOU HAVE BEEN REFERRED TO A SPECIALIST, IT MAY TAKE 1-2 WEEKS TO SCHEDULE/PROCESS THE REFERRAL. IF YOU HAVE NOT HEARD FROM US /SPECIALIST IN TWO WEEKS, PLEASE GIVE US  A CALL AT [phone].   THE PATIENT IS ENCOURAGED TO PRACTICE SOCIAL DISTANCING DUE TO THE COVID-19 PANDEMIC.

## 2023-12-25 NOTE — Patient Instructions (Signed)

## 2023-12-25 NOTE — Patient Instructions (Signed)
 Mariah Burns , Thank you for taking time out of your busy schedule to complete your Annual Wellness Visit with me. I enjoyed our conversation and look forward to speaking with you again next year. I, as well as your care team,  appreciate your ongoing commitment to your health goals. Please review the following plan we discussed and let me know if I can assist you in the future. Your Game plan/ To Do List    Referrals: If you haven't heard from the office you've been referred to, please reach out to them at the phone provided.   Follow up Visits: Next Medicare AWV with our clinical staff: office will schedule   Have you seen your provider in the last 6 months (3 months if uncontrolled diabetes)? Yes Next Office Visit with your provider: 12/25/2023 at 10:00  Clinician Recommendations:  Aim for 30 minutes of exercise or brisk walking, 6-8 glasses of water, and 5 servings of fruits and vegetables each day.       This is a list of the screening recommended for you and due dates:  Health Maintenance  Topic Date Due   COVID-19 Vaccine (6 - 2024-25 season) 01/10/2024*   Flu Shot  01/03/2024   Hemoglobin A1C  02/08/2024   Complete foot exam   03/20/2024   Yearly kidney health urinalysis for diabetes  08/07/2024   Yearly kidney function blood test for diabetes  08/28/2024   Eye exam for diabetics  09/17/2024   Mammogram  11/06/2024   Medicare Annual Wellness Visit  12/24/2024   DTaP/Tdap/Td vaccine (3 - Td or Tdap) 12/12/2028   Pneumococcal Vaccine for age over 40  Completed   DEXA scan (bone density measurement)  Completed   Hepatitis C Screening  Completed   Zoster (Shingles) Vaccine  Completed   Hepatitis B Vaccine  Aged Out   HPV Vaccine  Aged Out   Meningitis B Vaccine  Aged Out   Colon Cancer Screening  Discontinued  *Topic was postponed. The date shown is not the original due date.    Advanced directives: (Copy Requested) Please bring a copy of your health care power of attorney  and living will to the office to be added to your chart at your convenience. You can mail to [facility] [address] [place], [place] [address] or email to [email or web address] Advance Care Planning is important because it:  [x]  Makes sure you receive the medical care that is consistent with your values, goals, and preferences  [x]  It provides guidance to your family and loved ones and reduces their decisional burden about whether or not they are making the right decisions based on your wishes.  Follow the link provided in your after visit summary or read over the paperwork we have mailed to you to help you started getting your Advance Directives in place. If you need assistance in completing these, please reach out to us  so that we can help you!  See attachments for Preventive Care and Fall Prevention Tips.

## 2023-12-26 LAB — LIPID PANEL
Chol/HDL Ratio: 2.3 ratio (ref 0.0–4.4)
Cholesterol, Total: 115 mg/dL (ref 100–199)
HDL: 50 mg/dL (ref >39)
LDL Chol Calc (NIH): 52 mg/dL (ref 0–99)
Triglycerides: 60 mg/dL (ref 0–149)
VLDL Cholesterol Cal: 13 mg/dL (ref 5–40)

## 2023-12-26 LAB — CMP14+EGFR
ALT: 31 IU/L (ref 0–32)
AST: 30 IU/L (ref 0–40)
Albumin: 4.5 g/dL (ref 3.8–4.8)
Alkaline Phosphatase: 107 IU/L (ref 44–121)
BUN/Creatinine Ratio: 8 — ABNORMAL LOW (ref 12–28)
BUN: 8 mg/dL (ref 8–27)
Bilirubin Total: 0.5 mg/dL (ref 0.0–1.2)
CO2: 22 mmol/L (ref 20–29)
Calcium: 9.8 mg/dL (ref 8.7–10.3)
Chloride: 106 mmol/L (ref 96–106)
Creatinine, Ser: 0.99 mg/dL (ref 0.57–1.00)
Globulin, Total: 3.1 g/dL (ref 1.5–4.5)
Glucose: 94 mg/dL (ref 70–99)
Potassium: 3.9 mmol/L (ref 3.5–5.2)
Sodium: 143 mmol/L (ref 134–144)
Total Protein: 7.6 g/dL (ref 6.0–8.5)
eGFR: 58 mL/min/1.73 — ABNORMAL LOW (ref >59)

## 2023-12-26 LAB — HEMOGLOBIN A1C
Est. average glucose Bld gHb Est-mCnc: 131 mg/dL
Hgb A1c MFr Bld: 6.2 % — ABNORMAL HIGH (ref 4.8–5.6)

## 2023-12-26 LAB — TSH: TSH: 1.91 u[IU]/mL (ref 0.450–4.500)

## 2023-12-26 LAB — VITAMIN B12: Vitamin B-12: 766 pg/mL (ref 232–1245)

## 2023-12-30 ENCOUNTER — Ambulatory Visit: Payer: Self-pay | Admitting: Internal Medicine

## 2023-12-31 NOTE — Procedures (Signed)
Mask fit

## 2024-01-01 NOTE — Assessment & Plan Note (Signed)
 She has had Neuropsych eval, Neurology input also appreciated. She will c/w memantine  5mg  twice daily.  Signs of dementia present. Memantine  (Namenda ) essential to continue without interruption. - Refill memantine  (Namenda ) twice daily.

## 2024-01-01 NOTE — Assessment & Plan Note (Signed)
 Diastolic heart failure with preserved ejection fraction. No current symptoms of exacerbation. Follow-up echocardiogram not needed until next year unless symptoms worsen. - Follow low salt diet - Continue with Jardiance  10mg  daily

## 2024-01-01 NOTE — Assessment & Plan Note (Signed)
 Chronic, well controlled. She will continue amlodipine 10mg  daily and valsartan 40mg  daily.  She is encouraged to follow low sodium diet. She will f/u in four months for re-evaluation.

## 2024-01-01 NOTE — Assessment & Plan Note (Signed)
 Chronic, diabetic foot exam was performed.  She is currently on Jardiance  10mg  daily. She is encouraged to stay well hydrated while on this medication. She will f/u in four months for re-evaluation.Blood glucose control to be evaluated with A1c test. Jardiance  10 mg prescribed with cost assistance available. - Order A1c test. - Refill Jardiance  10 mg with a 90-day supply. - Offer referral to pharmacist for cost assistance if Jardiance  is cost-prohibitive.

## 2024-01-01 NOTE — Assessment & Plan Note (Signed)
Chronic, she will continue with atorvastatin 40mg  daily.  She is encouraged to follow a heart healthy lifestyle.

## 2024-01-10 ENCOUNTER — Other Ambulatory Visit: Payer: Self-pay

## 2024-01-10 ENCOUNTER — Other Ambulatory Visit (HOSPITAL_COMMUNITY): Payer: Self-pay

## 2024-01-10 MED ORDER — EZETIMIBE 10 MG PO TABS
10.0000 mg | ORAL_TABLET | Freq: Every day | ORAL | 0 refills | Status: DC
Start: 2024-01-10 — End: 2024-01-15
  Filled 2024-01-10: qty 30, 30d supply, fill #0

## 2024-01-15 ENCOUNTER — Other Ambulatory Visit: Payer: Self-pay

## 2024-01-15 MED ORDER — EZETIMIBE 10 MG PO TABS: 10.0000 mg | ORAL_TABLET | Freq: Every day | ORAL | 0 refills | Status: DC

## 2024-01-23 DIAGNOSIS — G4733 Obstructive sleep apnea (adult) (pediatric): Secondary | ICD-10-CM | POA: Diagnosis not present

## 2024-01-24 ENCOUNTER — Ambulatory Visit: Admitting: Physician Assistant

## 2024-02-19 ENCOUNTER — Other Ambulatory Visit: Payer: Self-pay | Admitting: Internal Medicine

## 2024-02-19 DIAGNOSIS — I10 Essential (primary) hypertension: Secondary | ICD-10-CM

## 2024-02-23 DIAGNOSIS — G4733 Obstructive sleep apnea (adult) (pediatric): Secondary | ICD-10-CM | POA: Diagnosis not present

## 2024-02-27 NOTE — Progress Notes (Signed)
 Office Visit Note  Patient: Mariah Burns             Date of Birth: [birth year]           MRN: [id number]             PCP: Jarold Medici, MD Referring: Jarold Medici, MD Visit Date: 03/11/2024 Occupation: Data Unavailable  Subjective:  Pain in joints   History of Present Illness: Mariah Burns is a 78 y.o. female with pulmonary sarcoidosis, osteoarthritis and degenerative disc disease.  She returns today after her last visit in October 2024.  She had a chest x-ray in December 2024 which showed no radiographic progression.  She continues to have pain and stiffness in her bilateral hands.  She states she has been wearing copper gloves which helped.  She states her right shoulder joint pain is better and is only intermittent.  She states the left trochanteric bursa pain resolved.  She has been walking daily and feels stronger.  She states that her gait instability has improved.    Activities of Daily Living:  Patient reports morning stiffness for 0 minute.   Patient Reports nocturnal pain.  Difficulty dressing/grooming: Denies Difficulty climbing stairs: Denies Difficulty getting out of chair: Denies Difficulty using hands for taps, buttons, cutlery, and/or writing: Reports  Review of Systems  Constitutional:  Negative for fatigue.  HENT:  Negative for mouth sores and mouth dryness.   Eyes:  Positive for dryness.  Respiratory:  Negative for shortness of breath.   Cardiovascular:  Negative for chest pain and palpitations.  Gastrointestinal:  Negative for blood in stool, constipation and diarrhea.  Endocrine: Negative for increased urination.  Genitourinary:  Negative for involuntary urination.  Musculoskeletal:  Positive for joint pain, joint pain, myalgias and myalgias. Negative for gait problem, joint swelling, muscle weakness, morning stiffness and muscle tenderness.  Skin:  Negative for color change, rash, hair loss and sensitivity to sunlight.  Allergic/Immunologic:  Negative for susceptible to infections.  Neurological:  Negative for dizziness and headaches.  Hematological:  Negative for swollen glands.  Psychiatric/Behavioral:  Positive for sleep disturbance. Negative for depressed mood. The patient is not nervous/anxious.     PMFS History:  Patient Active Problem List   Diagnosis Date Noted   Chronic bronchitis, unspecified chronic bronchitis type (HCC) 12/25/2023   Hearing deficit, bilateral 08/17/2023   Mild dementia, concerns for Alzheimer's disease 06/11/2023   Postnasal drip 01/01/2023   Arthritis    Dysphagia    Gastroesophageal reflux disease    History of colonic polyps    Generalized anxiety disorder 02/23/2022   Chronic diastolic heart failure 06/06/2021   Insomnia 03/30/2021   Pure hypercholesterolemia 03/07/2021   Situational depression 11/19/2020   Dry mouth 11/19/2020   Class 1 obesity due to excess calories with serious comorbidity and body mass index (BMI) of 30.0 to 30.9 in adult 10/28/2019   Vitamin D  deficiency 10/28/2019   Chronic bilateral low back pain without sciatica 10/28/2019   Hip pain, bilateral 10/28/2019   Malignant neoplasm of upper-outer quadrant of right breast in female, estrogen receptor positive 08/11/2018   Stage 2 chronic kidney disease 05/15/2018   Hypertensive nephropathy 05/15/2018   Cyst of right breast 01/30/2017   Incomplete right bundle branch block 02/17/2015   Pericardial effusion 02/17/2015   Osteopenia 10/05/2011   Type 2 diabetes mellitus, without long-term current use of insulin 05/06/2010   Pulmonary sarcoidosis 03/21/2007   Hyperlipidemia 03/21/2007   Obstructive sleep apnea 03/21/2007  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 Also advised to increase her intake of healthy fats - walnuts, avocado etc.      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 Daughter verbally acknowledges  treatment plan.     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 We discussed the use of Trintellix, she declines at this time.       Stage 2 chronic kidney disease 05/15/2018   Type 2 diabetes mellitus, without long-term current use of insulin 05/06/2010   Vitamin D  deficiency 10/28/2019    Family History  Problem Relation Age of Onset   Allergies Mother    Heart failure Father    Allergies Sister    Heart disease Sister    Lupus Sister    Dementia Sister    Heart disease Sister    Gout Brother    Hypertension Brother    Heart disease Brother    Gout Brother    Hypertension Brother    Hypertension Brother    Hypertension Brother    Post-traumatic stress disorder Brother    Allergies Daughter    Healthy Daughter    Healthy Daughter    Past Surgical History:  Procedure Laterality Date   BREAST LUMPECTOMY WITH RADIOACTIVE SEED AND SENTINEL LYMPH NODE BIOPSY Right 09/02/2018   Procedure: RIGHT BREAST LUMPECTOMY WITH RADIOACTIVE SEED AND  RIGHT SENTINEL LYMPH NODE BIOPSY;  Surgeon: Mikell Katz, MD;  Location: [facility];  Service: General;  Laterality: Right;  BRONCHOSCOPY     dx'd sarcoid   KNEE ARTHROPLASTY Left 03/13/2017   NEUROPLASTY / TRANSPOSITION ULNAR NERVE AT ELBOW  09/2011   TOTAL ABDOMINAL HYSTERECTOMY     Social History   Tobacco Use   Smoking status: Never    Passive exposure: Never   Smokeless tobacco: Never  Vaping Use   Vaping status: Never Used  Substance Use Topics   Alcohol use: No   Drug use: No   Social History   Social History Narrative   05/23/21 Patient lives at home with youngest daughter.   Caffeine Use: none   Right handed   One story home   retired     Immunization History  Administered Date(s) Administered   Fluad Quad(high Dose 65+) 02/12/2019, 02/23/2020, 03/30/2021, 03/13/2022   Fluad Trivalent(High Dose 65+) 03/21/2023   Influenza Split 04/10/2011, 04/15/2012, 03/04/2013, 03/04/2014, 03/05/2015, 03/04/2017   Influenza Whole 06/13/2010   Influenza,inj,Quad PF,6+ Mos 03/22/2016   Influenza-Unspecified 03/22/2016, 03/04/2018    Moderna Covid-19 Fall Seasonal Vaccine 45yrs & older 05/23/2022   PFIZER(Purple Top)SARS-COV-2 Vaccination 08/09/2019, 09/08/2019, 05/18/2020   Pneumococcal Conjugate-13 11/13/2017   Pneumococcal Polysaccharide-23 06/04/2009, 12/28/2010, 06/11/2019   Td 05/31/2008   Tdap 12/13/2018   Unspecified SARS-COV-2 Vaccination 09/08/2019   Zoster Recombinant(Shingrix ) 10/05/2021, 01/31/2022     Objective: Vital Signs: BP 139/76   Pulse (!) 58   Temp 98.1 F (36.7 C)   Resp 14   Ht 4' 11 (1.499 m)   Wt 152 lb (68.9 kg)   BMI 30.70 kg/m    Physical Exam Vitals and nursing note reviewed.  Constitutional:      Appearance: She is well-developed.  HENT:     Head: Normocephalic and atraumatic.  Eyes:     Conjunctiva/sclera: Conjunctivae normal.  Cardiovascular:     Rate and Rhythm: Normal rate and regular rhythm.     Heart sounds: Normal heart sounds.  Pulmonary:     Effort: Pulmonary effort is normal.     Breath sounds: Normal breath sounds.  Abdominal:     General: Bowel sounds are normal.     Palpations: Abdomen is soft.  Musculoskeletal:     Cervical back: Normal range of motion.  Lymphadenopathy:     Cervical: No cervical adenopathy.  Skin:    General: Skin is warm and dry.     Capillary Refill: Capillary refill takes less than 2 seconds.  Neurological:     Mental Status: She is alert and oriented to person, place, and time.  Psychiatric:        Behavior: Behavior normal.      Musculoskeletal Exam: Cervical spine was in good range of motion.  She had no tenderness over thoracic or lumbar spine.  There was no SI joint tenderness.  Shoulder joints, elbow joints, wrist joints, MCPs, PIPs and DIPs were in good range of motion with no synovitis. DIP thickening with no synovitis was noted.  Hip joints and knee joints were in good range of motion without any warmth swelling or effusion.  There was no tenderness over ankles or MTPs.   CDAI Exam: CDAI Score: -- Patient Global:  --; Provider Global: -- Swollen: --; Tender: -- Joint Exam 03/11/2024   No joint exam has been documented for this visit   There is currently no information documented on the homunculus. Go to the Rheumatology activity and complete the homunculus joint exam.  Investigation: No additional findings.  Imaging: No results found.  Recent Labs: Lab Results  Component Value Date  WBC 4.3 08/29/2023   HGB 12.7 08/29/2023   PLT 202 08/29/2023   NA 143 12/25/2023   K 3.9 12/25/2023   CL 106 12/25/2023   CO2 22 12/25/2023   GLUCOSE 94 12/25/2023   BUN 8 12/25/2023   CREATININE 0.99 12/25/2023   BILITOT 0.5 12/25/2023   ALKPHOS 107 12/25/2023   AST 30 12/25/2023   ALT 31 12/25/2023   PROT 7.6 12/25/2023   ALBUMIN 4.5 12/25/2023   CALCIUM  9.8 12/25/2023   GFRAA 85 05/26/2020    Speciality Comments: No specialty comments available.  Procedures:  No procedures performed Allergies: Fosamax [alendronate sodium], Omeprazole, Motrin [ibuprofen], and Fluvastatin   Assessment / Plan:     Visit Diagnoses: Pulmonary sarcoidosis - +RF, +ANA: Patient denies any increased shortness of breath.  I reviewed chest x-ray from December 2024 which showed no radiographic progression.  She has been followed by Dr. Neysa.  Chronic right shoulder pain-shoulder joint pain improved after physical therapy.  She only has intermittent discomfort.  Primary osteoarthritis of both hands-she DIP thickening with no synovitis on the examination today.  She has been wearing copper gloves which helped.  A handout on hand exercises was given.  Trochanteric bursitis, left hip - X-ray was unremarkable in the past.  She denies discomfort today.  Primary osteoarthritis of both knees-no warmth swelling or effusion was noted on the examination today.  A handout on lower extremity exercises was given.  Gait instability-improved per patient.  DDD (degenerative disc disease), cervical-she good range of motion without  discomfort.  History of vitamin D  deficiency  Osteopenia of multiple sites - August 14, 2021 DEXA scan The BMD measured at Femur Neck Right is 0.795 g/cm2 with a score of -1.7.  Followed by Dr. Jarold.  Repeat DEXA scan is pending.  History of hypertension-blood pressure was normal today at 139/76.  History of hyperlipidemia  History of diabetes mellitus  History of sleep apnea - Uses CPAP machine  Malignant neoplasm of upper-outer quadrant of right breast in female, estrogen receptor positive (HCC) - 2020 s/p lumpectomy and RTX.  She is followed by oncology.  Orders: No orders of the defined types were placed in this encounter.  No orders of the defined types were placed in this encounter.    Follow-Up Instructions: Return in about 1 year (around 03/11/2025) for Osteoarthritis, Sarcoidosis.   Maya Nash, MD  Note - This record has been created using Animal nutritionist.  Chart creation errors have been sought, but may not always  have been located. Such creation errors do not reflect on  the standard of medical care.

## 2024-03-09 DIAGNOSIS — G4733 Obstructive sleep apnea (adult) (pediatric): Secondary | ICD-10-CM | POA: Diagnosis not present

## 2024-03-10 ENCOUNTER — Other Ambulatory Visit (HOSPITAL_BASED_OUTPATIENT_CLINIC_OR_DEPARTMENT_OTHER): Payer: Self-pay | Admitting: Obstetrics and Gynecology

## 2024-03-10 DIAGNOSIS — M8580 Other specified disorders of bone density and structure, unspecified site: Secondary | ICD-10-CM

## 2024-03-11 ENCOUNTER — Encounter: Payer: Self-pay | Admitting: Rheumatology

## 2024-03-11 ENCOUNTER — Telehealth: Payer: Self-pay | Admitting: Internal Medicine

## 2024-03-11 ENCOUNTER — Ambulatory Visit: Payer: Medicare PPO | Attending: Rheumatology | Admitting: Rheumatology

## 2024-03-11 ENCOUNTER — Other Ambulatory Visit (HOSPITAL_COMMUNITY): Payer: Self-pay

## 2024-03-11 ENCOUNTER — Other Ambulatory Visit: Payer: Self-pay | Admitting: Internal Medicine

## 2024-03-11 VITALS — BP 139/76 | HR 58 | Temp 98.1°F | Resp 14 | Ht 59.0 in | Wt 152.0 lb

## 2024-03-11 DIAGNOSIS — M5030 Other cervical disc degeneration, unspecified cervical region: Secondary | ICD-10-CM | POA: Diagnosis not present

## 2024-03-11 DIAGNOSIS — D860 Sarcoidosis of lung: Secondary | ICD-10-CM

## 2024-03-11 DIAGNOSIS — Z8679 Personal history of other diseases of the circulatory system: Secondary | ICD-10-CM | POA: Diagnosis not present

## 2024-03-11 DIAGNOSIS — M19041 Primary osteoarthritis, right hand: Secondary | ICD-10-CM

## 2024-03-11 DIAGNOSIS — Z170 Estrogen receptor positive status [ER+]: Secondary | ICD-10-CM

## 2024-03-11 DIAGNOSIS — M170 Bilateral primary osteoarthritis of knee: Secondary | ICD-10-CM

## 2024-03-11 DIAGNOSIS — Z8639 Personal history of other endocrine, nutritional and metabolic disease: Secondary | ICD-10-CM

## 2024-03-11 DIAGNOSIS — M7062 Trochanteric bursitis, left hip: Secondary | ICD-10-CM

## 2024-03-11 DIAGNOSIS — Z8669 Personal history of other diseases of the nervous system and sense organs: Secondary | ICD-10-CM

## 2024-03-11 DIAGNOSIS — M25511 Pain in right shoulder: Secondary | ICD-10-CM | POA: Diagnosis not present

## 2024-03-11 DIAGNOSIS — R2681 Unsteadiness on feet: Secondary | ICD-10-CM | POA: Diagnosis not present

## 2024-03-11 DIAGNOSIS — M19042 Primary osteoarthritis, left hand: Secondary | ICD-10-CM

## 2024-03-11 DIAGNOSIS — M8589 Other specified disorders of bone density and structure, multiple sites: Secondary | ICD-10-CM

## 2024-03-11 DIAGNOSIS — G8929 Other chronic pain: Secondary | ICD-10-CM

## 2024-03-11 DIAGNOSIS — C50411 Malignant neoplasm of upper-outer quadrant of right female breast: Secondary | ICD-10-CM

## 2024-03-11 MED ORDER — EZETIMIBE 10 MG PO TABS: 10.0000 mg | ORAL_TABLET | Freq: Every day | ORAL | 0 refills | Status: DC

## 2024-03-11 NOTE — Telephone Encounter (Signed)
*  STAT* If patient is at the pharmacy, call can be transferred to refill team.   1. Which medications need to be refilled? (please list name of each medication and dose if known) ezetimibe  (ZETIA ) 10 MG tablet  2. Which pharmacy/location (including street and city if local pharmacy) is medication to be sent to? [facility] [id number]  3. Do they need a 30 day or 90 day supply?  90 day supply

## 2024-03-11 NOTE — Telephone Encounter (Signed)
 RX sent in

## 2024-03-11 NOTE — Patient Instructions (Signed)
 Hand Exercises Hand exercises can be helpful for almost anyone. They can strengthen your hands and improve flexibility and movement. The exercises can also increase blood flow to the hands. These results can make your work and daily tasks easier for you. Hand exercises can be especially helpful for people who have joint pain from arthritis or nerve damage from using their hands over and over. These exercises can also help people who injure a hand. Exercises Most of these hand exercises are gentle stretching and motion exercises. It is usually safe to do them often throughout the day. Warming up your hands before exercise may help reduce stiffness. You can do this with gentle massage or by placing your hands in warm water for 10-15 minutes. It is normal to feel some stretching, pulling, tightness, or mild discomfort when you begin new exercises. In time, this will improve. Remember to always be careful and stop right away if you feel sudden, very bad pain or your pain gets worse. You want to get better and be safe. Ask your health care provider which exercises are safe for you. Do exercises exactly as told by your provider and adjust them as told. Do not begin these exercises until told by your provider. Knuckle bend or "claw" fist  Stand or sit with your arm, hand, and all five fingers pointed straight up. Make sure to keep your wrist straight. Gently bend your fingers down toward your palm until the tips of your fingers are touching your palm. Keep your big knuckle straight and only bend the small knuckles in your fingers. Hold this position for 10 seconds. Straighten your fingers back to your starting position. Repeat this exercise 5-10 times with each hand. Full finger fist  Stand or sit with your arm, hand, and all five fingers pointed straight up. Make sure to keep your wrist straight. Gently bend your fingers into your palm until the tips of your fingers are touching the middle of your  palm. Hold this position for 10 seconds. Extend your fingers back to your starting position, stretching every joint fully. Repeat this exercise 5-10 times with each hand. Straight fist  Stand or sit with your arm, hand, and all five fingers pointed straight up. Make sure to keep your wrist straight. Gently bend your fingers at the big knuckle, where your fingers meet your hand, and at the middle knuckle. Keep the knuckle at the tips of your fingers straight and try to touch the bottom of your palm. Hold this position for 10 seconds. Extend your fingers back to your starting position, stretching every joint fully. Repeat this exercise 5-10 times with each hand. Tabletop  Stand or sit with your arm, hand, and all five fingers pointed straight up. Make sure to keep your wrist straight. Gently bend your fingers at the big knuckle, where your fingers meet your hand, as far down as you can. Keep the small knuckles in your fingers straight. Think of forming a tabletop with your fingers. Hold this position for 10 seconds. Extend your fingers back to your starting position, stretching every joint fully. Repeat this exercise 5-10 times with each hand. Finger spread  Place your hand flat on a table with your palm facing down. Make sure your wrist stays straight. Spread your fingers and thumb apart from each other as far as you can until you feel a gentle stretch. Hold this position for 10 seconds. Bring your fingers and thumb tight together again. Hold this position for 10 seconds. Repeat  this exercise 5-10 times with each hand. Making circles  Stand or sit with your arm, hand, and all five fingers pointed straight up. Make sure to keep your wrist straight. Make a circle by touching the tip of your thumb to the tip of your index finger. Hold for 10 seconds. Then open your hand wide. Repeat this motion with your thumb and each of your fingers. Repeat this exercise 5-10 times with each hand. Thumb  motion  Sit with your forearm resting on a table and your wrist straight. Your thumb should be facing up toward the ceiling. Keep your fingers relaxed as you move your thumb. Lift your thumb up as high as you can toward the ceiling. Hold for 10 seconds. Bend your thumb across your palm as far as you can, reaching the tip of your thumb for the small finger (pinkie) side of your palm. Hold for 10 seconds. Repeat this exercise 5-10 times with each hand. Grip strengthening  Hold a stress ball or other soft ball in the middle of your hand. Slowly increase the pressure, squeezing the ball as much as you can without causing pain. Think of bringing the tips of your fingers into the middle of your palm. All of your finger joints should bend when doing this exercise. Hold your squeeze for 10 seconds, then relax. Repeat this exercise 5-10 times with each hand. Contact a health care provider if: Your hand pain or discomfort gets much worse when you do an exercise. Your hand pain or discomfort does not improve within 2 hours after you exercise. If you have either of these problems, stop doing these exercises right away. Do not do them again unless your provider says that you can. Get help right away if: You develop sudden, severe hand pain or swelling. If this happens, stop doing these exercises right away. Do not do them again unless your provider says that you can. This information is not intended to replace advice given to you by your health care provider. Make sure you discuss any questions you have with your health care provider. Document Revised: 06/05/2022 Document Reviewed: 06/05/2022 Elsevier Patient Education  2024 [employer] Exercises for Chronic Knee Pain Chronic knee pain is pain that lasts longer than 3 months. For most people with chronic knee pain, exercise and weight loss is an important part of treatment. Your health care provider may want you to focus on: Making the muscles that  support your knee stronger. This can take pressure off your knee and reduce pain. Preventing knee stiffness. How far you can move your knee, keeping it there or making it farther. Losing weight (if this applies) to take pressure off your knee, lower your risk for injury, and make it easier for you to exercise. Your provider will help you make an exercise program that fits your needs and physical abilities. Below are simple, low-impact exercises you can do at home. Ask your provider or physical therapist how often you should do your exercise program and how many times to repeat each exercise. General safety tips  Get your provider's approval before doing any exercises. Start slowly and stop any time you feel pain. Do not exercise if your knee pain is flaring up. Warm up first. Stretching a cold muscle can cause an injury. Do 5-10 minutes of easy movement or light stretching before beginning your exercises. Do 5-10 minutes of low-impact activity (like walking or cycling) before starting strengthening exercises. Contact your provider any time you have pain during  or after exercising. Exercise can cause discomfort but should not be painful. It is normal to be a little stiff or sore after exercising. Stretching and range-of-motion exercises Front thigh stretch  Stand up straight and support your body by holding on to a chair or resting one hand on a wall. With your legs straight and close together, bend one knee to lift your heel up toward your butt. Using one hand for support, grab your ankle with your free hand. Pull your foot up closer toward your butt to feel the stretch in front of your thigh. Hold the stretch for 30 seconds. Repeat __________ times. Complete this exercise __________ times a day. Back thigh stretch  Sit on the floor with your back straight and your legs out straight in front of you. Place the palms of your hands on the floor and slide them toward your feet as you bend at the  hip. Try to touch your nose to your knees and feel the stretch in the back of your thighs. Hold for 30 seconds. Repeat __________ times. Complete this exercise __________ times a day. Calf stretch  Stand facing a wall. Place the palms of your hands flat against the wall, arms extended, and lean slightly against the wall. Get into a lunge position with one leg bent at the knee and the other leg stretched out straight behind you. Keep both feet facing the wall and increase the bend in your knee while keeping the heel of the other leg flat on the ground. You should feel the stretch in your calf. Hold for 30 seconds. Repeat __________ times. Complete this exercise __________ times a day. Strengthening exercises Straight leg lift  Lie on your back with one knee bent and the other leg out straight. Slowly lift the straight leg without bending the knee. Lift until your foot is about 12 inches (30 cm) off the floor. Hold for 3-5 seconds and slowly lower your leg. Repeat __________ times. Complete this exercise __________ times a day. Single leg dip  Stand between two chairs and put both hands on the backs of the chairs for support. Extend one leg out straight with your body weight resting on the heel of the standing leg. Slowly bend your standing knee to dip your body to the level that is comfortable for you. Hold for 3-5 seconds. Repeat __________ times. Complete this exercise __________ times a day. Hamstring curls  Stand straight, knees close together, facing the back of a chair. Hold on to the back of a chair with both hands. Keep one leg straight. Bend the other knee while bringing the heel up toward the butt until the knee is bent at a 90-degree angle (right angle). Hold for 3-5 seconds. Repeat __________ times. Complete this exercise __________ times a day. Wall squat  Stand straight with your back, hips, and head against a wall. Step forward one foot at a time with your back  still against the wall. Your feet should be 2 feet (61 cm) from the wall at shoulder width. Keeping your back, hips, and head against the wall, slide down the wall to as close to a sitting position as you can get. Hold for 5-10 seconds, then slowly slide back up. Repeat __________ times. Complete this exercise __________ times a day. Step-ups  Stand in front of a sturdy platform or stool that is about 6 inches (15 cm) high. Slowly step up with your left / right foot, keeping your knee in line with your hip  and foot. Do not let your knee bend so far that you cannot see your toes. Hold on to a chair for balance, but do not use it for support. Slowly unlock your knee and lower yourself to the starting position. Repeat __________ times. Complete this exercise __________ times a day. Contact a health care provider if: Your exercises cause pain. Your pain is worse after you exercise. Your pain prevents you from doing your exercises. This information is not intended to replace advice given to you by your health care provider. Make sure you discuss any questions you have with your health care provider. Document Revised: 06/05/2022 Document Reviewed: 06/05/2022 Elsevier Patient Education  2024 [employer].

## 2024-03-25 ENCOUNTER — Encounter: Payer: Self-pay | Admitting: Internal Medicine

## 2024-03-25 ENCOUNTER — Ambulatory Visit (INDEPENDENT_AMBULATORY_CARE_PROVIDER_SITE_OTHER): Payer: Self-pay | Admitting: Internal Medicine

## 2024-03-25 VITALS — BP 118/82 | HR 59 | Temp 98.3°F | Ht 59.0 in | Wt 154.6 lb

## 2024-03-25 DIAGNOSIS — I10 Essential (primary) hypertension: Secondary | ICD-10-CM | POA: Diagnosis not present

## 2024-03-25 DIAGNOSIS — I5032 Chronic diastolic (congestive) heart failure: Secondary | ICD-10-CM | POA: Diagnosis not present

## 2024-03-25 DIAGNOSIS — Z0000 Encounter for general adult medical examination without abnormal findings: Secondary | ICD-10-CM

## 2024-03-25 DIAGNOSIS — E1122 Type 2 diabetes mellitus with diabetic chronic kidney disease: Secondary | ICD-10-CM

## 2024-03-25 DIAGNOSIS — I130 Hypertensive heart and chronic kidney disease with heart failure and stage 1 through stage 4 chronic kidney disease, or unspecified chronic kidney disease: Secondary | ICD-10-CM | POA: Diagnosis not present

## 2024-03-25 DIAGNOSIS — G309 Alzheimer's disease, unspecified: Secondary | ICD-10-CM

## 2024-03-25 DIAGNOSIS — E7800 Pure hypercholesterolemia, unspecified: Secondary | ICD-10-CM | POA: Diagnosis not present

## 2024-03-25 DIAGNOSIS — N182 Chronic kidney disease, stage 2 (mild): Secondary | ICD-10-CM

## 2024-03-25 DIAGNOSIS — M79641 Pain in right hand: Secondary | ICD-10-CM | POA: Diagnosis not present

## 2024-03-25 DIAGNOSIS — Z23 Encounter for immunization: Secondary | ICD-10-CM

## 2024-03-25 DIAGNOSIS — F02A0 Dementia in other diseases classified elsewhere, mild, without behavioral disturbance, psychotic disturbance, mood disturbance, and anxiety: Secondary | ICD-10-CM

## 2024-03-25 LAB — POCT URINALYSIS DIP (CLINITEK)
Bilirubin, UA: NEGATIVE
Blood, UA: NEGATIVE
Glucose, UA: NEGATIVE mg/dL
Ketones, POC UA: NEGATIVE mg/dL
Leukocytes, UA: NEGATIVE
Nitrite, UA: NEGATIVE
POC PROTEIN,UA: NEGATIVE — NL
Spec Grav, UA: 1.01 (ref 1.010–1.025)
Urobilinogen, UA: 0.2 U/dL
pH, UA: 5 (ref 5.0–8.0)

## 2024-03-25 MED ORDER — NITROGLYCERIN 0.4 MG SL SUBL: 0.4000 mg | SUBLINGUAL_TABLET | SUBLINGUAL | 2 refills | Status: AC | PRN

## 2024-03-25 NOTE — Progress Notes (Signed)
 I,Mariah Burns, CMA,acting as a neurosurgeon for Mariah LOISE Slocumb, MD.,have documented all relevant documentation on the behalf of Mariah LOISE Slocumb, MD,as directed by  Mariah LOISE Slocumb, MD while in the presence of Mariah LOISE Slocumb, MD.  Subjective:    Patient ID: Mariah Burns , female    DOB: 1945/06/28 , 78 y.o.   MRN: [id number]  Chief Complaint  Patient presents with   Annual Exam    The patient is here today for physical exam.   She reports compliance with meds. She denies headaches, chest pain and shortness of breath. She complains of right wrist pain. She has used biofreeze at home & wearing a glove for protection. Both help a little.    Diabetes   Hypertension    HPI Discussed the use of AI scribe software for clinical note transcription with the patient, who gave verbal consent to proceed.  History of Present Illness Mariah Burns is a 78 year old female who presents for a physical exam and diabetes check. She is accompanied by her youngest daughter, Mariah Burns.  She is currently taking Jardiance  for diabetes management.  She experiences no recent falls, although she sometimes has memory lapses.  She experiences pain in her right hand, specifically in the wrist and fingers, described as 'rough' and painful when making a fist. She has not been taking Tylenol  for the pain but uses a copper glove for relief. She has Voltaren gel at home, which she believes might work better than Biofreeze.  Her current medications include Restasis, nitroglycerin  as needed, Zetia  10 mg daily, memantine  twice a day, atorvastatin  40 mg, valsartan  40 mg, aspirin , and amlodipine . She was initially taking memantine  once a day but increased to twice a day since July. She also takes vitamin D  supplements.  She walks daily for about 15 minutes in her neighborhood, accompanied by her daughter. Her appetite is good, and she has chosen to eat only white meat while limiting her portion sizes. She  drinks mostly water and some hot chocolate, avoiding juices and sodas. She reports regular bowel movements and adequate hydration, as indicated by the color of her urine.   Diabetes She presents for her follow-up diabetic visit. She has type 2 diabetes mellitus. Her disease course has been stable. There are no hypoglycemic associated symptoms. Pertinent negatives for diabetes include no blurred vision. There are no hypoglycemic complications. Diabetic complications include nephropathy. Risk factors for coronary artery disease include diabetes mellitus, dyslipidemia, hypertension, sedentary lifestyle and post-menopausal. She is compliant with treatment most of the time.  Hypertension This is a chronic problem. The current episode started more than 1 year ago. The problem has been gradually improving since onset. The problem is controlled. Pertinent negatives include no blurred vision.     Past Medical History:  Diagnosis Date   Abdominal bloating 12/14/2022   Arthritis    Chronic bilateral low back pain without sciatica 10/28/2019   Chronic diastolic heart failure 06/06/2021   Class 1 obesity due to excess calories with serious comorbidity and body mass index (BMI) of 30.0 to 30.9 in adult 10/28/2019   Cyst of right breast 01/30/2017   1 cm     Dry mouth 11/19/2020   She is encouraged to increase fluid intake and to consider use of Biotene products. Also advised to increase her intake of healthy fats - walnuts, avocado etc.      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 Daughter verbally acknowledges  treatment plan.     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 We discussed the use of Trintellix, she declines at this time.      Stage 2 chronic kidney disease 05/15/2018   Type 2 diabetes mellitus, without long-term current use of insulin 05/06/2010   Vitamin D  deficiency 10/28/2019     Family History  Problem Relation Age of Onset   Allergies Mother    Heart failure Father    Allergies Sister    Heart disease Sister    Lupus Sister    Dementia Sister    Heart disease Sister    Gout Brother    Hypertension Brother    Heart disease Brother    Gout Brother    Hypertension Brother    Hypertension Brother    Hypertension Brother    Post-traumatic stress disorder Brother    Allergies Daughter    Healthy Daughter    Healthy Daughter      Current Outpatient Medications:    amLODipine  (NORVASC ) 10 MG tablet, Take 1 tablet (10 mg total) by mouth daily., Disp: 90 tablet, Rfl: 2   ascorbic acid  (VITAMIN C) 500 MG tablet, Take 500 mg by mouth daily., Disp: , Rfl:    aspirin  EC 81 MG tablet, Take 81 mg by mouth daily. Swallow whole., Disp: , Rfl:    atorvastatin  (LIPITOR) 40 MG tablet, TAKE 1 TABLET BY MOUTH DAILY MONDAY THROUGH FRIDAY. SKIP WEEKENDS, Disp: 60 tablet, Rfl: 2   empagliflozin  (JARDIANCE ) 10 MG TABS tablet, Take 1 tablet (10 mg total) by mouth daily before breakfast., Disp: 90 tablet, Rfl: 2   ezetimibe  (ZETIA ) 10 MG tablet, Take 1 tablet (10 mg total) by mouth daily., Disp: 60 tablet, Rfl: 0   memantine  (NAMENDA ) 5 MG tablet, Take 1 tablet (5 mg at night) for 2 weeks, then increase to 1 tablet (5 mg) twice a day, Disp: 180 tablet, Rfl: 1   Menthol, Topical Analgesic, (BIOFREEZE ROLL-ON EX), Apply 1 application. topically as needed (pain in back,neck,thumb)., Disp: , Rfl:  Polyvinyl Alcohol-Povidone PF 1.4-0.6 % SOLN, Place 1 drop into both eyes daily as needed (dry eyes). , Disp: , Rfl:    RESTASIS 0.05 % ophthalmic emulsion, as needed (dry eyes)., Disp: , Rfl:    valsartan  (DIOVAN ) 40 MG tablet, TAKE 1 TABLET BY MOUTH EVERY DAY, Disp: 90 tablet, Rfl: 1   Cholecalciferol (VITAMIN D -3 PO), Take 5,000 Units by mouth daily. (Patient not taking: Reported on 03/25/2024), Disp: , Rfl:    nitroGLYCERIN  (NITROSTAT ) 0.4 MG SL tablet, Place 1 tablet (0.4 mg total) under the tongue every 5 (five) minutes as needed for chest pain., Disp: 25 tablet, Rfl: 2  Current Facility-Administered Medications:    0.9 %  sodium chloride  infusion, , Intravenous, PRN, Moore, Janece, FNP   Allergies  Allergen Reactions   Fosamax [Alendronate Sodium] Other (See Comments)    REACTION: CAUSE GI UPSET AND FATIGUE   Omeprazole Other (See Comments)    Lower ab pain   Motrin [Ibuprofen] Other (See Comments)    GI upset- has to eat prior to taking   Fluvastatin Other (See Comments)    mild memoryproblems of confusion       The patient states she uses none for birth control. No LMP recorded.  Patient has had a hysterectomy.. Negative for Dysmenorrhea. Negative for: breast discharge, breast lump(s), breast pain and breast self exam. Associated symptoms include abnormal vaginal bleeding. Pertinent negatives include abnormal bleeding (hematology), anxiety, decreased libido, depression, difficulty falling sleep, dyspareunia, history of infertility, nocturia, sexual dysfunction, sleep disturbances, urinary incontinence, urinary urgency, vaginal discharge and vaginal itching. Diet regular.The patient states her exercise level is  intermittent.  . The patient's tobacco use is:  Social History   Tobacco Use  Smoking Status Never   Passive exposure: Never  Smokeless Tobacco Never  . She has been exposed to passive smoke. The patient's alcohol use is:  Social History   Substance and Sexual Activity  Alcohol Use No    Review of Systems  Constitutional: Negative.   HENT:  Positive for hearing loss.        She states her daughter states her hearing is not good. She often has to ask her to repeat what she says. Denies having any ear pain/tinnitus.   Eyes: Negative.  Negative for blurred vision.  Respiratory: Negative.    Cardiovascular: Negative.   Gastrointestinal: Negative.   Endocrine: Negative.   Genitourinary: Negative.   Musculoskeletal:  Positive for arthralgias.  Skin: Negative.   Allergic/Immunologic: Negative.   Neurological: Negative.   Hematological: Negative.   Psychiatric/Behavioral: Negative.       Today's Vitals   03/25/24 1104  BP: 118/82  Pulse: (!) 59  Temp: 98.3 F (36.8 C)  SpO2: 98%  Weight: 154 lb 9.6 oz (70.1 kg)  Height: 4' 11 (1.499 m)   Body mass index is 31.23 kg/m.  Wt Readings from Last 3 Encounters:  03/25/24 154 lb 9.6 oz (70.1 kg)  03/11/24 152 lb (68.9 kg)  12/25/23 154 lb (69.9 kg)     Objective:  Physical Exam Vitals and nursing note reviewed.  Constitutional:      Appearance: Normal appearance.  HENT:     Head: Normocephalic  and atraumatic.     Right Ear: Tympanic membrane, ear canal and external ear normal.     Left Ear: Tympanic membrane, ear canal and external ear normal.     Mouth/Throat:     Pharynx: No oropharyngeal exudate or posterior oropharyngeal erythema.  Eyes:  Extraocular Movements: Extraocular movements intact.     Conjunctiva/sclera: Conjunctivae normal.     Pupils: Pupils are equal, round, and reactive to light.  Cardiovascular:     Rate and Rhythm: Normal rate and regular rhythm.     Pulses:          Dorsalis pedis pulses are 1+ on the right side and 1+ on the left side.     Heart sounds: Normal heart sounds.  Pulmonary:     Effort: Pulmonary effort is normal.     Breath sounds: Normal breath sounds.  Chest:  Breasts:    Tanner Score is 5.     Right: Normal.     Left: Normal.     Comments: Healed surgical scar on right breast Abdominal:     General: Bowel sounds are normal.     Palpations: Abdomen is soft.     Comments: Rounded, soft  Genitourinary:    Comments: deferred Musculoskeletal:        General: Normal range of motion.     Cervical back: Normal range of motion and neck supple.     Comments: Pos squeeze test on Right  Feet:     Right foot:     Protective Sensation: 5 sites tested.  5 sites sensed.     Skin integrity: Dry skin present.     Toenail Condition: Right toenails are normal.     Left foot:     Protective Sensation: 5 sites tested.  5 sites sensed.     Skin integrity: Dry skin present.     Toenail Condition: Left toenails are normal.  Skin:    General: Skin is warm and dry.  Neurological:     General: No focal deficit present.     Mental Status: She is alert and oriented to person, place, and time.  Psychiatric:        Mood and Affect: Mood normal.        Behavior: Behavior normal.         Assessment And Plan:     Encounter for general adult medical examination w/o abnormal findings Assessment & Plan: A full exam was performed.  Importance of  monthly self breast exams was discussed with the patient.  All questions were answered to her satisfaction.  She is advised to get 30-45 minutes of regular exercise, no less than four to five days per week. Both weight-bearing and aerobic exercises are recommended.  She is advised to follow a healthy diet with at least six fruits/veggies per day, decrease intake of red meat and other saturated fats and to increase fish intake to twice weekly.  Meats/fish should not be fried -- baked, boiled or broiled is preferable. It is also important to cut back on your sugar intake.  Be sure to read labels - try to avoid anything with added sugar, high fructose corn syrup or other sweeteners.  If you must use a sweetener, you can try stevia or monkfruit.  It is also important to avoid artificially sweetened foods/beverages and diet drinks. Lastly, wear SPF 50 sunscreen on exposed skin and when in direct sunlight for an extended period of time.  Be sure to avoid fast food restaurants and aim for at least 60 ounces of water daily.       Type 2 diabetes mellitus with stage 2 chronic kidney disease, without long-term current use of insulin (HCC) Assessment & Plan: Chronic, diabetic foot exam was performed.  Type 2 diabetes mellitus with associated chronic  kidney disease. No acute issues reported. - Continue Jardiance  as prescribed. - Monitor urine color to ensure adequate hydration. - I DISCUSSED WITH THE PATIENT AT LENGTH REGARDING THE GOALS OF GLYCEMIC CONTROL AND POSSIBLE LONG-TERM COMPLICATIONS.  I  ALSO STRESSED THE IMPORTANCE OF COMPLIANCE WITH HOME GLUCOSE MONITORING, DIETARY RESTRICTIONS INCLUDING AVOIDANCE OF SUGARY DRINKS/PROCESSED FOODS,  ALONG WITH REGULAR EXERCISE.  I  ALSO STRESSED THE IMPORTANCE OF ANNUAL EYE EXAMS, SELF FOOT CARE AND COMPLIANCE WITH OFFICE VISITS.   Orders: -     CBC -     CMP14+EGFR -     Hemoglobin A1c  Hypertensive heart and renal disease with renal failure, stage 1 through stage  4 or unspecified chronic kidney disease, with heart failure (HCC) Assessment & Plan: Chronic, fair control.  Goal BP <130/80.  EKG performed, SB w/ RBBB and nonspecific T abnormality.     - She will continue with amlodipine  10mg  daily, Jardiance  10mg  daily and valsartan  40mg  daily - Follow low sodium diet.  - Follow up in four to six months for re-evaluation.   Orders: -     CMP14+EGFR -     Microalbumin / creatinine urine ratio -     EKG 12-Lead  Chronic diastolic heart failure (HCC) Assessment & Plan: Diastolic heart failure with preserved ejection fraction. No current symptoms of exacerbation. Follow-up echocardiogram not needed until next year unless symptoms worsen. - Follow low salt diet - Continue with Jardiance  10mg  daily   Pure hypercholesterolemia Assessment & Plan: Chronic, she will continue with atorvastatin  40mg  daily.  She is encouraged to follow a heart healthy lifestyle.    Orders: -     CMP14+EGFR  Right hand pain Assessment & Plan: Osteoarthritis of the right hand with reported pain and stiffness. Currently using a copper glove for support. - Use Voltaren gel on the right hand as needed for pain and stiffness. - Consider Tylenol  for pain relief on particularly bad days. - Continue using copper glove for support.   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) Assessment & Plan: Alzheimer's disease with some reported memory issues. Currently taking memantine , but not adhering to twice daily dosing as prescribed. - Reinforce memantine  dosing to twice daily. - Follow up with neurologist in December.   Immunization due -     Flu vaccine HIGH DOSE PF(Fluzone Trivalent)  Other orders -     Nitroglycerin ; Place 1 tablet (0.4 mg total) under the tongue every 5 (five) minutes as needed for chest pain.  Dispense: 25 tablet; Refill: 2   Return for 1 YEAR  HM, 4 MONTH DM F/U.SABRA Patient was given opportunity to ask questions. Patient verbalized understanding of the plan and was able to repeat key elements of the plan. All questions were answered to their satisfaction.   I, Mariah LOISE Slocumb, MD, have reviewed all documentation for this visit. The documentation on 03/25/24 for the exam, diagnosis, procedures, and orders are all accurate and complete.

## 2024-03-25 NOTE — Patient Instructions (Signed)

## 2024-03-26 LAB — CMP14+EGFR
ALT: 22 IU/L (ref 0–32)
AST: 20 IU/L (ref 0–40)
Albumin: 4.4 g/dL (ref 3.8–4.8)
Alkaline Phosphatase: 108 IU/L (ref 49–135)
BUN/Creatinine Ratio: 11 — ABNORMAL LOW (ref 12–28)
BUN: 10 mg/dL (ref 8–27)
Bilirubin Total: 0.6 mg/dL (ref 0.0–1.2)
CO2: 21 mmol/L (ref 20–29)
Calcium: 9.7 mg/dL (ref 8.7–10.3)
Chloride: 104 mmol/L (ref 96–106)
Creatinine, Ser: 0.91 mg/dL (ref 0.57–1.00)
Globulin, Total: 3.3 g/dL (ref 1.5–4.5)
Glucose: 88 mg/dL (ref 70–99)
Potassium: 3.7 mmol/L (ref 3.5–5.2)
Sodium: 141 mmol/L (ref 134–144)
Total Protein: 7.7 g/dL (ref 6.0–8.5)
eGFR: 65 mL/min/1.73 (ref >59)

## 2024-03-26 LAB — CBC
Hematocrit: 42.8 % (ref 34.0–46.6)
Hemoglobin: 13.7 g/dL (ref 11.1–15.9)
MCH: 27.8 pg (ref 26.6–33.0)
MCHC: 32 g/dL (ref 31.5–35.7)
MCV: 87 fL (ref 79–97)
Platelets: 233 x10E3/uL (ref 150–450)
RBC: 4.92 x10E6/uL (ref 3.77–5.28)
RDW: 14.8 % (ref 11.7–15.4)
WBC: 4.6 x10E3/uL (ref 3.4–10.8)

## 2024-03-26 LAB — HEMOGLOBIN A1C
Est. average glucose Bld gHb Est-mCnc: 131 mg/dL
Hgb A1c MFr Bld: 6.2 % — ABNORMAL HIGH (ref 4.8–5.6)

## 2024-03-30 ENCOUNTER — Ambulatory Visit: Payer: Self-pay | Admitting: Internal Medicine

## 2024-03-30 DIAGNOSIS — I10 Essential (primary) hypertension: Secondary | ICD-10-CM | POA: Diagnosis not present

## 2024-03-31 LAB — MICROALBUMIN / CREATININE URINE RATIO
Creatinine, Urine: 52.6 mg/dL
Microalb/Creat Ratio: 16 mg/g{creat} (ref 0–29)
Microalbumin, Urine: 8.5 ug/mL

## 2024-04-01 DIAGNOSIS — M79641 Pain in right hand: Secondary | ICD-10-CM | POA: Insufficient documentation

## 2024-04-01 NOTE — Assessment & Plan Note (Signed)
Chronic, she will continue with atorvastatin 40mg  daily.  She is encouraged to follow a heart healthy lifestyle.

## 2024-04-01 NOTE — Assessment & Plan Note (Signed)
 A full exam was performed.  Importance of monthly self breast exams was discussed with the patient.  All questions were answered to her satisfaction.  She is advised to get 30-45 minutes of regular exercise, no less than four to five days per week. Both weight-bearing and aerobic exercises are recommended.  She is advised to follow a healthy diet with at least six fruits/veggies per day, decrease intake of red meat and other saturated fats and to increase fish intake to twice weekly.  Meats/fish should not be fried -- baked, boiled or broiled is preferable. It is also important to cut back on your sugar intake.  Be sure to read labels - try to avoid anything with added sugar, high fructose corn syrup or other sweeteners.  If you must use a sweetener, you can try stevia or monkfruit.  It is also important to avoid artificially sweetened foods/beverages and diet drinks. Lastly, wear SPF 50 sunscreen on exposed skin and when in direct sunlight for an extended period of time.  Be sure to avoid fast food restaurants and aim for at least 60 ounces of water daily.

## 2024-04-01 NOTE — Assessment & Plan Note (Signed)
 Diastolic heart failure with preserved ejection fraction. No current symptoms of exacerbation. Follow-up echocardiogram not needed until next year unless symptoms worsen. - Follow low salt diet - Continue with Jardiance  10mg  daily

## 2024-04-01 NOTE — Assessment & Plan Note (Addendum)
 Chronic, fair control.  Goal BP <130/80.  EKG performed, SB w/ RBBB and nonspecific T abnormality.     - She will continue with amlodipine  10mg  daily, Jardiance  10mg  daily and valsartan  40mg  daily - Follow low sodium diet.  - Follow up in four to six months for re-evaluation.

## 2024-04-01 NOTE — Assessment & Plan Note (Signed)
 Chronic, diabetic foot exam was performed.  Type 2 diabetes mellitus with associated chronic kidney disease. No acute issues reported. - Continue Jardiance  as prescribed. - Monitor urine color to ensure adequate hydration. - I DISCUSSED WITH THE PATIENT AT LENGTH REGARDING THE GOALS OF GLYCEMIC CONTROL AND POSSIBLE LONG-TERM COMPLICATIONS.  I  ALSO STRESSED THE IMPORTANCE OF COMPLIANCE WITH HOME GLUCOSE MONITORING, DIETARY RESTRICTIONS INCLUDING AVOIDANCE OF SUGARY DRINKS/PROCESSED FOODS,  ALONG WITH REGULAR EXERCISE.  I  ALSO STRESSED THE IMPORTANCE OF ANNUAL EYE EXAMS, SELF FOOT CARE AND COMPLIANCE WITH OFFICE VISITS.

## 2024-04-01 NOTE — Assessment & Plan Note (Addendum)
 Alzheimer's disease with some reported memory issues. Currently taking memantine , but not adhering to twice daily dosing as prescribed. - Reinforce memantine  dosing to twice daily. - Follow up with neurologist in December.

## 2024-04-01 NOTE — Assessment & Plan Note (Signed)
 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 Currently using a copper glove for support. - Use Voltaren gel on the right hand as needed for pain and stiffness. - Consider Tylenol  for pain relief on particularly bad days. - Continue using copper glove for support.

## 2024-04-02 NOTE — Addendum Note (Signed)
 Addended by: EMMITT FINE T on: 04/02/2024 05:09 PM   Modules accepted: Orders

## 2024-04-09 DIAGNOSIS — E785 Hyperlipidemia, unspecified: Secondary | ICD-10-CM | POA: Diagnosis not present

## 2024-04-09 DIAGNOSIS — E1136 Type 2 diabetes mellitus with diabetic cataract: Secondary | ICD-10-CM | POA: Diagnosis not present

## 2024-04-09 DIAGNOSIS — M1990 Unspecified osteoarthritis, unspecified site: Secondary | ICD-10-CM | POA: Diagnosis not present

## 2024-04-09 DIAGNOSIS — E1151 Type 2 diabetes mellitus with diabetic peripheral angiopathy without gangrene: Secondary | ICD-10-CM | POA: Diagnosis not present

## 2024-04-09 DIAGNOSIS — F325 Major depressive disorder, single episode, in full remission: Secondary | ICD-10-CM | POA: Diagnosis not present

## 2024-04-09 DIAGNOSIS — I700 Atherosclerosis of aorta: Secondary | ICD-10-CM | POA: Diagnosis not present

## 2024-04-09 DIAGNOSIS — D860 Sarcoidosis of lung: Secondary | ICD-10-CM | POA: Diagnosis not present

## 2024-04-09 DIAGNOSIS — G309 Alzheimer's disease, unspecified: Secondary | ICD-10-CM | POA: Diagnosis not present

## 2024-04-09 DIAGNOSIS — N1831 Chronic kidney disease, stage 3a: Secondary | ICD-10-CM | POA: Diagnosis not present

## 2024-04-24 DIAGNOSIS — G4733 Obstructive sleep apnea (adult) (pediatric): Secondary | ICD-10-CM | POA: Diagnosis not present

## 2024-04-27 ENCOUNTER — Encounter (HOSPITAL_BASED_OUTPATIENT_CLINIC_OR_DEPARTMENT_OTHER): Payer: Self-pay

## 2024-04-28 ENCOUNTER — Ambulatory Visit: Attending: Internal Medicine | Admitting: Internal Medicine

## 2024-04-28 VITALS — BP 122/74 | HR 58 | Ht 59.0 in | Wt 153.0 lb

## 2024-04-28 DIAGNOSIS — I10 Essential (primary) hypertension: Secondary | ICD-10-CM | POA: Diagnosis not present

## 2024-04-28 DIAGNOSIS — F02A0 Dementia in other diseases classified elsewhere, mild, without behavioral disturbance, psychotic disturbance, mood disturbance, and anxiety: Secondary | ICD-10-CM | POA: Diagnosis not present

## 2024-04-28 DIAGNOSIS — I5032 Chronic diastolic (congestive) heart failure: Secondary | ICD-10-CM

## 2024-04-28 DIAGNOSIS — G309 Alzheimer's disease, unspecified: Secondary | ICD-10-CM | POA: Diagnosis not present

## 2024-04-28 NOTE — Progress Notes (Signed)
 Cardiology Office Note:    Date:  04/28/2024   ID:  Mariah Burns, DOB 1945-10-29, MRN [id number]  PCP:  Mariah Medici, MD   [facility] Providers Cardiologist:  Mariah DELENA Leavens, MD     Referring MD: Mariah Medici, MD   CC: Follow up chest pain (possible)  History of Present Illness:    Mariah Burns is a 78 y.o. female with a hx of HTN, HFpEF, pulmonary sarcoidosis, and OSA on CPAP. History of breast cancer with 2020 seed radiation, but without cardiotoxic chemotherapy or immuno therapy. 2020: Echo shows elevated LA pressure, small, circumferential pericardial effusion, an Moderate central tricuspid regurgitation without RV dilation or dysfunction. 2024: New CP, meds changes to improve BP; patient and daugther were not sure if they wanted PET MPI.  Stopped doing exercise for CP  BP cuff was validated.  Started on Valsartan    [name] is a 78 yo F with hypertension and coronary artery disease who presents for evaluation of chest pain. She is accompanied by her child.  She has a history of hypertension, generally well-controlled with blood pressure readings averaging around 118/82 mmHg. Her blood pressure today was 145/72 mmHg. She does not regularly check her blood pressure at home, but her child confirms that recent readings from other clinicians have been normal.  She has mild non-obstructive coronary artery disease, identified via a CT scan in 2024. There has been some confusion regarding whether she has experienced chest pain, as she initially reported it but later denied having any. She currently feels fine and denies any recent chest pain or heart racing.  Her past medical history includes pulmonary sarcoidosis, obstructive sleep apnea managed with CPAP, and a history of breast cancer treated with high-grade radiation to the chest field. She does not have cardiac sarcoidosis and did not receive cardiotoxic chemotherapy or immunotherapy.  She is  currently on memantine . Her child notes some neurocognitive changes, primarily affecting short-term memory, but she appears less fearful and more active, walking in her neighborhood.  Past Medical History:  Diagnosis Date   Abdominal bloating 12/14/2022   Arthritis    Chronic bilateral low back pain without sciatica 10/28/2019   Chronic diastolic heart failure 06/06/2021   Class 1 obesity due to excess calories with serious comorbidity and body mass index (BMI) of 30.0 to 30.9 in adult 10/28/2019   Cyst of right breast 01/30/2017   1 cm     Dry mouth 11/19/2020   She is encouraged to increase fluid intake and to consider use of Biotene products. Also advised to increase her intake of healthy fats - walnuts, avocado etc.      Dysphagia    Essential hypertension 03/21/2007   Current HTN meds: amlodipine  10 mg daily,valsartan  40 mg daily   Previously tried: felodipine  10 mg -dizziness   BP goal: <130/80   Gastroesophageal reflux disease    Generalized anxiety disorder 02/23/2022   Hemorrhage of anus and rectum    Hip pain, bilateral 10/28/2019   History of colonic polyps    Hyperlipidemia    Hypertensive nephropathy 05/15/2018   Incomplete right bundle branch block 02/17/2015   Insomnia 03/30/2021   Malignant neoplasm of upper-outer quadrant of right breast in female, estrogen receptor positive 08/11/2018   Mild dementia, concerns for Alzheimer's disease 06/11/2023   Obstructive sleep apnea 03/21/2007   NPSG 03/08/2010  AHI 19.8/hr            Osteopenia    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 Daughter verbally acknowledges  treatment plan.     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 We discussed the use of Trintellix, she declines at this time.      Stage 2 chronic kidney disease 05/15/2018   Type 2 diabetes mellitus, without long-term current use of insulin 05/06/2010   Vitamin D  deficiency 10/28/2019    Past Surgical History:  Procedure Laterality Date   BREAST LUMPECTOMY WITH RADIOACTIVE SEED AND SENTINEL LYMPH NODE BIOPSY Right 09/02/2018   Procedure: RIGHT BREAST LUMPECTOMY WITH RADIOACTIVE SEED AND  RIGHT SENTINEL LYMPH NODE BIOPSY;  Surgeon: Mikell Katz, MD;  Location: [facility];  Service: General;  Laterality: Right;   BRONCHOSCOPY     dx'd sarcoid   KNEE ARTHROPLASTY Left 03/13/2017   NEUROPLASTY / TRANSPOSITION ULNAR NERVE AT ELBOW  09/2011   TOTAL ABDOMINAL HYSTERECTOMY      Current Medications: Current Meds  Medication Sig   amLODipine  (NORVASC ) 10 MG tablet Take 1 tablet (10 mg total) by mouth daily.   ascorbic acid (VITAMIN C) 500 MG tablet Take 500 mg by mouth daily.   aspirin  EC 81 MG tablet Take 81 mg by mouth daily. Swallow whole.   atorvastatin  (LIPITOR) 40 MG tablet TAKE 1 TABLET BY MOUTH DAILY MONDAY THROUGH FRIDAY. SKIP WEEKENDS   Cholecalciferol (VITAMIN D -3 PO) Take 5,000 Units by mouth daily.   empagliflozin  (JARDIANCE ) 10 MG TABS tablet Take 1 tablet (10 mg total) by mouth daily before breakfast.   ezetimibe  (ZETIA ) 10 MG tablet Take 1 tablet (10 mg total) by mouth daily.   memantine  (NAMENDA ) 5 MG tablet Take 1 tablet (5 mg at night) for 2 weeks, then increase to 1 tablet (5 mg) twice a day   Menthol, Topical Analgesic, (BIOFREEZE ROLL-ON EX) Apply 1 application. topically as needed  (pain in back,neck,thumb).   nitroGLYCERIN  (NITROSTAT ) 0.4 MG SL tablet Place 1 tablet (0.4 mg total) under the tongue every 5 (five) minutes as needed for chest pain.   Polyvinyl Alcohol-Povidone PF 1.4-0.6 % SOLN Place 1 drop into both eyes daily as needed (dry eyes).    RESTASIS 0.05 % ophthalmic emulsion as needed (dry eyes).   valsartan  (DIOVAN ) 40 MG tablet TAKE 1 TABLET BY MOUTH EVERY DAY   Current Facility-Administered Medications for the 04/28/24 encounter (Office Visit) with Santo Mariah LABOR, MD  Medication   0.9 %  sodium chloride  infusion     Allergies:   Fosamax [alendronate sodium], Omeprazole, Motrin [ibuprofen], and Fluvastatin   Social History   Socioeconomic History   Marital status: Divorced    Spouse name: Not on file   Number of children: 3   Years of education: 16   Highest education level: Bachelor's degree (e.g., BA, AB, BS)  Occupational History   Occupation: Retired    Associate Professor: RETIRED  Tobacco Use   Smoking  status: Never    Passive exposure: Never   Smokeless tobacco: Never  Vaping Use   Vaping status: Never Used  Substance and Sexual Activity   Alcohol use: No   Drug use: No   Sexual activity: Not Currently  Other Topics Concern   Not on file  Social History Narrative   05/23/21 Patient lives at home with youngest daughter.   Caffeine Use: none   Right handed   One story home   retired   Chief Executive Officer Drivers of [employer]: Low Risk  (12/25/2023)   Overall Financial Resource Strain (CARDIA)    Difficulty of Paying Living Expenses: Not hard at all  Food Insecurity: No Food Insecurity (12/25/2023)   Hunger Vital Sign    Worried About Running Out of Food in the Last Year: Never true    Ran Out of Food in the Last Year: Never true  Transportation Needs: No Transportation Needs (12/25/2023)   PRAPARE - Administrator, Civil Service (Medical): No    Lack of Transportation (Non-Medical): No  Physical  Activity: Insufficiently Active (12/25/2023)   Exercise Vital Sign    Days of Exercise per Week: 7 days    Minutes of Exercise per Session: 20 min  Stress: No Stress Concern Present (12/25/2023)   [employer] of Occupational Health - Occupational Stress Questionnaire    Feeling of Stress: Not at all  Social Connections: Moderately Isolated (12/25/2023)   Social Connection and Isolation Panel    Frequency of Communication with Friends and Family: More than three times a week    Frequency of Social Gatherings with Friends and Family: More than three times a week    Attends Religious Services: More than 4 times per year    Active Member of [employer] or Organizations: No    Attends Engineer, Structural: Never    Marital Status: Divorced     Family History: The patient's family history includes Allergies in her daughter, mother, and sister; Dementia in her sister; Gout in her brother and brother; Healthy in her daughter and daughter; Heart disease in her brother, sister, and sister; Heart failure in her father; Hypertension in her brother, brother, brother, and brother; Lupus in her sister; Post-traumatic stress disorder in her brother.  ROS:   Please see the history of present illness.     EKGs/Labs/Other Studies Reviewed:    The following studies were reviewed today:  EKG:   07/27/22: SR rate 67 with RBBB and artifact  Cardiac Studies & Procedures   ______________________________________________________________________________________________     ECHOCARDIOGRAM  ECHOCARDIOGRAM COMPLETE 08/23/2022  Narrative ECHOCARDIOGRAM REPORT    Patient Name:   CAROLLEE NUSSBAUMER Date of Exam: 08/23/2022 Medical Rec #:  [id number]         Height:       59.0 in Accession #:    [id number]        Weight:       147.0 lb Date of Birth:  [birth year]        BSA:          1.618 m Patient Age:    76 years          BP:           148/70 mmHg Patient Gender: F                 HR:           66  bpm. Exam Location:  [facility]  [address]  Procedure: 2D Echo, Cardiac Doppler and Color Doppler  Indications:    I07.1 Tricuspid Valve Insufficiency.  History:        Patient has prior history of Echocardiogram examinations, most recent 02/13/2019. Risk Factors:Hypertension, Diabetes, Dyslipidemia and Pre-diabetes. Obstructive sleep apnea. Sarcoidosis.  Sonographer:    Carl Coma RDCS Referring Phys: [id number] [facility] A Nathan Moctezuma  IMPRESSIONS   1. Left ventricular ejection fraction, by estimation, is >75%. The left ventricle has hyperdynamic function. The left ventricle has no regional wall motion abnormalities. Left ventricular diastolic parameters are consistent with Grade I diastolic dysfunction (impaired relaxation). Elevated left atrial pressure. 2. Right ventricular systolic function is normal. The right ventricular size is normal. 3. The mitral valve is normal in structure. Trivial mitral valve regurgitation. No evidence of mitral stenosis. 4. The aortic valve is tricuspid. Aortic valve regurgitation is trivial. Aortic valve sclerosis is present, with no evidence of aortic valve stenosis. 5. The inferior vena cava is normal in size with greater than 50% respiratory variability, suggesting right atrial pressure of 3 mmHg.  FINDINGS Left Ventricle: Left ventricular ejection fraction, by estimation, is >75%. The left ventricle has hyperdynamic function. The left ventricle has no regional wall motion abnormalities. The left ventricular internal cavity size was normal in size. There is no left ventricular hypertrophy. Left ventricular diastolic parameters are consistent with Grade I diastolic dysfunction (impaired relaxation). Elevated left atrial pressure.  Right Ventricle: The right ventricular size is normal. Right ventricular systolic function is normal.  Left Atrium: Left atrial size was normal in size.  Right Atrium: Right atrial size was normal in size.  Pericardium:  Trivial pericardial effusion is present.  Mitral Valve: The mitral valve is normal in structure. Trivial mitral valve regurgitation. No evidence of mitral valve stenosis.  Tricuspid Valve: The tricuspid valve is normal in structure. Tricuspid valve regurgitation is mild . No evidence of tricuspid stenosis.  Aortic Valve: The aortic valve is tricuspid. Aortic valve regurgitation is trivial. Aortic valve sclerosis is present, with no evidence of aortic valve stenosis.  Pulmonic Valve: The pulmonic valve was normal in structure. Pulmonic valve regurgitation is trivial. No evidence of pulmonic stenosis.  Aorta: The aortic root is normal in size and structure.  Venous: The inferior vena cava is normal in size with greater than 50% respiratory variability, suggesting right atrial pressure of 3 mmHg.  IAS/Shunts: No atrial level shunt detected by color flow Doppler.   LEFT VENTRICLE PLAX 2D LVIDd:         4.00 cm   Diastology LVIDs:         2.20 cm   LV e' medial:    4.79 cm/s LV PW:         1.00 cm   LV E/e' medial:  20.2 LV IVS:        1.00 cm   LV e' lateral:   5.71 cm/s LVOT diam:     1.60 cm   LV E/e' lateral: 16.9 LV SV:         59 LV SV Index:   36 LVOT Area:     2.01 cm   RIGHT VENTRICLE             IVC RV Basal diam:  3.20 cm     IVC diam: 1.30 cm RV S prime:     13.70 cm/s TAPSE (M-mode): 1.9 cm  LEFT ATRIUM             Index  RIGHT ATRIUM          Index LA diam:        4.80 cm 2.97 cm/m   RA Area:     9.83 cm LA Vol (A2C):   38.6 ml 23.85 ml/m  RA Volume:   19.30 ml 11.93 ml/m LA Vol (A4C):   20.6 ml 12.73 ml/m LA Biplane Vol: 28.7 ml 17.74 ml/m AORTIC VALVE LVOT Vmax:   126.50 cm/s LVOT Vmean:  83.400 cm/s LVOT VTI:    0.291 m  AORTA Ao Root diam: 3.00 cm Ao Asc diam:  3.30 cm  MITRAL VALVE                TRICUSPID VALVE MV Area (PHT): 2.79 cm     TR Peak grad:   32.5 mmHg MV Decel Time: 272 msec     TR Vmax:        285.00 cm/s MV E velocity:  96.70 cm/s MV A velocity: 111.50 cm/s  SHUNTS MV E/A ratio:  0.87         Systemic VTI:  0.29 m Systemic Diam: 1.60 cm  Redell Shallow MD Electronically signed by Redell Shallow MD Signature Date/Time: 08/23/2022/3:03:41 PM    Final      CT SCANS  CT CORONARY MORPH W/CTA COR W/SCORE 08/14/2022  Addendum 08/17/2022  9:25 AM ADDENDUM REPORT: 08/17/2022 09:22  ADDENDUM: OVER-READ INTERPRETATION  CT CHEST  The following report is an over-read performed by radiologist Dr. CHARM Toribio Faes III MD of [facility]. This over-read does not include interpretation of cardiac or coronary anatomy or pathology. The CTA interpretation by cardiologist is attached.  Trace pericardial fluid. No mediastinal mass or adenopathy localized. Small hiatal hernia. No pleural effusion. No pneumothorax. Visualized portions of lung fields are clear. Visualized upper abdomen unremarkable. Regional bones unremarkable.  IMPRESSION: No acute findings.   Electronically Signed By: JONETTA Faes M.D. On: 08/17/2022 09:22  Narrative CLINICAL DATA:  78 yo female with chest pain  EXAM: Cardiac/Coronary CTA  TECHNIQUE: A non-contrast, gated CT scan was obtained with axial slices of 3 mm through the heart for calcium  scoring. Calcium  scoring was performed using the Agatston method. A 120 kV prospective, gated, contrast cardiac scan was obtained. Gantry rotation speed was 250 msecs and collimation was 0.6 mm. Two sublingual nitroglycerin  tablets (0.8 mg) were given. The 3D data set was reconstructed in 5% intervals of the 35-75% of the R-R cycle. Diastolic phases were analyzed on a dedicated workstation using MPR, MIP, and VRT modes. The patient received 95 cc of contrast.  FINDINGS: Image quality: Average.  Noise artifact is: Limited. (mis-registration).  Coronary Arteries:  Normal coronary origin.  Right dominance.  Left main: The left main is a large caliber vessel with a  normal take off from the left coronary cusp that trifurcates into a LAD, LCX, and ramus intermedius. There is no plaque or stenosis.  Left anterior descending artery: The LAD has minimal (0-24) calcified plaque in the ostium and mild (25-49) soft plaque at takeoff off D1. The LAD gives off 2 small, patent diagonal branches.  Ramus intermedius: Patent with no evidence of plaque or stenosis.  Left circumflex artery: The LCX is non-dominant and patent with no evidence of plaque or stenosis (mid and distal vessel somewhat difficult to assess). The LCX gives off small OM1 and larger OM2.  Right coronary artery: The RCA is dominant with normal take off from the right coronary cusp. There is no evidence of  plaque or stenosis. The RCA terminates as a PDA and right posterolateral branch without evidence of plaque or stenosis.  Right Atrium: Right atrial size is within normal limits.  Right Ventricle: The right ventricular cavity is within normal limits.  Left Atrium: Left atrial size is normal in size with no left atrial appendage filling defect.  Left Ventricle: The ventricular cavity size is within normal limits. There are no stigmata of prior infarction. There is no abnormal filling defect.  Pulmonary arteries: Mildly dilated (3.2 cm).  Pulmonary veins: Normal pulmonary venous drainage.  Pericardium: Normal thickness with no significant effusion or calcium  present.  Cardiac valves: The aortic valve is trileaflet without significant calcification. The mitral valve is normal structure without significant calcification.  Aorta: Normal caliber with aortic atherosclerosis.  Extra-cardiac findings: See attached radiology report for non-cardiac structures.  IMPRESSION: 1. Coronary calcium  score of 51.4. This was 41 percentile for age-, sex, and race-matched controls.  2. Normal coronary origin with right dominance.  3. Mild (25-49) soft plaque in the LAD at takeoff of  diagonal.  4. Aortic atherosclerosis.  5. Mildly dilated pulmonary artery (3.2 cm) suggestive of pulmonary hypertension.  RECOMMENDATIONS: CAD-RADS 2: Mild non-obstructive CAD (25-49%). Consider non-atherosclerotic causes of chest pain. Consider preventive therapy and risk factor modification.  Redell Shallow, MD  Electronically Signed: By: Redell Shallow M.D. On: 08/14/2022 14:15     ______________________________________________________________________________________________       Recent Labs: 12/25/2023: TSH 1.910 03/25/2024: ALT 22; BUN 10; Creatinine, Ser 0.91; Hemoglobin 13.7; Platelets 233; Potassium 3.7; Sodium 141  Recent Lipid Panel    Component Value Date/Time   CHOL 115 12/25/2023 0951   TRIG 60 12/25/2023 0951   HDL 50 12/25/2023 0951   CHOLHDL 2.3 12/25/2023 0951   LDLCALC 52 12/25/2023 0951   Physical Exam:    VS:  BP 122/74 (BP Location: Right Arm, Patient Position: Sitting, Cuff Size: Normal)   Pulse (!) 58   Ht 4' 11 (1.499 m)   Wt 153 lb (69.4 kg)   SpO2 97%   BMI 30.90 kg/m     Wt Readings from Last 3 Encounters:  04/28/24 153 lb (69.4 kg)  03/25/24 154 lb 9.6 oz (70.1 kg)  03/11/24 152 lb (68.9 kg)    GEN:  Well nourished, well developed in no acute distress HEENT: Normal NECK: No JVD CARDIAC: RRR, Soft systolic murmur no rubs, gallops RESPIRATORY:  Clear to auscultation without rales, wheezing or rhonchi  ABDOMEN: Soft, non-tender, non-distended MUSCULOSKELETAL:  Trace left  edema; No deformity  SKIN: Warm and dry NEUROLOGIC:  Alert and oriented person, place and time; started signing to me at our visit today PSYCHIATRIC:  Normal affect    ASSESSMENT/PLAN:     Mild non-obstructive coronary artery disease HLD Prior chest radiation (Breast Cancer) - Identified in 2024 with no current chest pain or significant symptoms. Conservative management is appropriate given the mild nature of the disease and absence of significant  symptoms. - Continue conservative management without aggressive interventions; no change in therapy; LDL < 70  Hypertension Blood pressure today was 145/72, higher than usual readings of 118/82. Considered an outlier. Risk of hypotension and falls if blood pressure is lowered too much. No immediate changes to antihypertensive regimen unless blood pressure consistently rises. - Continue to monitor blood pressure and will consider increasing valsartan  dose if blood pressure consistently rises.  Right bundle branch block Known right bundle branch block with no current symptoms or changes in management. - Continue current  management without changes.  Mild tricuspid regurgitation - repeat echo in 2027 if within GOC  RBBB Bradycardia  -Monitor, bradycardia may be memantine  related  One year unless new changes  Longitudinal care: The evaluation and management services provided today reflect the complexity inherent in caring for this patient, including the ongoing longitudinal relationship and management of multiple chronic conditions and/or the need for care coordination. The visit required a comprehensive assessment and management plan tailored to the patient's unique needs Time was spent addressing not only the acute concerns but also the broader context of the patient's health, including preventive care, chronic disease management, and care coordination as appropriate.  Complex longitudinal is necessary for conditions including: Secondary prevention in robust patient with new MCD.  Discussed exercise intervention for longevity and mental status.  Daughters were present and one joined on the phone         Mariah Leavens, MD FASE [facility] Cardiologist [facility]  [address] [place], [place] [address] [phone]  12:59 PM

## 2024-04-28 NOTE — Patient Instructions (Signed)
 Medication Instructions:  Your physician recommends that you continue on your current medications as directed. Please refer to the Current Medication list given to you today.  *If you need a refill on your cardiac medications before your next appointment, please call your pharmacy*   Follow-Up: At [facility], you and your health needs are our priority.  As part of our continuing mission to provide you with exceptional heart care, our providers are all part of one team.  This team includes your primary Cardiologist (physician) and Advanced Practice Providers or APPs (Physician Assistants and Nurse Practitioners) who all work together to provide you with the care you need, when you need it.  Your next appointment:   12 month(s)  Provider:   Stanly DELENA Leavens, MD

## 2024-05-02 ENCOUNTER — Other Ambulatory Visit: Payer: Self-pay | Admitting: Internal Medicine

## 2024-05-05 ENCOUNTER — Encounter: Payer: Self-pay | Admitting: Internal Medicine

## 2024-05-11 ENCOUNTER — Encounter: Payer: Self-pay | Admitting: Diagnostic Neuroimaging

## 2024-05-11 ENCOUNTER — Ambulatory Visit: Admitting: Diagnostic Neuroimaging

## 2024-05-11 VITALS — BP 149/67 | HR 66 | Ht 59.0 in | Wt 160.0 lb

## 2024-05-11 DIAGNOSIS — F03A0 Unspecified dementia, mild, without behavioral disturbance, psychotic disturbance, mood disturbance, and anxiety: Secondary | ICD-10-CM | POA: Diagnosis not present

## 2024-05-11 NOTE — Patient Instructions (Addendum)
  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 - aim for at least 7-8 hours sleep per night (or more) - avoid smoking and alcohol - caution with medications, finances; no driving - safety / supervision issues reviewed - caregiver resources provided (including [employer], [facility], [employer] [facility] [employer])

## 2024-05-11 NOTE — Progress Notes (Signed)
 [facility]  PATIENT: Mariah Burns DOB: 1946-03-15  REFERRING CLINICIAN: Jarold Medici, MD HISTORY FROM: patient  REASON FOR VISIT: new consult   HISTORICAL  CHIEF COMPLAINT:  Chief Complaint  Patient presents with   RM 7     Patient is here with daughter for memory concerns  MMSE -22     HISTORY OF PRESENT ILLNESS:   UPDATE (05/11/24, VRP): Since last visit, followed up with other neurology clinic in 2024 due to progression of symptoms.  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  Has been on memantine .  Patient's daughter requested transfer care back to us .  At this point patient denies any major medical problems.  Daughter and family note some repetition or conversations, short-term memory loss, slightly more difficulty with some ADLs especially shopping, finances and driving.  Has not driven for the last 2 years.  UPDATE (05/23/21, VRP): Since last visit, now having progressive memory loss.more issues with navigation, short term memory recall. No major changes in ADLs otherwise. Still shopping, driving, independent overall. Daughter lives with patient.    PRIOR HPI (09/01/13): 78 year old female here for evaluation of post traumatic headaches since 07/25/13.   Patient was walking, slipped on ice, fell and struck her head. She had significant stunned and dazed feeling. She had pain on the right side of her head and left occipital region. Patient was able to stand up and go inside. When patient's daughter came home they went to urgent care and then referred emergency room. Patient was diagnosed with concussion. Neuroimaging studies are unremarkable.   Since that time patient has had intermittent headaches, blurred vision, photosensitivity, photophobia phonophobia, decreased attention, verbal and cognitive difficulties. She's also having more agitation lately.    REVIEW OF SYSTEMS: Full 14 system review of  systems performed and negative with exception of: as per HPI.  ALLERGIES: Allergies  Allergen Reactions   Fosamax [Alendronate Sodium] Other (See Comments)    REACTION: CAUSE GI UPSET AND FATIGUE   Omeprazole Other (See Comments)    Lower ab pain   Motrin [Ibuprofen] Other (See Comments)    GI upset- has to eat prior to taking   Fluvastatin Other (See Comments)    mild memoryproblems of confusion     HOME MEDICATIONS: Outpatient Medications Prior to Visit  Medication Sig Dispense Refill   amLODipine  (NORVASC ) 10 MG tablet Take 1 tablet (10 mg total) by mouth daily. 90 tablet 2   ascorbic acid (VITAMIN C) 500 MG tablet Take 500 mg by mouth daily.     aspirin  EC 81 MG tablet Take 81 mg by mouth daily. Swallow whole.     atorvastatin  (LIPITOR) 40 MG tablet TAKE 1 TABLET BY MOUTH DAILY MONDAY THROUGH FRIDAY. SKIP WEEKENDS 60 tablet 2   Cholecalciferol (VITAMIN D -3 PO) Take 5,000 Units by mouth daily.     empagliflozin  (JARDIANCE ) 10 MG TABS tablet Take 1 tablet (10 mg total) by mouth daily before breakfast. 90 tablet 2   ezetimibe  (ZETIA ) 10 MG tablet TAKE 1 TABLET BY MOUTH EVERY DAY 90 tablet 3   memantine  (NAMENDA ) 5 MG tablet Take 1 tablet (5 mg at night) for 2 weeks, then increase to 1 tablet (5 mg) twice a day 180 tablet 1   Menthol, Topical Analgesic, (BIOFREEZE ROLL-ON EX) Apply 1 application. topically as needed (pain in back,neck,thumb).     nitroGLYCERIN  (NITROSTAT ) 0.4 MG SL tablet Place 1 tablet (0.4 mg total) under the tongue every 5 (five)  minutes as needed for chest pain. 25 tablet 2   Polyvinyl Alcohol-Povidone PF 1.4-0.6 % SOLN Place 1 drop into both eyes daily as needed (dry eyes).      RESTASIS 0.05 % ophthalmic emulsion as needed (dry eyes).     valsartan  (DIOVAN ) 40 MG tablet TAKE 1 TABLET BY MOUTH EVERY DAY 90 tablet 1   Facility-Administered Medications Prior to Visit  Medication Dose Route Frequency Provider Last Rate Last Admin   0.9 %  sodium chloride   infusion   Intravenous PRN Moore, Janece, FNP        PAST MEDICAL HISTORY: Past Medical History:  Diagnosis Date   Abdominal bloating 12/14/2022   Arthritis    Chronic bilateral low back pain without sciatica 10/28/2019   Chronic diastolic heart failure 06/06/2021   Class 1 obesity due to excess calories with serious comorbidity and body mass index (BMI) of 30.0 to 30.9 in adult 10/28/2019   Cyst of right breast 01/30/2017   1 cm     Dry mouth 11/19/2020   She is encouraged to increase fluid intake and to consider use of Biotene products. Also advised to increase her intake of healthy fats - walnuts, avocado etc.      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 Daughter verbally acknowledges  treatment plan.     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 We discussed the use of Trintellix, she declines at this time.      Stage 2 chronic kidney disease 05/15/2018   Type 2 diabetes mellitus, without long-term current use of insulin 05/06/2010   Vitamin D  deficiency 10/28/2019    PAST SURGICAL HISTORY: Past Surgical History:  Procedure Laterality Date   BREAST LUMPECTOMY WITH RADIOACTIVE SEED AND SENTINEL LYMPH NODE BIOPSY Right 09/02/2018   Procedure: RIGHT BREAST LUMPECTOMY WITH RADIOACTIVE SEED AND  RIGHT SENTINEL LYMPH NODE BIOPSY;  Surgeon: Mikell Katz, MD;  Location: [facility];  Service: General;  Laterality: Right;   BRONCHOSCOPY     dx'd sarcoid   KNEE ARTHROPLASTY  Left 03/13/2017   NEUROPLASTY / TRANSPOSITION ULNAR NERVE AT ELBOW  09/2011   TOTAL ABDOMINAL HYSTERECTOMY      FAMILY HISTORY: Family History  Problem Relation Age of Onset   Allergies Mother    Heart failure Father    Allergies Sister    Heart disease Sister    Lupus Sister    Dementia Sister    Heart disease Sister    Gout Brother    Hypertension Brother    Heart disease Brother    Gout Brother    Hypertension Brother    Hypertension Brother    Hypertension Brother    Post-traumatic stress disorder Brother    Stroke Maternal Grandmother    Allergies Daughter    Healthy Daughter    Healthy Daughter    Seizures Neg Hx     SOCIAL HISTORY: Social History   Socioeconomic History   Marital status: Divorced    Spouse name: Not on file   Number of children: 3   Years of education: 16   Highest education level: Bachelor's degree (e.g., BA, AB, BS)  Occupational History   Occupation: Retired    Associate Professor: RETIRED  Tobacco Use   Smoking status: Never    Passive exposure: Never   Smokeless  tobacco: Never  Vaping Use   Vaping status: Never Used  Substance and Sexual Activity   Alcohol use: No   Drug use: No   Sexual activity: Not Currently  Other Topics Concern   Not on file  Social History Narrative   05/23/21 Patient lives at home with youngest daughter.   Caffeine Use: none   Right handed   One story home   retired   Chief Executive Officer Drivers of [employer]: Low Risk  (12/25/2023)   Overall Financial Resource Strain (CARDIA)    Difficulty of Paying Living Expenses: Not hard at all  Food Insecurity: No Food Insecurity (12/25/2023)   Hunger Vital Sign    Worried About Running Out of Food in the Last Year: Never true    Ran Out of Food in the Last Year: Never true  Transportation Needs: No Transportation Needs (12/25/2023)   PRAPARE - Administrator, Civil Service (Medical): No    Lack of Transportation (Non-Medical): No  Physical Activity: Insufficiently Active (12/25/2023)   Exercise Vital Sign    Days of Exercise per Week: 7 days    Minutes of Exercise per Session: 20 min  Stress: No Stress Concern Present (12/25/2023)   [employer] of Occupational Health - Occupational Stress Questionnaire    Feeling of Stress: Not at all  Social Connections: Moderately Isolated (12/25/2023)   Social Connection and Isolation Panel    Frequency of Communication with Friends and Family: More than three times a week    Frequency of Social Gatherings with Friends and Family: More than three times a week    Attends Religious Services: More than 4 times per year    Active Member of [employer] or Organizations: No    Attends Banker Meetings: Never    Marital Status: Divorced  Catering Manager Violence: Not At Risk (12/25/2023)   Humiliation, Afraid, Rape, and Kick questionnaire    Fear of Current or Ex-Partner: No    Emotionally Abused: No    Physically Abused: No    Sexually Abused: No     PHYSICAL EXAM  GENERAL  EXAM/CONSTITUTIONAL: Vitals:  Vitals:   05/11/24 1120  BP: (!) 149/67  Pulse: 66  SpO2: 99%  Weight: 160 lb (72.6 kg)  Height: 4' 11 (1.499 m)   Body mass index is 32.32 kg/m. Wt Readings from Last 3 Encounters:  05/11/24 160 lb (72.6 kg)  04/28/24 153 lb (69.4 kg)  03/25/24 154 lb 9.6 oz (70.1 kg)   Patient is in no distress; well developed, nourished and groomed; neck is supple  CARDIOVASCULAR: Examination of carotid arteries is normal; no carotid bruits Regular rate and rhythm, no murmurs Examination of peripheral vascular system by observation and palpation is normal  EYES: Ophthalmoscopic exam of optic discs and posterior segments is normal; no papilledema or hemorrhages No results found.  MUSCULOSKELETAL: Gait, strength, tone, movements noted in Neurologic exam below  NEUROLOGIC: MENTAL STATUS:     05/11/2024   11:32 AM 05/23/2021   10:21 AM  MMSE - Mini Mental State Exam  Orientation to time 4 5  Orientation to Place 4 4  Registration 3 3  Attention/ Calculation 0 3  Recall 3 1  Language- name 2 objects 2 2  Language- repeat 1 0  Language- follow 3 step command 3 3  Language- read & follow direction 1 1  Write a sentence 1 1  Copy design 0 1  Total score 22 24   awake, alert, oriented to person, place and time DECR RECALL DECR attention and concentration language fluent, comprehension intact, naming intact fund of knowledge appropriate  CRANIAL NERVE:  2nd - no papilledema on fundoscopic exam 2nd, 3rd, 4th, 6th - pupils equal and reactive to light, visual fields full to confrontation, extraocular muscles intact, no nystagmus 5th - facial sensation symmetric 7th - facial strength symmetric 8th - hearing intact 9th - palate elevates symmetrically, uvula midline 11th - shoulder shrug symmetric 12th - tongue protrusion midline  MOTOR:  normal bulk and tone, full strength in the BUE, BLE  SENSORY:  normal and symmetric to light  touch  COORDINATION:  finger-nose-finger, fine finger movements normal  REFLEXES:  deep tendon reflexes 1+ and symmetric  GAIT/STATION:  narrow based gait     DIAGNOSTIC DATA (LABS, IMAGING, TESTING) - I reviewed patient records, labs, notes, testing and imaging myself where available.  Lab Results  Component Value Date   WBC 4.6 03/25/2024   HGB 13.7 03/25/2024   HCT 42.8 03/25/2024   MCV 87 03/25/2024   PLT 233 03/25/2024      Component Value Date/Time   NA 141 03/25/2024 1226   K 3.7 03/25/2024 1226   CL 104 03/25/2024 1226   CO2 21 03/25/2024 1226   GLUCOSE 88 03/25/2024 1226   GLUCOSE 100 (H) 08/29/2023 1111   BUN 10 03/25/2024 1226   CREATININE 0.91 03/25/2024 1226   CREATININE 0.87 08/29/2023 1111   CALCIUM  9.7 03/25/2024 1226   PROT 7.7 03/25/2024 1226   ALBUMIN 4.4 03/25/2024 1226   AST 20 03/25/2024 1226   AST 43 (H) 08/29/2023 1111   ALT 22 03/25/2024 1226   ALT 47 (H) 08/29/2023 1111   ALKPHOS 108 03/25/2024 1226   BILITOT 0.6 03/25/2024 1226   BILITOT 0.4 08/29/2023 1111   GFRNONAA >60 08/29/2023 1111   GFRAA 85 05/26/2020 1232   GFRAA >60 08/12/2019 1035   Lab Results  Component Value Date   CHOL 115 12/25/2023   HDL 50 12/25/2023   LDLCALC 52 12/25/2023   TRIG 60 12/25/2023   CHOLHDL 2.3 12/25/2023   Lab Results  Component Value Date   HGBA1C 6.2 (H) 03/25/2024   Lab Results  Component  Value Date   VITAMINB12 766 12/25/2023   Lab Results  Component Value Date   TSH 1.910 12/25/2023    11/13/22 MRI brain [I reviewed images myself and agree with interpretation. -VRP]  1.  No evidence of an acute intracranial abnormality. 2. Mild chronic small vessel ischemic changes within the cerebral white matter and pons, similar to the prior brain MRI of 03/21/2021. 3. Mild cerebellar atrophy.   ASSESSMENT AND PLAN  78 y.o. year old female here with:  Dx:  1. Mild dementia without behavioral disturbance, psychotic disturbance, mood  disturbance, or anxiety, unspecified dementia type (HCC)     PLAN:  MILD DEMENTIA (likely alzheimer's dementia; onset ~2021) - increase memantine  to 10mg  twice a day - try to stay active physically and get some exercise (at least 15-30 minutes per day) - eat a nutritious diet with lean protein, plants / vegetables, whole grains; avoid ultra-processed foods - increase social activities, brain stimulation, games, puzzles, hobbies, crafts, arts, music; try new activities; keep it fun! - aim for at least 7-8 hours sleep per night (or more) - avoid smoking and alcohol - caution with medications, finances; no driving - safety / supervision issues reviewed - caregiver resources provided (including [employer], [employer], senior resources of [employer])  Return for return to referring provider, pending if symptoms worsen or fail to improve.  I spent 40 minutes of face-to-face and non-face-to-face time with patient.  This included previsit chart review, lab review, study review, order entry, electronic health record documentation, patient education.     EDUARD FABIENE HANLON, MD 05/11/2024, 12:13 PM Certified in Neurology, Neurophysiology and Neuroimaging  [facility] [address], [address] [place], [place] [address] [phone]

## 2024-05-18 ENCOUNTER — Ambulatory Visit: Payer: Medicare PPO | Admitting: Internal Medicine

## 2024-05-18 NOTE — Progress Notes (Signed)
 " Subjective:    Patient ID: Mariah Burns, female    DOB: [birth year], 78 y.o.   MRN: [id number]  HPI female never smoker followed for OSA, Sarcoid, complicated by HBP, DM NPSG 03/08/2010  AHI 19.8/hr  Office spirometry 09/20/15- WNL-FEV1/FVC 0.85 HST 12/24/19- AHI 12.1/ hr, desaturation to 88%, bodyweight 150 lbs ==============================================================================================================    05/16/23-78 year old female never smoker followed for OSA, Sarcoid, complicated by HBP, dCHF, DM2, Cancer R breast XRT, CPAP auto 4-10/ [facility] Download-compliance 100%, AHI 2/hr Body weight today-144 lbs Followed by Rheumatology for arthrit CXR 07/24/22- IMPRESSION: 1. No acute cardiopulmonary process. No significant change from prior. 2. Chronic bilateral interstitial thickening, presumably secondary to the patient's history of pulmonary fibrosis and interstitial scarring.  Discussed the use of AI scribe software for clinical note transcription with the patient, who gave verbal consent to proceed.  History of Present Illness   The patient, with a history of sarcoidosis, breast cancer, and heart failure, presents with discomfort from her CPAP machine. She reports that the mask is usually the issue and that it covers both her nose and mouth. She has been receiving supplies, including extra masks, from a new home care company, [employer], after her previous company, [facility], stopped providing CPAP services. The patient's CPAP machine may be around 79 years old, but she is unsure. She also mentions occasional coughing and phlegm, which she attributes to moving into a new house.  The patient's history of sarcoidosis is thought to be resolved, and there has been no recurrence of her breast cancer. She had an episode related to her heart failure, but hospital evaluation found no issues. She is also followed by rheumatology for arthritis.     Assessment and Plan     Obstructive Sleep Apnea Patient reports discomfort with current CPAP mask and inconsistent use. No issues reported with the machine itself. -Request [facility] to refit patient's CPAP mask. -Consider replacement of CPAP machine if it is over 14 years old.  Sarcoidosis History of sarcoidosis with residual lung scarring. Patient reports occasional cough and phlegm, but no wheezing or rattling noted on examination. -Order annual chest x-ray to monitor for any changes.  Breast Cancer No recurrence reported. -Continue current follow-up plan.  Heart Failure No recent issues reported. Recent hospital visit did not reveal any cardiac issues. -Continue current management plan.  Arthritis Managed by Rheumatology. -Continue current management plan.    05/19/24- 78 year old female never smoker followed for OSA, Sarcoid, complicated by HBP, dCHF, DM2, Cancer R breast XRT, CPAP auto 4-10/ [facility] Download-compliance 93%, AHI 2.8/hr Body weight today-155 lbs Followed by Rheumatology for arthritis -----CPAP is good.  Sleeping well.  Will need a different size mask. Discussed the use of AI scribe software for clinical note transcription with the patient, who gave verbal consent to proceed.  History of Present Illness   Mariah Burns is a 78 year old female with obstructive sleep apnea who presents for CPAP follow-up.  She is comfortable with her current CPAP settings with fewer than three breakthrough apneas per hour. Her CPAP machine is about 79 years old and she is eligible for a replacement. She asks about vaccinations. She has not received a flu shot this fall, has received a COVID vaccine, and is undecided about the RSV vaccine.     CXR 05/16/23 MPRESSION: Similar appearance of the chest x-ray, with evidence of mild fibrosis/scarring and compatible with the given history of sarcoid. No radiographic evidence of superimposed acute cardiopulmonary disease  Assessment and Plan:     Obstructive sleep apnea Well-managed with CPAP therapy. Current settings effective with <3 apneas/hour. Mask discomfort noted. CPAP machine eligible for replacement. - Ordered new CPAP mask for proper fit. - Arranged replacement CPAP machine, maintaining current settings. - Educated on CPAP therapy's role in preventing complications.   Vaccination status - discussed     Review of Systems-see HPI  + = positive Constitutional:   No-   weight loss, night sweats, fevers, chills, +fatigue, lassitude. HEENT:   No-  headaches, difficulty swallowing, tooth/dental problems, sore throat,       No-  sneezing, itching, ear ache, nasal congestion, post nasal drip,  CV:  No-   chest pain, orthopnea, PND, swelling in lower extremities, anasarca, dizziness, palpitations Resp: +shortness of breath with exertion or at rest.              No-   productive cough,  No non-productive cough,  No- coughing up of blood.              No-   change in color of mucus.  No- wheezing.   Skin: No-   rash or lesions. GI:  No-   heartburn, indigestion, abdominal pain, nausea, vomiting,  GU:  MS:  No-   joint pain or swelling.   Neuro-     nothing unusual Psych:  No- change in mood or affect. No depression or anxiety.  No memory loss.  Objective:   Physical Exam    General- Alert, Oriented, Affect-appropriate, Distress- none acute; +medium build Skin- rash-none, lesions- none, excoriation- none Lymphadenopathy- none Head- atraumatic            Eyes- Gross vision intact, PERRLA, conjunctivae clear secretions            Ears- Hearing, canals-normal            Nose- Clear, no-Septal dev, mucus, polyps, erosion, perforation             Throat- Mallampati III , mucosa- not very dry , drainage- none, tonsils- atrophic Neck- flexible , trachea midline, no stridor , thyroid  nl, carotid no bruit Chest - symmetrical excursion , unlabored           Heart/CV- RRR ,  Murmur+1-2/6 systolic AS , no gallop  , no rub, nl s1 s2                            - JVD- none , edema- none, stasis changes- none, varices- none           Lung- clear to P&A, wheeze- none, cough- none , dullness-none, rub- none           Chest wall-  Abd-  Br/ Gen/ Rectal- Not done, not indicated Extrem- cyanosis- none, clubbing, none, atrophy- none, strength- nl Neuro- grossly intact to observation     "

## 2024-05-19 ENCOUNTER — Ambulatory Visit: Admitting: Internal Medicine

## 2024-05-19 ENCOUNTER — Encounter: Payer: Self-pay | Admitting: Internal Medicine

## 2024-05-19 VITALS — BP 130/58 | HR 63 | Temp 97.4°F | Ht 59.0 in | Wt 155.6 lb

## 2024-05-19 DIAGNOSIS — G4733 Obstructive sleep apnea (adult) (pediatric): Secondary | ICD-10-CM | POA: Diagnosis not present

## 2024-05-19 NOTE — Patient Instructions (Signed)
 Order- DME [facility]- please arrange a face to face visit to refit mask of choice. Please replace old CPAP machine, Continue auto 4-10, humidifier, supplies, AirView/ card.  At check out, ask front desk to bring you back next year with one of our sleep doctors.

## 2024-06-02 ENCOUNTER — Telehealth: Payer: Self-pay

## 2024-06-02 NOTE — Telephone Encounter (Signed)
 Request for signed documentation faxed to [facility]

## 2024-06-26 ENCOUNTER — Other Ambulatory Visit: Payer: Self-pay | Admitting: Internal Medicine

## 2024-06-30 ENCOUNTER — Ambulatory Visit: Payer: Self-pay | Admitting: Internal Medicine

## 2024-07-01 ENCOUNTER — Encounter: Payer: Self-pay | Admitting: Internal Medicine

## 2024-07-01 ENCOUNTER — Ambulatory Visit: Admitting: Internal Medicine

## 2024-07-01 VITALS — BP 122/70 | HR 57 | Temp 98.8°F | Ht 59.0 in | Wt 158.4 lb

## 2024-07-01 DIAGNOSIS — E66811 Obesity, class 1: Secondary | ICD-10-CM | POA: Diagnosis not present

## 2024-07-01 DIAGNOSIS — E7800 Pure hypercholesterolemia, unspecified: Secondary | ICD-10-CM

## 2024-07-01 DIAGNOSIS — Z6831 Body mass index (BMI) 31.0-31.9, adult: Secondary | ICD-10-CM | POA: Diagnosis not present

## 2024-07-01 DIAGNOSIS — E6609 Other obesity due to excess calories: Secondary | ICD-10-CM | POA: Diagnosis not present

## 2024-07-01 DIAGNOSIS — G309 Alzheimer's disease, unspecified: Secondary | ICD-10-CM | POA: Diagnosis not present

## 2024-07-01 DIAGNOSIS — I130 Hypertensive heart and chronic kidney disease with heart failure and stage 1 through stage 4 chronic kidney disease, or unspecified chronic kidney disease: Secondary | ICD-10-CM

## 2024-07-01 DIAGNOSIS — R10A1 Flank pain, right side: Secondary | ICD-10-CM | POA: Diagnosis not present

## 2024-07-01 DIAGNOSIS — M8589 Other specified disorders of bone density and structure, multiple sites: Secondary | ICD-10-CM

## 2024-07-01 DIAGNOSIS — N182 Chronic kidney disease, stage 2 (mild): Secondary | ICD-10-CM | POA: Diagnosis not present

## 2024-07-01 DIAGNOSIS — E1122 Type 2 diabetes mellitus with diabetic chronic kidney disease: Secondary | ICD-10-CM | POA: Diagnosis not present

## 2024-07-01 DIAGNOSIS — I10 Essential (primary) hypertension: Secondary | ICD-10-CM

## 2024-07-01 DIAGNOSIS — F02A0 Dementia in other diseases classified elsewhere, mild, without behavioral disturbance, psychotic disturbance, mood disturbance, and anxiety: Secondary | ICD-10-CM | POA: Diagnosis not present

## 2024-07-01 LAB — POCT URINALYSIS DIPSTICK
Bilirubin, UA: NEGATIVE
Blood, UA: NEGATIVE
Glucose, UA: POSITIVE — AB
Nitrite, UA: NEGATIVE
Protein, UA: NEGATIVE
Spec Grav, UA: 1.015
Urobilinogen, UA: 0.2 U/dL
pH, UA: 6

## 2024-07-01 MED ORDER — ATORVASTATIN CALCIUM 40 MG PO TABS: ORAL_TABLET | ORAL | 2 refills | Status: AC

## 2024-07-01 MED ORDER — AMLODIPINE BESYLATE 10 MG PO TABS: 10.0000 mg | ORAL_TABLET | Freq: Every day | ORAL | 2 refills | Status: AC

## 2024-07-01 MED ORDER — VALSARTAN 40 MG PO TABS: 40.0000 mg | ORAL_TABLET | Freq: Every day | ORAL | 1 refills | Status: AC

## 2024-07-01 NOTE — Patient Instructions (Signed)

## 2024-07-01 NOTE — Progress Notes (Signed)
 I,Mariah Burns, CMA,acting as a neurosurgeon for Mariah LOISE Slocumb, MD.,have documented all relevant documentation on the behalf of Mariah LOISE Slocumb, MD,as directed by  Mariah LOISE Slocumb, MD while in the presence of Mariah LOISE Slocumb, MD.  Subjective:  Patient ID: Mariah Burns , female    DOB: February 20, 1946 , 79 y.o.   MRN: [id number]  Chief Complaint  Patient presents with   Diabetes    The patient is here today for a diabetes and blood pressure f/u.  She reports compliance with meds. She denies headaches, chest pain and shortness of breath.   Hypertension   Hyperlipidemia    HPI Discussed the use of AI scribe software for clinical note transcription with the patient, who gave verbal consent to proceed.  History of Present Illness Mariah Burns is a 79 year old female with diabetes and hypertension who presents for a routine follow-up visit. She is accompanied by her youngest daughter.  She has no current concerns and maintains a good appetite. Her medication regimen includes atorvastatin  and ezetimibe  for cholesterol, valsartan  and amlodipine  for blood pressure, and memantine  for memory, with adherence supported by her daughter.  She experiences intermittent right side pain that is relieved by applying pressure. This pain has been present for some time.  Her physical activity is limited to walking. She has a history of osteopenia, with a bone density scan in March 2023 showing low bone mass.  She recalls seeing several specialists around Thanksgiving, including a cardiologist and a neurologist, who adjusted her medication.  No new symptoms or concerns, including changes in appetite or exercise tolerance. No issues with medication adherence.   Diabetes She presents for her follow-up diabetic visit. She has type 2 diabetes mellitus. Her disease course has been stable. Pertinent negatives for diabetes include no blurred vision. There are no hypoglycemic complications. Diabetic  complications include nephropathy. Risk factors for coronary artery disease include diabetes mellitus, dyslipidemia, hypertension, sedentary lifestyle and post-menopausal. She is compliant with treatment most of the time.  Hypertension This is a chronic problem. The current episode started more than 1 year ago. The problem has been gradually improving since onset. The problem is controlled. Pertinent negatives include no blurred vision.     Past Medical History:  Diagnosis Date   Abdominal bloating 12/14/2022   Arthritis    Chronic bilateral low back pain without sciatica 10/28/2019   Chronic diastolic heart failure 06/06/2021   Class 1 obesity due to excess calories with serious comorbidity and body mass index (BMI) of 30.0 to 30.9 in adult 10/28/2019   Cyst of right breast 01/30/2017   1 cm     Dry mouth 11/19/2020   She is encouraged to increase fluid intake and to consider use of Biotene products. Also advised to increase her intake of healthy fats - walnuts, avocado etc.      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 Daughter verbally acknowledges  treatment plan.     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 We discussed the use of Trintellix, she declines at this time.      Stage 2 chronic kidney disease 05/15/2018   Type 2 diabetes mellitus, without long-term current use of insulin 05/06/2010   Vitamin D  deficiency 10/28/2019     Family History  Problem Relation Age of Onset   Allergies Mother    Heart failure Father    Allergies Sister    Heart disease Sister    Lupus Sister    Dementia Sister    Heart disease Sister    Gout Brother    Hypertension Brother    Heart disease Brother    Gout Brother    Hypertension Brother    Hypertension Brother    Hypertension Brother    Post-traumatic stress disorder Brother    Stroke Maternal Grandmother    Allergies Daughter    Healthy Daughter    Healthy Daughter    Seizures Neg Hx     Current Medications[1]   Allergies[2]   Review of Systems  Constitutional: Negative.   Eyes:  Negative for blurred vision.  Respiratory: Negative.    Cardiovascular: Negative.   Neurological: Negative.   Psychiatric/Behavioral: Negative.       Today's Vitals   07/01/24 1408  BP: 122/70  Pulse: (!) 57  Temp: 98.8 F (37.1 C)  SpO2: 98%  Weight: 158 lb 6.4 oz (71.8 kg)  Height: 4' 11 (1.499 m)   Body mass index is 31.99 kg/m.  Wt Readings from Last 3 Encounters:  07/01/24 158 lb 6.4 oz (71.8 kg)   05/19/24 155 lb 9.6 oz (70.6 kg)  05/11/24 160 lb (72.6 kg)    The ASCVD Risk score (Arnett DK, et al., 2019) failed to calculate for the following reasons:   The valid total cholesterol range is 130 to 320 mg/dL  Objective:  Physical Exam Vitals and nursing note reviewed.  Constitutional:      Appearance: Normal appearance. She is obese.  HENT:     Head: Normocephalic and atraumatic.  Eyes:     Extraocular Movements: Extraocular movements intact.  Cardiovascular:     Rate and Rhythm: Normal rate and regular rhythm.     Heart sounds: Normal heart sounds.  Pulmonary:     Effort: Pulmonary effort is normal.     Breath sounds: Normal breath sounds.  Musculoskeletal:     Cervical back: Normal range of motion.  Skin:    General: Skin is warm.  Neurological:     General: No focal deficit present.  Mental Status: She is alert.  Psychiatric:        Mood and Affect: Mood normal.        Behavior: Behavior normal.        Assessment And Plan:   Assessment & Plan Type 2 diabetes mellitus with stage 2 chronic kidney disease, without long-term current use of insulin (HCC) Diabetes management is ongoing with Jardiance , which also provides renal protection. - Continue Jardiance  for diabetes and renal protection. - Ordered A1c test. Hypertensive heart and renal disease with renal failure, stage 1 through stage 4 or unspecified chronic kidney disease, with heart failure (HCC) Chronic, well controlled.  Blood pressure is managed with valsartan  and amlodipine . Cardiologist follow-up is scheduled for next year with an echocardiogram and ultrasound. - Continue valsartan  and amlodipine  for blood pressure and renal protection. - Follow up with cardiologist next year for echocardiogram and ultrasound. - Follow low sodium diet.  Pure hypercholesterolemia Cholesterol is managed with atorvastatin  and ezetimibe . - Continue atorvastatin  and ezetimibe  for cholesterol management. 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 Neurologist recommended increased exercise to improve circulation and cognitive function. - Continue memantine  for memory management. - Encouraged regular exercise to improve circulation and cognitive function. Right flank pain Intermittent right flank pain, possibly related to gas. Pain is relieved by applying pressure. - Obtained urine sample to rule out urinary causes of pain. Class 1 obesity due to excess calories with serious comorbidity and body mass index (BMI) of 31.0 to 31.9 in adult She is encouraged to strive for BMI less than 30 to decrease cardiac risk. Advised to aim for at least 150 minutes of exercise per week.  Osteopenia of multiple sites Exercise is recommended to improve bone density. - Ordered bone density scan at [facility]. - Encouraged weight-bearing exercises with light weights. General Health Maintenance Routine health maintenance is ongoing. Mammogram was completed in June. Bone density scan is due. - Ordered bone density scan at [facility]. - Encouraged regular exercise during TV commercials.   Orders Placed This Encounter  Procedures   CMP14+EGFR   Hemoglobin A1c   POCT Urinalysis Dipstick (81002)   Return for 4 month dm f/u.SABRA  Patient was given opportunity to ask questions. Patient verbalized understanding of the plan and was able to repeat key elements of the plan. All questions were answered to their satisfaction.    I, Mariah LOISE Slocumb, MD, have reviewed all documentation for this visit. The documentation on 07/01/24 for the exam, diagnosis, procedures, and orders are all accurate and complete.   IF YOU HAVE BEEN REFERRED TO A SPECIALIST, IT MAY TAKE 1-2 WEEKS TO SCHEDULE/PROCESS THE REFERRAL. IF YOU HAVE NOT HEARD FROM US /SPECIALIST IN TWO WEEKS, PLEASE GIVE US  A CALL AT  [phone].       [1]  Current Outpatient Medications:    ascorbic acid (VITAMIN C) 500 MG tablet, Take 500 mg by mouth daily., Disp: , Rfl:    aspirin  EC 81 MG tablet, Take 81 mg by mouth daily. Swallow whole., Disp: , Rfl:    Cholecalciferol (VITAMIN D -3 PO), Take 5,000 Units by mouth daily., Disp: , Rfl:    empagliflozin  (JARDIANCE ) 10 MG TABS tablet, Take 1 tablet (10 mg total) by mouth daily before breakfast., Disp: 90 tablet, Rfl: 2   ezetimibe  (ZETIA ) 10 MG tablet, TAKE 1 TABLET BY MOUTH EVERY DAY, Disp: 90 tablet, Rfl: 3   memantine  (  NAMENDA ) 5 MG tablet, TAKE 1 TABLET (5 MG AT NIGHT) FOR 2 WEEKS, THEN INCREASE TO 1 TABLET (5 MG) TWICE A DAY, Disp: 180 tablet, Rfl: 1   Menthol, Topical Analgesic, (BIOFREEZE ROLL-ON EX), Apply 1 application. topically as needed (pain in back,neck,thumb)., Disp: , Rfl:    nitroGLYCERIN  (NITROSTAT ) 0.4 MG SL tablet, Place 1 tablet (0.4 mg total) under the tongue every 5 (five) minutes as needed for chest pain., Disp: 25 tablet, Rfl: 2   Polyvinyl Alcohol-Povidone PF 1.4-0.6 % SOLN, Place 1 drop into both eyes daily as needed (dry eyes). , Disp: , Rfl:    RESTASIS 0.05 % ophthalmic emulsion, as needed (dry eyes)., Disp: , Rfl:    amLODipine  (NORVASC ) 10 MG tablet, Take 1 tablet (10 mg total) by mouth daily., Disp: 90 tablet, Rfl: 2   atorvastatin  (LIPITOR) 40 MG tablet, TAKE 1 TABLET BY MOUTH DAILY MONDAY THROUGH FRIDAY. SKIP WEEKENDS, Disp: 60 tablet, Rfl: 2   valsartan  (DIOVAN ) 40 MG tablet, Take 1 tablet (40 mg total) by mouth daily., Disp: 90 tablet, Rfl: 1  Current Facility-Administered Medications:    0.9 %  sodium chloride  infusion, , Intravenous, PRN, Georgina Speaks, FNP [2] Allergies Allergen Reactions   Fosamax [Alendronate Sodium] Other (See Comments)    REACTION: CAUSE GI UPSET AND FATIGUE   Omeprazole Other (See Comments)    Lower ab pain   Motrin [Ibuprofen] Other (See Comments)    GI upset- has to eat prior  to taking   Fluvastatin Other (See Comments)    mild memoryproblems of confusion

## 2024-07-02 LAB — CMP14+EGFR
ALT: 18 [IU]/L (ref 0–32)
AST: 22 [IU]/L (ref 0–40)
Albumin: 4.2 g/dL (ref 3.8–4.8)
Alkaline Phosphatase: 100 [IU]/L (ref 49–135)
BUN/Creatinine Ratio: 11 — ABNORMAL LOW (ref 12–28)
BUN: 11 mg/dL (ref 8–27)
Bilirubin Total: 0.4 mg/dL (ref 0.0–1.2)
CO2: 21 mmol/L (ref 20–29)
Calcium: 9.8 mg/dL (ref 8.7–10.3)
Chloride: 106 mmol/L (ref 96–106)
Creatinine, Ser: 1.03 mg/dL — ABNORMAL HIGH (ref 0.57–1.00)
Globulin, Total: 3.1 g/dL (ref 1.5–4.5)
Glucose: 87 mg/dL (ref 70–99)
Potassium: 4.3 mmol/L (ref 3.5–5.2)
Sodium: 142 mmol/L (ref 134–144)
Total Protein: 7.3 g/dL (ref 6.0–8.5)
eGFR: 55 mL/min/{1.73_m2} — ABNORMAL LOW

## 2024-07-02 LAB — HEMOGLOBIN A1C
Est. average glucose Bld gHb Est-mCnc: 137 mg/dL
Hgb A1c MFr Bld: 6.4 % — ABNORMAL HIGH (ref 4.8–5.6)

## 2024-07-05 ENCOUNTER — Ambulatory Visit: Payer: Self-pay | Admitting: Internal Medicine

## 2024-07-05 NOTE — Assessment & Plan Note (Signed)
 Memory is managed with memantine . Neurologist recommended increased exercise to improve circulation and cognitive function. - Continue memantine  for memory management. - Encouraged regular exercise to improve circulation and cognitive function.

## 2024-07-05 NOTE — Assessment & Plan Note (Signed)
 Diabetes management is ongoing with Jardiance , which also provides renal protection. - Continue Jardiance  for diabetes and renal protection. - Ordered A1c test.

## 2024-07-05 NOTE — Assessment & Plan Note (Signed)
 Exercise is recommended to improve bone density. - Ordered bone density scan at [facility]. - Encouraged weight-bearing exercises with light weights.

## 2024-07-05 NOTE — Assessment & Plan Note (Signed)
 She is encouraged to strive for BMI less than 30 to decrease cardiac risk. Advised to aim for at least 150 minutes of exercise per week.

## 2024-07-05 NOTE — Assessment & Plan Note (Signed)
 Cholesterol is managed with atorvastatin  and ezetimibe . - Continue atorvastatin  and ezetimibe  for cholesterol management.

## 2024-08-26 ENCOUNTER — Ambulatory Visit: Admitting: Internal Medicine

## 2024-08-28 ENCOUNTER — Ambulatory Visit: Admitting: Nurse Practitioner

## 2024-08-28 ENCOUNTER — Other Ambulatory Visit

## 2024-10-29 ENCOUNTER — Ambulatory Visit: Payer: Self-pay | Admitting: Internal Medicine

## 2025-01-20 ENCOUNTER — Ambulatory Visit: Payer: Self-pay

## 2025-03-11 ENCOUNTER — Ambulatory Visit: Admitting: Rheumatology

## 2025-04-01 ENCOUNTER — Encounter: Payer: Self-pay | Admitting: Internal Medicine
